# Patient Record
Sex: Female | Born: 1965 | Race: White | Hispanic: No
Health system: Western US, Academic
[De-identification: ages and names within clinical notes are randomized; demographics above are authoritative.]

## PROBLEM LIST (undated history)

## (undated) DIAGNOSIS — IMO0001 Reserved for inherently not codable concepts without codable children: Secondary | ICD-10-CM

## (undated) DIAGNOSIS — M159 Polyosteoarthritis, unspecified: Secondary | ICD-10-CM

## (undated) DIAGNOSIS — F411 Generalized anxiety disorder: Secondary | ICD-10-CM

## (undated) DIAGNOSIS — G35 Multiple sclerosis: Secondary | ICD-10-CM

## (undated) DIAGNOSIS — G4730 Sleep apnea, unspecified: Secondary | ICD-10-CM

## (undated) DIAGNOSIS — K219 Gastro-esophageal reflux disease without esophagitis: Secondary | ICD-10-CM

## (undated) DIAGNOSIS — F32A Depression, unspecified: Secondary | ICD-10-CM

## (undated) DIAGNOSIS — T4145XA Adverse effect of unspecified anesthetic, initial encounter: Secondary | ICD-10-CM

## (undated) DIAGNOSIS — M797 Fibromyalgia: Secondary | ICD-10-CM

## (undated) DIAGNOSIS — J449 Chronic obstructive pulmonary disease, unspecified: Secondary | ICD-10-CM

## (undated) DIAGNOSIS — M1990 Unspecified osteoarthritis, unspecified site: Secondary | ICD-10-CM

## (undated) DIAGNOSIS — G4700 Insomnia, unspecified: Secondary | ICD-10-CM

## (undated) DIAGNOSIS — R413 Other amnesia: Secondary | ICD-10-CM

## (undated) DIAGNOSIS — I2699 Other pulmonary embolism without acute cor pulmonale: Secondary | ICD-10-CM

## (undated) DIAGNOSIS — F419 Anxiety disorder, unspecified: Secondary | ICD-10-CM

## (undated) DIAGNOSIS — T8859XA Other complications of anesthesia, initial encounter: Secondary | ICD-10-CM

## (undated) DIAGNOSIS — Z9889 Other specified postprocedural states: Secondary | ICD-10-CM

## (undated) DIAGNOSIS — F3181 Bipolar II disorder: Secondary | ICD-10-CM

## (undated) DIAGNOSIS — R112 Nausea with vomiting, unspecified: Secondary | ICD-10-CM

## (undated) DIAGNOSIS — K5190 Ulcerative colitis, unspecified, without complications: Secondary | ICD-10-CM

## (undated) HISTORY — PX: SURGICAL HX OTHER: 99

## (undated) HISTORY — PX: PR TONSILLECTOMY ONE-HALF <AGE 12: 42825

## (undated) HISTORY — PX: PR UNLISTED PROCEDURE HANDS/FINGERS: 26989

## (undated) HISTORY — DX: Reserved for inherently not codable concepts without codable children: IMO0001

## (undated) HISTORY — DX: Polyosteoarthritis, unspecified: M15.9

## (undated) HISTORY — PX: CORRJ HALLUX VALGUS W/WO SESMDC SMPL EXOSTECTOMY: 28290

## (undated) HISTORY — DX: Generalized anxiety disorder: F41.1

## (undated) HISTORY — PX: TONSILLECTOMY AND ADENOIDECTOMY: SUR1326

## (undated) HISTORY — DX: Multiple sclerosis: G35

## (undated) HISTORY — PX: FOOT SURGERY: SHX648

## (undated) HISTORY — DX: Anxiety disorder, unspecified: F41.9

## (undated) HISTORY — PX: HUMERUS FRACTURE SURGERY: SHX670

## (undated) HISTORY — DX: Other pulmonary embolism without acute cor pulmonale: I26.99

## (undated) HISTORY — DX: Insomnia, unspecified: G47.00

## (undated) HISTORY — PX: THYROID SURGERY: SHX805

## (undated) HISTORY — PX: NASAL SINUS SURGERY: SHX719

## (undated) HISTORY — PX: THUMB ARTHROSCOPY: SHX2509

## (undated) HISTORY — PX: MYOMECTOMY: SHX85

## (undated) HISTORY — PX: HEMORRHOID SURGERY: SHX153

## (undated) HISTORY — DX: Bipolar II disorder: F31.81

---

## 1998-11-09 DIAGNOSIS — I2699 Other pulmonary embolism without acute cor pulmonale: Secondary | ICD-10-CM

## 1998-11-09 HISTORY — DX: Other pulmonary embolism without acute cor pulmonale: I26.99

## 2006-08-04 ENCOUNTER — Encounter: Payer: No Typology Code available for payment source | Admitting: Rehabilitative and Restorative Service Providers"

## 2006-08-23 ENCOUNTER — Encounter: Payer: No Typology Code available for payment source | Admitting: Rehabilitative and Restorative Service Providers"

## 2006-08-30 ENCOUNTER — Encounter: Payer: Self-pay | Admitting: Rehabilitative and Restorative Service Providers"

## 2006-09-29 ENCOUNTER — Encounter: Payer: Self-pay | Admitting: Rehabilitative and Restorative Service Providers"

## 2006-10-25 ENCOUNTER — Encounter: Payer: Self-pay | Admitting: Rehabilitative and Restorative Service Providers"

## 2006-10-29 ENCOUNTER — Encounter: Payer: No Typology Code available for payment source | Admitting: Rehabilitative and Restorative Service Providers"

## 2006-11-22 ENCOUNTER — Encounter: Payer: No Typology Code available for payment source | Admitting: Rehabilitative and Restorative Service Providers"

## 2006-12-02 ENCOUNTER — Encounter: Payer: No Typology Code available for payment source | Admitting: Rehabilitative and Restorative Service Providers"

## 2006-12-16 ENCOUNTER — Encounter: Payer: Self-pay | Admitting: Rehabilitative and Restorative Service Providers"

## 2006-12-30 ENCOUNTER — Encounter: Payer: Self-pay | Admitting: Rehabilitative and Restorative Service Providers"

## 2007-01-13 ENCOUNTER — Encounter (HOSPITAL_BASED_OUTPATIENT_CLINIC_OR_DEPARTMENT_OTHER): Payer: No Typology Code available for payment source | Admitting: Rehabilitative and Restorative Service Providers"

## 2007-01-27 ENCOUNTER — Encounter (HOSPITAL_BASED_OUTPATIENT_CLINIC_OR_DEPARTMENT_OTHER): Payer: No Typology Code available for payment source | Admitting: Rehabilitative and Restorative Service Providers"

## 2007-02-08 ENCOUNTER — Encounter (HOSPITAL_BASED_OUTPATIENT_CLINIC_OR_DEPARTMENT_OTHER): Payer: No Typology Code available for payment source | Admitting: Rehabilitative and Restorative Service Providers"

## 2007-02-10 ENCOUNTER — Encounter (HOSPITAL_BASED_OUTPATIENT_CLINIC_OR_DEPARTMENT_OTHER): Payer: No Typology Code available for payment source | Admitting: Rehabilitative and Restorative Service Providers"

## 2007-02-24 ENCOUNTER — Encounter (HOSPITAL_BASED_OUTPATIENT_CLINIC_OR_DEPARTMENT_OTHER): Payer: No Typology Code available for payment source | Admitting: Rehabilitative and Restorative Service Providers"

## 2007-03-10 ENCOUNTER — Encounter (HOSPITAL_BASED_OUTPATIENT_CLINIC_OR_DEPARTMENT_OTHER): Payer: No Typology Code available for payment source | Admitting: Rehabilitative and Restorative Service Providers"

## 2007-03-24 ENCOUNTER — Encounter (HOSPITAL_BASED_OUTPATIENT_CLINIC_OR_DEPARTMENT_OTHER): Payer: No Typology Code available for payment source | Admitting: Rehabilitative and Restorative Service Providers"

## 2007-04-07 ENCOUNTER — Encounter (HOSPITAL_BASED_OUTPATIENT_CLINIC_OR_DEPARTMENT_OTHER): Payer: No Typology Code available for payment source | Admitting: Rehabilitative and Restorative Service Providers"

## 2009-12-18 ENCOUNTER — Ambulatory Visit
Payer: Medicaid Other | Attending: Rehabilitative and Restorative Service Providers" | Admitting: Rehabilitative and Restorative Service Providers"

## 2009-12-18 ENCOUNTER — Ambulatory Visit (HOSPITAL_BASED_OUTPATIENT_CLINIC_OR_DEPARTMENT_OTHER): Payer: Medicaid Other | Admitting: Orthopedics Med

## 2009-12-18 ENCOUNTER — Encounter (HOSPITAL_BASED_OUTPATIENT_CLINIC_OR_DEPARTMENT_OTHER): Payer: Self-pay | Admitting: Rehabilitative and Restorative Service Providers"

## 2009-12-18 VITALS — BP 142/85 | HR 87

## 2009-12-18 DIAGNOSIS — S62639A Displaced fracture of distal phalanx of unspecified finger, initial encounter for closed fracture: Secondary | ICD-10-CM | POA: Insufficient documentation

## 2009-12-18 DIAGNOSIS — M79609 Pain in unspecified limb: Secondary | ICD-10-CM | POA: Insufficient documentation

## 2009-12-18 DIAGNOSIS — IMO0002 Reserved for concepts with insufficient information to code with codable children: Secondary | ICD-10-CM | POA: Insufficient documentation

## 2009-12-18 DIAGNOSIS — M19049 Primary osteoarthritis, unspecified hand: Secondary | ICD-10-CM

## 2009-12-18 LAB — PROTHROMBIN & PTT
Partial Thromboplastin Time: 28 s (ref 22–35)
Partial Thromboplastin X Mean: 1
Prothrombin INR: 0.9 (ref 0.8–1.3)
Prothrombin Time Patient: 12.4 s (ref 10.7–15.6)

## 2009-12-18 LAB — BASIC METABOLIC PANEL
Anion Gap: 8 (ref 3–11)
Calcium: 9.8 mg/dL (ref 8.9–10.2)
Carbon Dioxide, Total: 25 mEq/L (ref 22–32)
Chloride: 106 mEq/L (ref 98–108)
Creatinine: 1 mg/dL (ref 0.2–1.1)
GFR, Calc, African American: >60 mL/min (ref >59)
GFR, Calc, European American: >60 mL/min (ref >59)
Glucose: 100 mg/dL (ref 62–125)
Potassium: 4.1 mEq/L (ref 3.7–5.2)
Sodium: 139 mEq/L (ref 136–145)
Urea Nitrogen: 9 mg/dL (ref 8–21)

## 2009-12-18 LAB — CBC (HEMOGRAM)
Hematocrit: 45 % (ref 36–45)
Hemoglobin: 14.5 g/dL (ref 11.5–15.5)
MCH: 27.8 pg (ref 27.3–33.6)
MCHC: 32.3 g/dL (ref 32.2–36.5)
MCV: 86 fL (ref 81–98)
Platelet Count: 331 10*3/uL (ref 150–400)
RBC: 5.22 mil/uL — ABNORMAL HIGH (ref 3.80–5.00)
RDW-CV: 14.2 % (ref 11.6–14.4)
WBC: 10.4 10*3/uL — ABNORMAL HIGH (ref 4.3–10.0)

## 2009-12-18 MED ORDER — HYDROCODONE-ACETAMINOPHEN 5-500 MG OR TABS
ORAL_TABLET | ORAL | Status: DC
Start: 2009-12-18 — End: 2010-03-07

## 2009-12-18 MED ORDER — HYDROCODONE-ACETAMINOPHEN 5-500 MG OR TABS
ORAL_TABLET | ORAL | Status: DC
Start: 2009-12-18 — End: 2009-12-18

## 2009-12-18 NOTE — Progress Notes (Signed)
Chief Complaint   Patient presents with   . Finger Pain     lft RF horse rope injury 11/30/09             Dear Dr. Mardene Speak, Julien Nordmann,    I had the pleasure of seeing your patient, Rebecca Nichols at [facility] today on 12/18/2009 in consultation for evaluation and treatment of left RF pain and fracture.    As you know,  Rebecca Nichols is a 44 year old RHD female who is disabled since 2008.  She presents with a chief complaint of pain in the left ring finger that has been ongoing for 18 days. Patient sustained an injury to the left RF while in [place]. She was leading a horse when the horse lurched and the rope hit her finger.  She went to [facility] where x-rays were taken and a left RF middle phalanx comminuted fracture was found.  The physician there states she would need surgery, but he did not want to do the surgery with her coming back to [place] the next week.  She was splinted and told to follow up here when she returned to [place].  She states she has mild decreased sensation over the ulnar and radial aspect of the left RF.      She also mentions that she is having pain and decreased function with her bilateral thumbs.  She notes that she has had bilateral thumb CMC arthroplasties, then revisions bilateral, as well as bilateral thumb MCP fusions over the past 2 years. Her bilateral thumbs CMC joint has gotten painful again and are not in a position that is functional for the patient.  She is wondering if she can have her left thumb revised if she needs surgery on her left RF.    She currently rates her pain as 8 out of 10 that is Throbbing, Stabbing, Sharp and Dull. Pain is aggravated by activity, cold, and pressure. The pain has come on suddenly.  Sensation is mildly decreased in the left RF.  She notes a decrease in strength . She currently rates her strength as 1 out of 10.     Past Medical History:  Past Medical History   Diagnosis Date   .  ANXIETY STATE NOS    . MYALGIA AND MYOSITIS NOS    . PSORIASIS/LIKE DISORDERS*    . GENERAL OSTEOARTHROSIS          Medications:     Current outpatient prescriptions   Medication Sig   . ALPRAZolam 0.25 MG OR TABS prn   . Aripiprazole (ABILIFY) 10 MG OR TABS 1qd   . Glatiramer Acetate (COPAXONE) 20 MG/ML SC KIT daily   . HYDROCODONE-APAP (VICODIN) 5-500 MG OR TABS one tablet p.o. q 4-6 hours prn for pain   . Ibuprofen 800 MG OR TABS tid   . Mesalamine (ASACOL) 400 MG OR TBEC 3 tabs  tid   . Oxycodone-Acetaminophen 10-325 MG OR TABS prn   . Pregabalin (LYRICA OR) unknown dose qd   . TraZODone HCl 50 MG OR TABS prn           Allergies:   Review of patient's allergies indicates:  Allergies   Allergen Reactions   . Papaya And Enzymes Throat Swelling           Past Surgical History:  Past Surgical History   Procedure Date   . Removal of tonsils    .  Bilateral thumb cmc arthroplasties    . Fibroid cysts    . Correct bunion,simple          Family History:  Non-contributory    Social History:   The patient states her problem is not work related.  She is divorced.     History   Alcohol Use   . Yes     occasional         History   Tobacco Use   . Yes -- 0.5 packs/day for 20 years           ROS:   Positive for   Muscle/Bones:  joint pain, muscle pain and fractures  Skin:  psoriasis  Mental Health:  anxiety  . All other systems of the 14 reviewed are negative.     Hand & Upper Extremity Examination    Physical Examination  Gen: Patient is healthy, alert, no distress    Psych: Alert and oriented times 3  Pleasant female. Mood and affect appropriate.    Skin:   warm, normal  sweat patterns.  no abrasions or lacerations.  ecchymosis over left RF and into dorsal left hand    No palpable masses    Vascular: Fingers warm, pink, with brisk capillary refill    Neurologic: Sensation to light touch is decreased over the left ring finger tip.    Musculoskeletal:    Inspection of the  bilateral upper extremities shows no gross deformity or  evidence of atrophy.  Mild swelling over the left RF into dorsal hand.  Tender over left RF middle phalanx, left thumb CMC joint, and right thumb CMC joint.  Crepitus over [facility] joint  bilateral thumb.  Positive CMC grind  bilateral thumb.  Bilateral thumb have a adducted posture to them at rest.  Patient unable to full abduct the thumb.    Symmetric full wrist flexion/extension  Mild radial scissoring noted with left RF  Bilateral thumbs in adducted position.  Full extension and opposition of bilateral thumbs.  No DRUJ instability.  5/5 strength WE, WF      Studies: X-rays of the left RF were reviewed and show a middle phalanx comminuted spiral fracture with shortening and mild volar displacement.      Assessment:   1. Left RF middle phalanx comminuted spiral fracture  2. 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    Plan:  Patient was told that with her comminution and scissoring seen we are recommending CRPP vs ORIF of her left RF middle phalanx.  She agreed with the plan and inquired about possibly revising her left thumb CMC arthroplasty at the same time.  She was told the splinting and therapy protocols for those two procedures are very different and Dr. Renaldo Reel would probably not want to do both at the same time.  She was told I would run it past him and we could add it on day of surgery if he wanted to do so.  She understood and is willing to proceed.  The risks and benefits of surgery were explained to the patient including potential complications that include but are not limited to bleeding, infection, wound problems, neurovascular injury, nonunion, malunion, hardware failure, stiffness, swelling, posttraumatic arthritis,  possible need for revision surgery, and perioperative cardiac and pulmonary complications. Informed consent was obtained. Patient will work with my surgical scheduler to arrange a date for surgery.    Patient was placed in a short arm ulnar  gutter cast in intrinsic  plus position until the day of surgery.  She was also given Vicodin #20 to control pain until the day of surgery.    Thank you for allowing [place] to participate in the care of your patient.    Sincerely,    Santiago Bur, PA-C, ATC  [facility] Medicine  Orthopaedics & Sports Medicine  Hand and Upper Extremity Surgery

## 2009-12-19 ENCOUNTER — Encounter (HOSPITAL_BASED_OUTPATIENT_CLINIC_OR_DEPARTMENT_OTHER): Payer: Self-pay | Admitting: Rehabilitative and Restorative Service Providers"

## 2009-12-19 ENCOUNTER — Ambulatory Visit: Payer: Medicaid Other | Attending: Hand Surgery

## 2009-12-19 ENCOUNTER — Other Ambulatory Visit (HOSPITAL_BASED_OUTPATIENT_CLINIC_OR_DEPARTMENT_OTHER): Payer: Self-pay | Admitting: Hand Surgery

## 2009-12-19 DIAGNOSIS — IMO0002 Reserved for concepts with insufficient information to code with codable children: Secondary | ICD-10-CM | POA: Insufficient documentation

## 2009-12-19 LAB — PR X-RAY EXAM OF FINGER(S)

## 2009-12-20 LAB — PR X-RAY EXAM OF FINGER(S)

## 2009-12-31 ENCOUNTER — Ambulatory Visit: Payer: Medicaid Other | Attending: Hand Surgery | Admitting: Hand Surgery

## 2009-12-31 ENCOUNTER — Ambulatory Visit (HOSPITAL_BASED_OUTPATIENT_CLINIC_OR_DEPARTMENT_OTHER): Payer: Medicaid Other | Admitting: Orthopedics Med

## 2009-12-31 VITALS — BP 121/85 | HR 125

## 2009-12-31 DIAGNOSIS — M79609 Pain in unspecified limb: Secondary | ICD-10-CM | POA: Insufficient documentation

## 2009-12-31 DIAGNOSIS — S62609A Fracture of unspecified phalanx of unspecified finger, initial encounter for closed fracture: Secondary | ICD-10-CM | POA: Insufficient documentation

## 2009-12-31 LAB — PR X-RAY EXAM OF FINGER(S)

## 2009-12-31 MED ORDER — HYDROCODONE-ACETAMINOPHEN 5-500 MG OR TABS
ORAL_TABLET | ORAL | Status: DC
Start: 2009-12-31 — End: 2010-03-07

## 2009-12-31 NOTE — Progress Notes (Signed)
Rebecca Nichols is a 44 year old female who is now 2 weeks s/p CRPP left RF middle phalanx fracture that was performed on 12/19/09.  She currently rates her pain as 3 out of 10. She denies any numbness in the left RF. Denies erythema or purulent drainage.  She states she has been having a mild fever for the past 48 hours, but admits to being around "sick people" lately.  She does have mild decreased sensation over the RF.      EXAM  Pin tracts are clean and dry, intact, no dehiscence, well-healed.  Sensation to light touch is mildly decreased over the ulnar and radial aspects of the left RF, however, sensation is present to light touch.    STUDIES  X-rays of the left RF show pin fixation of her left RF comminuted middle phalanx fracture.  No change in alignment. No evidence of hardware loosening.    IMPRESSION  2 weeks s/p CRPP left RF middle phalanx fracture, progressing well.    PLAN    Cast:  short arm ulnar gutter in intrinsic plus position  X-rays of left RF at next visit  Follow-up in clinic in 2 weeks  Patient was given a prescription for Vicodin #40 at today's visit.  Plan on pin removal at next visit.  Patient may call to schedule surgical pin removal if she prefers to have this performed in the OR.    Dr. Renaldo Reel saw the patient, evaluated the patient, and formulated the plan to his satisfaction.    Santiago Bur, PA-C, ATC  [facility] Medicine  Orthopaedics & Sports Medicine  Hand and Upper Extremity Surgery

## 2010-01-07 ENCOUNTER — Encounter (HOSPITAL_BASED_OUTPATIENT_CLINIC_OR_DEPARTMENT_OTHER): Payer: Medicaid Other | Admitting: Hand Surgery

## 2010-01-14 ENCOUNTER — Ambulatory Visit: Payer: Self-pay | Admitting: Rehabilitative and Restorative Service Providers"

## 2010-01-14 ENCOUNTER — Ambulatory Visit
Payer: Medicaid Other | Attending: Rehabilitative and Restorative Service Providers" | Admitting: Rehabilitative and Restorative Service Providers"

## 2010-01-14 VITALS — BP 121/70 | HR 115

## 2010-01-14 DIAGNOSIS — M79609 Pain in unspecified limb: Secondary | ICD-10-CM | POA: Insufficient documentation

## 2010-01-14 DIAGNOSIS — IMO0001 Reserved for inherently not codable concepts without codable children: Secondary | ICD-10-CM

## 2010-01-14 DIAGNOSIS — M25649 Stiffness of unspecified hand, not elsewhere classified: Secondary | ICD-10-CM

## 2010-01-14 DIAGNOSIS — S5290XD Unspecified fracture of unspecified forearm, subsequent encounter for closed fracture with routine healing: Secondary | ICD-10-CM

## 2010-01-14 DIAGNOSIS — S62609A Fracture of unspecified phalanx of unspecified finger, initial encounter for closed fracture: Secondary | ICD-10-CM

## 2010-01-14 DIAGNOSIS — M25549 Pain in joints of unspecified hand: Secondary | ICD-10-CM
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
## 2010-01-15 NOTE — Progress Notes (Signed)
Rebecca Nichols is a 44 year old female who is now 4 weeks s/p CRPP left RF middle phalanx fracture that was performed on 12/19/09.  She currently rates her pain as 2 out of 10. She denies any numbness in the left MF. Denies any fevers or chills and Denies erythema or purulent drainage.  She is a little concerned that the finger appears to be still a little rotated.     EXAM  Pin sites are clean and dry, intact  Sensation to light touch is intact over the median, radial, and ulnar nerve distribution.    STUDIES  X-rays of the left RF show 3 k-wires spanning across the left RF comminuted middle phalanx fracture with increased progression to fracture healing with some callous formation seen.  No change in alignment. No evidence of hardware loosening.    IMPRESSION  4 weeks s/p CRPP left RF middle phalanx fracture, progressing well.    PLAN    Referral to hand therapy for edema control, fabrication of an ulnar gutter splint in intrinsic plus position, and to begin AROM only in 2 weeks.  Ulnar gutter splint full-time except for showering and therapy.  X-rays of left RF at next visit  X-rays of bilateral thumbs at next visit  Follow-up in clinic in 4 weeks  Patient wanted to speak about her bilateral thumbs, but was told to wait until the next clinic visit as we wanted to have the RF heal before discussing treatments for the thumbs.  She understood and was happy with this.  K-wires were removed today as described in the procedure note below.    Procedure Note:  After verifying name, birthday, and location, 10 cc of lidocaine 1% was injected to form a digital block of her left RF.  Alcohol and an 18 gauge needle was used to expose the 3 k-wires.  A needle driver was used to extract the k-wires.  The three pin tracts were then bandaged with a bulky dressing.  Patient tolerated procedure without complication.        Santiago Bur, PA-C, ATC  [facility]  Orthopaedics & Sports Medicine  Hand and Upper Extremity  Surgery    CC:  Dwaine Deter, MD  [facility] [address]  [place], [place] [address]

## 2010-01-23 ENCOUNTER — Ambulatory Visit: Payer: Self-pay | Attending: Physical Medicine & Rehabilitation | Admitting: Physical Medicine & Rehabilitation

## 2010-01-23 DIAGNOSIS — IMO0001 Reserved for inherently not codable concepts without codable children: Secondary | ICD-10-CM

## 2010-01-23 DIAGNOSIS — G35 Multiple sclerosis: Secondary | ICD-10-CM

## 2010-01-23 DIAGNOSIS — M79609 Pain in unspecified limb: Secondary | ICD-10-CM

## 2010-02-07 ENCOUNTER — Encounter (HOSPITAL_BASED_OUTPATIENT_CLINIC_OR_DEPARTMENT_OTHER): Payer: Self-pay | Admitting: Physical Medicine & Rehabilitation

## 2010-02-07 ENCOUNTER — Ambulatory Visit: Payer: Self-pay | Attending: Physical Medicine & Rehabilitation | Admitting: Physical Medicine & Rehabilitation

## 2010-02-07 DIAGNOSIS — IMO0001 Reserved for inherently not codable concepts without codable children: Secondary | ICD-10-CM

## 2010-02-11 ENCOUNTER — Encounter (HOSPITAL_BASED_OUTPATIENT_CLINIC_OR_DEPARTMENT_OTHER): Payer: Medicaid Other | Admitting: Hand Surgery

## 2010-03-05 NOTE — Progress Notes (Addendum)
Addended by: HOLMBERG, KARIN S on: 03/05/2010      Modules accepted: Orders

## 2010-03-05 NOTE — Progress Notes (Signed)
I received casting orders from [facility] for a short arm cast with intrinsic plus position. Pt tolerated application of cast well.  I reviewed CNS and hygiene protocol with patient.

## 2010-03-07 ENCOUNTER — Ambulatory Visit: Payer: Self-pay | Admitting: Rehabilitative and Restorative Service Providers"

## 2010-03-07 ENCOUNTER — Ambulatory Visit: Payer: Self-pay | Attending: Hand Surgery | Admitting: Hand Surgery

## 2010-03-07 VITALS — BP 118/71 | HR 96 | Ht 68.7 in | Wt 193.6 lb

## 2010-03-07 DIAGNOSIS — IMO0001 Reserved for inherently not codable concepts without codable children: Secondary | ICD-10-CM

## 2010-03-07 DIAGNOSIS — M19049 Primary osteoarthritis, unspecified hand: Secondary | ICD-10-CM

## 2010-03-07 DIAGNOSIS — M25549 Pain in joints of unspecified hand: Secondary | ICD-10-CM

## 2010-03-07 DIAGNOSIS — M79609 Pain in unspecified limb: Secondary | ICD-10-CM

## 2010-03-07 DIAGNOSIS — M20019 Mallet finger of unspecified finger(s): Secondary | ICD-10-CM

## 2010-03-07 DIAGNOSIS — S5290XD Unspecified fracture of unspecified forearm, subsequent encounter for closed fracture with routine healing: Secondary | ICD-10-CM

## 2010-03-07 DIAGNOSIS — M25649 Stiffness of unspecified hand, not elsewhere classified: Secondary | ICD-10-CM

## 2010-03-07 DIAGNOSIS — Z96698 Presence of other orthopedic joint implants: Secondary | ICD-10-CM

## 2010-03-07 NOTE — Progress Notes (Signed)
Pt tolerated application of short arm ulnar gutter cast for s/p CRPP left RF middle phalanx fracture that was performed on 12/19/09.  I reviewed CNS and hygiene protocol with patient.

## 2010-03-07 NOTE — Progress Notes (Signed)
Occupational Therapy Progress Note    Today's date: 03/07/2010  Time in: 11:00   Time out: 11:30   Duration: 30 minutes   Date of initial note for this episode of care: 01/14/2010  Diagnosis:  Left ring finger second phalanx fracture with CRPP, with pin removal about a month ago and residual extensor lag.     Patient Identifiers:  The following patient identifiers were confirmed with the patient: name and date of birth.    PAIN: The patient is reporting 0-1/10 level of pain, on a 0-10 Numeric Pain Distress Scale.     Skilled services/interventions provided: orthotic management for 30 minutes   Received new orders to make a DIP joint extension orthosis for night-time use.  Fabricated a DIP extension orthosis with the ability to slightly hyperextend digit.  Patient instructed in how to apply the splint with light stretch and to use of coban wrap to help keep it in place.  Patient instructed to wear the orthosis occasionally during the day if she notices a lag and at night and to continue use of it for the next 3 months.  Patient given an LMB splint for the DIP joint to use as an alternate if she loses her current orthosis.  Emphasized maintaining the DIP joint in extension and patient educated about her anatomy.    Patient education/instruction: Patient instructed in orthosis use as above.    Patient's response to therapy: Patient verbalized understanding of her home program.    Change in impairments: Ring finger DIP ext: -12 degrees.    Long term functional goals:  (Hand)  Open a jar lid with involved hand within 12 weeks.    Functional improvement/progress toward each goal: Patient is able to use hand with all activities now and she reports that she has been having to lift a lot of boxes due to moving lately.  Patient will wear the extension splint to prevent further lag when doing tight gripping activities.  Patient has been compliant with her home program.    Functional limitation(s) remaining requiring continued  skilled services: Patient is limited with gripping activities while wearing her orthosis, otherwise she is able to use her hand normally.    Any changes in plan of treatment: Patient will follow up for splint adjustment as needed.    The plan of care has been reviewed with the supervising occupational therapist in a face-to-face meeting.

## 2010-03-07 NOTE — Progress Notes (Signed)
Rebecca Nichols is a 44 year old female who is here for follow-up of her bilateral thumb pain.  She has had a total of 3 surgeries for each thumb CMC joint consisting of a tendon interposition followed by cadaveric reconstructions in [place]. She presents with increasing pain, weakness, and deformity in her bilateral thumbs. She is now 2.5 months s/p CRPP L RF middle phalanx on 12/19/2009.    Patient currently rates her pain as 2/10 in her left ring finger that is Aching. Sensation is normal in the left ring finger.  She notes a decrease in strength in her left hand.    ROS:   No new skin, neurologic, or musculoskeletal problems       Physical Examination  Gen: Patient is healthy, alert, no distress    Psych: Alert and oriented times 3  Pleasant female. Mood and affect appropriate.    Skin: warm, normal color and sweat patterns.  No palpable masses    Vascular: Fingers warm, pink, with brisk capillary refill    Neurologic: Sensation to light touch grossly intact over the median, radial, and ulnar distributions    Musculoskeletal:    Moderate swelling over the bilateral thumb CMC joints, left greater than right.  Tender over bilateral thumb CMC joints, left greater than right  Crepitus over L thumb CMC joint with severe on CMC grind.  Full composite flexion L ring finger  10 degree extensor lag L RF DIP joint    Studies  X-rays of the bilateral thumbs were reviewed and show prior trapeziectomy with subsidence. 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 Prior thumb MCP fusions with K-wires in place. There appears to be contact between base of thumb metacarpal and scaphoid in the left thumb.  Radiographs of the left ring finger show healing L RF middle phalanx in satisfactory alignment    Impression  History of bilateral revision thumb CMC arthroplasty with subsidence, left > right  2.5 months s/p CRPP L RF middle phalanx with persistent extensor lag    Plan  Therapy for extension splint DIP joint L RF at night and  ROM exercises  Mr. Hamblin is considering having a revision L thumb CMC arthroplasty to address the subsidence and severe pain. She would like to have this done in the Fall and will plan on following up then.    Azucena Fallen, MD  [facility]  Department of Orthopaedics and Sports Medicine  Hand, Wrist, and Elbow Surgery  [phone])      CC:  Dwaine Deter, MD  [facility] [address]  [place], [place] [address]

## 2010-03-07 NOTE — Progress Notes (Addendum)
Addended by: Threasa Heads on: 03/07/2010      Modules accepted: Orders

## 2010-03-09 LAB — PR X-RAY EXAM OF FINGER(S)

## 2010-03-31 ENCOUNTER — Ambulatory Visit: Payer: Self-pay | Attending: Physical Medicine & Rehabilitation | Admitting: Physical Medicine & Rehabilitation

## 2010-03-31 ENCOUNTER — Ambulatory Visit: Payer: Self-pay | Admitting: Rehabilitative and Restorative Service Providers"

## 2010-03-31 DIAGNOSIS — R52 Pain, unspecified: Secondary | ICD-10-CM

## 2010-03-31 DIAGNOSIS — IMO0001 Reserved for inherently not codable concepts without codable children: Secondary | ICD-10-CM

## 2010-03-31 DIAGNOSIS — M25549 Pain in joints of unspecified hand: Secondary | ICD-10-CM

## 2010-03-31 DIAGNOSIS — M1990 Unspecified osteoarthritis, unspecified site: Secondary | ICD-10-CM

## 2010-03-31 NOTE — Progress Notes (Addendum)
Occupational Therapy Hand Clinic Note: [facility], [facility]    Time In: 12:45   Time Out: 1:30   Duration: 45 minutes  Date of onset: 1st surgery was about 6 years ago  Patient Name: Rebecca Nichols  The following patient identifiers were confirmed with the patient: name and date of birth.  Referring M.D:  Cristela Felt, M.D.    Diagnosis: Bilateral thumb CMC joint arthritis (Hx of 3 surgeries on each thumb with MCP fusions)    History: Patient reports that she has had a total of 6 surgeries on her thumbs for arthritis and she currently has a lot of pain in both thumbs.  Please see MD note for more details regarding medical history.  Patient is not currently working, but has used computers frequently in the past when she worked at [employer].  Patient reports that she is right handed and enjoys working with horses, riding her motorcycle, and gardening.  Patient presented with some evident swelling in the right thumb and palm.  Received orders to make bilateral hand based orthoses and to treat the patient 1 time a week for 4 weeks.    Fall risk: not fallen in the past year.  Barriers to learning: none  Preferred learning style: demonstration, written and verbal  Cultural Practices Influencing care: none    Level of function at start of care: Patient is having pain with any gripping or pinching activity, such as when grasping her clutch on her handlebar while riding her motorcycle, with gardening, and when opening a jar.  Patient also has pain when texting on her phone.    Skilled Services/Interventions provided:  Bilateral hand based orthoses: 45 minutes     Discussed the patient's history and patient educated about which activities during her day would be the most aggravating.  Fabricated bilateral hand based orthoses to comfort and patient instructed in the care and use of these.  Patient fitted with neoprene comfort cool thumb braces also to use with riding her motorcycle and other light  activities in general.  Patient instructed in the use of heat to decrease her pain.      Current Impairments:  Pain: The patient is reporting 8/10 level of pain, on a 0-10 Numeric Pain Distress Scale, without pain medication.  Range of Motion: Not taken due to pain.  Sensation: normal    Patient's participation in home program:   Patient verbalizes comprehension of the use of the orthoses, and was able to don and doff them independently and correctly.    GOALS:    Long term functional goal(s): Be able to garden and grasp clutch on motorcycle handlebar without pain in 12 weeks.    These goals were discussed and the patient agrees with them.  Rehab potential: Good    PLAN:   Frequency/Duration:  1 time a week for 4 weeks.  The therapy will include: use of modalities to decrease pain and splint adjustment.    The plan of care has been reviewed with the supervising occupational therapist in a face-to-face meeting.

## 2010-04-10 ENCOUNTER — Ambulatory Visit (HOSPITAL_BASED_OUTPATIENT_CLINIC_OR_DEPARTMENT_OTHER): Payer: Self-pay | Admitting: Rehabilitative and Restorative Service Providers"

## 2010-04-10 NOTE — Progress Notes (Signed)
Patient did not come to her occupational therapy appointment.

## 2010-05-30 ENCOUNTER — Encounter (HOSPITAL_BASED_OUTPATIENT_CLINIC_OR_DEPARTMENT_OTHER): Payer: Self-pay | Admitting: Hand Surgery

## 2010-05-30 ENCOUNTER — Ambulatory Visit: Payer: Self-pay | Attending: Hand Surgery | Admitting: Hand Surgery

## 2010-05-30 VITALS — HR 110

## 2010-05-30 DIAGNOSIS — S62609A Fracture of unspecified phalanx of unspecified finger, initial encounter for closed fracture: Secondary | ICD-10-CM

## 2010-05-30 DIAGNOSIS — M79609 Pain in unspecified limb: Secondary | ICD-10-CM

## 2010-05-30 DIAGNOSIS — M19049 Primary osteoarthritis, unspecified hand: Secondary | ICD-10-CM

## 2010-05-30 DIAGNOSIS — Z96698 Presence of other orthopedic joint implants: Secondary | ICD-10-CM

## 2010-05-30 DIAGNOSIS — M20019 Mallet finger of unspecified finger(s): Secondary | ICD-10-CM

## 2010-05-30 LAB — PR X-RAY EXAM OF FINGER(S)

## 2010-05-30 LAB — PR X-RAY HAND 3+ VW

## 2010-05-30 NOTE — Progress Notes (Signed)
Rebecca Nichols is a 44 year old female who is here for follow-up of CRPP L RF middle phalanx, 5 months post op. 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    Patient currently rates her pain as 5/10 in her left hand that is Aching. Sensation is normal in the hand.  She notes no change in strength in her right hand. She complains of an inability to fully extend the DIP of her L RF. She has been wearing the extension splint she was given after her last visit on a daily basis. She does not feel that it has helped much.      ROS:   No new skin, neurologic, or musculoskeletal problems       Physical Examination  Gen: Patient is healthy, alert, no distress    Psych: Alert and oriented times 3  Pleasant female. Mood and affect appropriate.    Skin:   warm, normal color and sweat patterns.  no abrasions, lacerations, or ecchymosis.    No palpable masses    Vascular: Fingers warm, pink, with brisk capillary refill    Neurologic: Sensation to light touch grossly intact over the median, radial, and ulnar distributions    Musculoskeletal:    Inspection of the  left upper extremity shows no gross deformity or evidence of atrophy.  She has 15 extensor lag of the DIP of the RF, with full PROM from 0 to 100 degrees    5/5 strength WE, WF, FE, FF, grip  5/5 strength APB, intrinsics    TTP at the insertion of the terminal tendon of RF.      Studies  X-rays of the L hand were reviewed and show extensor lag of the DIP as decribed above. There is impaction of the proximal base of the thumb MC to the trapezoid. She has undergone trapezium excision in the past.    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 Her P2 has united in near anatomic alignment with about 5 degrees of extension deformity. We advise her to continue to use the splint. However, we discussed with her that open reduction would have likely lead to more stiffness and would not have improved her extension. She expresses regret in having chosen CRPP because she thinks that this is what caused her to have an extensor lag. It is possible that she sustained a mallet finger type injury at the time of her P2 fracture also. The splint will help this injury heal. There is no bony mallet injury.    In regards to her thumb base, we have previously discussed revision CMC arthoplasty with her. She wants to wait until after the summer to pursue surgery. We will see her again after the summer.    Terrance Mass, MD  Resident, Dept of Orthopaedics and Sports Medicine  [facility]    ATTENDING STATEMENT:    I personally evaluated and examined the patient above, and agree with the findings, impression, and plan outlined above.   Rebecca Nichols is a 44 year old year old female who presents 5 months s/p CRPP L  RF middle phalanx. She is doing well but is concerned about a persistent extensor lag DIP joint L RF. She also has persistent disability related to subsidence and failure of revision L thumb CMC arthroplasty . On exam, she has full composite flexion L RF. Extensor lag 15 degrees DIP joint L RF with full passive extension. Tender over dorsal aspect DIP joint.  The patient's history and exam are consistent with healed L RF P2 fracture with likely concomitant mallet injury.  Treatment options were reviewed with the patient including nonoperative vs. surgical treatment. We recommend continued extension splinting for this. She may have a persistent lag of 10-15 degrees over time. At this time, she would like to consider a revision L thumb CMC arthroplasty sometime in October.    Azucena Fallen, MD  [facility]  Department of Orthopaedics and Sports Medicine  Hand, Wrist, and Elbow Surgery

## 2010-08-20 ENCOUNTER — Telehealth (HOSPITAL_BASED_OUTPATIENT_CLINIC_OR_DEPARTMENT_OTHER): Payer: Self-pay | Admitting: Hand Surgery

## 2010-08-20 NOTE — Telephone Encounter (Signed)
Junious Dresser RN/ [place],    Patient would like to proceed with surgery.  Please call and inform patient as to how to begin the process.  Thank you!    Homero Fellers

## 2010-09-05 ENCOUNTER — Ambulatory Visit: Payer: Self-pay | Attending: Hand Surgery | Admitting: Hand Surgery

## 2010-09-05 ENCOUNTER — Encounter (HOSPITAL_BASED_OUTPATIENT_CLINIC_OR_DEPARTMENT_OTHER): Payer: Self-pay | Admitting: Hand Surgery

## 2010-09-05 VITALS — BP 113/74 | HR 123 | Ht 68.0 in | Wt 198.7 lb

## 2010-09-05 DIAGNOSIS — M20019 Mallet finger of unspecified finger(s): Secondary | ICD-10-CM

## 2010-09-05 DIAGNOSIS — M24849 Other specific joint derangements of unspecified hand, not elsewhere classified: Secondary | ICD-10-CM

## 2010-09-05 DIAGNOSIS — M25549 Pain in joints of unspecified hand: Secondary | ICD-10-CM

## 2010-09-05 LAB — PROTHROMBIN & PTT
Partial Thromboplastin Time: 30 s (ref 22–35)
Prothrombin INR: 1 (ref 0.8–1.3)
Prothrombin Time Patient: 13 s (ref 10.7–15.6)

## 2010-09-05 LAB — CBC (HEMOGRAM)
Hematocrit: 42 % (ref 36–45)
Hemoglobin: 14.4 g/dL (ref 11.5–15.5)
MCH: 28.7 pg (ref 27.3–33.6)
MCHC: 34.2 g/dL (ref 32.2–36.5)
MCV: 84 fL (ref 81–98)
Platelet Count: 269 10*3/uL (ref 150–400)
RBC: 5.01 mil/uL — ABNORMAL HIGH (ref 3.80–5.00)
RDW-CV: 13.5 % (ref 11.6–14.4)
WBC: 15.36 10*3/uL — ABNORMAL HIGH (ref 4.3–10.0)

## 2010-09-05 LAB — BASIC METABOLIC PANEL
Anion Gap: 7 (ref 3–11)
Calcium: 9.6 mg/dL (ref 8.9–10.2)
Carbon Dioxide, Total: 25 mEq/L (ref 22–32)
Chloride: 103 mEq/L (ref 98–108)
Creatinine: 0.94 mg/dL (ref 0.38–1.02)
GFR, Calc, African American: >60 mL/min (ref >59)
GFR, Calc, European American: >60 mL/min (ref >59)
Glucose: 93 mg/dL (ref 62–125)
Potassium: 3.8 mEq/L (ref 3.7–5.2)
Sodium: 135 mEq/L — ABNORMAL LOW (ref 136–145)
Urea Nitrogen: 10 mg/dL (ref 8–21)

## 2010-09-05 NOTE — Progress Notes (Signed)
Preoperative instruction given per [place] protocol. Literature for Durable Power of atty, Healthcare Directive, Pt rights and Responsibilities,  About Your Surgery booklet, and post-op instruction given to pt.  Stressed NPO status preop, ruling out sips of water, ice, etc. Call if questions.

## 2010-09-06 DIAGNOSIS — M24849 Other specific joint derangements of unspecified hand, not elsewhere classified: Secondary | ICD-10-CM | POA: Insufficient documentation

## 2010-09-06 NOTE — Progress Notes (Signed)
Rebecca Nichols is a 44 year old female who is here for follow-up of her left RF and left thumb.  She is now 8 months s/p CRPP left RF middle phalanx fracture.  She states she is concerned with the extensor lag of the left RF distal phalanx.  At her last visit on 05/30/10 she was told to use a splint to help the extensor lag.  Patient admits to only using the splint occasionally.  She would also like to discuss possible treatment options for her left thumb CMC laxity and dysfunction.  She has had several CMC surgeries in the past including an arthroplasty with FCR interposition and an APL suspensionplasty.  She continues to have CMC laxity, pain, and decreased strength.  She denies any numbness or tingling in the left hand.    Patient currently rates her pain as 5/10 in her left thumb that is Aching. Sensation is normal in the left hand.  She notes a decrease in strength in her left thumb.      Social History: Smokes regularly    ROS:   No new skin, neurologic, or musculoskeletal problems       Physical Examination  Gen: Patient is healthy, alert, no distress    Psych: Alert and oriented times 3  Pleasant female. Mood and affect appropriate.    Skin: warm, normal color and sweat patterns.  no abrasions, lacerations, or ecchymosis.    Vascular: Fingers warm, pink, with brisk capillary refill    Neurologic: Sensation to light touch grossly intact over the median, radial, and ulnar distributions  Sensation to light touch intact over radial/ulnar aspect of left RF    Musculoskeletal:  Inspection of the  left upper extremity shows no gross deformity or evidence of atrophy.  No swelling over the left RF or left thumb.  Tender over left thumb CMC joint.  Crepitus over [facility] joint  left thumb.  No palpable masses  Symmetric full wrist flexion/extension  Extensor lag left ring finger DIP joint of 5 degrees  No radial or ulnar instability over left RF DIP joint  Instability felt with left thumb CMC joint in all directions.  5/5  strength WE, WF, FE, FF, grip  5/5 strength APB, intrinsics    Studies  Not applicable    Impression  1. 8 months s/p CRPP left RF P2, with continued extensor lag  2. 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  At this point 8 months out it may be difficult to correct the extensor lag and surgery could make it worse.  We are recommending splinting 8 weeks straight to attempt to correct the mallet finger.  She also wants to proceed with surgery on her left thumb CMC joint.  We are recommending a revision left thumb CMC arthroplasty with semitendinous allograft.  She would like to proceed.  The risks and benefits of surgery were explained to the patient including potential complications that include but are not limited to bleeding, infection, wound problems, neurovascular injury, stiffness, swelling, possible need for revision surgery, and perioperative cardiac and pulmonary complications. Informed consent was obtained. Patient will work with my surgical scheduler to arrange a date for surgery.   She would like to hold off on the mallet finger splinting until after surgery so she can wear her  splints at the same time.    Dr. Renaldo Reel saw the patient, evaluated the patient, and formulated the plan to his satisfaction.    Santiago Bur, PA-C, ATC  [facility]  Orthopaedics & Sports Medicine  Hand and Upper Extremity Surgery     ATTENDING STATEMENT:    I personally evaluated and examined the patient above, and agree with the findings, impression, and plan outlined above.     Rebecca Nichols is a 44 year old female who presents for follow-up of her L RF and L thumb. She has now 8 months s/p L RF P2 CRPP and doing well overall. She continues to have an extensor lag over DIP joint that has not improved. She comes in today mainly to discuss surgery for her L  thumb. She has had multiple revision surgeries for her thumb CMC joint.  On exam, she has significant laxity and tenderness over the thumb CMC joint.  Rebecca Nichols would like to proceed with revision L thumb CMC arthroplasty. We will plan on using semitendinous allograft to reconstruct the intermetacarpal ligament to stabilize the thumb and provide interposition spacer for the trapezial space.    Azucena Fallen, MD  [facility]  Department of Orthopaedics and Sports Medicine  Hand, Wrist, and Elbow Surgery

## 2010-09-15 ENCOUNTER — Encounter (HOSPITAL_BASED_OUTPATIENT_CLINIC_OR_DEPARTMENT_OTHER): Payer: Self-pay | Admitting: Hand Surgery

## 2010-09-30 ENCOUNTER — Ambulatory Visit (HOSPITAL_BASED_OUTPATIENT_CLINIC_OR_DEPARTMENT_OTHER): Payer: Self-pay

## 2010-10-08 ENCOUNTER — Ambulatory Visit: Payer: Self-pay | Attending: Hand Surgery

## 2010-10-08 ENCOUNTER — Other Ambulatory Visit (HOSPITAL_BASED_OUTPATIENT_CLINIC_OR_DEPARTMENT_OTHER): Payer: Self-pay | Admitting: Hand Surgery

## 2010-10-08 ENCOUNTER — Ambulatory Visit: Payer: Self-pay | Admitting: Rehabilitative and Restorative Service Providers"

## 2010-10-08 DIAGNOSIS — IMO0001 Reserved for inherently not codable concepts without codable children: Secondary | ICD-10-CM

## 2010-10-08 DIAGNOSIS — Z96698 Presence of other orthopedic joint implants: Secondary | ICD-10-CM

## 2010-10-08 DIAGNOSIS — M20019 Mallet finger of unspecified finger(s): Secondary | ICD-10-CM

## 2010-10-08 DIAGNOSIS — T84498A Other mechanical complication of other internal orthopedic devices, implants and grafts, initial encounter: Secondary | ICD-10-CM

## 2010-10-08 DIAGNOSIS — T84099A Other mechanical complication of unspecified internal joint prosthesis, initial encounter: Secondary | ICD-10-CM | POA: Insufficient documentation

## 2010-10-08 DIAGNOSIS — Z471 Aftercare following joint replacement surgery: Secondary | ICD-10-CM

## 2010-10-08 DIAGNOSIS — M25549 Pain in joints of unspecified hand: Secondary | ICD-10-CM

## 2010-10-08 DIAGNOSIS — M24849 Other specific joint derangements of unspecified hand, not elsewhere classified: Secondary | ICD-10-CM

## 2010-10-08 LAB — PR X-RAY EXAM OF FINGER(S)
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
## 2010-10-21 ENCOUNTER — Ambulatory Visit: Payer: Self-pay | Attending: Hand Surgery | Admitting: Hand Surgery

## 2010-10-21 ENCOUNTER — Encounter (HOSPITAL_BASED_OUTPATIENT_CLINIC_OR_DEPARTMENT_OTHER): Payer: Self-pay | Admitting: Hand Surgery

## 2010-10-21 VITALS — BP 125/79 | HR 80

## 2010-10-21 DIAGNOSIS — M19049 Primary osteoarthritis, unspecified hand: Secondary | ICD-10-CM

## 2010-10-21 DIAGNOSIS — Z96698 Presence of other orthopedic joint implants: Secondary | ICD-10-CM

## 2010-10-21 DIAGNOSIS — M79609 Pain in unspecified limb: Secondary | ICD-10-CM

## 2010-10-21 DIAGNOSIS — Z471 Aftercare following joint replacement surgery: Secondary | ICD-10-CM

## 2010-10-21 LAB — PR X-RAY EXAM OF FINGER(S) LEFT

## 2010-10-21 MED ORDER — OXYCODONE HCL 5 MG OR TABS
ORAL_TABLET | ORAL | Status: AC
Start: 2010-10-21 — End: ?

## 2010-10-21 MED ORDER — HYDROCODONE-ACETAMINOPHEN 10-325 MG OR TABS
ORAL_TABLET | ORAL | Status: AC
Start: 2010-10-21 — End: ?

## 2010-10-21 NOTE — Progress Notes (Signed)
Rebecca Nichols is a 44 year old female who is now 2 weeks s/p revision CMC arthroplasty left thumb. She has had multiple revisions.  She currently rates her pain as 7 out of 10. She denies any numbness in the hand. Denies any fevers or chills and Denies erythema or purulent drainage.    EXAM  Incision is clean and dry, intact, no dehiscence.  Sensation to light touch is intact over the median, radial, and ulnar nerve distribution.    STUDIES  X-rays of the thumb show no change in alignment. No evidence of hardware loosening.    IMPRESSION  2 weeks s/p revision CMC arthroplasty left thumb. She has had multiple revisions.     PLAN  Cast:  thumb spica  Refill opioid prescription  Return to clinic in 4 weeks.  X-rays L thumb    Aura Camps MD [facility]  Hand and Microvascular Fellow  [place] [facility] [place], [place]    ATTENDING STATEMENT:    I personally evaluated and examined the patient above, and agree with the findings, impression, and plan outlined above.     Azucena Fallen, MD  [facility]  Department of Orthopaedics and Sports Medicine  Hand, Wrist, and Elbow Surgery

## 2010-10-21 NOTE — Progress Notes (Signed)
Applied left thumb spica cast as per orders.  I reviewed CNS and hygiene protocol with patient.

## 2010-11-25 ENCOUNTER — Ambulatory Visit
Payer: Self-pay | Attending: Rehabilitative and Restorative Service Providers" | Admitting: Rehabilitative and Restorative Service Providers"

## 2010-11-25 ENCOUNTER — Encounter (HOSPITAL_BASED_OUTPATIENT_CLINIC_OR_DEPARTMENT_OTHER): Payer: Self-pay | Admitting: Rehabilitative and Restorative Service Providers"

## 2010-11-25 ENCOUNTER — Ambulatory Visit: Payer: Self-pay | Admitting: Rehabilitative and Restorative Service Providers"

## 2010-11-25 VITALS — BP 102/63 | HR 89

## 2010-11-25 DIAGNOSIS — M20019 Mallet finger of unspecified finger(s): Secondary | ICD-10-CM

## 2010-11-25 DIAGNOSIS — Z96698 Presence of other orthopedic joint implants: Secondary | ICD-10-CM

## 2010-11-25 DIAGNOSIS — IMO0001 Reserved for inherently not codable concepts without codable children: Secondary | ICD-10-CM

## 2010-11-25 DIAGNOSIS — M25649 Stiffness of unspecified hand, not elsewhere classified: Secondary | ICD-10-CM

## 2010-11-25 DIAGNOSIS — Z471 Aftercare following joint replacement surgery: Secondary | ICD-10-CM

## 2010-11-25 DIAGNOSIS — M24849 Other specific joint derangements of unspecified hand, not elsewhere classified: Secondary | ICD-10-CM

## 2010-11-25 DIAGNOSIS — M19049 Primary osteoarthritis, unspecified hand: Secondary | ICD-10-CM

## 2010-11-25 DIAGNOSIS — M25549 Pain in joints of unspecified hand: Secondary | ICD-10-CM

## 2010-11-25 MED ORDER — HYDROCODONE-ACETAMINOPHEN 10-325 MG OR TABS
ORAL_TABLET | ORAL | Status: AC
Start: 2010-11-25 — End: ?
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
## 2010-11-26 LAB — PR X-RAY EXAM OF FINGER(S) LEFT

## 2010-11-27 NOTE — Progress Notes (Signed)
Rebecca Nichols is a 45 year old female who is now 6 weeks s/p revision left thumb CMC arthroplasty that was performed on 10/08/10.  She states that 10 days ago while waking on her deck with her dog, her dog lunged and she fell onto her chest.  She sustained lacerations to her chest, but also mentions she hit her left thumb on the ground at the time.  She has noticed increased pain with her left thumb since the accident.  She currently rates her pain as 2 out of 10. She denies any numbness in the left thumb. Denies any fevers or chills and Denies erythema or purulent drainage.  She has also been splinting a left RF mallet finger.  The splint has been on for 6 weeks.  She is noticing increased pain over the dorsal aspect of the distal RF.    EXAM  Incision is clean and dry, intact, no dehiscence, well-healed.  Sensation to light touch is intact over the radial/ulnar aspect of the left thumb.  She is tender to palpation over her left thumb CMC joint  Moderate swelling is noted over left thumb CMC joint  Left RF has erythema over dorsal distal phalanx.      STUDIES  X-rays of the left thumb show complete subsidence of her left thumb metacarpal which is now sitting on her scaphoid.  Revision CMC arthoplasty has failed.      IMPRESSION  6 weeks s/p revision left thumb CMC joint arthroplasty, which has now failed since falling onto her left thumb 10 days ago.  Left RF mallet finger treated closed    PLAN  Referral to hand therapy for thumb spica splint and new mallet splint to take pressure off distal phalanx of left RF .  Active ROM.  No lifting more than 10 lbs.  X-rays of left thumb at next visit  Follow-up in clinic in 6 weeks  Patient given Vicodin 10 mg #20 for help with pain control.  She was told we will watch this for now and re-evaluate at next visit.  If she has continued pain, we may consider fusion, but we will decide that later.    Santiago Bur, PA-C, ATC  [facility] Medicine  Orthopaedics & Sports Medicine  Hand  and Upper Extremity Surgery

## 2011-01-06 ENCOUNTER — Encounter (HOSPITAL_BASED_OUTPATIENT_CLINIC_OR_DEPARTMENT_OTHER): Payer: Self-pay | Admitting: Hand Surgery

## 2011-01-13 ENCOUNTER — Encounter (HOSPITAL_BASED_OUTPATIENT_CLINIC_OR_DEPARTMENT_OTHER): Payer: Self-pay | Admitting: Hand Surgery

## 2011-01-13 ENCOUNTER — Ambulatory Visit: Payer: Self-pay | Attending: Hand Surgery | Admitting: Hand Surgery

## 2011-01-13 VITALS — BP 123/83 | HR 97

## 2011-01-13 DIAGNOSIS — M19049 Primary osteoarthritis, unspecified hand: Secondary | ICD-10-CM

## 2011-01-13 DIAGNOSIS — M79609 Pain in unspecified limb: Secondary | ICD-10-CM

## 2011-01-14 NOTE — Progress Notes (Signed)
Rebecca Nichols is a 45 year old female who is now 3 months s/p revision left thumb CMC arthroplasty with semitendinosus allograft that was performed on 10/08/10.  As you remember she sustained a fall onto her outstretched hand on 11/18/10.  At her last visit she was found to have failed the arthroplasty due to the fall.  She currently rates her pain as 5 out of 10. She denies any numbness in the left hand. Denies any fevers or chills and Denies erythema or purulent drainage.  She states she has not been wearing her splint for the past 2 weeks.  She has no pain at rest, only with activities such as gripping and twisting.  She has began swimming and wears the splint for this activity.      EXAM  Incision is clean and dry, intact, no dehiscence, well-healed.  Sensation to light touch is intact over the median, radial, and ulnar nerve distribution.  She has a small firm mass under the surgical scar which is probably the allograft.  She has full left thumb ROM.    STUDIES  X-rays of the left thumb show complete subsidence of her left thumb metacarpal.  No change in alignment since last x-ray.     IMPRESSION  3 months s/p revision left thumb CMC arthroplasty, failed after ground level fall, but patient doing well.    PLAN  She was told the next step would be fusion, but there was no hurry for this.  She is doing well, and if pain is not bad, she can consider surgery when function declines or pain worsens.  She is interested in the fusion, but wants to wait until after the summer.  She will follow-up with [place] prn.  Patient is happy with the plan.    Dr. Renaldo Reel saw the patient, evaluated the patient, and formulated the plan to his satisfaction.    Santiago Bur, PA-C, ATC  [facility]  Orthopaedics & Sports Medicine  Hand and Upper Extremity Surgery

## 2011-01-15 LAB — PR X-RAY EXAM OF FINGER(S) LEFT

## 2013-06-08 ENCOUNTER — Telehealth (HOSPITAL_BASED_OUTPATIENT_CLINIC_OR_DEPARTMENT_OTHER): Payer: Self-pay | Admitting: Rehabilitative and Restorative Service Providers"

## 2013-06-08 NOTE — Telephone Encounter (Signed)
CONFIRMED PHONE NUMBER: [id number]  CALLERS FIRST AND LAST NAME: Phillips Grout Hamblin  FACILITY NAME: na TITLE: na  CALLERS RELATIONSHIP:Self  RETURN CALL: Detailed message on voicemail only     SUBJECT: General Message   REASON FOR REQUEST: Referral questions    MESSAGE: patient states she received a call from the clinic informing that referral was in. CCR was unable to locate a referral in system. Please contact and advise, thank you

## 2013-06-19 ENCOUNTER — Ambulatory Visit
Payer: No Typology Code available for payment source | Attending: Rehabilitative and Restorative Service Providers" | Admitting: Rehabilitative and Restorative Service Providers"

## 2013-06-19 DIAGNOSIS — G35 Multiple sclerosis: Secondary | ICD-10-CM

## 2013-06-19 DIAGNOSIS — F4320 Adjustment disorder, unspecified: Secondary | ICD-10-CM | POA: Insufficient documentation

## 2013-06-19 DIAGNOSIS — G35D Multiple sclerosis, unspecified: Secondary | ICD-10-CM | POA: Insufficient documentation

## 2013-06-29 DIAGNOSIS — G35 Multiple sclerosis: Secondary | ICD-10-CM | POA: Insufficient documentation

## 2013-06-29 NOTE — Progress Notes (Signed)
[facility] MEDICINE  REHABILITATION COUNSELING OUTPATIENT ASSESSMENT       [name], [id number]    Billing code: 46962 x 5. (1 hour, 15 minute face-to-face initial Rehabilitation Counseling consultation with the patient to reassess psychological and social factors associated with the patient's health condition that may have an impact on employment and community living).    ICD-9 code:  (340) Multiple sclerosis  (primary encounter diagnosis)    Referral Source:  Earle Gell, MD, with [facility]    Services: Rehabilitation Counseling outpatient services are requested due to the negative impact of the patient's health condition on both daily life and employment activities.  Rehabilitation Counseling, evaluation and therapeutic treatment services are requested.    HISTORY     Rebecca Nichols is a 48 year old female with history of Multiple Sclerosis  in approximately 2002.     Past Medical History   Diagnosis Date   . ANXIETY STATE NOS    . MYALGIA AND MYOSITIS NOS    . PSORIASIS/LIKE DISORDERS*    . GENERAL OSTEOARTHROSIS        Patient Active Problem List   Diagnosis   . MALLET FINGER   . JOINT DERANGEMENT NEC-HAND   . Multiple sclerosis       Past Surgical History   Procedure Laterality Date   . Tonsillectomy one-half <age 74     . Bilateral thumb cmc arthroplasties     . Fibroid cysts     . Corrj hallux valgus w/wo sesmdc smpl exostectomy     . Unlisted procedure hands/fingers       L thumb CMC arthoplasty x3       History     Social History   . Marital Status: Legally Separated     Spouse Name: N/A     Number of Children: N/A   . Years of Education: N/A     Occupational History   . Not on file.     Social History Main Topics   . Smoking status: Current Every Day Smoker -- 0.50 packs/day for 20 years     Types: Cigarettes   . Smokeless tobacco: Not on file   . Alcohol Use: Yes      Comment: occasional   . Drug Use: No   . Sexually Active: Not on file     Other Topics Concern   . Not on file      Social History Narrative   . No narrative on file     This counselor is familiar with the patient from prior services Rehabilitation Counseling service form 08/04/2006 to 02/24/2007. Please refer to prior reports for more information on employment history, educational history, MS-related health employment challenge, disability benefits and relevant medical history.    Patient reports that she receives care for MS from Dr. Manson Passey with [facility]. She reports that severe fatigue causes exhaustion which limits both employment and daily life activities. She is on Copaxone for DMT. Patient reports diagnosis of bipolar disorder since the last visit in 2008 which is medically managed by her PCP. Patient has an upcoming appointment with [facility] psychologist, Clement Husbands, Psy.D. Patient reports that she has engaged in PT for MS-related balance challenge and weakness affecting her left upper and lower extremities. From a cognitive perspective, patient reports some increased cognitive challenge with memory, planning and organization with negatively impact her job. Please refer to the patient's medical record for more detailed information on comprehensive neuropsychological evaluation (NPE) from 2007 or  2008.    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   Patient was off work for several years and received disability benefits.   08/2011. 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   Work schedule: Full time, 40+ hours per week. Split days off (Friday and Sunday). Patient often schedules medical appointments on Friday and usually rests all  day on Sunday due to MS fatigue. Patient reports that she is off work, she is often in bed due to fatigue. Patient finds the transition from part to full time work schedule is very taxing and she questions if it is possible to continue due to her MS symptoms. Patient reports a prior work schedules with had consecutive days off work were much better matched to better energy maintenance and she experienced a decline in function after schedule change with split days off work.   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 Patient found that these tasks are better matched for both her capacities as well as interests. She was recently informed that her employer will cut the training duties to 50% and require all employees to meet retail sales quotas. 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 The retail role also involves more standing and walking in the store, which the patient finds exacerbates her MS fatigue.    Medical leave of absence: Currently off work for 4 weeks with tentative return to work date scheduled for 06/27/2013 due to extreme exhaustion (MS fatigue), anxiety and panic disorder. Patient does not find that her symptoms have improved and will discuss extension of her medical leave with Dr. Logan Bores.    IMPACT OF MEDICAL SYMPTOMS ON EMPLOYMENT & DAILY ACTIVITIES   While working, the patient's SO took over the majority of household chores due to the patient's severe fatigue after getting home from work and on days off work.   Patient anticipates that the SO and she will will be separating, and she no longer have the high level of support with IADLs that has been available from her SO. Patient fears that she does not have the energy and overall capacity to  work and independently perform IADLs.   Activity pacing strategies and energy conservation techniques were explored.    BENEFITS   Social Security Disability: Past 5 years. Patient is currently in an Extended Period of Eligibility, which is a part of an employment incentives program with [employer].    Health insurance: Employer sponsored.   Short term disability (applied by not approved) for her current medical leave of absence. Patient reports that she is eligible for up to 26 weeks of short term disability benefits.    ADJUSTMENT TO DISABILITY  Adjustment to disability counseling provided for adjustment support in relation to the impact of health symptoms on life activities and quality of life. Patient reports a limited support system. Patient reports that since the bulk of her time and energy  is spent at work, she has not been able to engage in social activities or reach out for more social support. Additionally, patient anticipates her relationship with her SO will be ending, and he will be moving out of the home which leads to further social isolation.    GOALS     1. Patient will discuss extension of her medical leave of absence from work with Dr. Logan Bores and Dr. Manson Passey, accordingly. She reports ongoing MS-related limitations affecting both her ability to return to work and engage in a wide range of IADLs. This counselor agrees that the patient appear to have the potential to benefit from an extension of her medical leave of absence.  2. Patient will return to Rehabilitation Counseling for ongoing activity goal setting in context of health related limitations, exploration of compensatory and energy conservation strategies, and adjustment to disability counseling.    PLAN  1. This plan is developed with the direct participation and consent of [name].  2. This counselor remains available to [name] by request for further treatment services.  3. This counselor remains available to other members of  the patient's healthcare team for further treatment planning.    Debbora Presto, MS, [facility]  Rehabilitation Counselor  [facility]  [facility]  [address]  [phone]  [place], [place] [address]  Scheduling: [phone]  Voice mail: [phone]  Email: [email or web address]

## 2013-07-04 ENCOUNTER — Ambulatory Visit
Payer: No Typology Code available for payment source | Attending: Physical Medicine & Rehabilitation | Admitting: Rehabilitative and Restorative Service Providers"

## 2013-07-04 DIAGNOSIS — F4320 Adjustment disorder, unspecified: Secondary | ICD-10-CM | POA: Insufficient documentation

## 2013-07-04 DIAGNOSIS — G35D Multiple sclerosis, unspecified: Secondary | ICD-10-CM | POA: Insufficient documentation

## 2013-07-04 DIAGNOSIS — G35 Multiple sclerosis: Secondary | ICD-10-CM | POA: Insufficient documentation

## 2013-07-04 NOTE — Progress Notes (Signed)
[place] [facility]  REHABILITATION COUNSELING INTERVENTION      Rebecca Nichols Rebecca Nichols, [id number]    Billing code: 65784 x 4 (1 hour face-to-face Rehabilitation Counseling intervention with patient to provide counseling to modify the psychological, behavioral, cognitive, and social factors affecting the patient's well-being including employment).     ICD-9 code:  (340) Multiple sclerosis  (primary encounter diagnosis)    Referral Source:  Rebecca Gell, MD, with [facility].     Services: Rehabilitation Counseling outpatient services are requested due to the negative impact of the patient's health condition on both daily life and employment activities.  Rehabilitation Counseling, evaluation and therapeutic treatment services are requested.    This counselor is familiar with the patient from prior Rehabilitation Counseling treatment services. Please refer to Rebecca Nichols's medical record for additional information on her medical history, current medical status, medications and treatment plan.    HISTORY     Rebecca Nichols is a 47 year old female with history of Multiple Sclerosis      Past Medical History   Diagnosis Date   . ANXIETY STATE NOS    . MYALGIA AND MYOSITIS NOS    . PSORIASIS/LIKE DISORDERS*    . GENERAL OSTEOARTHROSIS        Patient Active Problem List   Diagnosis   . MALLET FINGER   . JOINT DERANGEMENT NEC-HAND   . Multiple sclerosis       Past Surgical History   Procedure Laterality Date   . Tonsillectomy one-half <age 74     . Bilateral thumb cmc arthroplasties     . Fibroid cysts     . Corrj hallux valgus w/wo sesmdc smpl exostectomy     . Unlisted procedure hands/fingers       L thumb CMC arthoplasty x3     This counselor is familiar with the patient from prior services Rehabilitation Counseling service form 08/04/2006 to 02/24/2007 and recent reassessment on 06/19/2013. Information from the most recent assessment is included below for ease of reference. Please  refer to the INTERVENTION section for more recent updates.    Patient reports that she receives care for MS from Dr. Manson Nichols with [facility]. She reports that severe fatigue causes exhaustion which limits both employment and daily life activities. She is on Copaxone for DMT. Patient reports diagnosis of bipolar disorder since the last visit in 2008 which is medically managed by her PCP. Patient has an upcoming appointment with [facility] psychologist, Rebecca Nichols, Psy.D. Patient reports that she has engaged in PT for MS-related balance challenge and weakness affecting her left upper and lower extremities. From a cognitive perspective, patient reports some increased cognitive challenge with memory, planning and organization with negatively impact her job. Please refer to the patient's medical record for more detailed information on comprehensive neuropsychological evaluation (NPE) from 2007 or 2008.    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   Patient was off work for several years and received disability benefits.   08/2011. 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   Work schedule: Full time, 40+ hours per week. Split days off (Friday  and Sunday). Patient often schedules medical appointments on Friday and usually rests all day on Sunday due to MS fatigue. Patient reports that she is off work, she is often in bed due to fatigue. Patient finds the transition from part to full time work schedule is very taxing and she questions if it is possible to continue due to her MS symptoms. Patient reports a prior work schedules with had  consecutive days off work were much better matched to better energy maintenance and she experienced a decline in function after schedule change with split days off work.   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 Patient found that these tasks are better matched for both her capacities as well as interests. She was recently informed that her employer will cut the training duties to 50% and require all employees to meet retail sales quotas. 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 The retail role also involves more standing and walking in the store, which the patient finds exacerbates her MS fatigue.    Medical leave of absence: Currently off work for 4 weeks with tentative return to work date scheduled for 06/27/2013 due to extreme exhaustion (MS fatigue), anxiety and panic disorder. Patient does not find that her symptoms have improved and will discuss extension of her medical leave with Dr. Logan Bores.    IMPACT OF MEDICAL SYMPTOMS ON EMPLOYMENT & DAILY ACTIVITIES   While working, the patient's SO took over the majority of household chores due to the patient's severe fatigue after getting home from work and on days off work.   Patient anticipates that the SO and she will will be separating, and she no longer have the high level of support with IADLs that has been available from her SO. Patient fears that she does not have the energy and overall capacity to work and independently perform IADLs.   Activity pacing strategies and energy conservation techniques were explored.    BENEFITS   Social Security Disability: Past 5 years. Patient is currently in an Extended Period of Eligibility, which is a part of an employment incentives program with [employer].     Health insurance: Employer sponsored.   Short term disability (applied by not approved) for her current medical leave of absence. Patient reports that she is eligible for up to 26 weeks of short term disability benefits.    ADJUSTMENT TO DISABILITY  Adjustment to disability counseling provided for adjustment support in relation to the impact of health symptoms on life activities and quality of life. Patient reports a limited support system. Patient reports that since the bulk of her time and energy is spent at work, she has not been able to engage in social activities or reach out for more social support. Additionally, patient anticipates her relationship with her SO will be ending, and he will be moving out of the home which leads to further social isolation.    GOALS     1. Patient will discuss extension of her medical leave of absence from work with Dr. Logan Bores and Dr. Manson Nichols, accordingly. She reports ongoing MS-related limitations affecting both her ability to return to work and engage in a wide range of IADLs. This counselor agrees that the patient appear to have the potential to benefit from an  extension of her medical leave of absence.  2. Patient will return to Rehabilitation Counseling for ongoing activity goal setting in context of health related limitations, exploration of compensatory and energy conservation strategies, and adjustment to disability counseling.      INTERVENTION  During the Rehabilitation Counseling session, services focused on:  1. Medical update: Reviewed medical record and discussed health status with Rebecca Nichols. Patient reports that she continues to experience health related limitation and would like to extend her medical leave of absence another week. She will consult with Dr. Manson Nichols and Dr. Clayburn Pert on extending the leave another week. Additional updates include abdominal pain for 2 weeks, and the patient will consult with her PCP later today. Sleep disruption is ongoing. Patient  reports that she consulted with Dr. Logan Bores since last seeing this counselor and is exploring getting an updated comprehensive neuropsychological evaluation (NPE). Last NPE was in approximately 2007. Upcoming appointment with Dr. Logan Bores tomorrow.  2. Social & financial: Patient reports that she has a roommate to help offset household expenses. She broke up with her boyfriend, and he moved out of the home. Patient reports that she has aligned more social support and has reached out to family member and friends accordingly. From a financial perspective, patient reports that she has not received payment on her short-term disability benefits for 6 weeks and is struggling financially. She has depleted savings and will have to ask for some temporary financial support from family.  3. Employment planning: Patient will consult with Dr. Logan Bores and Dr. Manson Nichols about extending her medical leave of absence another week. Tentative return to work date would be around 07/17/2013. ADA based job accommodations/modification were explored with the patient. Patient reports ongoing challenge with her MS symptoms and related disruption with both employment and daily life activities. When working full time, patient's MS-related fatigue limited her ability to engage in many IADLs. Patient's boyfriend performed many of these tasks for her. These supports are no longer available, and the patient understandably questions her ability to maintain both full time employment and meet he self-care/IADL needs. Patient will discuss transitioning to working part-time at 3 days per week with Dr. Manson Nichols and Dr. Logan Bores. 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 In summary ADA job accomodations being considered include:   Reduction from full to part time due to MS-related health limitations.   Job duties focus predominantly on customer 1 on 1 personal training. 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  4. Benefits: Patient will request that her disability benefits be continued during her medical leave of absence and in relation to reduction from full to part time work stemming from MS-related health challenge.  5. Adjustment to disability: Counseling provided in relation to the impact of health symptoms on life activity and related adjustment needs.   6. Goals:    Reviewed in relation to the Rebecca Nichols's interests, experience and health related limitations.   Patient will consult with Dr. Manson Nichols and Dr. Logan Bores on extension of medical leave for one additional week with tentative return to work date of 07/17/2013.  Patient will discuss getting an updated NPE with Dr. Logan Bores.   Patient will consult with Dr. Manson Nichols and Dr. Logan Bores on the aforementioned ADA based job accommodations.   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   Ms. Nicholes Rough will return to Rehabilitation Counseling for additional support with goal setting, identification of resources, development of compensatory strategies and adjustment to disability counseling.    PLAN  1. This plan is developed with the direct participation and consent of the Rebecca Nichols.  2. This counselor remains available to Rebecca Nichols by request for further treatment services.  3. This counselor remains available to other members of the patient's healthcare team for further treatment planning.    Debbora Presto, MS, [facility]  Rehabilitation Counselor  [facility]  [address]  [phone]  [place], [place] [address]

## 2013-07-31 ENCOUNTER — Ambulatory Visit
Payer: No Typology Code available for payment source | Attending: Physical Medicine & Rehabilitation | Admitting: Rehabilitative and Restorative Service Providers"

## 2013-07-31 DIAGNOSIS — G35 Multiple sclerosis: Secondary | ICD-10-CM | POA: Insufficient documentation

## 2013-07-31 DIAGNOSIS — G35D Multiple sclerosis, unspecified: Secondary | ICD-10-CM | POA: Insufficient documentation

## 2013-08-02 NOTE — Progress Notes (Signed)
[place] OF [facility]  REHABILITATION COUNSELING INTERVENTION      Rebecca Nichols, [id number]    Billing code: 54098 x 4 (1 hour face-to-face Rehabilitation Counseling intervention with patient to provide counseling to modify the psychological, behavioral, cognitive, and social factors affecting the patient's well-being including employment).     ICD-9 code:  (340) Multiple sclerosis  (primary encounter diagnosis)    Referral Source:  Rebecca Gell, MD, with Rebecca Nichols.     Services: Rehabilitation Counseling outpatient services are requested due to the negative impact of the patient's health condition on both daily life and employment activities.  Rehabilitation Counseling, evaluation and therapeutic treatment services are requested.    This counselor is familiar with the patient from prior Rehabilitation Counseling treatment services. Please refer to Rebecca Nichols's medical record for additional information on her medical history, current medical status, medications and treatment plan.    HISTORY     Rebecca Nichols is a 47 year old female with history of Multiple Sclerosis      Past Medical History   Diagnosis Date   . ANXIETY STATE NOS    . MYALGIA AND MYOSITIS NOS    . PSORIASIS/LIKE DISORDERS*    . GENERAL OSTEOARTHROSIS        Patient Active Problem List   Diagnosis   . MALLET FINGER   . JOINT DERANGEMENT NEC-HAND   . Multiple sclerosis       Past Surgical History   Procedure Laterality Date   . Tonsillectomy one-half <age 44     . Bilateral thumb cmc arthroplasties     . Fibroid cysts     . Corrj hallux valgus w/wo sesmdc smpl exostectomy     . Unlisted procedure hands/fingers       L thumb CMC arthoplasty x3     This counselor is familiar with the patient from prior services Rehabilitation Counseling service form 08/04/2006 to 02/24/2007 and recent reassessment on 06/19/2013. Information from the most recent assessment is included below for ease of reference. Please  refer to the INTERVENTION section for more recent updates.    Patient reports that she receives care for MS from Dr. Manson Nichols with [facility]. She reports that severe fatigue causes exhaustion which limits both employment and daily life activities. She is on Copaxone for DMT. Patient reports diagnosis of bipolar disorder since the last visit in 2008 which is medically managed by her PCP. Patient has an upcoming appointment with Rebecca Nichols psychologist, Rebecca Nichols, Rebecca Nichols. Patient reports that she has engaged in PT for MS-related balance challenge and weakness affecting her left upper and lower extremities. From a cognitive perspective, patient reports some increased cognitive challenge with memory, planning and organization with negatively impact her job. Please refer to the patient's medical record for more detailed information on comprehensive neuropsychological evaluation (NPE) from 2007 or 2008.    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   Patient was off work for several years and received disability benefits.   08/2011. 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   Work schedule: Full time, 40+ hours per week. Split days off (Friday  and Sunday). Patient often schedules medical appointments on Friday and usually rests all day on Sunday due to MS fatigue. Patient reports that she is off work, she is often in bed due to fatigue. Patient finds the transition from part to full time work schedule is very taxing and she questions if it is possible to continue due to her MS symptoms. Patient reports a prior work schedules with had  consecutive days off work were much better matched to better energy maintenance and she experienced a decline in function after schedule change with split days off work.   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 Patient found that these tasks are better matched for both her capacities as well as interests. She was recently informed that her employer will cut the training duties to 50% and require all employees to meet retail sales quotas. 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 The retail role also involves more standing and walking in the store, which the patient finds exacerbates her MS fatigue.    Medical leave of absence: Patient off work for 4-5 weeks due extreme exhaustion (MS fatigue), anxiety and panic disorder. Patient returned to work on 07/17/2013 with job accommodations.    IMPACT OF MEDICAL SYMPTOMS ON EMPLOYMENT & DAILY ACTIVITIES   While working, the patient's SO took over the majority of household chores due to the patient's severe fatigue after getting home from work and on days off work.   Patient and SO separated, so she no longer has the high level of support with IADLs that has been available from her SO. Patient fears that she does not have the energy and overall capacity to work and independently perform IADLs.   Activity pacing strategies and energy conservation techniques were explored.    BENEFITS   Social Security Disability: Past 5 years. Patient is currently in an Extended Period of Eligibility, which is a part of an employment incentives program with [employer].    Health insurance: Employer sponsored.   Short term disability (applied by not approved) for her current medical leave of absence. Patient reports that she  is eligible for up to 26 weeks of short term disability benefits.    ADJUSTMENT TO DISABILITY  Adjustment to disability counseling provided for adjustment support in relation to the impact of health symptoms on life activities and quality of life. Patient reports a limited support system. Patient reports that since the bulk of her time and energy is spent at work, she has not been able to engage in social activities or reach out for more social support. Additionally, patient anticipates her relationship with her SO will be ending, and he will be moving out of the home which leads to further social isolation.      INTERVENTION  During the Rehabilitation Counseling session, services focused on:  1. Medical update: Reviewed medical record and discussed health status with Rebecca Nichols. Ongoing fatigue, back pain and sleep disruption.  2. Employment planning: Patient returned to work on 07/17/2013 following a medical leave of absence. ADA based job accommodations/modification were requested and took some time for [employer] to approve. In summary ADA job accomodations approved include:   Reduction from full to 25-30 hours per week due to MS-related health limitations.   2 consecutive  days off work   Job duties focus predominantly on customer 1 on 1 personal training. 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  3. Benefits: Patient reports that her appeal of disability benefits through [employer] for coverage during her medical leave of absence and in relation to reduction from full to part time work stemming from MS-related health challenge was denied. Patient reports that she has not been paid by [employer] in 9 weeks.  4. Adjustment to disability: Counseling provided in relation to the impact of health symptoms on life activity and related adjustment needs.   5. Goals:    Reviewed in  relation to the Rebecca Nichols's interests, experience and health related limitations.   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   Ms. Nicholes Rough will return to Rehabilitation Counseling for additional support with goal setting, identification of resources, development of compensatory strategies and adjustment to disability counseling.    PLAN  1. This plan is developed with the direct participation and consent of the Rebecca Nichols.  2. This counselor remains available to Rebecca Nichols by request for further treatment services.  3. This counselor remains available to other members of the patient's healthcare team for further treatment planning.    Debbora Presto, MS, [facility]  Rehabilitation Counselor  [facility]  [address]  [address]  [place], [place] [address]

## 2013-08-28 ENCOUNTER — Ambulatory Visit
Payer: No Typology Code available for payment source | Attending: Physical Medicine & Rehabilitation | Admitting: Rehabilitative and Restorative Service Providers"

## 2013-08-28 DIAGNOSIS — G35 Multiple sclerosis: Secondary | ICD-10-CM

## 2013-08-28 DIAGNOSIS — F4320 Adjustment disorder, unspecified: Secondary | ICD-10-CM | POA: Insufficient documentation

## 2013-08-28 DIAGNOSIS — G35D Multiple sclerosis, unspecified: Secondary | ICD-10-CM | POA: Insufficient documentation

## 2013-08-30 NOTE — Progress Notes (Signed)
[place] OF [facility]  REHABILITATION COUNSELING INTERVENTION      [name] [place], [id number]    Billing code: 78295 x 3 (45 minutes face-to-face Rehabilitation Counseling intervention with patient to provide counseling to modify the psychological, behavioral, cognitive, and social factors affecting the patient's well-being including employment).     ICD-9 code:  (340) Multiple sclerosis  (primary encounter diagnosis)    Referral Source:  Earle Gell, MD, with [facility].     Services: Rehabilitation Counseling outpatient services are requested due to the negative impact of the patient's health condition on both daily life and employment activities.  Rehabilitation Counseling, evaluation and therapeutic treatment services are requested.    This counselor is familiar with the patient from prior Rehabilitation Counseling treatment services. Please refer to [name]'s medical record for additional information on her medical history, current medical status, medications and treatment plan.    HISTORY     Rebecca Nichols is a 47 year old female with history of Multiple Sclerosis      Past Medical History   Diagnosis Date   . ANXIETY STATE NOS    . MYALGIA AND MYOSITIS NOS    . PSORIASIS/LIKE DISORDERS*    . GENERAL OSTEOARTHROSIS        Patient Active Problem List   Diagnosis   . MALLET FINGER   . JOINT DERANGEMENT NEC-HAND   . Multiple sclerosis       Past Surgical History   Procedure Laterality Date   . Tonsillectomy one-half <age 32     . Bilateral thumb cmc arthroplasties     . Fibroid cysts     . Corrj hallux valgus w/wo sesmdc smpl exostectomy     . Unlisted procedure hands/fingers       L thumb CMC arthoplasty x3     This counselor is familiar with the patient from prior services Rehabilitation Counseling service form 08/04/2006 to 02/24/2007 and recent reassessment on 06/19/2013. Information from recent progress notes are included below for ease of reference. Please  refer to the INTERVENTION section for more recent updates.    Patient reports that she receives care for MS from Dr. Manson Passey with [facility]. She reports that severe fatigue causes exhaustion which limits both employment and daily life activities. She is on Copaxone for DMT. Patient reports diagnosis of bipolar disorder since the last visit in 2008 which is medically managed by her PCP. Patient has an upcoming appointment with [facility] psychologist, Clement Husbands, Psy.D. Patient reports that she has engaged in PT for MS-related balance challenge and weakness affecting her left upper and lower extremities. From a cognitive perspective, patient reports some increased cognitive challenge with memory, planning and organization with negatively impact her job. Please refer to the patient's medical record for more detailed information on comprehensive neuropsychological evaluation (NPE) from 2007 or 2008.    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   Patient was off work for several years and received disability benefits.   08/2011. 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   Work schedule: Full time, 40+ hours per week. Split days off (Friday and  Sunday). Patient often schedules medical appointments on Friday and usually rests all day on Sunday due to MS fatigue. Patient reports that she is off work, she is often in bed due to fatigue. Patient finds the transition from part to full time work schedule is very taxing and she questions if it is possible to continue due to her MS symptoms. Patient reports a prior work schedules with had  consecutive days off work were much better matched to better energy maintenance and she experienced a decline in function after schedule change with split days off work.   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 Patient found that these tasks are better matched for both her capacities as well as interests. She was recently informed that her employer will cut the training duties to 50% and require all employees to meet retail sales quotas. 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 The retail role also involves more standing and walking in the store, which the patient finds exacerbates her MS fatigue.    Medical leave of absence: Patient off work for 4-5 weeks due extreme exhaustion (MS fatigue), anxiety and panic disorder.   Patient returned to work on 07/17/2013 following a medical leave of absence. ADA based job accommodations/modification were requested and took some time for [employer] to approve. In summary ADA job accomodations approved include:   Reduction from full to 25-30 hours per week due to MS-related health limitations.   2 consecutive days off work   Job duties focus predominantly on customer 1 on 1 personal training. 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    IMPACT OF MEDICAL SYMPTOMS ON EMPLOYMENT & DAILY ACTIVITIES   While working, the patient's SO took over the majority of household chores due to the patient's severe fatigue after getting home from work and on days off work.   Patient and SO separated, so she no longer has the high level of support with IADLs that has been available from her SO. Patient  fears that she does not have the energy and overall capacity to work and independently perform IADLs.   Activity pacing strategies and energy conservation techniques were explored.    BENEFITS   Social Security Disability: Past 5 years. Patient is currently in an Extended Period of Eligibility, which is a part of an employment incentives program with [employer].    Health insurance: Employer sponsored.   Short term disability (applied by not approved) for her current medical leave of absence. Patient reports that she is eligible for up to 26 weeks of short term disability benefits.    ADJUSTMENT TO DISABILITY  Adjustment to disability counseling provided for adjustment support in relation to the impact of health symptoms on life activities and quality of life. Patient reports a limited support system. Patient reports that since the bulk of her time and energy is spent at work, she has not been able to engage in social activities or reach out for more social support. Additionally, patient anticipates her relationship with her SO will be ending, and he will be moving out of the home which leads to further social isolation.    COGNITIVE  ASSESSMENT  Patient underwent a comprehensive neuropsychological evaluation (NPE) on 08/14/2013 performed by Clement Husbands, Psy.D. Patient and Dr. Logan Bores provided this counselor with a copy of the full report for review. Document is scanned into the medical record. Patient had an NPE on 08/06/2005 by Dr. [place]i Maya. On recent assessment, Dr. Logan Bores indicates the following:   Patient displayed multiple areas of functioning below expectation with some notable declines in some domains.   Current cognitive deficits: attention, working memory, vigilance and sustained attention, processing speed, motor function, executive functioning particularly with nonverbal conceptualization/novel nonverbal problem solving and flexibility of thought as well as nonverbal fluency.   Notable  declines in cognitive performance are clearly outlined in Dr. Logan Bores report.   Dr. Logan Bores indicates, ...it appears more likely that changes as determined testing, most significantly reflect a progression of her MS as related to further structural brain related changes.   Recommendations for work: Dr. Logan Bores indicates that the patient has experienced significant cognitive, physical and emotional changes that have impacted her job performance. Patient appears unable to effectively manage her job/personal/medical life while working full time given her cognitive and physical deficits and MS-related fatigue. Even with a reduced work schedule, patient continues to experience these health related challenges. Dr. Logan Bores indicates that it remains unclear whether or not engagement in productive work will remain a realistic goal due to the physical and cognitive demands of work. Dr. Logan Bores states, At least in her current job, full time work has not appeared to remain a realistic long-term option, and even less than full-time has involved challenges.Marland KitchenMarland KitchenApplying for disability may remain a viable option at this time.    INTERVENTION  During the Rehabilitation Counseling session, services focused on:  1. Medical update: Reviewed medical record and discussed health status with [name]. Patient reports ongoing health challenge with cognition, fatigue, pain and sleep. She reports diagnosis of sleep apnea. Decline in mood is reported. Patient was unable to afford Abilify and changed medications to Lamictal. She reports the new medication is less effective. She will consult with her psychiatrist through [facility] in [place] , [place] on medication management needs.  2. Employment status: Patient is currently working on a reduced schedule with the aforementioned accommodations. Patient reports that these job accommodations/modifications do not provide her with the level of support needed to perform her job. She reports ongoing challenge  learning and retaining information on new products, in spite of great effort on the patient's part. Knowledge of products is an essential function of her job and limits her ability to answer customer questions. Patient reports that she tries to alternate being on and off her feet hourly, but this is often either problematic or not possible in the retail environment. She has been given negative feedback on her job performance on a low number of customer interactions. Overall, patient's attempt to return to work has been unsuccessful, even with a reduction in schedule from full to part time and with the aforementioned job accommodations. As Dr. Logan Bores highlighted in the NPE report, the patient works in a very stimulating environment requiring higher order attention, novel problem solving, multitasking and physical stamina, among various other abilities, she displayed deficits/impairments in many of these areas.  3. Benefits: Patient reports that her appeal of disability benefits through [employer] for coverage during her medical leave of absence and in relation to reduction from full to part time work stemming from MS-related health challenge was denied. Patient reports that she has not been paid by [employer]. Patient  will submit the NPE report and continue to appeal the denial.  4. Adjustment to disability: Counseling provided in relation to the impact of health symptoms on life activity and related adjustment needs.   5. Goals:    Reviewed in relation to the [name]'s interests, experience and health related limitations.   Patient return to work attempt has been unsuccessful. As noted in prior reports, the patient's applied for disability benefits due to the impact of her health related limitations on her ability to work. Unfortunately, benefits through [employer] were not granted, and the patient made a return to work attempt out of necessity due to lack of income and financial distress. Clearly, she  continues to struggle with a range of health challenges which limit her ability to be meet the demands of her job in spite of reduction in schedule and job accommodations. This counselor fully support the patient's need for disability benefits and hopes that Dr. Logan Bores report will help bring the matter of access to disability benefits to resolution. The denial of benefits has forced the patient to both return to work and continue working, in spite of inability to fully meet her job demands, negative feedback from her employer on job performance and well documented health related challenges. Although the patient is continuing work at this time due to financial need, she is at risk of additional negative job Research officer, political party and possibly future discipline or termination. The patient will continue to consult with this counselor in relation to her health related employment challenges and related needs.   Ms. Nicholes Rough will return to Rehabilitation Counseling for additional support with goal setting, identification of resources, development of compensatory strategies and adjustment to disability counseling.    PLAN  1. This plan is developed with the direct participation and consent of the [name].  2. This counselor remains available to [name] by request for further treatment services.  3. This counselor remains available to other members of the patient's healthcare team for further treatment planning.    Debbora Presto, MS, [facility]  Rehabilitation Counselor  [facility]  [address]  [id number]  [place], [place] [address]

## 2013-09-18 ENCOUNTER — Ambulatory Visit: Payer: No Typology Code available for payment source | Attending: Hand Surgery | Admitting: Hand Surgery

## 2013-09-18 ENCOUNTER — Encounter (INDEPENDENT_AMBULATORY_CARE_PROVIDER_SITE_OTHER): Payer: Self-pay | Admitting: Hand Surgery

## 2013-09-18 VITALS — BP 108/76 | HR 79

## 2013-09-18 DIAGNOSIS — M19049 Primary osteoarthritis, unspecified hand: Secondary | ICD-10-CM

## 2013-09-18 DIAGNOSIS — M189 Osteoarthritis of first carpometacarpal joint, unspecified: Secondary | ICD-10-CM

## 2013-09-18 DIAGNOSIS — M24849 Other specific joint derangements of unspecified hand, not elsewhere classified: Secondary | ICD-10-CM

## 2013-09-18 NOTE — Progress Notes (Signed)
Preoperative education done for left thumb metacarpal to index metacarpal fusion removal hardware  per [facility] protocol. Pt given the following literature: "Information About Your Health Care", the [facility] Pre surgery booklet and Post-op instructions. Stressed NPO status prior to surgery. Answered multiple questions. Pt states understanding of process and accepts plan. Call if further questions or issues, both pre- and/or post-operatively.     Significant other  Aviva Signs [phone]    Ludger Nutting, RN  [facility]

## 2013-09-18 NOTE — Patient Instructions (Signed)
Surgery revision L thumb with L thumb MC to IF MC fusion

## 2013-09-18 NOTE — Progress Notes (Signed)
Rebecca Nichols is a 47 year old female who is here for follow-up of her bilateral thumbs. Patient was last seen in clinic on 01/14/2011. At the time, she was 3 months s/p revision L thumb CMC arthroplasty with semitendinous allograft for suspension and interposition on 10/08/2010. She fell onto the hand 11/18/2010 and had increased pain with subsidence of the thumb metacarpal and reconstruction. She comes in today with increasing pain over the left thumb. Her history is also significant for MS for which she has increasing weakness.  Her right thumb symptoms are not as severe.    Patient currently rates her pain as 4/10 in her bilateral thumbs with left worse than right. Sensation is normal in the bilateral hands.  She notes a decrease in strength in her bilateral hands and thumbs.    Social History: Does not smoke    ROS:   No new skin, neurologic, or musculoskeletal problems       Physical Examination  Gen: Patient is healthy, alert, no distress    Psych: Alert and oriented times 3  Pleasant female. Mood and affect appropriate.    Skin: warm, normal color and sweat patterns.  no abrasions, lacerations, or ecchymosis.    Vascular: Fingers warm, pink, with brisk capillary refill    Neurologic: Sensation to light touch grossly intact over the median, radial, and ulnar distributions    Musculoskeletal:  Mild swelling over the left thumb with adduction deformity  Tenderness and crepitus over base of thumb metacarpal with positive grind  Mild tenderness over the right thumb with minimal crepitus    Full composite flexion/extension all digits    Studies    X-rays of the left thumb were reviewed and show collapse of the thumb metacarpal onto the scaphoid with minimal space  X-rays of the right thumb were reviewed and show mini Tightrope buttons in good position with subsidence thumb metacarpal but 3 mm space between thumb metacarpal and the scaphoid.    Impression  Failed revision L thumb CMC arthroplasty with  allograft suspension  History of R thumb CMC arthroplasty with subsidence    Plan  Ms. Kozinski' left thumb has become increasingly more painful and disabling. She is interested in surgical options. With the repeated failed suspensions, I recommended proceeding with a thumb MC to IF MC fusion to stabilize the thumb metacarpal. She would have no CMC motion after the surgery and already has a fused MCP joint. She would still have IP joint motion. We would place her [facility] joint in a functional position for opposition.  She would like to have the surgery done before the end of the year.    The risks and benefits of surgery were explained to the patient including potential complications that include but are not limited to bleeding, infection, wound problems, neurovascular injury, nonunion, hardware failure, swelling, possible need for revision surgery, and perioperative cardiac and pulmonary complications. Informed consent was obtained. Patient will work with my surgical scheduler to arrange a date for surgery.      Azucena Fallen, MD  [facility]  Department of Orthopaedics and Sports Medicine  Hand, Wrist, and Elbow Surgery  [phone])

## 2013-09-25 ENCOUNTER — Ambulatory Visit
Payer: No Typology Code available for payment source | Attending: Physical Medicine & Rehabilitation | Admitting: Rehabilitative and Restorative Service Providers"

## 2013-09-25 DIAGNOSIS — G35D Multiple sclerosis, unspecified: Secondary | ICD-10-CM | POA: Insufficient documentation

## 2013-09-25 DIAGNOSIS — G35 Multiple sclerosis: Secondary | ICD-10-CM

## 2013-09-25 DIAGNOSIS — F4320 Adjustment disorder, unspecified: Secondary | ICD-10-CM | POA: Insufficient documentation

## 2013-09-26 NOTE — Progress Notes (Signed)
[facility]  REHABILITATION COUNSELING INTERVENTION      Rebecca Nichols, [id number]    Billing code: 52841 x 3 (45 minutes face-to-face Rehabilitation Counseling intervention with patient to provide counseling to modify the psychological, behavioral, cognitive, and social factors affecting the patient's well-being including employment).     ICD-9 code:  (340) Multiple sclerosis  (primary encounter diagnosis)    Referral Source:  Earle Gell, MD, with [facility].     Services: Rehabilitation Counseling outpatient services are requested due to the negative impact of the patient's health condition on both daily life and employment activities.  Rehabilitation Counseling, evaluation and therapeutic treatment services are requested.    This counselor is familiar with the patient from prior Rehabilitation Counseling treatment services. Please refer to [name]'s medical record for additional information on her medical history, current medical status, medications and treatment plan.    HISTORY     Rebecca Nichols is a 47 year old female with history of Multiple Sclerosis      Past Medical History   Diagnosis Date   . Anxiety state, unspecified    . Myalgia and myositis, unspecified    . Psoriasis and similar disorders    . Generalized osteoarthrosis, unspecified site        Patient Active Problem List   Diagnosis   . MALLET FINGER   . JOINT DERANGEMENT NEC-HAND   . Multiple sclerosis       Past Surgical History   Procedure Laterality Date   . Tonsillectomy one-half <age 41     . Bilateral thumb cmc arthroplasties     . Fibroid cysts     . Corrj hallux valgus w/wo sesmdc smpl exostectomy     . Unlisted procedure hands/fingers       L thumb CMC arthoplasty x3     This counselor is familiar with the patient from prior services Rehabilitation Counseling service form 08/04/2006 to 02/24/2007 and recent reassessment on 06/19/2013. Information from recent progress notes are  included below for ease of reference. Please refer to the INTERVENTION section for more recent updates.    Patient reports that she receives care for MS from Dr. Manson Passey with [facility]. She reports that severe fatigue causes exhaustion which limits both employment and daily life activities. She is on Copaxone for DMT. Patient reports diagnosis of bipolar disorder since the last visit in 2008 which is medically managed by her PCP. Patient has an upcoming appointment with [facility] psychologist, Clement Husbands, Psy.D. Patient reports that she has engaged in PT for MS-related balance challenge and weakness affecting her left upper and lower extremities. From a cognitive perspective, patient reports some increased cognitive challenge with memory, planning and organization with negatively impact her job. Please refer to the patient's medical record for more detailed information on comprehensive neuropsychological evaluation (NPE) from 2007 or 2008.    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   Patient was off work for several years and received disability benefits.   08/2011. 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   Work schedule: Full time, 40+ hours per week. Split  days off (Friday and Sunday). Patient often schedules medical appointments on Friday and usually rests all day on Sunday due to MS fatigue. Patient reports that she is off work, she is often in bed due to fatigue. Patient finds the transition from part to full time work schedule is very taxing and she questions if it is possible to continue due to her MS symptoms. Patient  reports a prior work schedules with had consecutive days off work were much better matched to better energy maintenance and she experienced a decline in function after schedule change with split days off work.   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 Patient found that these tasks are better matched for both her capacities as well as interests. She was recently informed that her employer will cut the training duties to 50% and require all employees to meet retail sales quotas. 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 The retail role also involves more standing and walking in the store, which the patient finds exacerbates her MS fatigue.    Medical leave of absence: Patient off work for 4-5 weeks due extreme exhaustion (MS fatigue), anxiety and panic disorder.   Patient returned to work on 07/17/2013 following a medical leave of absence. ADA based job accommodations/modification were requested and took some time for [employer] to approve. In summary ADA job accomodations approved include:   Reduction from full to 25-30 hours per week due to MS-related health limitations.   2 consecutive days off work   Job duties focus predominantly on customer 1 on 1 personal training. 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    IMPACT OF MEDICAL SYMPTOMS ON EMPLOYMENT & DAILY ACTIVITIES   While working, the patient's SO took over the majority of household chores due to the patient's severe fatigue after getting home from work and on days off work.   Patient and SO separated, so she no longer has the high level of support with IADLs that  has been available from her SO. Patient fears that she does not have the energy and overall capacity to work and independently perform IADLs.   Activity pacing strategies and energy conservation techniques were explored.    BENEFITS   Social Security Disability: Past 5 years. Patient is currently in an Extended Period of Eligibility, which is a part of an employment incentives program with [employer].    Health insurance: Employer sponsored.   Short term disability (applied by not approved) for her current medical leave of absence. Patient reports that she is eligible for up to 26 weeks of short term disability benefits.    ADJUSTMENT TO DISABILITY  Adjustment to disability counseling provided for adjustment support in relation to the impact of health symptoms on life activities and quality of life. Patient reports a limited support system. Patient reports that since the bulk of her time and energy is spent at work, she has not been able to engage in social activities or reach out for more social support. Additionally, patient anticipates her relationship with her SO will be ending, and he will be moving out of the home which leads to further social isolation.  COGNITIVE ASSESSMENT  Patient underwent a comprehensive neuropsychological evaluation (NPE) on 08/14/2013 performed by Clement Husbands, Psy.D. Patient and Dr. Logan Bores provided this counselor with a copy of the full report for review. Document is scanned into the medical record. Patient had an NPE on 08/06/2005 by Dr. Wendi Maya. On recent assessment, Dr. Logan Bores indicates the following:   Patient displayed multiple areas of functioning below expectation with some notable declines in some domains.   Current cognitive deficits: attention, working memory, vigilance and sustained attention, processing speed, motor function, executive functioning particularly with nonverbal conceptualization/novel nonverbal problem solving and flexibility of thought  as well as nonverbal fluency.   Notable declines in cognitive performance are clearly outlined in Dr. Logan Bores report.   Dr. Logan Bores indicates, ...it appears more likely that changes as determined testing, most significantly reflect a progression of her MS as related to further structural brain related changes.   Recommendations for work: Dr. Logan Bores indicates that the patient has experienced significant cognitive, physical and emotional changes that have impacted her job performance. Patient appears unable to effectively manage her job/personal/medical life while working full time given her cognitive and physical deficits and MS-related fatigue. Even with a reduced work schedule, patient continues to experience these health related challenges. Dr. Logan Bores indicates that it remains unclear whether or not engagement in productive work will remain a realistic goal due to the physical and cognitive demands of work. Dr. Logan Bores states, At least in her current job, full time work has not appeared to remain a realistic long-term option, and even less than full-time has involved challenges.Marland KitchenMarland KitchenApplying for disability may remain a viable option at this time.    INTERVENTION  During the Rehabilitation Counseling session, services focused on:  1. Medical update: Reviewed medical record and discussed health status with [name]. Patient reports ongoing health challenge with cognition, fatigue, pain and sleep. L thumb surgery is scheduled for end of year. Rx management for bipolar disorder ongoing via PCP office and consultation with her psychologist, Dr. Excell Seltzer.  2. Employment status: Patient is currently working on a reduced schedule with the aforementioned accommodations. Patient finds that the accommodations are not always practical in a retail environment. 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 As noted in prior reports, the patient reports that these job accommodations/modifications  do not provide her with the level of support needed to perform her job. She reports ongoing challenge learning and retaining information on new products, in spite of great effort on the patient's part. Knowledge of products is an essential function of her job and limits her ability to answer customer questions. Patient reports that she tries to alternate being on and off her feet hourly, but this is often either problematic or not possible in the retail environment. She has been given negative feedback on her job performance on a low number of customer interactions. Overall, patient's attempt to return to work has been unsuccessful, even with a reduction in schedule from full to part time and with the aforementioned job accommodations. As Dr. Logan Bores highlighted in the NPE report, the patient works in a very stimulating environment requiring higher order attention, novel problem solving, multitasking and physical stamina, among various other abilities, she displayed deficits/impairments in many of these areas.  3. Benefits: No change in LTD status. As outlined in prior reports, disability benefits through [employer] for coverage during her medical leave of absence and in relation to reduction from full to part time work stemming  from MS-related health challenge was denied. Patient reports that she has not been paid by [employer]. Patient will submit the NPE report and continue to appeal the denial.  4. Adjustment to disability: Counseling provided in relation to the impact of health symptoms on life activity and related adjustment needs.   5. Goals:    Reviewed in relation to the [name]'s interests, experience and health related limitations.   Patient will continue consultation with her established medical team on the impact of her health symptoms on work activity. Patient expresses hope that there may be opportunities at work to focus more exclusively on personal training job duties in the new year after the  holiday sales rush is over. The patient will continue to consult with this counselor in relation to her health related employment challenges and related needs.   Ms. Nicholes Rough will return to Rehabilitation Counseling for additional support with goal setting, identification of resources, development of compensatory strategies and adjustment to disability counseling.    PLAN  1. This plan is developed with the direct participation and consent of the [name].  2. This counselor remains available to [name] by request for further treatment services.  3. This counselor remains available to other members of the patient's healthcare team for further treatment planning.    Debbora Presto, MS, [facility]  Rehabilitation Counselor  [facility]  [address]  [id number]  [place], [place] [address]

## 2013-10-10 ENCOUNTER — Encounter (HOSPITAL_BASED_OUTPATIENT_CLINIC_OR_DEPARTMENT_OTHER): Payer: No Typology Code available for payment source | Admitting: Hand Surgery

## 2013-10-16 ENCOUNTER — Ambulatory Visit
Payer: No Typology Code available for payment source | Attending: Family Medicine | Admitting: Rehabilitative and Restorative Service Providers"

## 2013-10-16 DIAGNOSIS — G35 Multiple sclerosis: Secondary | ICD-10-CM

## 2013-10-16 DIAGNOSIS — G35D Multiple sclerosis, unspecified: Secondary | ICD-10-CM | POA: Insufficient documentation

## 2013-10-18 ENCOUNTER — Encounter (HOSPITAL_BASED_OUTPATIENT_CLINIC_OR_DEPARTMENT_OTHER): Payer: Self-pay | Admitting: Hand Surgery

## 2013-10-20 NOTE — Progress Notes (Signed)
[name]  REHABILITATION COUNSELING INTERVENTION      Joscelyn Hardrick, [id number]    Billing code: [phone] 4 (1 hour face-to-face Rehabilitation Counseling intervention with patient to provide counseling to modify the psychological, behavioral, cognitive, and social factors affecting the patient's well-being including employment).     ICD-9 code:  (340) Multiple sclerosis  (primary encounter diagnosis)    Referral Source:  Jerilee Hoh, MD, with [facility], Multiple Sclerosis [facility].    Services: Rehabilitation Counseling outpatient services are requested due to the negative impact of the patient's health condition on both daily life and employment activities.  Rehabilitation Counseling, evaluation and therapeutic treatment services are requested.    This counselor is familiar with the patient from prior Rehabilitation Counseling treatment services. Please refer to Ms. Whiteman's medical record for additional information on her medical history, current medical status, medications and treatment plan.    HISTORY     Rebecca Nichols is a 47 year old female with history of Multiple Sclerosis      Past Medical History   Diagnosis Date   . Anxiety state, unspecified    . Myalgia and myositis, unspecified    . Psoriasis and similar disorders    . Generalized osteoarthrosis, unspecified site        Patient Active Problem List   Diagnosis   . MALLET FINGER   . JOINT DERANGEMENT NEC-HAND   . Multiple sclerosis       Past Surgical History   Procedure Laterality Date   . Tonsillectomy one-half <age 76     . Bilateral thumb cmc arthroplasties     . Fibroid cysts     . Corrj hallux valgus w/wo sesmdc smpl exostectomy     . Unlisted procedure hands/fingers       L thumb [place] arthoplasty x3     This counselor is familiar with the patient from prior services Rehabilitation Counseling service form 08/04/2006 to 02/24/2007 and recent reassessment on 06/19/2013. Information from  recent progress notes are included below for ease of reference. Please refer to the INTERVENTION section for more recent updates. Please note that patient had a last name change from [place] to [place].    Patient reports that she receives care for MS from Dr. Owens Shark with [facility]. She reports that severe fatigue causes exhaustion which limits both employment and daily life activities. She is on Copaxone for DMT. Patient reports diagnosis of bipolar disorder since the last visit in 2008 which is medically managed by her PCP. Patient has an upcoming appointment with [facility] psychologist, Anabel Halon, Psy.D. Patient reports that she has engaged in PT for MS-related balance challenge and weakness affecting her left upper and lower extremities. From a cognitive perspective, patient reports some increased cognitive challenge with memory, planning and organization with negatively impact her job.    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   Patient was off work for several years and received disability benefits.   08/2011. 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   Work schedule: Full time, 40+ hours per week. Split days off (Friday  and Sunday). Patient often schedules medical appointments on Friday and usually rests all day on Sunday due to MS fatigue. Patient reports that she is off work, she is often in bed due to fatigue. Patient finds the transition from part to full time work schedule is very taxing and she questions if it is possible to continue due to her MS symptoms. Patient reports a prior work schedules with had consecutive  days off work were much better matched to better energy maintenance and she experienced a decline in function after schedule change with split days off work.   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 Patient found that these tasks are better matched for both her capacities as well as interests. She was recently informed that her employer will cut the training duties to 50% and require all employees to meet retail sales quotas. 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 The retail role also involves more standing and walking in the store, which the patient finds exacerbates her MS fatigue.    Medical leave of absence: Patient off work for 4-5 weeks due extreme exhaustion (MS fatigue), anxiety and panic disorder.   Patient returned to work on 07/17/2013 following a medical leave of absence. ADA based job accommodations/modification were requested and took some time for [employer] to approve. In summary ADA job accomodations approved include:   Reduction from full to 25-30 hours per week due to MS-related health limitations.   2 consecutive days off work   Job duties focus predominantly on customer 1 on 1 personal training. 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    IMPACT OF MEDICAL SYMPTOMS ON EMPLOYMENT & DAILY ACTIVITIES   While working, the patient's SO took over the majority of household chores due to the patient's severe fatigue after getting home from work and on days off work.   Patient and SO separated, so she no longer has the high level of support with IADLs that has been available from her SO. Patient fears that she  does not have the energy and overall capacity to work and independently perform IADLs.   Activity pacing strategies and energy conservation techniques were explored.    BENEFITS   Social Security Disability: Past 5 years. Patient is currently in an Extended Period of Eligibility, which is a part of an employment incentives program with [employer].    Health insurance: Employer sponsored.   Short term disability (applied by not approved) for her current medical leave of absence. Patient reports that she is eligible for up to 26 weeks of short term disability benefits.    ADJUSTMENT TO DISABILITY  Adjustment to disability counseling provided for adjustment support in relation to the impact of health symptoms on life activities and quality of life. Patient reports a limited support system. Patient reports that since the bulk of her time and energy is spent at work, she has not been able to engage in social activities or reach out for more social support. Additionally, patient anticipates her relationship with her SO will be ending, and he will be moving out of the home which leads to further social isolation.  COGNITIVE ASSESSMENT  Patient underwent a comprehensive neuropsychological evaluation (NPE) on 08/14/2013 performed by Clement Husbands, Psy.D. Patient and Dr. Logan Bores provided this counselor with a copy of the full report for review. Document is scanned into the medical record. Patient had an NPE on 08/06/2005 by Dr. Wendi Maya. On recent assessment, Dr. Logan Bores indicates the following:   Patient displayed multiple areas of functioning below expectation with some notable declines in some domains.   Current cognitive deficits: attention, working memory, vigilance and sustained attention, processing speed, motor function, executive functioning particularly with nonverbal conceptualization/novel nonverbal problem solving and flexibility of thought as well as nonverbal fluency.   Notable declines in  cognitive performance are clearly outlined in Dr. Logan Bores report.   Dr. Logan Bores indicates, ...it appears more likely that changes as determined testing, most significantly reflect a progression of her MS as related to further structural brain related changes.   Recommendations for work: Dr. Logan Bores indicates that the patient has experienced significant cognitive, physical and emotional changes that have impacted her job performance. Patient appears unable to effectively manage her job/personal/medical life while working full time given her cognitive and physical deficits and MS-related fatigue. Even with a reduced work schedule, patient continues to experience these health related challenges. Dr. Logan Bores indicates that it remains unclear whether or not engagement in productive work will remain a realistic goal due to the physical and cognitive demands of work. Dr. Logan Bores states, At least in her current job, full time work has not appeared to remain a realistic long-term option, and even less than full-time has involved challenges.Marland KitchenMarland KitchenApplying for disability may remain a viable option at this time.      INTERVENTION  During the Rehabilitation Counseling session, services focused on:  1. Medical update: Reviewed medical record and discussed health status with patient. She reports ongoing health challenge with cognition, fatigue, pain and sleep. Rx management for bipolar disorder ongoing via PCP office and consultation with her psychologist, Dr. Excell Seltzer. Patient reports that this is the days she has been out of bed in 4 days due to a respiratory illness with recent antibiotics from her PCP. Patient had a R middle finger tendon injury and is in a brace for 3 weeks. She reports the brace will be removed the day prior to her scheduled L hand surgery. Significant fatigue and increased cognitive challenge after a 5 hour work shift.  2. Employment status: Patient has been off for work the past 4 days and is scheduled to return to work  on 10/17/2013. Patient is currently working on a reduced schedule with the aforementioned accommodations. Patient finds that the accommodations are not always practical in a retail environment. 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 As noted in prior reports, the patient reports that these job accommodations/modifications do not provide her with the level of support needed to perform her job. She reports ongoing challenge learning and retaining information on new products, in spite of great effort on the patient's part. Knowledge of products is an essential function of her job and limits her ability to answer customer questions. Patient reports that she tries to alternate being on and off her feet hourly, but this is often either problematic or not possible in the retail environment. She has been given negative feedback on her job performance on a low number of customer interactions. Patient indicates that attempt to return to work has been compromised due to ongoing health related limitations, even with a reduction  in schedule from full to part time and with the aforementioned job accommodations. As Dr. Logan Bores highlighted in the NPE report, the patient works in a very stimulating environment requiring higher order attention, novel problem solving, multitasking and physical stamina, among various other abilities, she displayed deficits/impairments in many of these areas. Patient indicates that her temporary job accommodations are in place until 11/13/2013.  3. Benefits: No change in LTD status. As outlined in prior reports, disability benefits through [employer] for coverage during her medical leave of absence and in relation to reduction from full to part time work stemming from MS-related health challenge was denied. Patient reports that she has not been paid by [employer].  4. Adjustment to disability: Counseling provided in relation to the impact of health symptoms on  life activity and related adjustment needs.   5. Goals:    Reviewed in relation to the patient's interests, experience and health related limitations.   Patient will continue consultation with her established medical team on the impact of her health symptoms on work activity. Patient expresses hope that there may be opportunities at work to focus more exclusively on personal training job duties in the new year after the holiday sales rush is over. The patient will continue to consult with this counselor in relation to her health related employment challenges and related needs.   Ms. Gossen will return to Rehabilitation Counseling for additional support with goal setting, identification of resources, development of compensatory strategies and adjustment to disability counseling.    PLAN  1. This plan is developed with the direct participation and consent of the Ms. Hulet.  2. This counselor remains available to Ms. Otterness by request for further treatment services.  3. This counselor remains available to other members of the patient's healthcare team for further treatment planning.    Debbora Presto, MS, [facility]  Rehabilitation Counselor  [facility]  [address]  [id number]  [place], [place] [address]

## 2013-10-23 ENCOUNTER — Ambulatory Visit: Payer: No Typology Code available for payment source

## 2013-10-23 ENCOUNTER — Encounter (HOSPITAL_BASED_OUTPATIENT_CLINIC_OR_DEPARTMENT_OTHER): Payer: Self-pay | Admitting: Rehabilitative and Restorative Service Providers"

## 2013-11-07 ENCOUNTER — Ambulatory Visit: Payer: No Typology Code available for payment source | Attending: Hand Surgery

## 2013-11-07 ENCOUNTER — Other Ambulatory Visit (HOSPITAL_BASED_OUTPATIENT_CLINIC_OR_DEPARTMENT_OTHER): Payer: Self-pay | Admitting: Hand Surgery

## 2013-11-07 DIAGNOSIS — G8918 Other acute postprocedural pain: Secondary | ICD-10-CM

## 2013-11-07 DIAGNOSIS — J4489 Other specified chronic obstructive pulmonary disease: Secondary | ICD-10-CM | POA: Insufficient documentation

## 2013-11-07 DIAGNOSIS — Z87891 Personal history of nicotine dependence: Secondary | ICD-10-CM | POA: Insufficient documentation

## 2013-11-07 DIAGNOSIS — M25549 Pain in joints of unspecified hand: Secondary | ICD-10-CM

## 2013-11-07 DIAGNOSIS — Z472 Encounter for removal of internal fixation device: Secondary | ICD-10-CM

## 2013-11-07 DIAGNOSIS — G35D Multiple sclerosis, unspecified: Secondary | ICD-10-CM | POA: Insufficient documentation

## 2013-11-07 DIAGNOSIS — M24849 Other specific joint derangements of unspecified hand, not elsewhere classified: Secondary | ICD-10-CM

## 2013-11-07 DIAGNOSIS — T8489XA Other specified complication of internal orthopedic prosthetic devices, implants and grafts, initial encounter: Secondary | ICD-10-CM | POA: Insufficient documentation

## 2013-11-07 DIAGNOSIS — G4730 Sleep apnea, unspecified: Secondary | ICD-10-CM | POA: Insufficient documentation

## 2013-11-07 DIAGNOSIS — G35 Multiple sclerosis: Secondary | ICD-10-CM | POA: Insufficient documentation

## 2013-11-07 DIAGNOSIS — T84498A Other mechanical complication of other internal orthopedic devices, implants and grafts, initial encounter: Secondary | ICD-10-CM

## 2013-11-07 DIAGNOSIS — K5190 Ulcerative colitis, unspecified, without complications: Secondary | ICD-10-CM | POA: Insufficient documentation

## 2013-11-07 DIAGNOSIS — F319 Bipolar disorder, unspecified: Secondary | ICD-10-CM | POA: Insufficient documentation

## 2013-11-07 DIAGNOSIS — J449 Chronic obstructive pulmonary disease, unspecified: Secondary | ICD-10-CM | POA: Insufficient documentation

## 2013-11-07 DIAGNOSIS — M79609 Pain in unspecified limb: Secondary | ICD-10-CM | POA: Insufficient documentation

## 2013-11-20 ENCOUNTER — Ambulatory Visit
Payer: No Typology Code available for payment source | Attending: Family Medicine | Admitting: Rehabilitative and Restorative Service Providers"

## 2013-11-20 DIAGNOSIS — G35 Multiple sclerosis: Secondary | ICD-10-CM

## 2013-11-20 DIAGNOSIS — G35D Multiple sclerosis, unspecified: Secondary | ICD-10-CM | POA: Insufficient documentation

## 2013-11-21 ENCOUNTER — Encounter (HOSPITAL_BASED_OUTPATIENT_CLINIC_OR_DEPARTMENT_OTHER): Payer: Self-pay | Admitting: Hand Surgery

## 2013-11-21 ENCOUNTER — Ambulatory Visit: Payer: No Typology Code available for payment source | Attending: Hand Surgery | Admitting: Hand Surgery

## 2013-11-21 VITALS — BP 109/71 | HR 90 | Temp 98.4°F | Ht 68.0 in | Wt 198.0 lb

## 2013-11-21 DIAGNOSIS — M20019 Mallet finger of unspecified finger(s): Secondary | ICD-10-CM | POA: Insufficient documentation

## 2013-11-21 DIAGNOSIS — Z09 Encounter for follow-up examination after completed treatment for conditions other than malignant neoplasm: Secondary | ICD-10-CM

## 2013-11-21 NOTE — Progress Notes (Signed)
Tilman NeatLara Lynn Nichols is a 48 year old female who is now 2 weeks s/p left thumb MC to index finger MC arthrodesis with distal radius autograft. Her surgery date was 11/07/13. She currently rates her pain as 5 out of 10. She denies any numbness in the thumb.  Denies any fevers or chills and Denies erythema or purulent drainage.. Patient has been in a post-op splint since surgery. She is not currently doing therapy or home exercises. Patient rates her satisfaction with this procedure as not-rated.     EXAM  Incision is clean and dry, intact, no dehiscence, well-healed.  Sensation to light touch is intact over the median, radial, and ulnar nerve distribution.  Sensation to light touch is intact over the radial/ulnar aspect of the thumb    STUDIES  X-rays of the left thumb show s/p procedure above. Alignment is unchanged since intraoperative images. No evidence of hardware loosening.    IMPRESSION  2 weeks s/p left thumb MC to IF MC fusion.    PLAN  Sutures removed in clinic today.  Placement into short arm thumb spica cast for another 4 weeks  Follow-up in clinic in 4 weeks with repeat 3 view of L thumb  ROM of digits 2-5 as tolerated within confines of cast.       Rebecca PontEmily Dashley Monts, MD  Resident, Dept of Orthopaedics and Sports Medicine  [facility]    ATTENDING STATEMENT:    I personally evaluated and examined the patient above, and agree with the findings, impression, and plan outlined above.       Rebecca FallenJerry I. Huang, MD  [facility]  Department of Orthopaedics and Sports Medicine  Hand, Wrist, and Elbow Surgery

## 2013-11-24 NOTE — Progress Notes (Signed)
Applied right thumb spica cast as per orders.  I reviewed CNS and hygiene protocol with patient.  Stephen Gaucher, Orthopaedic Technician 2  [facility]

## 2013-11-28 NOTE — Progress Notes (Signed)
[name]  REHABILITATION COUNSELING INTERVENTION      Joscelyn Hardrick, [id number]    Billing code: [phone] 4 (1 hour face-to-face Rehabilitation Counseling intervention with patient to provide counseling to modify the psychological, behavioral, cognitive, and social factors affecting the patient's well-being including employment).     ICD-9 code:  (340) Multiple sclerosis  (primary encounter diagnosis)    Referral Source:  Jerilee Hoh, MD, with [facility], Multiple Sclerosis [facility].    Services: Rehabilitation Counseling outpatient services are requested due to the negative impact of the patient's health condition on both daily life and employment activities.  Rehabilitation Counseling, evaluation and therapeutic treatment services are requested.    This counselor is familiar with the patient from prior Rehabilitation Counseling treatment services. Please refer to Ms. Whiteman's medical record for additional information on her medical history, current medical status, medications and treatment plan.    HISTORY     Bentleigh Waren is a 48 year old female with history of Multiple Sclerosis      Past Medical History   Diagnosis Date   . Anxiety state, unspecified    . Myalgia and myositis, unspecified    . Psoriasis and similar disorders    . Generalized osteoarthrosis, unspecified site        Patient Active Problem List   Diagnosis   . MALLET FINGER   . JOINT DERANGEMENT NEC-HAND   . Multiple sclerosis       Past Surgical History   Procedure Laterality Date   . Tonsillectomy one-half <age 76     . Bilateral thumb cmc arthroplasties     . Fibroid cysts     . Corrj hallux valgus w/wo sesmdc smpl exostectomy     . Unlisted procedure hands/fingers       L thumb [place] arthoplasty x3     This counselor is familiar with the patient from prior services Rehabilitation Counseling service form 08/04/2006 to 02/24/2007 and recent reassessment on 06/19/2013. Information from  recent progress notes are included below for ease of reference. Please refer to the INTERVENTION section for more recent updates. Please note that patient had a last name change from [place] to [place].    Patient reports that she receives care for MS from Dr. Owens Shark with [facility]. She reports that severe fatigue causes exhaustion which limits both employment and daily life activities. She is on Copaxone for DMT. Patient reports diagnosis of bipolar disorder since the last visit in 2008 which is medically managed by her PCP. Patient has an upcoming appointment with [facility] psychologist, Anabel Halon, Psy.D. Patient reports that she has engaged in PT for MS-related balance challenge and weakness affecting her left upper and lower extremities. From a cognitive perspective, patient reports some increased cognitive challenge with memory, planning and organization with negatively impact her job.    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   Patient was off work for several years and received disability benefits.   08/2011. 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   Work schedule: Full time, 40+ hours per week. Split days off (Friday  and Sunday). Patient often schedules medical appointments on Friday and usually rests all day on Sunday due to MS fatigue. Patient reports that she is off work, she is often in bed due to fatigue. Patient finds the transition from part to full time work schedule is very taxing and she questions if it is possible to continue due to her MS symptoms. Patient reports a prior work schedules with had consecutive  days off work were much better matched to better energy maintenance and she experienced a decline in function after schedule change with split days off work.   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 Patient found that these tasks are better matched for both her capacities as well as interests. She was recently informed that her employer will cut the training duties to 50% and require all employees to meet retail sales quotas. 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 The retail role also involves more standing and walking in the store, which the patient finds exacerbates her MS fatigue.    Medical leave of absence: Patient off work for 4-5 weeks due extreme exhaustion (MS fatigue), anxiety and panic disorder.   Patient returned to work on 07/17/2013 following a medical leave of absence. ADA based job accommodations/modification were requested and took some time for [employer] to approve. In summary ADA job accomodations approved include:   Reduction from full to 25-30 hours per week due to MS-related health limitations.   2 consecutive days off work   Job duties focus predominantly on customer 1 on 1 personal training. 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    IMPACT OF MEDICAL SYMPTOMS ON EMPLOYMENT & DAILY ACTIVITIES   While working, the patient's SO took over the majority of household chores due to the patient's severe fatigue after getting home from work and on days off work.   Patient and SO separated, so she no longer has the high level of support with IADLs that has been available from her SO. Patient fears that she  does not have the energy and overall capacity to work and independently perform IADLs.   Activity pacing strategies and energy conservation techniques were explored.    BENEFITS   Social Security Disability: Past 5 years. Patient is currently in an Extended Period of Eligibility, which is a part of an employment incentives program with [employer].    Health insurance: Employer sponsored.   Short term disability (applied by not approved) for her current medical leave of absence. Patient reports that she is eligible for up to 26 weeks of short term disability benefits.    ADJUSTMENT TO DISABILITY  Adjustment to disability counseling provided for adjustment support in relation to the impact of health symptoms on life activities and quality of life. Patient reports a limited support system. Patient reports that since the bulk of her time and energy is spent at work, she has not been able to engage in social activities or reach out for more social support. Additionally, patient anticipates her relationship with her SO will be ending, and he will be moving out of the home which leads to further social isolation.  COGNITIVE ASSESSMENT  Patient underwent a comprehensive neuropsychological evaluation (NPE) on 08/14/2013 performed by Clement Husbands, Psy.D. Patient and Dr. Logan Bores provided this counselor with a copy of the full report for review. Document is scanned into the medical record. Patient had an NPE on 08/06/2005 by Dr. Wendi Maya. On recent assessment, Dr. Logan Bores indicates the following:   Patient displayed multiple areas of functioning below expectation with some notable declines in some domains.   Current cognitive deficits: attention, working memory, vigilance and sustained attention, processing speed, motor function, executive functioning particularly with nonverbal conceptualization/novel nonverbal problem solving and flexibility of thought as well as nonverbal fluency.   Notable declines in  cognitive performance are clearly outlined in Dr. Logan Bores report.   Dr. Logan Bores indicates, ...it appears more likely that changes as determined testing, most significantly reflect a progression of her MS as related to further structural brain related changes.   Recommendations for work: Dr. Logan Bores indicates that the patient has experienced significant cognitive, physical and emotional changes that have impacted her job performance. Patient appears unable to effectively manage her job/personal/medical life while working full time given her cognitive and physical deficits and MS-related fatigue. Even with a reduced work schedule, patient continues to experience these health related challenges. Dr. Logan Bores indicates that it remains unclear whether or not engagement in productive work will remain a realistic goal due to the physical and cognitive demands of work. Dr. Logan Bores states, At least in her current job, full time work has not appeared to remain a realistic long-term option, and even less than full-time has involved challenges.Marland KitchenMarland KitchenApplying for disability may remain a viable option at this time.      INTERVENTION  During the Rehabilitation Counseling session, services focused on:  1. Medical update: Reviewed medical record and discussed health status with patient. She reports ongoing health challenge with cognition, fatigue, pain and sleep. Patient had L thumb surgery and is on a medical leave of absence from work. From an MS perspective, patient finds that she has not fully recovered from previous MS exacerbations and that her MS has progressed.  2. Employment status:    Medical leave of absence following surgery. Anticipated return to work date of 11/23/13. Patient reports extension of medical leave due to slower recovery time from surgery due to MS.   Prior to current medical leave, patient reports that over the past 2 months she has not been able to engage in basic daily activities after work due to severe fatigue. On  her day off work, she must rest and activities are limited to fatigue stemming from her work week. Patient reports that this pattern is not sustainable and expresses great concern over the potential long-term impact on her health as well as more immediate concerns regarding her inability to independently engage in IALDs and some ADLs due to her MS symptoms. Patient reports that she no longer has support in the home from a significant other or roommate and resides alone. Patient will discuss her inability to engage in IALDs and other aspects of self-care while working with Dr. Manson Passey. Patient struggles with a combination of MS related cognitive challenge, mental fatigue and physical fatigue which negatively impact both work and daily life activities.  Overall, patient indicates that her attempt to return to work has not been successful due to ongoing health related limitations, even with a reduction in schedule from full to part time and with the aforementioned job accommodations. As Dr. Logan Bores highlighted in the NPE report, the patient works  in a very stimulating environment requiring higher order attention, novel problem solving, multitasking and physical stamina, among various other abilities, she displayed deficits/impairments in many of these areas.  3. Benefits: Patient reports that short term benefits were finally paid by [employer].  4. Adjustment to disability: Counseling provided in relation to the impact of health symptoms on life activity and related adjustment needs.   5. Goals:    Reviewed in relation to the patient's interests, experience and health related limitations.   Patient will continue consultation with her established medical team on the impact of her health symptoms on work activity. The patient will continue to consult with this counselor in relation to her health related employment challenges and related needs.   Ms. Eddie CandleCummings will return to Rehabilitation Counseling for additional support  with goal setting, identification of resources, development of compensatory strategies and adjustment to disability counseling.    PLAN  1. This plan is developed with the direct participation and consent of the Ms. Quaintance.  2. This counselor remains available to Ms. Hargadon by request for further treatment services.  3. This counselor remains available to other members of the patient's healthcare team for further treatment planning.    Debbora PrestoJoe Stuckey, MS, [facility]  Rehabilitation Counselor  [facility]  [address]  [phone]  [place] [id number]

## 2013-12-05 ENCOUNTER — Ambulatory Visit
Payer: No Typology Code available for payment source | Attending: Rehabilitative and Restorative Service Providers" | Admitting: Rehabilitative and Restorative Service Providers"

## 2013-12-05 DIAGNOSIS — G35 Multiple sclerosis: Secondary | ICD-10-CM | POA: Insufficient documentation

## 2013-12-05 DIAGNOSIS — G35D Multiple sclerosis, unspecified: Secondary | ICD-10-CM | POA: Insufficient documentation

## 2013-12-11 NOTE — Progress Notes (Signed)
Rebecca Nichols  REHABILITATION COUNSELING INTERVENTION      Rebecca Nichols, [id number]    Billing code: [phone] 4 (1 hour face-to-face Rehabilitation Counseling intervention with patient to provide counseling to modify the psychological, behavioral, cognitive, and social factors affecting the patient's well-being including employment).     ICD-9 code:  (340) Multiple sclerosis  (primary encounter diagnosis)    Referral Source:  Jerilee Hoh, MD, with [facility], Multiple Sclerosis Clinic.    Services: Rehabilitation Counseling outpatient services are requested due to the negative impact of the patient's health condition on both daily life and employment activities.  Rehabilitation Counseling, evaluation and therapeutic treatment services are requested.    This counselor is familiar with the patient from prior Rehabilitation Counseling treatment services. Please refer to Rebecca Nichols medical record for additional information on her medical history, current medical status, medications and treatment plan.    HISTORY     Rebecca Nichols is a 48 year old female with history of Multiple Sclerosis      Past Medical History   Diagnosis Date   . Anxiety state, unspecified    . Myalgia and myositis, unspecified    . Psoriasis and similar disorders    . Generalized osteoarthrosis, unspecified site        Patient Active Problem List   Diagnosis   . MALLET FINGER   . JOINT DERANGEMENT NEC-HAND   . Multiple sclerosis       Past Surgical History   Procedure Laterality Date   . Tonsillectomy one-half <age 76     . Bilateral thumb cmc arthroplasties     . Fibroid cysts     . Corrj hallux valgus w/wo sesmdc smpl exostectomy     . Unlisted procedure hands/fingers       L thumb [place] arthoplasty x3     This counselor is familiar with the patient from prior services Rehabilitation Counseling service form 08/04/2006 to 02/24/2007 and recent reassessment on 06/19/2013. Information from  recent progress notes are included below for ease of reference. Please refer to the INTERVENTION section for more recent updates. Please note that patient had a last name change from [place] to [place].    Patient reports that she receives care for MS from Dr. Owens Shark with [facility]. She reports that severe fatigue causes exhaustion which limits both employment and daily life activities. She is on Copaxone for DMT. Patient reports diagnosis of bipolar disorder since the last visit in 2008 which is medically managed by her PCP. Patient has an upcoming appointment with [facility] psychologist, Anabel Halon, Psy.D. Patient reports that she has engaged in PT for MS-related balance challenge and weakness affecting her left upper and lower extremities. From a cognitive perspective, patient reports some increased cognitive challenge with memory, planning and organization with negatively impact her job.    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   Patient was off work for several years and received disability benefits.   08/2011. 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   Work schedule: Full time, 40+ hours per week. Split days off (Friday  and Sunday). Patient often schedules medical appointments on Friday and usually rests all day on Sunday due to MS fatigue. Patient reports that she is off work, she is often in bed due to fatigue. Patient finds the transition from part to full time work schedule is very taxing and she questions if it is possible to continue due to her MS symptoms. Patient reports a prior work schedules with had consecutive  days off work were much better matched to better energy maintenance and she experienced a decline in function after schedule change with split days off work.   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 Patient found that these tasks are better matched for both her capacities as well as interests. She was recently informed that her employer will cut the training duties to 50% and require all employees to meet retail sales quotas. 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 The retail role also involves more standing and walking in the store, which the patient finds exacerbates her MS fatigue.    Medical leave of absence: Patient off work for 4-5 weeks due extreme exhaustion (MS fatigue), anxiety and panic disorder.   Patient returned to work on 07/17/2013 following a medical leave of absence. ADA based job accommodations/modification were requested and took some time for [employer] to approve. In summary ADA job accomodations approved include:   Reduction from full to 25-30 hours per week due to MS-related health limitations.   2 consecutive days off work   Job duties focus predominantly on customer 1 on 1 personal training. 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    IMPACT OF MEDICAL SYMPTOMS ON EMPLOYMENT & DAILY ACTIVITIES   While working, the patient's SO took over the majority of household chores due to the patient's severe fatigue after getting home from work and on days off work.   Patient and SO separated, so she no longer has the high level of support with IADLs that has been available from her SO. Patient fears that she  does not have the energy and overall capacity to work and independently perform IADLs.   Activity pacing strategies and energy conservation techniques were explored.    BENEFITS   Social Security Disability: Past 5 years. Patient is currently in an Extended Period of Eligibility, which is a part of an employment incentives program with [employer].    Health insurance: Employer sponsored.   Short term disability (applied by not approved) for her current medical leave of absence. Patient reports that she is eligible for up to 26 weeks of short term disability benefits.    ADJUSTMENT TO DISABILITY  Adjustment to disability counseling provided for adjustment support in relation to the impact of health symptoms on life activities and quality of life. Patient reports a limited support system. Patient reports that since the bulk of her time and energy is spent at work, she has not been able to engage in social activities or reach out for more social support. Additionally, patient anticipates her relationship with her SO will be ending, and he will be moving out of the home which leads to further social isolation.  COGNITIVE ASSESSMENT  Patient underwent a comprehensive neuropsychological evaluation (NPE) on 08/14/2013 performed by Anabel Halon, Psy.D. Patient and Dr. Amalia Hailey provided this counselor with a copy of the full report for review. Document is scanned into the medical record. Patient had an NPE on 08/06/2005 by Dr. Wilhelmenia Blase. On recent assessment, Dr. Amalia Hailey indicates the following:   Patient displayed multiple areas of functioning below expectation with some notable declines in some domains.   Current cognitive deficits: attention, working memory, vigilance and sustained attention, processing speed, motor function, executive functioning particularly with nonverbal conceptualization/novel nonverbal problem solving and flexibility of thought as well as nonverbal fluency.   Notable declines in  cognitive performance are clearly outlined in Dr. Amalia Hailey report.   Dr. Amalia Hailey indicates, ...it appears more likely that changes as determined testing, most significantly reflect a progression of her MS as related to further structural brain related changes.   Recommendations for work: Dr. Amalia Hailey indicates that the patient has experienced significant cognitive, physical and emotional changes that have impacted her job performance. Patient appears unable to effectively manage her job/personal/medical life while working full time given her cognitive and physical deficits and MS-related fatigue. Even with a reduced work schedule, patient continues to experience these health related challenges. Dr. Amalia Hailey indicates that it remains unclear whether or not engagement in productive work will remain a realistic goal due to the physical and cognitive demands of work. Dr. Amalia Hailey states, At least in her current job, full time work has not appeared to remain a realistic long-term option, and even less than full-time has involved challenges.Marland KitchenMarland KitchenApplying for disability may remain a viable option at this time.      INTERVENTION  During the Rehabilitation Counseling session, services focused on:  1. Medical update: Reviewed medical record and discussed health status with patient. She reports ongoing health challenge with cognition, fatigue, pain and sleep. Patient had L thumb surgery and has a post surgery follow up in the 2nd week of February scheduled. She reports burning pain in the incision area. Patient has sleep apnea with CPAP machine mask adjustment fitting appointment scheduled with her sleep specialist. She met with Dr. Owens Shark last week and was prescribed modafinil for fatigue. She has an appointment scheduled with Dr. Amalia Hailey later this week. Patient reports increase in cognitive challenge including memory, retention of information and information processing which she will discuss with Dr. Amalia Hailey. She reports participation in a  research study through [place] using a lidocaine patch prior to injection. Overall from an MS perspective, patient finds that she has not fully recovered from previous MS exacerbations, that her MS has progressed and symptoms/limitations negatively impact both work and daily life activities.  2. Employment status:    Patient returned to work on 11/23/2013 from a medical leave of absence following the aforementioned surgery.She discussed with Dr. Owens Shark that she returned to work too soon and reinitiated medical leave of absence estimated from 12/02/2013 to end of February. Patient reports that her short-term disability benefits are still active.   Patient finds it very difficult to balance work, even part time, with maintenance of basic daily activities. For example, she reports overbooking her schedule with 3 medical appointments and anticipates being exhausted and unable to engage in activities this evening even without working today. She finds that 2 appointments are the max she can manage at this time.   As noted in prior reports, for many months now the patient has not been able to engage in basic daily activities after work  due to severe fatigue. On her day off work, she must rest and activities are limited to fatigue stemming from her work week. Patient reports that this pattern is not sustainable and expresses great concern over the potential long-term impact on her health as well as more immediate concerns regarding her inability to independently engage in IALDs and some ADLs due to her MS symptoms. Patient reports that she no longer has support in the home from a significant other or roommate and resides alone. Patient will discuss her inability to engage in IALDs and other aspects of self-care while working with Dr. Owens Shark. Patient struggles with a combination of MS related cognitive challenge, mental fatigue and physical fatigue which negatively impact both work and daily life activities.  Overall,  patient indicates that her attempt to return to work has not been successful due to ongoing health related limitations, even with a reduction in schedule from full to part time and with the aforementioned job accommodations. As Dr. Amalia Hailey highlighted in the NPE report, the patient works in a very stimulating environment requiring higher order attention, novel problem solving, multitasking and physical stamina, among various other abilities, she displayed deficits/impairments in many of these areas.  3. Benefits: Patient reports that short term benefits were finally paid by [employer].  4. Adjustment to disability: Counseling provided in relation to the impact of health symptoms on life activity and related adjustment needs.   5. Goals:    Reviewed in relation to the patient's interests, experience and health related limitations.   Patient will continue consultation with her established medical team on the impact of her health symptoms on work activity and daily life activities. The patient will continue to consult with this counselor in relation to her health related employment challenges and related needs.   Rebecca Nichols will return to Rehabilitation Counseling for additional support with goal setting, identification of resources, development of compensatory strategies and adjustment to disability counseling.    PLAN  1. This plan is developed with the direct participation and consent of the Rebecca Nichols.  2. This counselor remains available to Rebecca Nichols by request for further treatment services.  3. This counselor remains available to other members of the patient's healthcare team for further treatment planning.    Levester Fresh, [place], [place]  [id number]  [place] [address]

## 2013-12-19 ENCOUNTER — Ambulatory Visit: Payer: No Typology Code available for payment source | Attending: Hand Surgery | Admitting: Hand Surgery

## 2013-12-19 ENCOUNTER — Ambulatory Visit
Payer: No Typology Code available for payment source | Attending: Hand Surgery | Admitting: Rehabilitative and Restorative Service Providers"

## 2013-12-19 VITALS — BP 104/64 | HR 98 | Temp 99.1°F | Ht 68.0 in | Wt 198.0 lb

## 2013-12-19 DIAGNOSIS — M189 Osteoarthritis of first carpometacarpal joint, unspecified: Secondary | ICD-10-CM

## 2013-12-19 DIAGNOSIS — Z09 Encounter for follow-up examination after completed treatment for conditions other than malignant neoplasm: Secondary | ICD-10-CM | POA: Insufficient documentation

## 2013-12-19 DIAGNOSIS — IMO0001 Reserved for inherently not codable concepts without codable children: Secondary | ICD-10-CM

## 2013-12-19 DIAGNOSIS — M25649 Stiffness of unspecified hand, not elsewhere classified: Secondary | ICD-10-CM | POA: Insufficient documentation

## 2013-12-19 DIAGNOSIS — M19049 Primary osteoarthritis, unspecified hand: Secondary | ICD-10-CM | POA: Insufficient documentation

## 2013-12-19 DIAGNOSIS — Z4789 Encounter for other orthopedic aftercare: Secondary | ICD-10-CM

## 2013-12-19 DIAGNOSIS — M25549 Pain in joints of unspecified hand: Secondary | ICD-10-CM | POA: Insufficient documentation

## 2013-12-19 DIAGNOSIS — M79642 Pain in left hand: Secondary | ICD-10-CM

## 2013-12-19 MED ORDER — HYDROCODONE-ACETAMINOPHEN 10-325 MG OR TABS
1.0000 | ORAL_TABLET | Freq: Four times a day (QID) | ORAL | Status: AC | PRN
Start: 2013-12-19 — End: ?

## 2013-12-19 NOTE — Progress Notes (Signed)
Rebecca NeatLara Lynn Nichols is a 48 year old female who is now 6 weeks s/p left thumb MC to index finger MC arthrodesis with distal radius autograft. Her surgery date was 11/07/13. She currently rates her pain as 8 out of 10. She denies any numbness in the thumb.  Patient has been in a fiberglass cast.     EXAM  Incision is well-healed in the left thumb  Sensation to light touch is intact over the radial/ulnar aspect of the thumb  Thumb IP motion 0 to 30 degrees    STUDIES  X-rays of the left thumb show s/p thumb MC to IF MC arthrodesis with screws in good position with some interval callous.  No evidence of hardware loosening.    IMPRESSION  6 weeks s/p left thumb MC to IF MC fusion. Doing well.    PLAN  Referral to therapy for thumb spica thermoplast splint  Active ROM digits and thumb IP motion  Follow-up in clinic in 6 weeks with repeat 3 view of L thumb  NWB L hand still      Azucena FallenJerry I. Doralee Kocak, MD  [facility]  Department of Orthopaedics and Sports Medicine  Hand, Wrist, and Elbow Surgery

## 2013-12-19 NOTE — Progress Notes (Signed)
Occupational Therapy Hand Clinic Note: [facility]    Time In: 1030    Duration: 45 minutes  Date of onset: 11/07/13  The following patient identifiers were confirmed with the patient: name and date of birth.    Referring Provider: Vita ErmJerry Iming Huang  Referring provider NPI: [id number]    Diagnosis: s/p Left thumb MC to IF [facility] arthrodesis for [facility]    Treatment/therapy diagnosis: Stiff and painful hand    Precautions: No heavy lifting or pinching for 3 months post op.    History: Patient is right hand dominant female who had the above surgery by Dr. Renaldo ReelHuang on 11/07/13. She saw him today for a follow up visit and she was sent to therapy. Her medical history includes multiple sclerosis that was diagnosed in 2000 and she had an exacerbation of the MS last summer. She also has bipolar disorder, bulging discs in her low back and bilateral thumb OA.    Fall risk: not fallen in the past year.    Tilman NeatLara Lynn Grajales arrived on this date [facility] with orders for therapy to provide: A/PROM, edema control, scar management, forearm based thumb spica orthosis    Barriers to learning: none  Preferred learning style: demonstration, written and verbal  Cultural Practices Influencing care: none    Level of function at start of care:   Quick DASH   No difficulty - 1 Mild difficulty - 2 Moderate difficulty - 3 Severe difficulty - 4 Unable - 5   Open a tight or new jar    x    Do heavy household chores    x    Carry a shopping bag or briefcase    x    Wash your back     x   Use a knife to cut food     x   Recreational activities that take some force or impact through arm, shoulder, or hand     x   Work   x       Skilled Services/Interventions provided: Occupational Therapy evaluation for 10 minutes and orthotic fabrication using code [id number] for 35 minutes.   Fabricated forearm based thumb spica orthosis with multiple adjustments needed at the time as patient has had a thumb  spica orthosis before and knows what does and doesn't work for her. Instructed in wear and care of orthosis. Patient denied need for ROM education from therapist as she has done them several times before and she feels confident that she won't have any problem with them.    Current Impairments:  Pain: The patient is reporting 6/10 level of pain, on a 0-10 Numeric Pain Distress Scale.    Range of Motion: Not taken, patient denied need as well as not requiring assistance with ROM HEP.    Strength: Surgically restricted from strengthening, not tested.    Wound status: Healed.    Sensation: Normal sensation per patient report.    Provocative tests: NA    Patient's participation in home program:   Patient verbalizes comprehension of the use of the orthosis, and was able to don and doff orthosis independently and correctly.    GOALS:   1. Able to use a knife and fork to cut softer food with 2/10 pain by 03/13/14.  2. Able to carry a light grocery bag from the car to her house with her left thumb with 2/10 pain by 03/13/14.    These goals were discussed  and the patient agrees with them.  Rehab potential: Excellent    PLAN:   Frequency/Duration: This patient was seen for a one time session for customized orthosis fabrication. Patient to return to clinic if orthosis needs adjustments. Patient prefers to do ROM exercises on her own.  The therapy will include: orthotic management     Patient may also be seen by an Occupational Therapy Assistant. The plan of care has been reviewed with the [place] Patent examiner). Patient to see OTR every 1-2 visits for re-assessment of status and plan of care update.

## 2013-12-21 ENCOUNTER — Telehealth (HOSPITAL_BASED_OUTPATIENT_CLINIC_OR_DEPARTMENT_OTHER): Payer: Self-pay | Admitting: Rehabilitative and Restorative Service Providers"

## 2013-12-21 ENCOUNTER — Telehealth (HOSPITAL_BASED_OUTPATIENT_CLINIC_OR_DEPARTMENT_OTHER): Payer: Self-pay | Admitting: Hand Surgery

## 2013-12-21 ENCOUNTER — Ambulatory Visit (HOSPITAL_BASED_OUTPATIENT_CLINIC_OR_DEPARTMENT_OTHER): Payer: No Typology Code available for payment source | Admitting: Rehabilitative and Restorative Service Providers"

## 2013-12-21 DIAGNOSIS — Z4789 Encounter for other orthopedic aftercare: Secondary | ICD-10-CM

## 2013-12-21 DIAGNOSIS — IMO0001 Reserved for inherently not codable concepts without codable children: Secondary | ICD-10-CM

## 2013-12-21 DIAGNOSIS — M79642 Pain in left hand: Secondary | ICD-10-CM

## 2013-12-21 NOTE — Telephone Encounter (Signed)
Pt was seen in clinic on 12/19/13, s/p left thumb MC to index finger MC arthrodesis with distal radius autograft. Her surgery date was 11/07/13.  PLAN  Referral to therapy for thumb spica thermoplast splint  Active ROM digits and thumb IP motion  Follow-up in clinic in 6 weeks with repeat 3 view of L thumb  NWB L hand still.  Pt was given hydrocodone 10/325 mg tabs # 40.  Pt has a F/U appt on 01/30/14.    We [facility] letting us know Pt received a prescription for Norco 5/325 mg # 240 on 11/29/13. When I called the Pharmacist he stated she had already picked up this prescription. He stated Pt gets this prescription monthly from Dr Argentina DonovanKyle Oh [facility].  I will pass this information on to Dr Phylliss BobHuang's Team.

## 2013-12-21 NOTE — Telephone Encounter (Signed)
CONFIRMED PHONE NUMBER:   Telephone Information:   Home Phone [phone]   Work Phone [phone]   Mobile [phone]       CALLERS FIRST AND LAST NAME: Marcell Escovedo  CALLERS RELATIONSHIP:Self  RETURN CALL: General message OK     SUBJECT: Appointment Request   REASON FOR REQUEST: Patient has a removable cast that is causing her pain.  It was provided on 12/19/13 and patient wants to have it fixed before she goes out of town.  Patient also would like to see another therapist named "Dee"      REQUEST APPOINTMENT WITH: Dee (sp?)  REFERRING PROVIDER: Self  REQUESTED DATE: 12/21/13  REQUESTED TIME: Ask patient  UNABLE TO APPOINT: Other: If patient wants to change therapists, SOP instructs to send message

## 2013-12-21 NOTE — Progress Notes (Signed)
Occupational Therapy Progress Note    Today's date: 12/21/2013    Time in: 1300    Duration: 20 minutes   Date of initial note for this episode of care: 12/19/13  Diagnosis: s/p Left thumb MC to IF MC arthrodesis for failed CMC arthroplasty    Treatment/therapy diagnosis: Stiff and painful hand    Precautions: No heavy lifting or pinching for 3 months post op    Patient Identifiers:  The following patient identifiers were confirmed with the patient: name and date of birth.    PAIN: The patient is reporting 6/10 level of pain, on a 0-10 Numeric Pain Distress Scale.    Skilled services/interventions provided: Orthotic Management for 20 minutes.  Patient was going out of town early this afternoon and needed to have the orthosis adjusted due to discomfort. Orthosis was adjusted and patient reported that is was much more comfortable following adjustments.    Patient education/instruction: Will continue with orthosis use.    Patient's response to therapy: Happy with changes made to orthosis and increased comfort.    Change in impairments: None    Long term functional goals:   1. Able to use a knife and fork to cut softer food with 2/10 pain by 03/13/14.  2. Able to carry a light grocery bag from the car to her house with her left thumb with 2/10 pain by 03/13/14.    Functional improvement/progress toward each goal: None at this time.    Functional limitation(s) remaining requiring continued skilled services: Pain continues to be main limiting factor with return to full functional use. Goals have not been met.    Any changes in plan of treatment: Patient will return to therapy clinic for further orthosis adjustments if needed.    Patient may also be seen by an Occupational Therapy Assistant. The plan of care has been reviewed with the COTA (Certified Occupational Therapy Assistant). Patient to see OTR every 1-2 visits for re-assessment of status and plan of care update.

## 2013-12-21 NOTE — Telephone Encounter (Signed)
Tu, pharmacist called: [phone]  #3    Patient had 240  - [id number] Norco filled on January 21. Different Doctor    Please call to confirm    Haskell Riling, PSS  [facility]  [facility]  [facility]

## 2014-01-02 ENCOUNTER — Ambulatory Visit
Payer: No Typology Code available for payment source | Attending: Physical Medicine & Rehabilitation | Admitting: Rehabilitative and Restorative Service Providers"

## 2014-01-02 DIAGNOSIS — G35D Multiple sclerosis, unspecified: Secondary | ICD-10-CM | POA: Insufficient documentation

## 2014-01-02 DIAGNOSIS — G35 Multiple sclerosis: Secondary | ICD-10-CM

## 2014-01-09 NOTE — Progress Notes (Signed)
[place] OF [facility]  REHABILITATION COUNSELING INTERVENTION      [name], [id number]    Billing code: 54098 x 3 (45 minutes face-to-face Rehabilitation Counseling intervention with patient to provide counseling to modify the psychological, behavioral, cognitive, and social factors affecting the patient's well-being including employment).     ICD-9 code:  (340) Multiple sclerosis  (primary encounter diagnosis)    Referral Source:  Neita Carp, MD, with [facility], Multiple Sclerosis Clinic.    Services: Rehabilitation Counseling outpatient services are requested due to the negative impact of the patient's health condition on both daily life and employment activities.  Rehabilitation Counseling, evaluation and therapeutic treatment services are requested.    This counselor is familiar with the patient from prior Rehabilitation Counseling treatment services. Please refer to Rebecca Nichols for additional information on her medical history, current medical status, medications and treatment plan.    HISTORY     Rebecca Nichols is a 48 year old female with history of Multiple Sclerosis      Past Medical History   Diagnosis Date   . Anxiety state, unspecified    . Myalgia and myositis, unspecified    . Psoriasis and similar disorders    . Generalized osteoarthrosis, unspecified site        Patient Active Problem List   Diagnosis   . MALLET FINGER   . JOINT DERANGEMENT NEC-HAND   . Multiple sclerosis       Past Surgical History   Procedure Laterality Date   . Tonsillectomy one-half <age 58     . Bilateral thumb cmc arthroplasties     . Fibroid cysts     . Corrj hallux valgus w/wo sesmdc smpl exostectomy     . Unlisted procedure hands/fingers       L thumb CMC arthoplasty x3     This counselor is familiar with the patient from prior services Rehabilitation Counseling service form 08/04/2006 to 02/24/2007 and recent reassessment on 06/19/2013. Information  from recent progress notes are included below for ease of reference. Please refer to the INTERVENTION section for more recent updates. Please note that patient had a last name change from [place] to [place].    Patient reports that she receives care for MS from Dr. Manson Passey with [facility]. She reports that severe fatigue causes exhaustion which limits both employment and daily life activities. She is on Copaxone for DMT. Patient reports diagnosis of bipolar disorder since the last visit in 2008 which is medically managed by her PCP. Patient has an upcoming appointment with [facility] psychologist, Clement Husbands, Psy.D. Patient reports that she has engaged in PT for MS-related balance challenge and weakness affecting her left upper and lower extremities. From a cognitive perspective, patient reports some increased cognitive challenge with memory, planning and organization with negatively impact her job.    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   Patient was off work for several years and received disability benefits.   08/2011. 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   Work schedule: Full time, 40+ hours per week. Split days off (Friday  and Sunday). Patient often schedules medical appointments on Friday and usually rests all day on Sunday due to MS fatigue. Patient reports that she is off work, she is often in bed due to fatigue. Patient finds the transition from part to full time work schedule is very taxing and she questions if it is possible to continue due to her MS symptoms. Patient reports a prior work schedules with had  consecutive days off work were much better matched to better energy maintenance and she experienced a decline in function after schedule change with split days off work.   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 Patient found that these tasks are better matched for both her capacities as well as interests. She was recently informed that her employer will cut the training duties to 50% and require all employees to meet retail sales quotas. 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 The retail role also involves more standing and walking in the store, which the patient finds exacerbates her MS fatigue.    Medical leave of absence: Patient off work for 4-5 weeks due extreme exhaustion (MS fatigue), anxiety and panic disorder.   Patient returned to work on 07/17/2013 following a medical leave of absence. ADA based job accommodations/modification were requested and took some time for [employer] to approve. In summary ADA job accomodations approved include:   Reduction from full to 25-30 hours per week due to MS-related health limitations.   2 consecutive days off work   Job duties focus predominantly on customer 1 on 1 personal training. During the holiday season, training is not offered, and the patient will work closely with her manager on her part-time work in Physicist, medicalretail sales during this limited timeframe.    IMPACT OF MEDICAL SYMPTOMS ON EMPLOYMENT & DAILY ACTIVITIES   While working, the patient's SO took over the majority of household chores due to the patient's severe fatigue after getting home from work and on days off work.   Patient and SO separated, so she no longer has the high level of support with IADLs that has been available from her SO. Patient  fears that she does not have the energy and overall capacity to work and independently perform IADLs.   Activity pacing strategies and energy conservation techniques were explored.    BENEFITS   Social Security Disability: Past 5 years. Patient is currently in an Extended Period of Eligibility, which is a part of an employment incentives program with [employer].    Health insurance: Employer sponsored.   Short term disability (applied by not approved) for her current medical leave of absence. Patient reports that she is eligible for up to 26 weeks of short term disability benefits.    ADJUSTMENT TO DISABILITY  Adjustment to disability counseling provided for adjustment support in relation to the impact of health symptoms on life activities and quality of life. Patient reports a limited support system. Patient reports that since the bulk of her time and energy is spent at work, she has not been able to engage in social activities or reach out for more social support. Additionally, patient anticipates her relationship with her SO will be ending, and he will be moving out of the home which leads to further social isolation.  COGNITIVE ASSESSMENT  Patient underwent a comprehensive neuropsychological evaluation (NPE) on 08/14/2013 performed by Clement Husbands, Psy.D. Patient and Dr. Logan Bores provided this counselor with a copy of the full report for review. Document is scanned into the medical Nichols. Patient had an NPE on 08/06/2005 by Dr. Wendi Maya. On recent assessment, Dr. Logan Bores indicates the following:   Patient displayed multiple areas of functioning below expectation with some notable declines in some domains.   Current cognitive deficits: attention, working memory, vigilance and sustained attention, processing speed, motor function, executive functioning particularly with nonverbal conceptualization/novel nonverbal problem solving and flexibility of thought as well as nonverbal fluency.   Notable  declines in cognitive performance are clearly outlined in Dr. Logan Bores report.   Dr. Logan Bores indicates, ...it appears more likely that changes as determined testing, most significantly reflect a progression of her MS as related to further structural brain related changes.   Recommendations for work: Dr. Logan Bores indicates that the patient has experienced significant cognitive, physical and emotional changes that have impacted her job performance. Patient appears unable to effectively manage her job/personal/medical life while working full time given her cognitive and physical deficits and MS-related fatigue. Even with a reduced work schedule, patient continues to experience these health related challenges. Dr. Logan Bores indicates that it remains unclear whether or not engagement in productive work will remain a realistic goal due to the physical and cognitive demands of work. Dr. Logan Bores states, At least in her current job, full time work has not appeared to remain a realistic long-term option, and even less than full-time has involved challenges.Marland KitchenMarland KitchenApplying for disability may remain a viable option at this time.      INTERVENTION  During the Rehabilitation Counseling session, services focused on:  1. Medical update: Reviewed medical Nichols and discussed health status with patient. Patient had L thumb surgery, reports that it is healing well and must continue to wear a brace for another 6 weeks. Patient reports recommendation to restrict use at this time. Reeval scheduled in 3rd week of March with possible referral to PT or rehab exercises at that time. Patient reports increase in cognitive challenge including memory, retention of information and information processing and she continues to discuss symptoms and compensatory strategies with with Dr. Logan Bores. Overall from an MS perspective, patient finds that she has not fully recovered from previous MS exacerbations, that her MS has progressed and symptoms/limitations negatively  impact both work and daily life activities. She reports ongoing health challenge with cognition, fatigue, pain and sleep.  2. Employment status:   Medical leave of absence (MLOA) scheduled through the end of February. Patient will discuss with Dr. Manson Passey extending the [facility]. Patient finds that she is somewhat better able to manage IADLs when she is not working and lacks the energy to do so when working. Patient will consult with Dr. Manson Passey on the impact of symptoms on work and daily life activities.   Patient returned to work on 11/23/2013 from a medical leave of absence following the aforementioned surgery.She discussed with Dr. Manson Passey that she returned to work too soon and reinitiated medical leave of absence estimated from 12/02/2013 to end of February. Patient reports that her short-term disability benefits are still active.   Patient finds it very difficult to balance work, even part time, with maintenance of basic daily activities. For example, she reports overbooking her schedule with 3 medical appointments and anticipates being exhausted and unable to engage in activities this evening even without working today. She finds that 2  appointments are the max she can manage at this time.   As noted in prior reports, for many months now the patient has not been able to engage in basic daily activities after work due to severe fatigue. On her day off work, she must rest and activities are limited to fatigue stemming from her work week. Patient reports that this pattern is not sustainable and expresses great concern over the potential long-term impact on her health as well as more immediate concerns regarding her inability to independently engage in IALDs and some ADLs due to her MS symptoms. Patient reports that she no longer has support in the home from a significant other or roommate and resides alone. Patient will discuss her inability to engage in IALDs and other aspects of self-care while working with Dr. Manson Passey.  Patient struggles with a combination of MS related cognitive challenge, mental fatigue and physical fatigue which negatively impact both work and daily life activities.  Overall, patient indicates that her attempt to return to work has not been successful due to ongoing health related limitations, even with a reduction in schedule from full to part time and with the aforementioned job accommodations. As Dr. Logan Bores highlighted in the NPE report, the patient works in a very stimulating environment requiring higher order attention, novel problem solving, multitasking and physical stamina, among various other abilities, she displayed deficits/impairments in many of these areas.  3. Benefits: Patient reports that short term benefits were finally paid by [employer].  4. Adjustment to disability: Counseling provided in relation to the impact of health symptoms on life activity and related adjustment needs.   5. Goals:    Reviewed in relation to the patient's interests, experience and health related limitations.   Patient will continue consultation with her established medical team on the impact of her health symptoms on work activity and daily life activities. The patient will continue to consult with this counselor in relation to her health related employment challenges and related needs.   Rebecca Nichols will return to Rehabilitation Counseling for additional support with goal setting, identification of resources, development of compensatory strategies and adjustment to disability counseling.    PLAN  1. This plan is developed with the direct participation and consent of the Rebecca Nichols.  2. This counselor remains available to Rebecca Nichols for further treatment services.  3. This counselor remains available to other members of the patient's healthcare team for further treatment planning.    Debbora Presto, MS, [facility]  Rehabilitation Counselor  [facility]  [address]  [id number]  [place], [place] [address]

## 2014-01-30 ENCOUNTER — Ambulatory Visit
Payer: No Typology Code available for payment source | Attending: Hand Surgery | Admitting: Rehabilitative and Restorative Service Providers"

## 2014-01-30 ENCOUNTER — Ambulatory Visit: Payer: No Typology Code available for payment source | Attending: Hand Surgery | Admitting: Hand Surgery

## 2014-01-30 ENCOUNTER — Ambulatory Visit (HOSPITAL_BASED_OUTPATIENT_CLINIC_OR_DEPARTMENT_OTHER): Payer: No Typology Code available for payment source | Admitting: Rehabilitative and Restorative Service Providers"

## 2014-01-30 VITALS — BP 126/75 | HR 95 | Temp 98.1°F

## 2014-01-30 DIAGNOSIS — M19049 Primary osteoarthritis, unspecified hand: Secondary | ICD-10-CM | POA: Insufficient documentation

## 2014-01-30 DIAGNOSIS — G35 Multiple sclerosis: Secondary | ICD-10-CM

## 2014-01-30 DIAGNOSIS — M189 Osteoarthritis of first carpometacarpal joint, unspecified: Secondary | ICD-10-CM

## 2014-01-30 DIAGNOSIS — IMO0001 Reserved for inherently not codable concepts without codable children: Secondary | ICD-10-CM | POA: Insufficient documentation

## 2014-01-30 DIAGNOSIS — M25539 Pain in unspecified wrist: Secondary | ICD-10-CM | POA: Insufficient documentation

## 2014-01-30 DIAGNOSIS — Z4789 Encounter for other orthopedic aftercare: Secondary | ICD-10-CM

## 2014-01-30 DIAGNOSIS — M25549 Pain in joints of unspecified hand: Secondary | ICD-10-CM | POA: Insufficient documentation

## 2014-01-30 DIAGNOSIS — Z09 Encounter for follow-up examination after completed treatment for conditions other than malignant neoplasm: Secondary | ICD-10-CM | POA: Insufficient documentation

## 2014-01-30 DIAGNOSIS — M79642 Pain in left hand: Secondary | ICD-10-CM

## 2014-01-30 NOTE — Patient Instructions (Signed)
Continue therapy for ROM and start light strengthening  No heavy lifting, pushing, pulling, twisting with the left thumb until radiographic union.  [place] [facility] Andria RheinEthan Wiesler. Will attempt to contact him.

## 2014-01-30 NOTE — Progress Notes (Signed)
Rebecca Nichols is a 48 year old female who is now 3  Months /p left thumb MC to index finger MC arthrodesis with distal radius autograft. Her surgery date was 11/07/13. She currently rates her pain as 7 out of 10. She denies any numbness in the thumb.  Patient has been in a thermoplast splint. She was doing better but thinks she overdid again recently. [place] so is moving boxes, but being careful.    EXAM  Incision is well-healed in the left thumb  Sensation to light touch is intact over the radial/ulnar aspect of the thumb  Thumb IP motion 0 to 60 degrees    STUDIES  X-rays of the left thumb show s/p thumb MC to IF MC arthrodesis with screws in good position with some interval callous.  No evidence of hardware loosening.    IMPRESSION  3 months s/p left thumb MC to IF MC fusion. Doing well.    PLAN  Continue therapy for ROM and start light strengthening  No heavy lifting, pushing, pulling, twisting with the left thumb until radiographic union.  [place]. [facility] Andria RheinEthan Wiesler. Will attempt to contact him.      Azucena FallenJerry I. Fleur Audino, MD  [facility]  Department of Orthopaedics and Sports Medicine  Hand, Wrist, and Elbow Surgery

## 2014-02-03 DIAGNOSIS — M79642 Pain in left hand: Secondary | ICD-10-CM | POA: Insufficient documentation

## 2014-02-03 DIAGNOSIS — Z4789 Encounter for other orthopedic aftercare: Secondary | ICD-10-CM | POA: Insufficient documentation

## 2014-02-03 NOTE — Progress Notes (Signed)
Occupational Therapy Hand Clinic Note: [facility], [facility]    Time In: 10:30 AM    Duration: 15 minutes    Date of onset: 11/07/2013  The following patient identifiers were confirmed with the patient: name and date of birth.      Referring Provider: Vita Erm  Referring provider NPI: [id number]    Diagnosis: S/P left thumb Metacarpal to index metacarpal arthrodesis for failed [facility] arthroplasty    Treatment/therapy diagnosis: Left hand/wrist pain    History: The patient is a 48 year old female with a history of a revision left thumb CMC arthroplasty who presents to clinic with pain over the left thumb basilar joint. Radiographs demonstrate a subsidence of the prior revision thumb CMC arthroplasty with impingement of the submetacarpal onto the scaphoid. The patient has had several prior procedures of the left thumb CMC joint with subsequent failure and subsidence.  She underwent the above procedure on 11/07/2013, and has previously been seen in this clinic for orthosis fabrication.        Fall risk: a low risk for falls.    Rebecca Nichols arrived on this date from the [facility] with orders for therapy to provide: orthosis adjustments    Barriers to learning: none  Preferred learning style: demonstration, written and verbal  Cultural Practices Influencing care: none    Level of function at start of care: Patient is currently under surgical precautions, and therefore precluded from using her left hand for any bimanual activities of daily living. She can not open jar lids, lift heavy objects, chop vegetables, nor use her left hand for recreational activities.    Subjective:  Patient states that she will be moving out of state soon, and needs her splint modified. She notes that she did "break off" a portion of the orthosis that wraps around the thumb, providing full support to the thumb. She has another appointment at [facility] and is not available to stay for fabrication of a new  orthosis.    Skilled Services/Interventions provided: Orthotic Management for 15 minutes.  The entire treatment session was spent on refurbishing previously fabricated orthosis. A 3/4" strip of thermoplastic was adhered to the distal portion of the thumb component, in an effort to provide additional support to thumb to index metacarpal arthrodesis. New velcro straps applied.      Current Impairments:  Pain: The patient is reporting 7/10 level of pain, on a 0-10 Numeric Pain Distress Scale.    Range of Motion: not tested due to patient's time constraints.    Strength: Not tested due to surgical precautions.    Patient's participation in home program:   Patient verbalizes comprehension of the use of the orthosis, and was able to don and doff orthosis independently and correctly.    GOALS:    Long term functional goal(s): (Hand)  Open a jar lid with involved hand by 05/02/2014.    These goals were discussed and the patient agrees with them.  Rehab potential: Good    PLAN: Patient may return at any time for further orthosis adjustments. She will be moving out of state within the next month, and will therefore be discharged from therapy at that time.  Frequency/Duration: This patient was seen for a one time session for customized orthosis fabrication.  The therapy will include: orthotic management     Patient may also be seen by an Occupational Therapy Assistant. The plan of care has been reviewed with the [place] Patent examiner).

## 2014-02-09 NOTE — Progress Notes (Signed)
[place] [facility]  REHABILITATION COUNSELING INTERVENTION      Rebecca NeatLara Lynn Pennie, [id number]    Billing code: [id number] x 4 (1 hour face-to-face Rehabilitation Counseling intervention with patient to provide counseling to modify the psychological, behavioral, cognitive, and social factors affecting the patient's well-being including employment).     ICD-9 code:  (340) Multiple sclerosis  (primary encounter diagnosis)    Referral Source:  Neita Carpheodore R Brown, MD, with [facility].    Services: Rehabilitation Counseling outpatient services are requested due to the negative impact of the patient's health condition on both daily life and employment activities.  Rehabilitation Counseling, evaluation and therapeutic treatment services are requested.    This counselor is familiar with the patient from prior Rehabilitation Counseling treatment services. Please refer to Ms. Ulatowski's medical record for additional information on her medical history, current medical status, medications and treatment plan.    HISTORY     Rebecca Nichols is a 48 year old female with history of Multiple Sclerosis      Past Medical History   Diagnosis Date   . Anxiety state, unspecified    . Myalgia and myositis, unspecified    . Psoriasis and similar disorders    . Generalized osteoarthrosis, unspecified site        Patient Active Problem List   Diagnosis   . MALLET FINGER   . JOINT DERANGEMENT NEC-HAND   . Multiple sclerosis   . Aftercare following surgery of the musculoskeletal system, NEC   . Left hand pain       Past Surgical History   Procedure Laterality Date   . Tonsillectomy one-half <age 53     . Bilateral thumb cmc arthroplasties     . Fibroid cysts     . Corrj hallux valgus w/wo sesmdc smpl exostectomy     . Unlisted procedure hands/fingers       L thumb CMC arthoplasty x3     This counselor is familiar with the patient from prior services Rehabilitation Counseling  service form 08/04/2006 to 02/24/2007 and recent reassessment on 06/19/2013. Information from recent progress notes are included below for ease of reference. Please refer to the INTERVENTION section for more recent updates. Please note that patient had a last name change from [place] to [place].    Patient reports that she receives care for MS from [facility] Manson [facility]. She reports that severe fatigue causes exhaustion which limits both employment and daily life activities. She is on Copaxone for DMT. Patient reports diagnosis of bipolar disorder since the last visit in 2008 which is medically managed by her PCP. Patient has an upcoming appointment with [facility] psychologist, Clement Husbandsemperance Evans, Psy.D. Patient reports that she has engaged in PT for MS-related balance challenge and weakness affecting her left upper and lower extremities. From a cognitive perspective, patient reports some increased cognitive challenge with memory, planning and organization with negatively impact her job.    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   Patient was off work for several years and received disability benefits.   08/2011. 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 Work hours increased to full time around the holidays 2013, and she was eventually hired as  a permanent employee.   Work schedule: Full time, 40+ hours per week. Split days off (Friday and Sunday). Patient often schedules medical appointments on Friday and usually rests all day on Sunday due to MS fatigue. Patient reports that she is off work, she is often in bed due to fatigue. Patient finds the transition from part to full time work schedule is very taxing and she questions if it is possible to  continue due to her MS symptoms. Patient reports a prior work schedules with had consecutive days off work were much better matched to better energy maintenance and she experienced a decline in function after schedule change with split days off work.   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 Patient found that these tasks are better matched for both her capacities as well as interests. She was recently informed that her employer will cut the training duties to 50% and require all employees to meet retail sales quotas. 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 The retail role also involves more standing and walking in the store, which the patient finds exacerbates her MS fatigue.    Medical leave of absence: Patient off work for 4-5 weeks due extreme exhaustion (MS fatigue), anxiety and panic disorder.   Patient returned to work on 07/17/2013 following a medical leave of absence. ADA based job accommodations/modification were requested and took some time for [employer] to approve. In summary ADA job accomodations approved include:   Reduction from full to 25-30 hours per week due to MS-related health limitations.   2 consecutive days off work   Job duties focus predominantly on customer 1 on 1 personal training. 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    IMPACT OF MEDICAL SYMPTOMS ON EMPLOYMENT & DAILY ACTIVITIES   While working, the patient's SO took over the majority of household chores due to the patient's severe fatigue after getting home from work and on days off work.   Patient and SO separated, so she no longer has the  high level of support with IADLs that has been available from her SO. Patient fears that she does not have the energy and overall capacity to work and independently perform IADLs.   Activity pacing strategies and energy conservation techniques were explored.    BENEFITS   Social Security Disability: Past 5 years. Patient is currently in an Extended Period of Eligibility, which is a part of an employment incentives program with [employer].    Health insurance: Employer sponsored.   Short term disability (applied by not approved) for her current medical leave of absence. Patient reports that she is eligible for up to 26 weeks of short term disability benefits.    ADJUSTMENT TO DISABILITY  Adjustment to disability counseling provided for adjustment support in relation to the impact of health symptoms on life activities and quality of life. Patient reports a limited support system. Patient reports that since the bulk of her time and energy is spent at work, she has not been able to engage in social activities or reach out for more social support. Additionally, patient anticipates her relationship with her SO will be ending, and  he will be moving out of the home which leads to further social isolation.    COGNITIVE ASSESSMENT  Patient underwent a comprehensive neuropsychological evaluation (NPE) on 08/14/2013 performed by Clement Husbands, Psy.D. Patient and Dr. Logan Bores provided this counselor with a copy of the full report for review. Document is scanned into the medical record. Patient had an NPE on 08/06/2005 by Dr. Wendi Maya. On recent assessment, Dr. Logan Bores indicates the following:   Patient displayed multiple areas of functioning below expectation with some notable declines in some domains.   Current cognitive deficits: attention, working memory, vigilance and sustained attention, processing speed, motor function, executive functioning particularly with nonverbal conceptualization/novel nonverbal  problem solving and flexibility of thought as well as nonverbal fluency.   Notable declines in cognitive performance are clearly outlined in Dr. Logan Bores report.   Dr. Logan Bores indicates, ...it appears more likely that changes as determined testing, most significantly reflect a progression of her MS as related to further structural brain related changes.   Recommendations for work: Dr. Logan Bores indicates that the patient has experienced significant cognitive, physical and emotional changes that have impacted her job performance. Patient appears unable to effectively manage her job/personal/medical life while working full time given her cognitive and physical deficits and MS-related fatigue. Even with a reduced work schedule, patient continues to experience these health related challenges. Dr. Logan Bores indicates that it remains unclear whether or not engagement in productive work will remain a realistic goal due to the physical and cognitive demands of work. Dr. Logan Bores states, At least in her current job, full time work has not appeared to remain a realistic long-term option, and even less than full-time has involved challenges.Marland KitchenMarland KitchenApplying for disability may remain a viable option at this time.      INTERVENTION  During the Rehabilitation Counseling session, services focused on:  1. Medical update: Reviewed medical record and discussed health status with patient. Patient had L thumb surgery, reports that it is healing well and must continue to wear a brace for another 6 weeks. Patient reports recommendation to restrict use at this time. Reeval scheduled in 3rd week of March with possible referral to PT or rehab exercises at that time. Patient reports increase in cognitive challenge including memory, retention of information and information processing and she continues to discuss symptoms and compensatory strategies with with Dr. Logan Bores. Overall from an MS perspective, patient finds that she has not fully recovered from previous  MS exacerbations, that her MS has progressed and symptoms/limitations negatively impact both work and daily life activities. She reports ongoing health challenge with cognition, fatigue, pain and sleep.  2. Employment status:   Medical leave of absence (MLOA) Dr. Manson Passey extended the [facility] through 02/26/2014 Patient finds that she is somewhat better able to manage IADLs when she is not working and lacks the energy to do so when working. Patient will continue to consult with Dr. Manson Passey on the impact of symptoms on work and daily life activities.   Patient returned to work on 11/23/2013 from a medical leave of absence following the aforementioned surgery.She discussed with Dr. Manson Passey that she returned to work too soon and reinitiated medical leave of absence estimated from 12/02/2013 to end of February. Patient reports that her short-term disability benefits are still active.   Patient finds it very difficult to balance work, even part time, with maintenance of basic daily activities. For example, she reports overbooking her schedule with 3 medical appointments and anticipates being exhausted and unable to engage in  activities this evening even without working today. She finds that 2 appointments are the max she can manage at this time.   As noted in prior reports, for many months now the patient has not been able to engage in basic daily activities after work due to severe fatigue. On her day off work, she must rest and activities are limited to fatigue stemming from her work week. Patient reports that this pattern is not sustainable and expresses great concern over the potential long-term impact on her health as well as more immediate concerns regarding her inability to independently engage in IALDs and some ADLs due to her MS symptoms. Patient reports that she no longer has support in the home from a significant other or roommate and resides alone. Patient will discuss her inability to engage in IALDs and other aspects  of self-care while working with Dr. Manson Passey. Patient struggles with a combination of MS related cognitive challenge, mental fatigue and physical fatigue which negatively impact both work and daily life activities.  Overall, patient indicates that her attempt to return to work has not been successful due to ongoing health related limitations, even with a reduction in schedule from full to part time and with the aforementioned job accommodations. As Dr. Logan Bores highlighted in the NPE report, the patient works in a very stimulating environment requiring higher order attention, novel problem solving, multitasking and physical stamina, among various other abilities, she displayed deficits/impairments in many of these areas.  3. Benefits: Patient reports that the maximum short term benefits duration is 02/26/2014. She reports that she is not eligible for long term disability benefits due to a pre-existing condition. Patient was a recipient of Social Security Disability in the past and will explore reinstating benefits given the limiting impact of health symptoms on employment.  4. Adjustment to disability: Counseling provided in relation to the impact of health symptoms on life activity and related adjustment needs.   5. Goals:    Reviewed in relation to the patient's interests, experience and health related limitations.   Patient will continue consultation with her established medical team on the impact of her health symptoms on work activity and daily life activities.    Ms. Coonan will return to Rehabilitation Counseling for additional support with goal setting, identification of resources, development of compensatory strategies and adjustment to disability counseling.    PLAN  1. This plan is developed with the direct participation and consent of the Ms. Mchargue.  2. This counselor remains available to Ms. Brinley by request for further treatment services.  3. This counselor remains available to other members of the  patient's healthcare team for further treatment planning.    Debbora Presto, MS, [facility]  Rehabilitation Counselor  [facility]  [address]  [id number]  [place], [place] [address]

## 2014-04-04 DIAGNOSIS — M9689 Other intraoperative and postprocedural complications and disorders of the musculoskeletal system: Secondary | ICD-10-CM | POA: Insufficient documentation

## 2014-04-18 DIAGNOSIS — M7918 Myalgia, other site: Secondary | ICD-10-CM | POA: Insufficient documentation

## 2014-05-29 DIAGNOSIS — N939 Abnormal uterine and vaginal bleeding, unspecified: Secondary | ICD-10-CM | POA: Insufficient documentation

## 2014-06-05 DIAGNOSIS — F4321 Adjustment disorder with depressed mood: Secondary | ICD-10-CM | POA: Insufficient documentation

## 2014-08-22 DIAGNOSIS — G5601 Carpal tunnel syndrome, right upper limb: Secondary | ICD-10-CM | POA: Insufficient documentation

## 2014-11-15 DIAGNOSIS — R279 Unspecified lack of coordination: Secondary | ICD-10-CM | POA: Diagnosis not present

## 2014-11-15 DIAGNOSIS — R269 Unspecified abnormalities of gait and mobility: Secondary | ICD-10-CM | POA: Diagnosis not present

## 2014-11-15 DIAGNOSIS — Z5189 Encounter for other specified aftercare: Secondary | ICD-10-CM | POA: Diagnosis not present

## 2014-11-15 DIAGNOSIS — G35 Multiple sclerosis: Secondary | ICD-10-CM | POA: Diagnosis not present

## 2014-11-15 DIAGNOSIS — R531 Weakness: Secondary | ICD-10-CM | POA: Diagnosis not present

## 2014-11-15 DIAGNOSIS — M797 Fibromyalgia: Secondary | ICD-10-CM | POA: Diagnosis not present

## 2014-11-20 DIAGNOSIS — H04123 Dry eye syndrome of bilateral lacrimal glands: Secondary | ICD-10-CM | POA: Diagnosis not present

## 2014-11-20 DIAGNOSIS — H16223 Keratoconjunctivitis sicca, not specified as Sjogren's, bilateral: Secondary | ICD-10-CM | POA: Diagnosis not present

## 2014-11-22 DIAGNOSIS — R279 Unspecified lack of coordination: Secondary | ICD-10-CM | POA: Diagnosis not present

## 2014-11-22 DIAGNOSIS — Z5189 Encounter for other specified aftercare: Secondary | ICD-10-CM | POA: Diagnosis not present

## 2014-11-22 DIAGNOSIS — G35 Multiple sclerosis: Secondary | ICD-10-CM | POA: Diagnosis not present

## 2014-11-22 DIAGNOSIS — M797 Fibromyalgia: Secondary | ICD-10-CM | POA: Diagnosis not present

## 2014-11-22 DIAGNOSIS — R531 Weakness: Secondary | ICD-10-CM | POA: Diagnosis not present

## 2014-11-22 DIAGNOSIS — R269 Unspecified abnormalities of gait and mobility: Secondary | ICD-10-CM | POA: Diagnosis not present

## 2014-12-06 DIAGNOSIS — M797 Fibromyalgia: Secondary | ICD-10-CM | POA: Diagnosis not present

## 2014-12-06 DIAGNOSIS — R269 Unspecified abnormalities of gait and mobility: Secondary | ICD-10-CM | POA: Diagnosis not present

## 2014-12-06 DIAGNOSIS — R531 Weakness: Secondary | ICD-10-CM | POA: Diagnosis not present

## 2014-12-06 DIAGNOSIS — G35 Multiple sclerosis: Secondary | ICD-10-CM | POA: Diagnosis not present

## 2014-12-06 DIAGNOSIS — Z5189 Encounter for other specified aftercare: Secondary | ICD-10-CM | POA: Diagnosis not present

## 2014-12-06 DIAGNOSIS — R279 Unspecified lack of coordination: Secondary | ICD-10-CM | POA: Diagnosis not present

## 2014-12-27 DIAGNOSIS — M545 Low back pain, unspecified: Secondary | ICD-10-CM | POA: Insufficient documentation

## 2014-12-27 DIAGNOSIS — M79604 Pain in right leg: Secondary | ICD-10-CM | POA: Insufficient documentation

## 2015-01-09 DIAGNOSIS — M79645 Pain in left finger(s): Secondary | ICD-10-CM | POA: Diagnosis not present

## 2015-01-09 DIAGNOSIS — G894 Chronic pain syndrome: Secondary | ICD-10-CM | POA: Diagnosis not present

## 2015-01-09 DIAGNOSIS — M545 Low back pain: Secondary | ICD-10-CM | POA: Diagnosis not present

## 2015-01-10 DIAGNOSIS — G894 Chronic pain syndrome: Secondary | ICD-10-CM | POA: Diagnosis not present

## 2015-01-20 DIAGNOSIS — Z87891 Personal history of nicotine dependence: Secondary | ICD-10-CM | POA: Diagnosis not present

## 2015-01-20 DIAGNOSIS — M25571 Pain in right ankle and joints of right foot: Secondary | ICD-10-CM | POA: Diagnosis not present

## 2015-01-20 DIAGNOSIS — M25532 Pain in left wrist: Secondary | ICD-10-CM | POA: Diagnosis not present

## 2015-01-20 DIAGNOSIS — S93401A Sprain of unspecified ligament of right ankle, initial encounter: Secondary | ICD-10-CM | POA: Diagnosis not present

## 2015-01-20 DIAGNOSIS — F419 Anxiety disorder, unspecified: Secondary | ICD-10-CM | POA: Diagnosis not present

## 2015-01-20 DIAGNOSIS — Z043 Encounter for examination and observation following other accident: Secondary | ICD-10-CM | POA: Diagnosis not present

## 2015-01-28 DIAGNOSIS — S93491A Sprain of other ligament of right ankle, initial encounter: Secondary | ICD-10-CM | POA: Diagnosis not present

## 2015-01-28 DIAGNOSIS — M79671 Pain in right foot: Secondary | ICD-10-CM | POA: Diagnosis not present

## 2015-01-28 DIAGNOSIS — S93601A Unspecified sprain of right foot, initial encounter: Secondary | ICD-10-CM | POA: Diagnosis not present

## 2015-01-28 DIAGNOSIS — S93401A Sprain of unspecified ligament of right ankle, initial encounter: Secondary | ICD-10-CM | POA: Diagnosis not present

## 2015-01-30 DIAGNOSIS — F3131 Bipolar disorder, current episode depressed, mild: Secondary | ICD-10-CM | POA: Diagnosis not present

## 2015-02-06 DIAGNOSIS — J4520 Mild intermittent asthma, uncomplicated: Secondary | ICD-10-CM | POA: Diagnosis not present

## 2015-02-06 DIAGNOSIS — J069 Acute upper respiratory infection, unspecified: Secondary | ICD-10-CM | POA: Diagnosis not present

## 2015-02-12 DIAGNOSIS — S93401D Sprain of unspecified ligament of right ankle, subsequent encounter: Secondary | ICD-10-CM | POA: Diagnosis not present

## 2015-02-12 DIAGNOSIS — Z5189 Encounter for other specified aftercare: Secondary | ICD-10-CM | POA: Diagnosis not present

## 2015-02-14 DIAGNOSIS — R1013 Epigastric pain: Secondary | ICD-10-CM | POA: Diagnosis not present

## 2015-02-14 DIAGNOSIS — R112 Nausea with vomiting, unspecified: Secondary | ICD-10-CM | POA: Diagnosis not present

## 2015-02-14 DIAGNOSIS — K5900 Constipation, unspecified: Secondary | ICD-10-CM | POA: Diagnosis not present

## 2015-02-14 DIAGNOSIS — K5190 Ulcerative colitis, unspecified, without complications: Secondary | ICD-10-CM | POA: Diagnosis not present

## 2015-02-18 DIAGNOSIS — M25471 Effusion, right ankle: Secondary | ICD-10-CM | POA: Diagnosis not present

## 2015-02-18 DIAGNOSIS — M7661 Achilles tendinitis, right leg: Secondary | ICD-10-CM | POA: Diagnosis not present

## 2015-02-18 DIAGNOSIS — S93491D Sprain of other ligament of right ankle, subsequent encounter: Secondary | ICD-10-CM | POA: Diagnosis not present

## 2015-02-18 DIAGNOSIS — S93419D Sprain of calcaneofibular ligament of unspecified ankle, subsequent encounter: Secondary | ICD-10-CM | POA: Diagnosis not present

## 2015-02-18 DIAGNOSIS — M65871 Other synovitis and tenosynovitis, right ankle and foot: Secondary | ICD-10-CM | POA: Diagnosis not present

## 2015-02-20 DIAGNOSIS — R262 Difficulty in walking, not elsewhere classified: Secondary | ICD-10-CM | POA: Diagnosis not present

## 2015-02-20 DIAGNOSIS — G35 Multiple sclerosis: Secondary | ICD-10-CM | POA: Diagnosis not present

## 2015-02-20 DIAGNOSIS — M25471 Effusion, right ankle: Secondary | ICD-10-CM | POA: Diagnosis not present

## 2015-02-20 DIAGNOSIS — M25671 Stiffness of right ankle, not elsewhere classified: Secondary | ICD-10-CM | POA: Diagnosis not present

## 2015-02-20 DIAGNOSIS — R29898 Other symptoms and signs involving the musculoskeletal system: Secondary | ICD-10-CM | POA: Diagnosis not present

## 2015-02-20 DIAGNOSIS — S93401D Sprain of unspecified ligament of right ankle, subsequent encounter: Secondary | ICD-10-CM | POA: Diagnosis not present

## 2015-02-20 DIAGNOSIS — M79671 Pain in right foot: Secondary | ICD-10-CM | POA: Diagnosis not present

## 2015-02-20 DIAGNOSIS — M25571 Pain in right ankle and joints of right foot: Secondary | ICD-10-CM | POA: Diagnosis not present

## 2015-02-21 DIAGNOSIS — S93401D Sprain of unspecified ligament of right ankle, subsequent encounter: Secondary | ICD-10-CM | POA: Diagnosis not present

## 2015-02-21 DIAGNOSIS — M25671 Stiffness of right ankle, not elsewhere classified: Secondary | ICD-10-CM | POA: Diagnosis not present

## 2015-02-21 DIAGNOSIS — G35 Multiple sclerosis: Secondary | ICD-10-CM | POA: Diagnosis not present

## 2015-02-21 DIAGNOSIS — M25471 Effusion, right ankle: Secondary | ICD-10-CM | POA: Diagnosis not present

## 2015-02-21 DIAGNOSIS — R262 Difficulty in walking, not elsewhere classified: Secondary | ICD-10-CM | POA: Diagnosis not present

## 2015-02-21 DIAGNOSIS — M25571 Pain in right ankle and joints of right foot: Secondary | ICD-10-CM | POA: Diagnosis not present

## 2015-02-26 DIAGNOSIS — Z87891 Personal history of nicotine dependence: Secondary | ICD-10-CM | POA: Diagnosis not present

## 2015-02-26 DIAGNOSIS — S93601A Unspecified sprain of right foot, initial encounter: Secondary | ICD-10-CM | POA: Diagnosis not present

## 2015-02-26 DIAGNOSIS — J449 Chronic obstructive pulmonary disease, unspecified: Secondary | ICD-10-CM | POA: Diagnosis not present

## 2015-02-26 DIAGNOSIS — G8929 Other chronic pain: Secondary | ICD-10-CM | POA: Diagnosis not present

## 2015-02-26 DIAGNOSIS — S93401A Sprain of unspecified ligament of right ankle, initial encounter: Secondary | ICD-10-CM | POA: Diagnosis not present

## 2015-02-26 DIAGNOSIS — F3131 Bipolar disorder, current episode depressed, mild: Secondary | ICD-10-CM | POA: Diagnosis not present

## 2015-02-26 DIAGNOSIS — M5410 Radiculopathy, site unspecified: Secondary | ICD-10-CM | POA: Diagnosis not present

## 2015-02-28 DIAGNOSIS — G894 Chronic pain syndrome: Secondary | ICD-10-CM | POA: Diagnosis not present

## 2015-02-28 DIAGNOSIS — N951 Menopausal and female climacteric states: Secondary | ICD-10-CM | POA: Diagnosis not present

## 2015-03-04 DIAGNOSIS — M25571 Pain in right ankle and joints of right foot: Secondary | ICD-10-CM | POA: Diagnosis not present

## 2015-03-04 DIAGNOSIS — R262 Difficulty in walking, not elsewhere classified: Secondary | ICD-10-CM | POA: Diagnosis not present

## 2015-03-04 DIAGNOSIS — M25471 Effusion, right ankle: Secondary | ICD-10-CM | POA: Diagnosis not present

## 2015-03-04 DIAGNOSIS — S93401D Sprain of unspecified ligament of right ankle, subsequent encounter: Secondary | ICD-10-CM | POA: Diagnosis not present

## 2015-03-04 DIAGNOSIS — G35 Multiple sclerosis: Secondary | ICD-10-CM | POA: Diagnosis not present

## 2015-03-04 DIAGNOSIS — M25671 Stiffness of right ankle, not elsewhere classified: Secondary | ICD-10-CM | POA: Diagnosis not present

## 2015-03-09 DIAGNOSIS — M25512 Pain in left shoulder: Secondary | ICD-10-CM | POA: Diagnosis not present

## 2015-03-14 DIAGNOSIS — Z5189 Encounter for other specified aftercare: Secondary | ICD-10-CM | POA: Diagnosis not present

## 2015-03-14 DIAGNOSIS — M25571 Pain in right ankle and joints of right foot: Secondary | ICD-10-CM | POA: Diagnosis not present

## 2015-03-14 DIAGNOSIS — M25471 Effusion, right ankle: Secondary | ICD-10-CM | POA: Diagnosis not present

## 2015-03-14 DIAGNOSIS — M25512 Pain in left shoulder: Secondary | ICD-10-CM | POA: Diagnosis not present

## 2015-03-14 DIAGNOSIS — R29898 Other symptoms and signs involving the musculoskeletal system: Secondary | ICD-10-CM | POA: Diagnosis not present

## 2015-03-14 DIAGNOSIS — M79671 Pain in right foot: Secondary | ICD-10-CM | POA: Diagnosis not present

## 2015-03-14 DIAGNOSIS — M25612 Stiffness of left shoulder, not elsewhere classified: Secondary | ICD-10-CM | POA: Diagnosis not present

## 2015-03-14 DIAGNOSIS — R262 Difficulty in walking, not elsewhere classified: Secondary | ICD-10-CM | POA: Diagnosis not present

## 2015-03-14 DIAGNOSIS — M25671 Stiffness of right ankle, not elsewhere classified: Secondary | ICD-10-CM | POA: Diagnosis not present

## 2015-03-14 DIAGNOSIS — S93401D Sprain of unspecified ligament of right ankle, subsequent encounter: Secondary | ICD-10-CM | POA: Diagnosis not present

## 2015-03-19 DIAGNOSIS — H04123 Dry eye syndrome of bilateral lacrimal glands: Secondary | ICD-10-CM | POA: Diagnosis not present

## 2015-03-19 DIAGNOSIS — H16223 Keratoconjunctivitis sicca, not specified as Sjogren's, bilateral: Secondary | ICD-10-CM | POA: Diagnosis not present

## 2015-03-19 DIAGNOSIS — M25512 Pain in left shoulder: Secondary | ICD-10-CM | POA: Diagnosis not present

## 2015-03-22 DIAGNOSIS — M25512 Pain in left shoulder: Secondary | ICD-10-CM | POA: Diagnosis not present

## 2015-03-22 DIAGNOSIS — M25671 Stiffness of right ankle, not elsewhere classified: Secondary | ICD-10-CM | POA: Diagnosis not present

## 2015-03-22 DIAGNOSIS — M25471 Effusion, right ankle: Secondary | ICD-10-CM | POA: Diagnosis not present

## 2015-03-22 DIAGNOSIS — M25571 Pain in right ankle and joints of right foot: Secondary | ICD-10-CM | POA: Diagnosis not present

## 2015-03-22 DIAGNOSIS — R262 Difficulty in walking, not elsewhere classified: Secondary | ICD-10-CM | POA: Diagnosis not present

## 2015-03-22 DIAGNOSIS — S93401D Sprain of unspecified ligament of right ankle, subsequent encounter: Secondary | ICD-10-CM | POA: Diagnosis not present

## 2015-03-29 DIAGNOSIS — M25571 Pain in right ankle and joints of right foot: Secondary | ICD-10-CM | POA: Diagnosis not present

## 2015-03-29 DIAGNOSIS — S93401D Sprain of unspecified ligament of right ankle, subsequent encounter: Secondary | ICD-10-CM | POA: Diagnosis not present

## 2015-03-29 DIAGNOSIS — M25512 Pain in left shoulder: Secondary | ICD-10-CM | POA: Diagnosis not present

## 2015-03-29 DIAGNOSIS — M25471 Effusion, right ankle: Secondary | ICD-10-CM | POA: Diagnosis not present

## 2015-03-29 DIAGNOSIS — R262 Difficulty in walking, not elsewhere classified: Secondary | ICD-10-CM | POA: Diagnosis not present

## 2015-03-29 DIAGNOSIS — M25671 Stiffness of right ankle, not elsewhere classified: Secondary | ICD-10-CM | POA: Diagnosis not present

## 2015-04-01 DIAGNOSIS — M25512 Pain in left shoulder: Secondary | ICD-10-CM | POA: Diagnosis not present

## 2015-04-01 DIAGNOSIS — Z79899 Other long term (current) drug therapy: Secondary | ICD-10-CM | POA: Diagnosis not present

## 2015-04-01 DIAGNOSIS — F319 Bipolar disorder, unspecified: Secondary | ICD-10-CM | POA: Diagnosis not present

## 2015-04-01 DIAGNOSIS — J449 Chronic obstructive pulmonary disease, unspecified: Secondary | ICD-10-CM | POA: Diagnosis not present

## 2015-04-01 DIAGNOSIS — S42115A Nondisplaced fracture of body of scapula, left shoulder, initial encounter for closed fracture: Secondary | ICD-10-CM | POA: Diagnosis not present

## 2015-04-01 DIAGNOSIS — S46812A Strain of other muscles, fascia and tendons at shoulder and upper arm level, left arm, initial encounter: Secondary | ICD-10-CM | POA: Diagnosis not present

## 2015-04-01 DIAGNOSIS — G35 Multiple sclerosis: Secondary | ICD-10-CM | POA: Diagnosis not present

## 2015-04-01 DIAGNOSIS — Z87891 Personal history of nicotine dependence: Secondary | ICD-10-CM | POA: Diagnosis not present

## 2015-04-04 DIAGNOSIS — M25571 Pain in right ankle and joints of right foot: Secondary | ICD-10-CM | POA: Diagnosis not present

## 2015-04-04 DIAGNOSIS — R262 Difficulty in walking, not elsewhere classified: Secondary | ICD-10-CM | POA: Diagnosis not present

## 2015-04-04 DIAGNOSIS — S93401D Sprain of unspecified ligament of right ankle, subsequent encounter: Secondary | ICD-10-CM | POA: Diagnosis not present

## 2015-04-04 DIAGNOSIS — M25471 Effusion, right ankle: Secondary | ICD-10-CM | POA: Diagnosis not present

## 2015-04-04 DIAGNOSIS — M25512 Pain in left shoulder: Secondary | ICD-10-CM | POA: Diagnosis not present

## 2015-04-04 DIAGNOSIS — G35 Multiple sclerosis: Secondary | ICD-10-CM | POA: Diagnosis not present

## 2015-04-04 DIAGNOSIS — M25671 Stiffness of right ankle, not elsewhere classified: Secondary | ICD-10-CM | POA: Diagnosis not present

## 2015-04-10 DIAGNOSIS — F3131 Bipolar disorder, current episode depressed, mild: Secondary | ICD-10-CM | POA: Diagnosis not present

## 2015-04-15 DIAGNOSIS — M25512 Pain in left shoulder: Secondary | ICD-10-CM | POA: Diagnosis not present

## 2015-04-15 DIAGNOSIS — M25471 Effusion, right ankle: Secondary | ICD-10-CM | POA: Diagnosis not present

## 2015-04-15 DIAGNOSIS — R29898 Other symptoms and signs involving the musculoskeletal system: Secondary | ICD-10-CM | POA: Diagnosis not present

## 2015-04-15 DIAGNOSIS — M79671 Pain in right foot: Secondary | ICD-10-CM | POA: Diagnosis not present

## 2015-04-15 DIAGNOSIS — M25571 Pain in right ankle and joints of right foot: Secondary | ICD-10-CM | POA: Diagnosis not present

## 2015-04-15 DIAGNOSIS — M25671 Stiffness of right ankle, not elsewhere classified: Secondary | ICD-10-CM | POA: Diagnosis not present

## 2015-04-15 DIAGNOSIS — R531 Weakness: Secondary | ICD-10-CM | POA: Diagnosis not present

## 2015-04-15 DIAGNOSIS — M25612 Stiffness of left shoulder, not elsewhere classified: Secondary | ICD-10-CM | POA: Diagnosis not present

## 2015-04-15 DIAGNOSIS — Z5189 Encounter for other specified aftercare: Secondary | ICD-10-CM | POA: Diagnosis not present

## 2015-04-15 DIAGNOSIS — S93401D Sprain of unspecified ligament of right ankle, subsequent encounter: Secondary | ICD-10-CM | POA: Diagnosis not present

## 2015-04-17 DIAGNOSIS — M79671 Pain in right foot: Secondary | ICD-10-CM | POA: Diagnosis not present

## 2015-04-17 DIAGNOSIS — M25571 Pain in right ankle and joints of right foot: Secondary | ICD-10-CM | POA: Diagnosis not present

## 2015-04-17 DIAGNOSIS — S93401D Sprain of unspecified ligament of right ankle, subsequent encounter: Secondary | ICD-10-CM | POA: Diagnosis not present

## 2015-04-17 DIAGNOSIS — M25612 Stiffness of left shoulder, not elsewhere classified: Secondary | ICD-10-CM | POA: Diagnosis not present

## 2015-04-17 DIAGNOSIS — R29898 Other symptoms and signs involving the musculoskeletal system: Secondary | ICD-10-CM | POA: Diagnosis not present

## 2015-04-17 DIAGNOSIS — M25512 Pain in left shoulder: Secondary | ICD-10-CM | POA: Diagnosis not present

## 2015-04-23 DIAGNOSIS — S93401D Sprain of unspecified ligament of right ankle, subsequent encounter: Secondary | ICD-10-CM | POA: Diagnosis not present

## 2015-04-23 DIAGNOSIS — M79671 Pain in right foot: Secondary | ICD-10-CM | POA: Diagnosis not present

## 2015-04-23 DIAGNOSIS — M25512 Pain in left shoulder: Secondary | ICD-10-CM | POA: Diagnosis not present

## 2015-04-23 DIAGNOSIS — M25571 Pain in right ankle and joints of right foot: Secondary | ICD-10-CM | POA: Diagnosis not present

## 2015-04-23 DIAGNOSIS — M25612 Stiffness of left shoulder, not elsewhere classified: Secondary | ICD-10-CM | POA: Diagnosis not present

## 2015-04-23 DIAGNOSIS — R29898 Other symptoms and signs involving the musculoskeletal system: Secondary | ICD-10-CM | POA: Diagnosis not present

## 2015-04-25 DIAGNOSIS — M25612 Stiffness of left shoulder, not elsewhere classified: Secondary | ICD-10-CM | POA: Diagnosis not present

## 2015-04-25 DIAGNOSIS — R29898 Other symptoms and signs involving the musculoskeletal system: Secondary | ICD-10-CM | POA: Diagnosis not present

## 2015-04-25 DIAGNOSIS — M25512 Pain in left shoulder: Secondary | ICD-10-CM | POA: Diagnosis not present

## 2015-04-25 DIAGNOSIS — M25571 Pain in right ankle and joints of right foot: Secondary | ICD-10-CM | POA: Diagnosis not present

## 2015-04-25 DIAGNOSIS — J3089 Other allergic rhinitis: Secondary | ICD-10-CM | POA: Diagnosis not present

## 2015-04-25 DIAGNOSIS — S93401D Sprain of unspecified ligament of right ankle, subsequent encounter: Secondary | ICD-10-CM | POA: Diagnosis not present

## 2015-04-25 DIAGNOSIS — B001 Herpesviral vesicular dermatitis: Secondary | ICD-10-CM | POA: Diagnosis not present

## 2015-04-25 DIAGNOSIS — S42102D Fracture of unspecified part of scapula, left shoulder, subsequent encounter for fracture with routine healing: Secondary | ICD-10-CM | POA: Diagnosis not present

## 2015-04-25 DIAGNOSIS — M79671 Pain in right foot: Secondary | ICD-10-CM | POA: Diagnosis not present

## 2015-05-06 DIAGNOSIS — S93401D Sprain of unspecified ligament of right ankle, subsequent encounter: Secondary | ICD-10-CM | POA: Diagnosis not present

## 2015-05-06 DIAGNOSIS — R29898 Other symptoms and signs involving the musculoskeletal system: Secondary | ICD-10-CM | POA: Diagnosis not present

## 2015-05-06 DIAGNOSIS — M25512 Pain in left shoulder: Secondary | ICD-10-CM | POA: Diagnosis not present

## 2015-05-06 DIAGNOSIS — M25571 Pain in right ankle and joints of right foot: Secondary | ICD-10-CM | POA: Diagnosis not present

## 2015-05-06 DIAGNOSIS — M25612 Stiffness of left shoulder, not elsewhere classified: Secondary | ICD-10-CM | POA: Diagnosis not present

## 2015-05-06 DIAGNOSIS — M79671 Pain in right foot: Secondary | ICD-10-CM | POA: Diagnosis not present

## 2015-05-09 DIAGNOSIS — S42102D Fracture of unspecified part of scapula, left shoulder, subsequent encounter for fracture with routine healing: Secondary | ICD-10-CM | POA: Diagnosis not present

## 2015-05-15 DIAGNOSIS — R29898 Other symptoms and signs involving the musculoskeletal system: Secondary | ICD-10-CM | POA: Diagnosis not present

## 2015-05-15 DIAGNOSIS — M25512 Pain in left shoulder: Secondary | ICD-10-CM | POA: Diagnosis not present

## 2015-05-15 DIAGNOSIS — M25471 Effusion, right ankle: Secondary | ICD-10-CM | POA: Diagnosis not present

## 2015-05-15 DIAGNOSIS — S93401D Sprain of unspecified ligament of right ankle, subsequent encounter: Secondary | ICD-10-CM | POA: Diagnosis not present

## 2015-05-15 DIAGNOSIS — M25671 Stiffness of right ankle, not elsewhere classified: Secondary | ICD-10-CM | POA: Diagnosis not present

## 2015-05-15 DIAGNOSIS — M25571 Pain in right ankle and joints of right foot: Secondary | ICD-10-CM | POA: Diagnosis not present

## 2015-05-15 DIAGNOSIS — M25612 Stiffness of left shoulder, not elsewhere classified: Secondary | ICD-10-CM | POA: Diagnosis not present

## 2015-05-15 DIAGNOSIS — M79671 Pain in right foot: Secondary | ICD-10-CM | POA: Diagnosis not present

## 2015-06-05 DIAGNOSIS — S93401D Sprain of unspecified ligament of right ankle, subsequent encounter: Secondary | ICD-10-CM | POA: Diagnosis not present

## 2015-06-05 DIAGNOSIS — M25512 Pain in left shoulder: Secondary | ICD-10-CM | POA: Diagnosis not present

## 2015-06-05 DIAGNOSIS — M25612 Stiffness of left shoulder, not elsewhere classified: Secondary | ICD-10-CM | POA: Diagnosis not present

## 2015-06-05 DIAGNOSIS — R29898 Other symptoms and signs involving the musculoskeletal system: Secondary | ICD-10-CM | POA: Diagnosis not present

## 2015-06-05 DIAGNOSIS — M79671 Pain in right foot: Secondary | ICD-10-CM | POA: Diagnosis not present

## 2015-06-05 DIAGNOSIS — M25571 Pain in right ankle and joints of right foot: Secondary | ICD-10-CM | POA: Diagnosis not present

## 2015-06-10 DIAGNOSIS — F3181 Bipolar II disorder: Secondary | ICD-10-CM | POA: Diagnosis not present

## 2015-06-12 DIAGNOSIS — N898 Other specified noninflammatory disorders of vagina: Secondary | ICD-10-CM | POA: Diagnosis not present

## 2015-06-12 DIAGNOSIS — M25612 Stiffness of left shoulder, not elsewhere classified: Secondary | ICD-10-CM | POA: Diagnosis not present

## 2015-06-12 DIAGNOSIS — M25471 Effusion, right ankle: Secondary | ICD-10-CM | POA: Diagnosis not present

## 2015-06-12 DIAGNOSIS — M25671 Stiffness of right ankle, not elsewhere classified: Secondary | ICD-10-CM | POA: Diagnosis not present

## 2015-06-12 DIAGNOSIS — M6281 Muscle weakness (generalized): Secondary | ICD-10-CM | POA: Diagnosis not present

## 2015-06-12 DIAGNOSIS — M25512 Pain in left shoulder: Secondary | ICD-10-CM | POA: Diagnosis not present

## 2015-06-12 DIAGNOSIS — M25571 Pain in right ankle and joints of right foot: Secondary | ICD-10-CM | POA: Diagnosis not present

## 2015-06-12 DIAGNOSIS — S93401D Sprain of unspecified ligament of right ankle, subsequent encounter: Secondary | ICD-10-CM | POA: Diagnosis not present

## 2015-06-12 DIAGNOSIS — M79671 Pain in right foot: Secondary | ICD-10-CM | POA: Diagnosis not present

## 2015-06-18 DIAGNOSIS — T63464A Toxic effect of venom of wasps, undetermined, initial encounter: Secondary | ICD-10-CM | POA: Diagnosis not present

## 2015-06-19 DIAGNOSIS — M79671 Pain in right foot: Secondary | ICD-10-CM | POA: Diagnosis not present

## 2015-06-19 DIAGNOSIS — M25512 Pain in left shoulder: Secondary | ICD-10-CM | POA: Diagnosis not present

## 2015-06-19 DIAGNOSIS — M25571 Pain in right ankle and joints of right foot: Secondary | ICD-10-CM | POA: Diagnosis not present

## 2015-06-19 DIAGNOSIS — S93401D Sprain of unspecified ligament of right ankle, subsequent encounter: Secondary | ICD-10-CM | POA: Diagnosis not present

## 2015-06-19 DIAGNOSIS — M25612 Stiffness of left shoulder, not elsewhere classified: Secondary | ICD-10-CM | POA: Diagnosis not present

## 2015-06-19 DIAGNOSIS — M6281 Muscle weakness (generalized): Secondary | ICD-10-CM | POA: Diagnosis not present

## 2015-06-20 DIAGNOSIS — S46812S Strain of other muscles, fascia and tendons at shoulder and upper arm level, left arm, sequela: Secondary | ICD-10-CM | POA: Diagnosis not present

## 2015-06-20 DIAGNOSIS — M67912 Unspecified disorder of synovium and tendon, left shoulder: Secondary | ICD-10-CM | POA: Diagnosis not present

## 2015-06-20 DIAGNOSIS — S42112D Displaced fracture of body of scapula, left shoulder, subsequent encounter for fracture with routine healing: Secondary | ICD-10-CM | POA: Diagnosis not present

## 2015-06-20 DIAGNOSIS — M25512 Pain in left shoulder: Secondary | ICD-10-CM | POA: Diagnosis not present

## 2015-07-02 DIAGNOSIS — M797 Fibromyalgia: Secondary | ICD-10-CM | POA: Diagnosis not present

## 2015-07-02 DIAGNOSIS — Z720 Tobacco use: Secondary | ICD-10-CM | POA: Diagnosis not present

## 2015-07-02 DIAGNOSIS — F17210 Nicotine dependence, cigarettes, uncomplicated: Secondary | ICD-10-CM | POA: Diagnosis not present

## 2015-07-02 DIAGNOSIS — H539 Unspecified visual disturbance: Secondary | ICD-10-CM | POA: Diagnosis not present

## 2015-07-02 DIAGNOSIS — H9190 Unspecified hearing loss, unspecified ear: Secondary | ICD-10-CM | POA: Diagnosis not present

## 2015-07-02 DIAGNOSIS — Z87891 Personal history of nicotine dependence: Secondary | ICD-10-CM | POA: Diagnosis not present

## 2015-07-02 DIAGNOSIS — G35 Multiple sclerosis: Secondary | ICD-10-CM | POA: Diagnosis not present

## 2015-07-09 DIAGNOSIS — M79671 Pain in right foot: Secondary | ICD-10-CM | POA: Diagnosis not present

## 2015-07-09 DIAGNOSIS — M25571 Pain in right ankle and joints of right foot: Secondary | ICD-10-CM | POA: Diagnosis not present

## 2015-07-09 DIAGNOSIS — S93401D Sprain of unspecified ligament of right ankle, subsequent encounter: Secondary | ICD-10-CM | POA: Diagnosis not present

## 2015-07-09 DIAGNOSIS — M25612 Stiffness of left shoulder, not elsewhere classified: Secondary | ICD-10-CM | POA: Diagnosis not present

## 2015-07-09 DIAGNOSIS — M25512 Pain in left shoulder: Secondary | ICD-10-CM | POA: Diagnosis not present

## 2015-07-09 DIAGNOSIS — M6281 Muscle weakness (generalized): Secondary | ICD-10-CM | POA: Diagnosis not present

## 2015-07-23 DIAGNOSIS — M25471 Effusion, right ankle: Secondary | ICD-10-CM | POA: Diagnosis not present

## 2015-07-23 DIAGNOSIS — M25512 Pain in left shoulder: Secondary | ICD-10-CM | POA: Diagnosis not present

## 2015-07-23 DIAGNOSIS — S93401D Sprain of unspecified ligament of right ankle, subsequent encounter: Secondary | ICD-10-CM | POA: Diagnosis not present

## 2015-07-23 DIAGNOSIS — M25612 Stiffness of left shoulder, not elsewhere classified: Secondary | ICD-10-CM | POA: Diagnosis not present

## 2015-07-23 DIAGNOSIS — M25571 Pain in right ankle and joints of right foot: Secondary | ICD-10-CM | POA: Diagnosis not present

## 2015-07-23 DIAGNOSIS — M79671 Pain in right foot: Secondary | ICD-10-CM | POA: Diagnosis not present

## 2015-07-23 DIAGNOSIS — M6281 Muscle weakness (generalized): Secondary | ICD-10-CM | POA: Diagnosis not present

## 2015-07-25 DIAGNOSIS — M75102 Unspecified rotator cuff tear or rupture of left shoulder, not specified as traumatic: Secondary | ICD-10-CM | POA: Diagnosis not present

## 2015-07-25 DIAGNOSIS — S42115D Nondisplaced fracture of body of scapula, left shoulder, subsequent encounter for fracture with routine healing: Secondary | ICD-10-CM | POA: Diagnosis not present

## 2015-07-25 DIAGNOSIS — M797 Fibromyalgia: Secondary | ICD-10-CM | POA: Diagnosis not present

## 2015-08-07 DIAGNOSIS — H04123 Dry eye syndrome of bilateral lacrimal glands: Secondary | ICD-10-CM | POA: Diagnosis not present

## 2015-08-13 DIAGNOSIS — G35 Multiple sclerosis: Secondary | ICD-10-CM | POA: Diagnosis not present

## 2015-08-19 DIAGNOSIS — G8929 Other chronic pain: Secondary | ICD-10-CM | POA: Diagnosis not present

## 2015-08-19 DIAGNOSIS — M6281 Muscle weakness (generalized): Secondary | ICD-10-CM | POA: Diagnosis not present

## 2015-08-19 DIAGNOSIS — M25512 Pain in left shoulder: Secondary | ICD-10-CM | POA: Diagnosis not present

## 2015-08-19 DIAGNOSIS — M25612 Stiffness of left shoulder, not elsewhere classified: Secondary | ICD-10-CM | POA: Diagnosis not present

## 2015-08-23 DIAGNOSIS — M25512 Pain in left shoulder: Secondary | ICD-10-CM | POA: Diagnosis not present

## 2015-08-23 DIAGNOSIS — G8929 Other chronic pain: Secondary | ICD-10-CM | POA: Diagnosis not present

## 2015-08-23 DIAGNOSIS — M6281 Muscle weakness (generalized): Secondary | ICD-10-CM | POA: Diagnosis not present

## 2015-08-23 DIAGNOSIS — M25612 Stiffness of left shoulder, not elsewhere classified: Secondary | ICD-10-CM | POA: Diagnosis not present

## 2015-08-27 DIAGNOSIS — G35 Multiple sclerosis: Secondary | ICD-10-CM | POA: Diagnosis not present

## 2015-08-27 DIAGNOSIS — J320 Chronic maxillary sinusitis: Secondary | ICD-10-CM | POA: Diagnosis not present

## 2015-08-29 DIAGNOSIS — Z23 Encounter for immunization: Secondary | ICD-10-CM | POA: Diagnosis not present

## 2015-09-05 DIAGNOSIS — G8929 Other chronic pain: Secondary | ICD-10-CM | POA: Diagnosis not present

## 2015-09-05 DIAGNOSIS — M25612 Stiffness of left shoulder, not elsewhere classified: Secondary | ICD-10-CM | POA: Diagnosis not present

## 2015-09-05 DIAGNOSIS — M6281 Muscle weakness (generalized): Secondary | ICD-10-CM | POA: Diagnosis not present

## 2015-09-05 DIAGNOSIS — M25512 Pain in left shoulder: Secondary | ICD-10-CM | POA: Diagnosis not present

## 2015-09-10 DIAGNOSIS — M25512 Pain in left shoulder: Secondary | ICD-10-CM | POA: Diagnosis not present

## 2015-09-10 DIAGNOSIS — M6281 Muscle weakness (generalized): Secondary | ICD-10-CM | POA: Diagnosis not present

## 2015-09-10 DIAGNOSIS — G8929 Other chronic pain: Secondary | ICD-10-CM | POA: Diagnosis not present

## 2015-09-27 DIAGNOSIS — J449 Chronic obstructive pulmonary disease, unspecified: Secondary | ICD-10-CM | POA: Diagnosis not present

## 2015-09-27 DIAGNOSIS — Z888 Allergy status to other drugs, medicaments and biological substances status: Secondary | ICD-10-CM | POA: Diagnosis not present

## 2015-09-27 DIAGNOSIS — K642 Third degree hemorrhoids: Secondary | ICD-10-CM | POA: Insufficient documentation

## 2015-09-27 DIAGNOSIS — M62830 Muscle spasm of back: Secondary | ICD-10-CM | POA: Diagnosis not present

## 2015-09-27 DIAGNOSIS — Z7951 Long term (current) use of inhaled steroids: Secondary | ICD-10-CM | POA: Diagnosis not present

## 2015-09-27 DIAGNOSIS — Z86711 Personal history of pulmonary embolism: Secondary | ICD-10-CM | POA: Diagnosis not present

## 2015-09-27 DIAGNOSIS — R197 Diarrhea, unspecified: Secondary | ICD-10-CM | POA: Diagnosis not present

## 2015-09-27 DIAGNOSIS — Z79899 Other long term (current) drug therapy: Secondary | ICD-10-CM | POA: Diagnosis not present

## 2015-09-27 DIAGNOSIS — Z87891 Personal history of nicotine dependence: Secondary | ICD-10-CM | POA: Diagnosis not present

## 2015-09-27 DIAGNOSIS — K6389 Other specified diseases of intestine: Secondary | ICD-10-CM | POA: Diagnosis not present

## 2015-09-27 DIAGNOSIS — K5180 Other ulcerative colitis without complications: Secondary | ICD-10-CM | POA: Diagnosis not present

## 2015-09-27 DIAGNOSIS — K5190 Ulcerative colitis, unspecified, without complications: Secondary | ICD-10-CM | POA: Diagnosis not present

## 2015-09-27 DIAGNOSIS — J019 Acute sinusitis, unspecified: Secondary | ICD-10-CM | POA: Diagnosis not present

## 2015-09-30 DIAGNOSIS — G8929 Other chronic pain: Secondary | ICD-10-CM | POA: Diagnosis not present

## 2015-09-30 DIAGNOSIS — M5440 Lumbago with sciatica, unspecified side: Secondary | ICD-10-CM | POA: Diagnosis not present

## 2015-09-30 DIAGNOSIS — G894 Chronic pain syndrome: Secondary | ICD-10-CM | POA: Diagnosis not present

## 2015-09-30 DIAGNOSIS — M5137 Other intervertebral disc degeneration, lumbosacral region: Secondary | ICD-10-CM | POA: Diagnosis not present

## 2015-09-30 DIAGNOSIS — M47897 Other spondylosis, lumbosacral region: Secondary | ICD-10-CM | POA: Diagnosis not present

## 2015-10-01 DIAGNOSIS — G35 Multiple sclerosis: Secondary | ICD-10-CM | POA: Diagnosis not present

## 2015-10-01 DIAGNOSIS — E611 Iron deficiency: Secondary | ICD-10-CM | POA: Insufficient documentation

## 2015-10-14 DIAGNOSIS — M25512 Pain in left shoulder: Secondary | ICD-10-CM | POA: Diagnosis not present

## 2015-10-14 DIAGNOSIS — G8929 Other chronic pain: Secondary | ICD-10-CM | POA: Diagnosis not present

## 2015-10-14 DIAGNOSIS — M6281 Muscle weakness (generalized): Secondary | ICD-10-CM | POA: Diagnosis not present

## 2015-10-14 DIAGNOSIS — M25612 Stiffness of left shoulder, not elsewhere classified: Secondary | ICD-10-CM | POA: Diagnosis not present

## 2015-10-18 DIAGNOSIS — M6281 Muscle weakness (generalized): Secondary | ICD-10-CM | POA: Diagnosis not present

## 2015-10-18 DIAGNOSIS — M25512 Pain in left shoulder: Secondary | ICD-10-CM | POA: Diagnosis not present

## 2015-10-18 DIAGNOSIS — M25612 Stiffness of left shoulder, not elsewhere classified: Secondary | ICD-10-CM | POA: Diagnosis not present

## 2015-10-18 DIAGNOSIS — G8929 Other chronic pain: Secondary | ICD-10-CM | POA: Diagnosis not present

## 2015-10-25 DIAGNOSIS — M25512 Pain in left shoulder: Secondary | ICD-10-CM | POA: Diagnosis not present

## 2015-10-25 DIAGNOSIS — G8929 Other chronic pain: Secondary | ICD-10-CM | POA: Diagnosis not present

## 2015-10-28 DIAGNOSIS — R197 Diarrhea, unspecified: Secondary | ICD-10-CM | POA: Diagnosis not present

## 2015-10-28 DIAGNOSIS — M1810 Unilateral primary osteoarthritis of first carpometacarpal joint, unspecified hand: Secondary | ICD-10-CM | POA: Diagnosis not present

## 2015-10-28 DIAGNOSIS — K648 Other hemorrhoids: Secondary | ICD-10-CM | POA: Diagnosis not present

## 2015-10-28 DIAGNOSIS — Z1211 Encounter for screening for malignant neoplasm of colon: Secondary | ICD-10-CM | POA: Diagnosis not present

## 2015-10-28 DIAGNOSIS — J449 Chronic obstructive pulmonary disease, unspecified: Secondary | ICD-10-CM | POA: Diagnosis not present

## 2015-10-28 DIAGNOSIS — D128 Benign neoplasm of rectum: Secondary | ICD-10-CM | POA: Diagnosis not present

## 2015-10-28 DIAGNOSIS — Z792 Long term (current) use of antibiotics: Secondary | ICD-10-CM | POA: Diagnosis not present

## 2015-10-28 DIAGNOSIS — Z884 Allergy status to anesthetic agent status: Secondary | ICD-10-CM | POA: Diagnosis not present

## 2015-10-28 DIAGNOSIS — D123 Benign neoplasm of transverse colon: Secondary | ICD-10-CM | POA: Diagnosis not present

## 2015-10-28 DIAGNOSIS — K5190 Ulcerative colitis, unspecified, without complications: Secondary | ICD-10-CM | POA: Diagnosis not present

## 2015-10-28 DIAGNOSIS — F4321 Adjustment disorder with depressed mood: Secondary | ICD-10-CM | POA: Diagnosis not present

## 2015-10-28 DIAGNOSIS — Z79899 Other long term (current) drug therapy: Secondary | ICD-10-CM | POA: Diagnosis not present

## 2015-10-28 DIAGNOSIS — D509 Iron deficiency anemia, unspecified: Secondary | ICD-10-CM | POA: Diagnosis not present

## 2015-10-28 DIAGNOSIS — D124 Benign neoplasm of descending colon: Secondary | ICD-10-CM | POA: Diagnosis not present

## 2015-10-28 DIAGNOSIS — F17210 Nicotine dependence, cigarettes, uncomplicated: Secondary | ICD-10-CM | POA: Diagnosis not present

## 2015-10-28 DIAGNOSIS — K642 Third degree hemorrhoids: Secondary | ICD-10-CM | POA: Diagnosis not present

## 2015-10-28 DIAGNOSIS — M797 Fibromyalgia: Secondary | ICD-10-CM | POA: Diagnosis not present

## 2015-10-28 DIAGNOSIS — G4730 Sleep apnea, unspecified: Secondary | ICD-10-CM | POA: Diagnosis not present

## 2015-10-28 DIAGNOSIS — Z7951 Long term (current) use of inhaled steroids: Secondary | ICD-10-CM | POA: Diagnosis not present

## 2015-10-28 DIAGNOSIS — Z91018 Allergy to other foods: Secondary | ICD-10-CM | POA: Diagnosis not present

## 2015-10-28 DIAGNOSIS — L538 Other specified erythematous conditions: Secondary | ICD-10-CM | POA: Diagnosis not present

## 2015-10-28 DIAGNOSIS — K621 Rectal polyp: Secondary | ICD-10-CM | POA: Diagnosis not present

## 2015-10-28 DIAGNOSIS — G894 Chronic pain syndrome: Secondary | ICD-10-CM | POA: Diagnosis not present

## 2015-10-28 DIAGNOSIS — F3189 Other bipolar disorder: Secondary | ICD-10-CM | POA: Diagnosis not present

## 2015-10-28 DIAGNOSIS — G35 Multiple sclerosis: Secondary | ICD-10-CM | POA: Diagnosis not present

## 2015-10-28 DIAGNOSIS — D120 Benign neoplasm of cecum: Secondary | ICD-10-CM | POA: Diagnosis not present

## 2015-10-28 DIAGNOSIS — D127 Benign neoplasm of rectosigmoid junction: Secondary | ICD-10-CM | POA: Diagnosis not present

## 2015-11-21 DIAGNOSIS — F319 Bipolar disorder, unspecified: Secondary | ICD-10-CM | POA: Diagnosis not present

## 2015-11-21 DIAGNOSIS — B9689 Other specified bacterial agents as the cause of diseases classified elsewhere: Secondary | ICD-10-CM | POA: Diagnosis not present

## 2015-11-21 DIAGNOSIS — J019 Acute sinusitis, unspecified: Secondary | ICD-10-CM | POA: Diagnosis not present

## 2015-11-21 DIAGNOSIS — K51919 Ulcerative colitis, unspecified with unspecified complications: Secondary | ICD-10-CM | POA: Diagnosis not present

## 2015-11-21 DIAGNOSIS — F17200 Nicotine dependence, unspecified, uncomplicated: Secondary | ICD-10-CM | POA: Diagnosis not present

## 2015-11-21 DIAGNOSIS — G35 Multiple sclerosis: Secondary | ICD-10-CM | POA: Diagnosis not present

## 2015-11-26 DIAGNOSIS — H04123 Dry eye syndrome of bilateral lacrimal glands: Secondary | ICD-10-CM | POA: Diagnosis not present

## 2015-11-28 DIAGNOSIS — G35 Multiple sclerosis: Secondary | ICD-10-CM | POA: Diagnosis not present

## 2015-11-28 DIAGNOSIS — G8929 Other chronic pain: Secondary | ICD-10-CM | POA: Diagnosis not present

## 2015-11-28 DIAGNOSIS — Z7951 Long term (current) use of inhaled steroids: Secondary | ICD-10-CM | POA: Diagnosis not present

## 2015-11-28 DIAGNOSIS — Z888 Allergy status to other drugs, medicaments and biological substances status: Secondary | ICD-10-CM | POA: Diagnosis not present

## 2015-11-28 DIAGNOSIS — J449 Chronic obstructive pulmonary disease, unspecified: Secondary | ICD-10-CM | POA: Diagnosis not present

## 2015-11-28 DIAGNOSIS — Z79899 Other long term (current) drug therapy: Secondary | ICD-10-CM | POA: Diagnosis not present

## 2015-11-28 DIAGNOSIS — J019 Acute sinusitis, unspecified: Secondary | ICD-10-CM | POA: Diagnosis not present

## 2015-11-28 DIAGNOSIS — M7582 Other shoulder lesions, left shoulder: Secondary | ICD-10-CM | POA: Diagnosis not present

## 2015-11-28 DIAGNOSIS — F17210 Nicotine dependence, cigarettes, uncomplicated: Secondary | ICD-10-CM | POA: Diagnosis not present

## 2015-11-28 DIAGNOSIS — B9689 Other specified bacterial agents as the cause of diseases classified elsewhere: Secondary | ICD-10-CM | POA: Diagnosis not present

## 2015-11-28 DIAGNOSIS — M25512 Pain in left shoulder: Secondary | ICD-10-CM | POA: Diagnosis not present

## 2015-12-10 DIAGNOSIS — G4709 Other insomnia: Secondary | ICD-10-CM | POA: Diagnosis not present

## 2015-12-10 DIAGNOSIS — F3181 Bipolar II disorder: Secondary | ICD-10-CM | POA: Diagnosis not present

## 2015-12-10 DIAGNOSIS — F3132 Bipolar disorder, current episode depressed, moderate: Secondary | ICD-10-CM | POA: Diagnosis not present

## 2015-12-10 DIAGNOSIS — F331 Major depressive disorder, recurrent, moderate: Secondary | ICD-10-CM | POA: Diagnosis not present

## 2015-12-17 DIAGNOSIS — G8929 Other chronic pain: Secondary | ICD-10-CM | POA: Diagnosis not present

## 2015-12-17 DIAGNOSIS — G4733 Obstructive sleep apnea (adult) (pediatric): Secondary | ICD-10-CM | POA: Diagnosis not present

## 2015-12-17 DIAGNOSIS — Z23 Encounter for immunization: Secondary | ICD-10-CM | POA: Diagnosis not present

## 2015-12-17 DIAGNOSIS — G35 Multiple sclerosis: Secondary | ICD-10-CM | POA: Diagnosis not present

## 2015-12-17 DIAGNOSIS — M25512 Pain in left shoulder: Secondary | ICD-10-CM | POA: Diagnosis not present

## 2015-12-17 DIAGNOSIS — M797 Fibromyalgia: Secondary | ICD-10-CM | POA: Diagnosis not present

## 2015-12-17 DIAGNOSIS — Z0000 Encounter for general adult medical examination without abnormal findings: Secondary | ICD-10-CM | POA: Diagnosis not present

## 2015-12-17 DIAGNOSIS — J349 Unspecified disorder of nose and nasal sinuses: Secondary | ICD-10-CM | POA: Diagnosis not present

## 2015-12-20 DIAGNOSIS — Z136 Encounter for screening for cardiovascular disorders: Secondary | ICD-10-CM | POA: Diagnosis not present

## 2015-12-20 DIAGNOSIS — R238 Other skin changes: Secondary | ICD-10-CM | POA: Diagnosis not present

## 2015-12-20 DIAGNOSIS — Z131 Encounter for screening for diabetes mellitus: Secondary | ICD-10-CM | POA: Diagnosis not present

## 2015-12-24 DIAGNOSIS — M25512 Pain in left shoulder: Secondary | ICD-10-CM | POA: Diagnosis not present

## 2015-12-25 DIAGNOSIS — Z6826 Body mass index (BMI) 26.0-26.9, adult: Secondary | ICD-10-CM | POA: Diagnosis not present

## 2015-12-25 DIAGNOSIS — J449 Chronic obstructive pulmonary disease, unspecified: Secondary | ICD-10-CM | POA: Diagnosis not present

## 2015-12-25 DIAGNOSIS — M797 Fibromyalgia: Secondary | ICD-10-CM | POA: Diagnosis not present

## 2015-12-25 DIAGNOSIS — G35 Multiple sclerosis: Secondary | ICD-10-CM | POA: Diagnosis not present

## 2015-12-25 DIAGNOSIS — Z87891 Personal history of nicotine dependence: Secondary | ICD-10-CM | POA: Diagnosis not present

## 2015-12-25 DIAGNOSIS — E611 Iron deficiency: Secondary | ICD-10-CM | POA: Diagnosis not present

## 2015-12-25 DIAGNOSIS — R51 Headache: Secondary | ICD-10-CM | POA: Diagnosis not present

## 2015-12-25 DIAGNOSIS — R5383 Other fatigue: Secondary | ICD-10-CM | POA: Diagnosis not present

## 2015-12-25 DIAGNOSIS — J329 Chronic sinusitis, unspecified: Secondary | ICD-10-CM | POA: Diagnosis not present

## 2015-12-27 DIAGNOSIS — K642 Third degree hemorrhoids: Secondary | ICD-10-CM | POA: Diagnosis not present

## 2015-12-27 DIAGNOSIS — Z888 Allergy status to other drugs, medicaments and biological substances status: Secondary | ICD-10-CM | POA: Diagnosis not present

## 2015-12-27 DIAGNOSIS — Z87891 Personal history of nicotine dependence: Secondary | ICD-10-CM | POA: Diagnosis not present

## 2015-12-27 DIAGNOSIS — K5190 Ulcerative colitis, unspecified, without complications: Secondary | ICD-10-CM | POA: Diagnosis not present

## 2015-12-27 DIAGNOSIS — Z9889 Other specified postprocedural states: Secondary | ICD-10-CM | POA: Diagnosis not present

## 2015-12-27 DIAGNOSIS — K6389 Other specified diseases of intestine: Secondary | ICD-10-CM | POA: Diagnosis not present

## 2015-12-27 DIAGNOSIS — R197 Diarrhea, unspecified: Secondary | ICD-10-CM | POA: Diagnosis not present

## 2015-12-27 DIAGNOSIS — Z79899 Other long term (current) drug therapy: Secondary | ICD-10-CM | POA: Diagnosis not present

## 2016-01-14 DIAGNOSIS — J320 Chronic maxillary sinusitis: Secondary | ICD-10-CM | POA: Diagnosis not present

## 2016-01-14 DIAGNOSIS — F17210 Nicotine dependence, cigarettes, uncomplicated: Secondary | ICD-10-CM | POA: Diagnosis not present

## 2016-01-14 DIAGNOSIS — Z6826 Body mass index (BMI) 26.0-26.9, adult: Secondary | ICD-10-CM | POA: Diagnosis not present

## 2016-01-14 DIAGNOSIS — J342 Deviated nasal septum: Secondary | ICD-10-CM | POA: Insufficient documentation

## 2016-01-14 DIAGNOSIS — J329 Chronic sinusitis, unspecified: Secondary | ICD-10-CM | POA: Diagnosis not present

## 2016-01-14 DIAGNOSIS — G35 Multiple sclerosis: Secondary | ICD-10-CM | POA: Diagnosis not present

## 2016-01-14 DIAGNOSIS — J343 Hypertrophy of nasal turbinates: Secondary | ICD-10-CM | POA: Insufficient documentation

## 2016-01-16 DIAGNOSIS — Z888 Allergy status to other drugs, medicaments and biological substances status: Secondary | ICD-10-CM | POA: Diagnosis not present

## 2016-01-16 DIAGNOSIS — G8918 Other acute postprocedural pain: Secondary | ICD-10-CM | POA: Diagnosis not present

## 2016-01-16 DIAGNOSIS — M1990 Unspecified osteoarthritis, unspecified site: Secondary | ICD-10-CM | POA: Diagnosis not present

## 2016-01-16 DIAGNOSIS — F17200 Nicotine dependence, unspecified, uncomplicated: Secondary | ICD-10-CM | POA: Diagnosis not present

## 2016-01-16 DIAGNOSIS — T8484XA Pain due to internal orthopedic prosthetic devices, implants and grafts, initial encounter: Secondary | ICD-10-CM | POA: Diagnosis not present

## 2016-01-16 DIAGNOSIS — F329 Major depressive disorder, single episode, unspecified: Secondary | ICD-10-CM | POA: Diagnosis not present

## 2016-01-16 DIAGNOSIS — G4730 Sleep apnea, unspecified: Secondary | ICD-10-CM | POA: Diagnosis not present

## 2016-01-16 DIAGNOSIS — Z472 Encounter for removal of internal fixation device: Secondary | ICD-10-CM | POA: Diagnosis not present

## 2016-01-16 DIAGNOSIS — M25512 Pain in left shoulder: Secondary | ICD-10-CM | POA: Diagnosis not present

## 2016-01-16 DIAGNOSIS — Z79899 Other long term (current) drug therapy: Secondary | ICD-10-CM | POA: Diagnosis not present

## 2016-01-29 DIAGNOSIS — R6889 Other general symptoms and signs: Secondary | ICD-10-CM | POA: Diagnosis not present

## 2016-01-29 DIAGNOSIS — J069 Acute upper respiratory infection, unspecified: Secondary | ICD-10-CM | POA: Diagnosis not present

## 2016-01-29 DIAGNOSIS — B9789 Other viral agents as the cause of diseases classified elsewhere: Secondary | ICD-10-CM | POA: Diagnosis not present

## 2016-01-30 DIAGNOSIS — Z969 Presence of functional implant, unspecified: Secondary | ICD-10-CM | POA: Diagnosis not present

## 2016-01-30 DIAGNOSIS — M79601 Pain in right arm: Secondary | ICD-10-CM | POA: Diagnosis not present

## 2016-02-04 DIAGNOSIS — L601 Onycholysis: Secondary | ICD-10-CM | POA: Diagnosis not present

## 2016-02-11 DIAGNOSIS — J3489 Other specified disorders of nose and nasal sinuses: Secondary | ICD-10-CM | POA: Insufficient documentation

## 2016-02-11 DIAGNOSIS — J328 Other chronic sinusitis: Secondary | ICD-10-CM | POA: Diagnosis not present

## 2016-02-11 DIAGNOSIS — F17210 Nicotine dependence, cigarettes, uncomplicated: Secondary | ICD-10-CM | POA: Diagnosis not present

## 2016-02-11 DIAGNOSIS — G35 Multiple sclerosis: Secondary | ICD-10-CM | POA: Diagnosis not present

## 2016-02-11 DIAGNOSIS — J343 Hypertrophy of nasal turbinates: Secondary | ICD-10-CM | POA: Diagnosis not present

## 2016-02-11 DIAGNOSIS — J320 Chronic maxillary sinusitis: Secondary | ICD-10-CM | POA: Diagnosis not present

## 2016-02-11 DIAGNOSIS — J324 Chronic pansinusitis: Secondary | ICD-10-CM | POA: Diagnosis not present

## 2016-02-11 DIAGNOSIS — Z6826 Body mass index (BMI) 26.0-26.9, adult: Secondary | ICD-10-CM | POA: Diagnosis not present

## 2016-02-11 DIAGNOSIS — J323 Chronic sphenoidal sinusitis: Secondary | ICD-10-CM | POA: Diagnosis not present

## 2016-02-11 DIAGNOSIS — J342 Deviated nasal septum: Secondary | ICD-10-CM | POA: Diagnosis not present

## 2016-02-18 DIAGNOSIS — M19041 Primary osteoarthritis, right hand: Secondary | ICD-10-CM | POA: Diagnosis not present

## 2016-02-18 DIAGNOSIS — Z7951 Long term (current) use of inhaled steroids: Secondary | ICD-10-CM | POA: Diagnosis not present

## 2016-02-18 DIAGNOSIS — Z87828 Personal history of other (healed) physical injury and trauma: Secondary | ICD-10-CM | POA: Diagnosis not present

## 2016-02-18 DIAGNOSIS — Z79899 Other long term (current) drug therapy: Secondary | ICD-10-CM | POA: Diagnosis not present

## 2016-02-18 DIAGNOSIS — J449 Chronic obstructive pulmonary disease, unspecified: Secondary | ICD-10-CM | POA: Diagnosis not present

## 2016-02-18 DIAGNOSIS — M129 Arthropathy, unspecified: Secondary | ICD-10-CM | POA: Diagnosis not present

## 2016-02-18 DIAGNOSIS — Z884 Allergy status to anesthetic agent status: Secondary | ICD-10-CM | POA: Diagnosis not present

## 2016-02-18 DIAGNOSIS — F17210 Nicotine dependence, cigarettes, uncomplicated: Secondary | ICD-10-CM | POA: Diagnosis not present

## 2016-02-18 DIAGNOSIS — M25541 Pain in joints of right hand: Secondary | ICD-10-CM | POA: Diagnosis not present

## 2016-02-18 DIAGNOSIS — Z981 Arthrodesis status: Secondary | ICD-10-CM | POA: Diagnosis not present

## 2016-02-25 DIAGNOSIS — F319 Bipolar disorder, unspecified: Secondary | ICD-10-CM | POA: Diagnosis not present

## 2016-02-25 DIAGNOSIS — Z720 Tobacco use: Secondary | ICD-10-CM | POA: Diagnosis not present

## 2016-02-25 DIAGNOSIS — G894 Chronic pain syndrome: Secondary | ICD-10-CM | POA: Diagnosis not present

## 2016-02-25 DIAGNOSIS — F419 Anxiety disorder, unspecified: Secondary | ICD-10-CM | POA: Diagnosis not present

## 2016-02-25 DIAGNOSIS — F341 Dysthymic disorder: Secondary | ICD-10-CM | POA: Diagnosis not present

## 2016-02-25 DIAGNOSIS — G35 Multiple sclerosis: Secondary | ICD-10-CM | POA: Diagnosis not present

## 2016-02-25 DIAGNOSIS — R5383 Other fatigue: Secondary | ICD-10-CM | POA: Diagnosis not present

## 2016-03-02 DIAGNOSIS — K5190 Ulcerative colitis, unspecified, without complications: Secondary | ICD-10-CM | POA: Diagnosis not present

## 2016-03-02 DIAGNOSIS — K644 Residual hemorrhoidal skin tags: Secondary | ICD-10-CM | POA: Diagnosis not present

## 2016-03-02 DIAGNOSIS — G35 Multiple sclerosis: Secondary | ICD-10-CM | POA: Diagnosis not present

## 2016-03-03 DIAGNOSIS — Y998 Other external cause status: Secondary | ICD-10-CM | POA: Diagnosis not present

## 2016-03-03 DIAGNOSIS — M25512 Pain in left shoulder: Secondary | ICD-10-CM | POA: Diagnosis not present

## 2016-03-06 DIAGNOSIS — M5126 Other intervertebral disc displacement, lumbar region: Secondary | ICD-10-CM | POA: Diagnosis not present

## 2016-03-06 DIAGNOSIS — S4352XA Sprain of left acromioclavicular joint, initial encounter: Secondary | ICD-10-CM | POA: Diagnosis not present

## 2016-03-06 DIAGNOSIS — M25412 Effusion, left shoulder: Secondary | ICD-10-CM | POA: Diagnosis not present

## 2016-03-06 DIAGNOSIS — M25812 Other specified joint disorders, left shoulder: Secondary | ICD-10-CM | POA: Diagnosis not present

## 2016-03-06 DIAGNOSIS — M25512 Pain in left shoulder: Secondary | ICD-10-CM | POA: Diagnosis not present

## 2016-03-06 DIAGNOSIS — M47816 Spondylosis without myelopathy or radiculopathy, lumbar region: Secondary | ICD-10-CM | POA: Diagnosis not present

## 2016-03-06 DIAGNOSIS — S4992XA Unspecified injury of left shoulder and upper arm, initial encounter: Secondary | ICD-10-CM | POA: Diagnosis not present

## 2016-03-06 DIAGNOSIS — R600 Localized edema: Secondary | ICD-10-CM | POA: Diagnosis not present

## 2016-03-09 DIAGNOSIS — M5416 Radiculopathy, lumbar region: Secondary | ICD-10-CM | POA: Diagnosis not present

## 2016-03-09 DIAGNOSIS — S46012A Strain of muscle(s) and tendon(s) of the rotator cuff of left shoulder, initial encounter: Secondary | ICD-10-CM | POA: Diagnosis not present

## 2016-03-09 DIAGNOSIS — M7542 Impingement syndrome of left shoulder: Secondary | ICD-10-CM | POA: Diagnosis not present

## 2016-03-10 DIAGNOSIS — K648 Other hemorrhoids: Secondary | ICD-10-CM | POA: Diagnosis not present

## 2016-03-10 DIAGNOSIS — K649 Unspecified hemorrhoids: Secondary | ICD-10-CM | POA: Diagnosis not present

## 2016-03-10 DIAGNOSIS — K644 Residual hemorrhoidal skin tags: Secondary | ICD-10-CM | POA: Diagnosis not present

## 2016-03-26 DIAGNOSIS — J322 Chronic ethmoidal sinusitis: Secondary | ICD-10-CM | POA: Diagnosis not present

## 2016-03-26 DIAGNOSIS — J320 Chronic maxillary sinusitis: Secondary | ICD-10-CM | POA: Diagnosis not present

## 2016-03-26 DIAGNOSIS — J342 Deviated nasal septum: Secondary | ICD-10-CM | POA: Diagnosis not present

## 2016-03-26 DIAGNOSIS — J329 Chronic sinusitis, unspecified: Secondary | ICD-10-CM | POA: Diagnosis not present

## 2016-03-26 DIAGNOSIS — G35 Multiple sclerosis: Secondary | ICD-10-CM | POA: Diagnosis not present

## 2016-04-02 DIAGNOSIS — J329 Chronic sinusitis, unspecified: Secondary | ICD-10-CM | POA: Diagnosis not present

## 2016-04-02 DIAGNOSIS — J320 Chronic maxillary sinusitis: Secondary | ICD-10-CM | POA: Diagnosis not present

## 2016-04-10 DIAGNOSIS — K5190 Ulcerative colitis, unspecified, without complications: Secondary | ICD-10-CM | POA: Diagnosis not present

## 2016-04-10 DIAGNOSIS — Z6379 Other stressful life events affecting family and household: Secondary | ICD-10-CM | POA: Diagnosis not present

## 2016-04-10 DIAGNOSIS — R109 Unspecified abdominal pain: Secondary | ICD-10-CM | POA: Diagnosis not present

## 2016-04-10 DIAGNOSIS — Z599 Problem related to housing and economic circumstances, unspecified: Secondary | ICD-10-CM | POA: Diagnosis not present

## 2016-04-13 DIAGNOSIS — S46012D Strain of muscle(s) and tendon(s) of the rotator cuff of left shoulder, subsequent encounter: Secondary | ICD-10-CM | POA: Diagnosis not present

## 2016-04-13 DIAGNOSIS — M7542 Impingement syndrome of left shoulder: Secondary | ICD-10-CM | POA: Diagnosis not present

## 2016-04-13 DIAGNOSIS — G5601 Carpal tunnel syndrome, right upper limb: Secondary | ICD-10-CM | POA: Diagnosis not present

## 2016-04-13 DIAGNOSIS — M25512 Pain in left shoulder: Secondary | ICD-10-CM | POA: Diagnosis not present

## 2016-04-13 DIAGNOSIS — M25531 Pain in right wrist: Secondary | ICD-10-CM | POA: Diagnosis not present

## 2016-04-17 DIAGNOSIS — K51919 Ulcerative colitis, unspecified with unspecified complications: Secondary | ICD-10-CM | POA: Diagnosis not present

## 2016-04-17 DIAGNOSIS — L601 Onycholysis: Secondary | ICD-10-CM | POA: Diagnosis not present

## 2016-04-17 DIAGNOSIS — Z1389 Encounter for screening for other disorder: Secondary | ICD-10-CM | POA: Diagnosis not present

## 2016-04-17 DIAGNOSIS — G35 Multiple sclerosis: Secondary | ICD-10-CM | POA: Diagnosis not present

## 2016-04-17 DIAGNOSIS — F3181 Bipolar II disorder: Secondary | ICD-10-CM | POA: Diagnosis not present

## 2016-04-20 DIAGNOSIS — G35 Multiple sclerosis: Secondary | ICD-10-CM | POA: Diagnosis not present

## 2016-04-21 DIAGNOSIS — H9190 Unspecified hearing loss, unspecified ear: Secondary | ICD-10-CM | POA: Diagnosis not present

## 2016-04-21 DIAGNOSIS — F319 Bipolar disorder, unspecified: Secondary | ICD-10-CM | POA: Diagnosis not present

## 2016-04-21 DIAGNOSIS — E7800 Pure hypercholesterolemia, unspecified: Secondary | ICD-10-CM | POA: Diagnosis not present

## 2016-04-21 DIAGNOSIS — M5416 Radiculopathy, lumbar region: Secondary | ICD-10-CM | POA: Diagnosis not present

## 2016-04-21 DIAGNOSIS — Z86711 Personal history of pulmonary embolism: Secondary | ICD-10-CM | POA: Diagnosis not present

## 2016-04-21 DIAGNOSIS — G35 Multiple sclerosis: Secondary | ICD-10-CM | POA: Diagnosis not present

## 2016-04-21 DIAGNOSIS — Z87891 Personal history of nicotine dependence: Secondary | ICD-10-CM | POA: Diagnosis not present

## 2016-04-21 DIAGNOSIS — M545 Low back pain: Secondary | ICD-10-CM | POA: Diagnosis not present

## 2016-04-21 DIAGNOSIS — G8929 Other chronic pain: Secondary | ICD-10-CM | POA: Diagnosis not present

## 2016-04-21 DIAGNOSIS — M797 Fibromyalgia: Secondary | ICD-10-CM | POA: Diagnosis not present

## 2016-04-21 DIAGNOSIS — J42 Unspecified chronic bronchitis: Secondary | ICD-10-CM | POA: Diagnosis not present

## 2016-04-23 DIAGNOSIS — M797 Fibromyalgia: Secondary | ICD-10-CM | POA: Diagnosis not present

## 2016-04-23 DIAGNOSIS — M5416 Radiculopathy, lumbar region: Secondary | ICD-10-CM | POA: Diagnosis not present

## 2016-04-23 DIAGNOSIS — L601 Onycholysis: Secondary | ICD-10-CM | POA: Diagnosis not present

## 2016-04-23 DIAGNOSIS — G35 Multiple sclerosis: Secondary | ICD-10-CM | POA: Diagnosis not present

## 2016-04-23 DIAGNOSIS — Z1389 Encounter for screening for other disorder: Secondary | ICD-10-CM | POA: Diagnosis not present

## 2016-04-24 DIAGNOSIS — J343 Hypertrophy of nasal turbinates: Secondary | ICD-10-CM | POA: Diagnosis not present

## 2016-04-24 DIAGNOSIS — J3489 Other specified disorders of nose and nasal sinuses: Secondary | ICD-10-CM | POA: Diagnosis not present

## 2016-04-24 DIAGNOSIS — J320 Chronic maxillary sinusitis: Secondary | ICD-10-CM | POA: Diagnosis not present

## 2016-04-24 DIAGNOSIS — J329 Chronic sinusitis, unspecified: Secondary | ICD-10-CM | POA: Diagnosis not present

## 2016-04-24 DIAGNOSIS — J328 Other chronic sinusitis: Secondary | ICD-10-CM | POA: Diagnosis not present

## 2016-04-24 DIAGNOSIS — J322 Chronic ethmoidal sinusitis: Secondary | ICD-10-CM | POA: Diagnosis not present

## 2016-04-30 DIAGNOSIS — J320 Chronic maxillary sinusitis: Secondary | ICD-10-CM | POA: Diagnosis not present

## 2016-04-30 DIAGNOSIS — J322 Chronic ethmoidal sinusitis: Secondary | ICD-10-CM | POA: Diagnosis not present

## 2016-04-30 DIAGNOSIS — J329 Chronic sinusitis, unspecified: Secondary | ICD-10-CM | POA: Diagnosis not present

## 2016-05-01 DIAGNOSIS — L601 Onycholysis: Secondary | ICD-10-CM | POA: Diagnosis not present

## 2016-05-01 DIAGNOSIS — D171 Benign lipomatous neoplasm of skin and subcutaneous tissue of trunk: Secondary | ICD-10-CM | POA: Diagnosis not present

## 2016-05-01 DIAGNOSIS — G35 Multiple sclerosis: Secondary | ICD-10-CM | POA: Diagnosis not present

## 2016-05-06 DIAGNOSIS — R52 Pain, unspecified: Secondary | ICD-10-CM | POA: Diagnosis not present

## 2016-05-06 DIAGNOSIS — M797 Fibromyalgia: Secondary | ICD-10-CM | POA: Diagnosis not present

## 2016-05-06 DIAGNOSIS — G894 Chronic pain syndrome: Secondary | ICD-10-CM | POA: Diagnosis not present

## 2016-05-18 DIAGNOSIS — J320 Chronic maxillary sinusitis: Secondary | ICD-10-CM | POA: Diagnosis not present

## 2016-05-18 DIAGNOSIS — J322 Chronic ethmoidal sinusitis: Secondary | ICD-10-CM | POA: Diagnosis not present

## 2016-05-18 DIAGNOSIS — J329 Chronic sinusitis, unspecified: Secondary | ICD-10-CM | POA: Diagnosis not present

## 2016-05-29 ENCOUNTER — Encounter: Payer: Self-pay | Admitting: Physician Assistant

## 2016-05-29 ENCOUNTER — Ambulatory Visit (INDEPENDENT_AMBULATORY_CARE_PROVIDER_SITE_OTHER): Payer: Medicare Other | Admitting: Physician Assistant

## 2016-05-29 VITALS — BP 111/65 | HR 94 | Ht 68.0 in | Wt 178.0 lb

## 2016-05-29 DIAGNOSIS — M19049 Primary osteoarthritis, unspecified hand: Secondary | ICD-10-CM

## 2016-05-29 DIAGNOSIS — G35 Multiple sclerosis: Secondary | ICD-10-CM

## 2016-05-29 DIAGNOSIS — Z720 Tobacco use: Secondary | ICD-10-CM

## 2016-05-29 DIAGNOSIS — F411 Generalized anxiety disorder: Secondary | ICD-10-CM

## 2016-05-29 DIAGNOSIS — F3181 Bipolar II disorder: Secondary | ICD-10-CM | POA: Diagnosis not present

## 2016-05-29 DIAGNOSIS — M797 Fibromyalgia: Secondary | ICD-10-CM | POA: Insufficient documentation

## 2016-05-29 DIAGNOSIS — M1810 Unilateral primary osteoarthritis of first carpometacarpal joint, unspecified hand: Secondary | ICD-10-CM | POA: Insufficient documentation

## 2016-05-29 DIAGNOSIS — K5100 Ulcerative (chronic) pancolitis without complications: Secondary | ICD-10-CM | POA: Insufficient documentation

## 2016-05-29 DIAGNOSIS — F17200 Nicotine dependence, unspecified, uncomplicated: Secondary | ICD-10-CM | POA: Insufficient documentation

## 2016-05-29 DIAGNOSIS — M25561 Pain in right knee: Secondary | ICD-10-CM

## 2016-05-29 DIAGNOSIS — G4700 Insomnia, unspecified: Secondary | ICD-10-CM

## 2016-05-29 MED ORDER — MELOXICAM 15 MG PO TABS: 15.0000 mg | ORAL_TABLET | Freq: Every day | ORAL | Status: DC

## 2016-05-29 MED ORDER — ZOLPIDEM TARTRATE 10 MG PO TABS: 10.0000 mg | ORAL_TABLET | Freq: Every evening | ORAL | Status: DC | PRN

## 2016-05-29 MED ORDER — GABAPENTIN 300 MG PO CAPS: ORAL_CAPSULE | ORAL | Status: DC

## 2016-05-29 MED ORDER — ALPRAZOLAM 0.5 MG PO TABS: 0.5000 mg | ORAL_TABLET | Freq: Two times a day (BID) | ORAL | Status: DC | PRN
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
## 2016-06-01 ENCOUNTER — Other Ambulatory Visit: Payer: Self-pay | Admitting: Physician Assistant

## 2016-06-01 ENCOUNTER — Ambulatory Visit (INDEPENDENT_AMBULATORY_CARE_PROVIDER_SITE_OTHER): Payer: Medicare Other

## 2016-06-01 ENCOUNTER — Ambulatory Visit (INDEPENDENT_AMBULATORY_CARE_PROVIDER_SITE_OTHER): Payer: Medicare Other | Admitting: Sports Medicine

## 2016-06-01 ENCOUNTER — Encounter: Payer: Self-pay | Admitting: Sports Medicine

## 2016-06-01 ENCOUNTER — Encounter: Payer: Self-pay | Admitting: Physician Assistant

## 2016-06-01 DIAGNOSIS — F411 Generalized anxiety disorder: Secondary | ICD-10-CM | POA: Insufficient documentation

## 2016-06-01 DIAGNOSIS — M25512 Pain in left shoulder: Secondary | ICD-10-CM | POA: Diagnosis not present

## 2016-06-01 DIAGNOSIS — M1711 Unilateral primary osteoarthritis, right knee: Secondary | ICD-10-CM | POA: Insufficient documentation

## 2016-06-01 DIAGNOSIS — M25561 Pain in right knee: Secondary | ICD-10-CM | POA: Diagnosis not present

## 2016-06-01 DIAGNOSIS — M25562 Pain in left knee: Secondary | ICD-10-CM | POA: Diagnosis not present

## 2016-06-01 MED ORDER — TRAZODONE HCL 50 MG PO TABS: 50.0000 mg | ORAL_TABLET | Freq: Every evening | ORAL | 0 refills | Status: DC | PRN

## 2016-06-01 NOTE — Patient Instructions (Signed)
Will make referrals.
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
## 2016-06-01 NOTE — Assessment & Plan Note (Signed)
Symptoms are predominantly impingement related, had an unguided injection approximately a few months ago that only provided 1 week of relief. Ultrasound guided injection, she understands that we will proceed with MRI and arthroscopy if no response to this shot.

## 2016-06-01 NOTE — Assessment & Plan Note (Signed)
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 Injection as above, baseline x-rays, return to see me in one month. MRI if no better.

## 2016-06-02 ENCOUNTER — Telehealth: Payer: Self-pay | Admitting: Physician Assistant

## 2016-06-02 DIAGNOSIS — Z720 Tobacco use: Secondary | ICD-10-CM

## 2016-06-02 MED ORDER — VARENICLINE TARTRATE 0.5 MG X 11 & 1 MG X 42 PO MISC: ORAL | 0 refills | Status: DC

## 2016-06-02 MED ORDER — VARENICLINE TARTRATE 1 MG PO TABS: 1.0000 mg | ORAL_TABLET | Freq: Two times a day (BID) | ORAL | 5 refills | Status: DC

## 2016-06-02 NOTE — Telephone Encounter (Signed)
Patient called clinic today stating she is ready to stop smoking. She is going to participate in a program with [employer] to get Chantix at a discounted rate. Pt reports she has used Chantix in 2010 and 2015 successfully, then restarted due to life events. Pt reports based on recent move and surgeries she is ready to quit for the final time.  For the Rx to be approved and processed with the pharmaceutical company the script will need to be faxed attn: Nicole Kindred to F:[phone]. The Rx will need to be for the starting pack then the continuous pack. There needs to be a total of 6 month supply for approval. No further information needed other than official office fax cover sheet. Will print Rx's for PCP to sign then fax.

## 2016-06-05 DIAGNOSIS — E531 Pyridoxine deficiency: Secondary | ICD-10-CM | POA: Diagnosis not present

## 2016-06-05 DIAGNOSIS — G603 Idiopathic progressive neuropathy: Secondary | ICD-10-CM | POA: Diagnosis not present

## 2016-06-05 DIAGNOSIS — R760 Raised antibody titer: Secondary | ICD-10-CM | POA: Diagnosis not present

## 2016-06-05 DIAGNOSIS — G609 Hereditary and idiopathic neuropathy, unspecified: Secondary | ICD-10-CM | POA: Diagnosis not present

## 2016-06-05 DIAGNOSIS — G35 Multiple sclerosis: Secondary | ICD-10-CM | POA: Diagnosis not present

## 2016-06-05 DIAGNOSIS — E538 Deficiency of other specified B group vitamins: Secondary | ICD-10-CM | POA: Diagnosis not present

## 2016-06-05 DIAGNOSIS — R2681 Unsteadiness on feet: Secondary | ICD-10-CM | POA: Diagnosis not present

## 2016-06-22 DIAGNOSIS — G35 Multiple sclerosis: Secondary | ICD-10-CM | POA: Diagnosis not present

## 2016-06-22 DIAGNOSIS — R238 Other skin changes: Secondary | ICD-10-CM | POA: Diagnosis not present

## 2016-06-22 DIAGNOSIS — G603 Idiopathic progressive neuropathy: Secondary | ICD-10-CM | POA: Diagnosis not present

## 2016-06-22 DIAGNOSIS — R2681 Unsteadiness on feet: Secondary | ICD-10-CM | POA: Diagnosis not present

## 2016-06-22 DIAGNOSIS — R5383 Other fatigue: Secondary | ICD-10-CM | POA: Diagnosis not present

## 2016-06-23 DIAGNOSIS — K5100 Ulcerative (chronic) pancolitis without complications: Secondary | ICD-10-CM | POA: Diagnosis not present

## 2016-06-23 DIAGNOSIS — M797 Fibromyalgia: Secondary | ICD-10-CM | POA: Diagnosis not present

## 2016-06-23 DIAGNOSIS — K589 Irritable bowel syndrome without diarrhea: Secondary | ICD-10-CM | POA: Diagnosis not present

## 2016-06-23 DIAGNOSIS — G35 Multiple sclerosis: Secondary | ICD-10-CM | POA: Diagnosis not present

## 2016-06-25 ENCOUNTER — Telehealth: Payer: Self-pay | Admitting: Physician Assistant

## 2016-06-29 ENCOUNTER — Ambulatory Visit: Payer: Medicare Other | Admitting: Sports Medicine

## 2016-06-30 ENCOUNTER — Ambulatory Visit (INDEPENDENT_AMBULATORY_CARE_PROVIDER_SITE_OTHER): Payer: Medicare Other | Admitting: Sports Medicine

## 2016-06-30 DIAGNOSIS — M25512 Pain in left shoulder: Secondary | ICD-10-CM

## 2016-06-30 DIAGNOSIS — M2241 Chondromalacia patellae, right knee: Secondary | ICD-10-CM | POA: Diagnosis not present

## 2016-06-30 NOTE — Assessment & Plan Note (Signed)
Excellent hip abductor strength. Continue meloxicam, adding rehabilitation exercises. Injection provided partial relief. Next step after 4-6 weeks of on rehabilitation would be Visco supplementation.
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
## 2016-07-01 ENCOUNTER — Encounter (HOSPITAL_COMMUNITY): Payer: Self-pay | Admitting: Psychiatry

## 2016-07-01 ENCOUNTER — Ambulatory Visit (INDEPENDENT_AMBULATORY_CARE_PROVIDER_SITE_OTHER): Payer: Medicare Other | Admitting: Psychiatry

## 2016-07-01 VITALS — BP 130/72 | HR 98 | Ht 68.0 in | Wt 177.0 lb

## 2016-07-01 DIAGNOSIS — F411 Generalized anxiety disorder: Secondary | ICD-10-CM

## 2016-07-01 DIAGNOSIS — Z23 Encounter for immunization: Secondary | ICD-10-CM | POA: Diagnosis not present

## 2016-07-01 DIAGNOSIS — F3181 Bipolar II disorder: Secondary | ICD-10-CM | POA: Diagnosis not present

## 2016-07-01 DIAGNOSIS — G4700 Insomnia, unspecified: Secondary | ICD-10-CM | POA: Diagnosis not present

## 2016-07-01 DIAGNOSIS — M797 Fibromyalgia: Secondary | ICD-10-CM | POA: Diagnosis not present

## 2016-07-01 MED ORDER — OLANZAPINE 5 MG PO TABS: 5.0000 mg | ORAL_TABLET | Freq: Every day | ORAL | 0 refills | Status: DC
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
## 2016-07-06 ENCOUNTER — Ambulatory Visit (INDEPENDENT_AMBULATORY_CARE_PROVIDER_SITE_OTHER): Payer: Medicare Other

## 2016-07-06 DIAGNOSIS — M7592 Shoulder lesion, unspecified, left shoulder: Secondary | ICD-10-CM

## 2016-07-06 DIAGNOSIS — M25512 Pain in left shoulder: Secondary | ICD-10-CM

## 2016-07-06 DIAGNOSIS — M75102 Unspecified rotator cuff tear or rupture of left shoulder, not specified as traumatic: Secondary | ICD-10-CM | POA: Diagnosis not present

## 2016-07-08 ENCOUNTER — Ambulatory Visit: Payer: Self-pay | Admitting: Sports Medicine

## 2016-07-08 ENCOUNTER — Other Ambulatory Visit: Payer: Self-pay | Admitting: Physician Assistant

## 2016-07-09 DIAGNOSIS — Z130 Encounter for screening for diseases of the blood and blood-forming organs and certain disorders involving the immune mechanism: Secondary | ICD-10-CM | POA: Diagnosis not present

## 2016-07-09 DIAGNOSIS — E039 Hypothyroidism, unspecified: Secondary | ICD-10-CM | POA: Diagnosis not present

## 2016-07-09 DIAGNOSIS — I639 Cerebral infarction, unspecified: Secondary | ICD-10-CM | POA: Diagnosis not present

## 2016-07-09 DIAGNOSIS — D6859 Other primary thrombophilia: Secondary | ICD-10-CM | POA: Diagnosis not present

## 2016-07-09 DIAGNOSIS — D6851 Activated protein C resistance: Secondary | ICD-10-CM | POA: Diagnosis not present

## 2016-07-10 DIAGNOSIS — S43432A Superior glenoid labrum lesion of left shoulder, initial encounter: Secondary | ICD-10-CM | POA: Diagnosis not present

## 2016-07-10 DIAGNOSIS — E039 Hypothyroidism, unspecified: Secondary | ICD-10-CM | POA: Diagnosis not present

## 2016-07-10 DIAGNOSIS — Z130 Encounter for screening for diseases of the blood and blood-forming organs and certain disorders involving the immune mechanism: Secondary | ICD-10-CM | POA: Diagnosis not present

## 2016-07-10 DIAGNOSIS — D6859 Other primary thrombophilia: Secondary | ICD-10-CM | POA: Diagnosis not present

## 2016-07-10 DIAGNOSIS — I639 Cerebral infarction, unspecified: Secondary | ICD-10-CM | POA: Diagnosis not present

## 2016-07-10 DIAGNOSIS — D6851 Activated protein C resistance: Secondary | ICD-10-CM | POA: Diagnosis not present

## 2016-07-15 ENCOUNTER — Other Ambulatory Visit: Payer: Self-pay | Admitting: Orthopedic Surgery

## 2016-07-20 ENCOUNTER — Ambulatory Visit (INDEPENDENT_AMBULATORY_CARE_PROVIDER_SITE_OTHER): Payer: Medicare Other | Admitting: Physician Assistant

## 2016-07-20 ENCOUNTER — Ambulatory Visit (INDEPENDENT_AMBULATORY_CARE_PROVIDER_SITE_OTHER): Payer: Medicare Other | Admitting: Licensed Clinical Social Worker

## 2016-07-20 ENCOUNTER — Ambulatory Visit (INDEPENDENT_AMBULATORY_CARE_PROVIDER_SITE_OTHER): Payer: Medicare Other

## 2016-07-20 ENCOUNTER — Encounter: Payer: Self-pay | Admitting: Physician Assistant

## 2016-07-20 ENCOUNTER — Encounter (HOSPITAL_COMMUNITY): Payer: Self-pay | Admitting: Licensed Clinical Social Worker

## 2016-07-20 VITALS — BP 117/67 | HR 85 | Ht 68.0 in | Wt 183.0 lb

## 2016-07-20 DIAGNOSIS — F3181 Bipolar II disorder: Secondary | ICD-10-CM | POA: Diagnosis not present

## 2016-07-20 DIAGNOSIS — M25521 Pain in right elbow: Secondary | ICD-10-CM

## 2016-07-20 DIAGNOSIS — Z86711 Personal history of pulmonary embolism: Secondary | ICD-10-CM

## 2016-07-20 DIAGNOSIS — M25421 Effusion, right elbow: Secondary | ICD-10-CM | POA: Diagnosis not present

## 2016-07-20 DIAGNOSIS — Z01818 Encounter for other preprocedural examination: Secondary | ICD-10-CM

## 2016-07-20 DIAGNOSIS — F4310 Post-traumatic stress disorder, unspecified: Secondary | ICD-10-CM

## 2016-07-20 DIAGNOSIS — M7711 Lateral epicondylitis, right elbow: Secondary | ICD-10-CM

## 2016-07-20 DIAGNOSIS — F411 Generalized anxiety disorder: Secondary | ICD-10-CM | POA: Diagnosis not present

## 2016-07-20 LAB — CBC
HEMATOCRIT: 40.4 % (ref 35.0–45.0)
Hemoglobin: 13.5 g/dL (ref 11.7–15.5)
MCH: 29.2 pg (ref 27.0–33.0)
MCHC: 33.4 g/dL (ref 32.0–36.0)
MCV: 87.4 fL (ref 80.0–100.0)
MPV: 10.5 fL (ref 7.5–12.5)
Platelets: 221 10*3/uL (ref 140–400)
RBC: 4.62 MIL/uL (ref 3.80–5.10)
RDW: 14.6 % (ref 11.0–15.0)
WBC: 8.5 10*3/uL (ref 3.8–10.8)

## 2016-07-20 MED ORDER — HYDROCODONE-ACETAMINOPHEN 5-325 MG PO TABS: 1.0000 | ORAL_TABLET | Freq: Three times a day (TID) | ORAL | 0 refills | Status: DC | PRN
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
## 2016-07-20 NOTE — Progress Notes (Signed)
Right elbow intense pain pain shooting down

## 2016-07-20 NOTE — Patient Instructions (Signed)
Lateral Epicondylitis With Rehab Lateral epicondylitis involves inflammation and pain around the outer portion of the elbow. The pain is caused by inflammation of the tendons in the forearm that bring back (extend) the wrist. Lateral epicondylitis is also called tennis elbow, because it is very common in tennis players. However, it may occur in any individual who extends the wrist repetitively. If lateral epicondylitis is left untreated, it may become a chronic problem. SYMPTOMS   Pain, tenderness, and inflammation on the outer (lateral) side of the elbow.  Pain or weakness with gripping activities.  Pain that increases with wrist-twisting motions (playing tennis, using a screwdriver, opening a door or a jar).  Pain with lifting objects, including a coffee cup. CAUSES  Lateral epicondylitis is caused by inflammation of the tendons that extend the wrist. Causes of injury may include:  Repetitive stress and strain on the muscles and tendons that extend the wrist.  Sudden change in activity level or intensity.  Incorrect grip in racquet sports.  Incorrect grip size of racquet (often too large).  Incorrect hitting position or technique (usually backhand, leading with the elbow).  Using a racket that is too heavy. RISK INCREASES WITH:  Sports or occupations that require repetitive and/or strenuous forearm and wrist movements (tennis, squash, racquetball, carpentry).  Poor wrist and forearm strength and flexibility.  Failure to warm up properly before activity.  Resuming activity before healing, rehabilitation, and conditioning are complete. PREVENTION   Warm up and stretch properly before activity.  Maintain physical fitness:  Strength, flexibility, and endurance.  Cardiovascular fitness.  Wear and use properly fitted equipment.  Learn and use proper technique and have a coach correct improper technique.  Wear a tennis elbow (counterforce) brace. PROGNOSIS  The course of  this condition depends on the degree of the injury. If treated properly, acute cases (symptoms lasting less than 4 weeks) are often resolved in 2 to 6 weeks. Chronic (longer lasting cases) often resolve in 3 to 6 months but may require physical therapy. RELATED COMPLICATIONS   Frequently recurring symptoms, resulting in a chronic problem. Properly treating the problem the first time decreases frequency of recurrence.  Chronic inflammation, scarring tendon degeneration, and partial tendon tear, requiring surgery.  Delayed healing or resolution of symptoms. TREATMENT  Treatment first involves the use of ice and medicine to reduce pain and inflammation. Strengthening and stretching exercises may help reduce discomfort if performed regularly. These exercises may be performed at home if the condition is an acute injury. Chronic cases may require a referral to a physical therapist for evaluation and treatment. Your caregiver may advise a corticosteroid injection to help reduce inflammation. Rarely, surgery is needed. MEDICATION  If pain medicine is needed, nonsteroidal anti-inflammatory medicines (aspirin and ibuprofen), or other minor pain relievers (acetaminophen), are often advised.  Do not take pain medicine for 7 days before surgery.  Prescription pain relievers may be given, if your caregiver thinks they are needed. Use only as directed and only as much as you need.  Corticosteroid injections may be recommended. These injections should be reserved only for the most severe cases, because they can only be given a certain number of times. HEAT AND COLD  Cold treatment (icing) should be applied for 10 to 15 minutes every 2 to 3 hours for inflammation and pain, and immediately after activity that aggravates your symptoms. Use ice packs or an ice massage.  Heat treatment may be used before performing stretching and strengthening activities prescribed by your caregiver, physical therapist,   or  athletic trainer. Use a heat pack or a warm water soak. SEEK MEDICAL CARE IF: Symptoms get worse or do not improve in 2 weeks, despite treatment. EXERCISES  RANGE OF MOTION (ROM) AND STRETCHING EXERCISES - Epicondylitis, Lateral (Tennis Elbow) These exercises may help you when beginning to rehabilitate your injury. Your symptoms may go away with or without further involvement from your physician, physical therapist, or athletic trainer. While completing these exercises, remember:   Restoring tissue flexibility helps normal motion to return to the joints. This allows healthier, less painful movement and activity.  An effective stretch should be held for at least 30 seconds.  A stretch should never be painful. You should only feel a gentle lengthening or release in the stretched tissue. RANGE OF MOTION - Wrist Flexion, Active-Assisted  Extend your right / left elbow with your fingers pointing down.*  Gently pull the back of your hand towards you, until you feel a gentle stretch on the top of your forearm.  Hold this position for __________ seconds. Repeat __________ times. Complete this exercise __________ times per day.  *If directed by your physician, physical therapist or athletic trainer, complete this stretch with your elbow bent, rather than extended. RANGE OF MOTION - Wrist Extension, Active-Assisted  Extend your right / left elbow and turn your palm upwards.*  Gently pull your palm and fingertips back, so your wrist extends and your fingers point more toward the ground.  You should feel a gentle stretch on the inside of your forearm.  Hold this position for __________ seconds. Repeat __________ times. Complete this exercise __________ times per day. *If directed by your physician, physical therapist or athletic trainer, complete this stretch with your elbow bent, rather than extended. STRETCH - Wrist Flexion  Place the back of your right / left hand on a tabletop, leaving your  elbow slightly bent. Your fingers should point away from your body.  Gently press the back of your hand down onto the table by straightening your elbow. You should feel a stretch on the top of your forearm.  Hold this position for __________ seconds. Repeat __________ times. Complete this stretch __________ times per day.  STRETCH - Wrist Extension   Place your right / left fingertips on a tabletop, leaving your elbow slightly bent. Your fingers should point backwards.  Gently press your fingers and palm down onto the table by straightening your elbow. You should feel a stretch on the inside of your forearm.  Hold this position for __________ seconds. Repeat __________ times. Complete this stretch __________ times per day.  STRENGTHENING EXERCISES - Epicondylitis, Lateral (Tennis Elbow) These exercises may help you when beginning to rehabilitate your injury. They may resolve your symptoms with or without further involvement from your physician, physical therapist, or athletic trainer. While completing these exercises, remember:   Muscles can gain both the endurance and the strength needed for everyday activities through controlled exercises.  Complete these exercises as instructed by your physician, physical therapist or athletic trainer. Increase the resistance and repetitions only as guided.  You may experience muscle soreness or fatigue, but the pain or discomfort you are trying to eliminate should never worsen during these exercises. If this pain does get worse, stop and make sure you are following the directions exactly. If the pain is still present after adjustments, discontinue the exercise until you can discuss the trouble with your caregiver. STRENGTH - Wrist Flexors  Sit with your right / left forearm palm-up and fully supported   on a table or countertop. Your elbow should be resting below the height of your shoulder. Allow your wrist to extend over the edge of the  surface.  Loosely holding a __________ weight, or a piece of rubber exercise band or tubing, slowly curl your hand up toward your forearm.  Hold this position for __________ seconds. Slowly lower the wrist back to the starting position in a controlled manner. Repeat __________ times. Complete this exercise __________ times per day.  STRENGTH - Wrist Extensors  Sit with your right / left forearm palm-down and fully supported on a table or countertop. Your elbow should be resting below the height of your shoulder. Allow your wrist to extend over the edge of the surface.  Loosely holding a __________ weight, or a piece of rubber exercise band or tubing, slowly curl your hand up toward your forearm.  Hold this position for __________ seconds. Slowly lower the wrist back to the starting position in a controlled manner. Repeat __________ times. Complete this exercise __________ times per day.  STRENGTH - Ulnar Deviators  Stand with a ____________________ weight in your right / left hand, or sit while holding a rubber exercise band or tubing, with your healthy arm supported on a table or countertop.  Move your wrist, so that your pinkie travels toward your forearm and your thumb moves away from your forearm.  Hold this position for __________ seconds and then slowly lower the wrist back to the starting position. Repeat __________ times. Complete this exercise __________ times per day STRENGTH - Radial Deviators  Stand with a ____________________ weight in your right / left hand, or sit while holding a rubber exercise band or tubing, with your injured arm supported on a table or countertop.  Raise your hand upward in front of you or pull up on the rubber tubing.  Hold this position for __________ seconds and then slowly lower the wrist back to the starting position. Repeat __________ times. Complete this exercise __________ times per day. STRENGTH - Forearm Supinators   Sit with your right /  left forearm supported on a table, keeping your elbow below shoulder height. Rest your hand over the edge, palm down.  Gently grip a hammer or a soup ladle.  Without moving your elbow, slowly turn your palm and hand upward to a "thumbs-up" position.  Hold this position for __________ seconds. Slowly return to the starting position. Repeat __________ times. Complete this exercise __________ times per day.  STRENGTH - Forearm Pronators   Sit with your right / left forearm supported on a table, keeping your elbow below shoulder height. Rest your hand over the edge, palm up.  Gently grip a hammer or a soup ladle.  Without moving your elbow, slowly turn your palm and hand upward to a "thumbs-up" position.  Hold this position for __________ seconds. Slowly return to the starting position. Repeat __________ times. Complete this exercise __________ times per day.  STRENGTH - Grip  Grasp a tennis ball, a dense sponge, or a large, rolled sock in your hand.  Squeeze as hard as you can, without increasing any pain.  Hold this position for __________ seconds. Release your grip slowly. Repeat __________ times. Complete this exercise __________ times per day.  STRENGTH - Elbow Extensors, Isometric  Stand or sit upright, on a firm surface. Place your right / left arm so that your palm faces your stomach, and it is at the height of your waist.  Place your opposite hand on the underside   of your forearm. Gently push up as your right / left arm resists. Push as hard as you can with both arms, without causing any pain or movement at your right / left elbow. Hold this stationary position for __________ seconds. Gradually release the tension in both arms. Allow your muscles to relax completely before repeating.   This information is not intended to replace advice given to you by your health care provider. Make sure you discuss any questions you have with your health care provider.   Document Released:  10/26/2005 Document Revised: 11/16/2014 Document Reviewed: 02/07/2009 Elsevier Interactive Patient Education 2016 [employer]  

## 2016-07-21 ENCOUNTER — Other Ambulatory Visit: Payer: Self-pay | Admitting: Orthopedic Surgery

## 2016-07-21 DIAGNOSIS — M7711 Lateral epicondylitis, right elbow: Secondary | ICD-10-CM | POA: Insufficient documentation

## 2016-07-21 DIAGNOSIS — M25521 Pain in right elbow: Secondary | ICD-10-CM | POA: Insufficient documentation

## 2016-07-21 DIAGNOSIS — Z86711 Personal history of pulmonary embolism: Secondary | ICD-10-CM | POA: Insufficient documentation

## 2016-07-22 ENCOUNTER — Encounter (HOSPITAL_BASED_OUTPATIENT_CLINIC_OR_DEPARTMENT_OTHER): Payer: Self-pay | Admitting: *Deleted

## 2016-07-27 ENCOUNTER — Encounter (HOSPITAL_BASED_OUTPATIENT_CLINIC_OR_DEPARTMENT_OTHER)
Admission: RE | Disposition: A | Discharge status: Home / Self Care | Payer: Self-pay | Source: Ambulatory Visit | Attending: Orthopedic Surgery

## 2016-07-27 ENCOUNTER — Encounter (HOSPITAL_BASED_OUTPATIENT_CLINIC_OR_DEPARTMENT_OTHER): Payer: Self-pay | Admitting: Anesthesiology

## 2016-07-27 ENCOUNTER — Ambulatory Visit (HOSPITAL_BASED_OUTPATIENT_CLINIC_OR_DEPARTMENT_OTHER)
Admission: RE | Admit: 2016-07-27 | Discharge: 2016-07-27 | Disposition: A | Discharge status: Home / Self Care | Payer: No Typology Code available for payment source | Source: Ambulatory Visit | Attending: Orthopedic Surgery | Admitting: Orthopedic Surgery

## 2016-07-27 ENCOUNTER — Ambulatory Visit (HOSPITAL_BASED_OUTPATIENT_CLINIC_OR_DEPARTMENT_OTHER): Payer: No Typology Code available for payment source | Admitting: Anesthesiology

## 2016-07-27 DIAGNOSIS — M797 Fibromyalgia: Secondary | ICD-10-CM | POA: Insufficient documentation

## 2016-07-27 DIAGNOSIS — S46012A Strain of muscle(s) and tendon(s) of the rotator cuff of left shoulder, initial encounter: Secondary | ICD-10-CM | POA: Diagnosis not present

## 2016-07-27 DIAGNOSIS — G4733 Obstructive sleep apnea (adult) (pediatric): Secondary | ICD-10-CM | POA: Insufficient documentation

## 2016-07-27 DIAGNOSIS — M25512 Pain in left shoulder: Secondary | ICD-10-CM | POA: Diagnosis not present

## 2016-07-27 DIAGNOSIS — Z79899 Other long term (current) drug therapy: Secondary | ICD-10-CM | POA: Diagnosis not present

## 2016-07-27 DIAGNOSIS — G8918 Other acute postprocedural pain: Secondary | ICD-10-CM | POA: Diagnosis not present

## 2016-07-27 DIAGNOSIS — X58XXXA Exposure to other specified factors, initial encounter: Secondary | ICD-10-CM | POA: Diagnosis not present

## 2016-07-27 DIAGNOSIS — J449 Chronic obstructive pulmonary disease, unspecified: Secondary | ICD-10-CM | POA: Diagnosis not present

## 2016-07-27 DIAGNOSIS — S43432A Superior glenoid labrum lesion of left shoulder, initial encounter: Secondary | ICD-10-CM | POA: Diagnosis not present

## 2016-07-27 DIAGNOSIS — M7542 Impingement syndrome of left shoulder: Secondary | ICD-10-CM | POA: Insufficient documentation

## 2016-07-27 DIAGNOSIS — M75112 Incomplete rotator cuff tear or rupture of left shoulder, not specified as traumatic: Secondary | ICD-10-CM | POA: Diagnosis not present

## 2016-07-27 DIAGNOSIS — S46212A Strain of muscle, fascia and tendon of other parts of biceps, left arm, initial encounter: Secondary | ICD-10-CM | POA: Diagnosis not present

## 2016-07-27 DIAGNOSIS — M75102 Unspecified rotator cuff tear or rupture of left shoulder, not specified as traumatic: Secondary | ICD-10-CM | POA: Diagnosis not present

## 2016-07-27 DIAGNOSIS — F329 Major depressive disorder, single episode, unspecified: Secondary | ICD-10-CM | POA: Insufficient documentation

## 2016-07-27 DIAGNOSIS — Z87891 Personal history of nicotine dependence: Secondary | ICD-10-CM | POA: Insufficient documentation

## 2016-07-27 DIAGNOSIS — Z86711 Personal history of pulmonary embolism: Secondary | ICD-10-CM | POA: Insufficient documentation

## 2016-07-27 DIAGNOSIS — G35 Multiple sclerosis: Secondary | ICD-10-CM | POA: Insufficient documentation

## 2016-07-27 DIAGNOSIS — G4730 Sleep apnea, unspecified: Secondary | ICD-10-CM | POA: Diagnosis not present

## 2016-07-27 DIAGNOSIS — F419 Anxiety disorder, unspecified: Secondary | ICD-10-CM | POA: Insufficient documentation

## 2016-07-27 HISTORY — DX: Other specified postprocedural states: Z98.890

## 2016-07-27 HISTORY — DX: Nausea with vomiting, unspecified: R11.2

## 2016-07-27 HISTORY — DX: Ulcerative colitis, unspecified, without complications: K51.90

## 2016-07-27 HISTORY — DX: Other complications of anesthesia, initial encounter: T88.59XA

## 2016-07-27 HISTORY — PX: SHOULDER ARTHROSCOPY WITH SUBACROMIAL DECOMPRESSION AND BICEP TENDON REPAIR: SHX5689

## 2016-07-27 HISTORY — DX: Adverse effect of unspecified anesthetic, initial encounter: T41.45XA

## 2016-07-27 HISTORY — DX: Fibromyalgia: M79.7

## 2016-07-27 HISTORY — DX: Chronic obstructive pulmonary disease, unspecified: J44.9

## 2016-07-27 HISTORY — DX: Sleep apnea, unspecified: G47.30

## 2016-07-27 SURGERY — SHOULDER ARTHROSCOPY WITH SUBACROMIAL DECOMPRESSION AND BICEP TENDON REPAIR
Anesthesia: General | Site: Shoulder | Laterality: Left

## 2016-07-27 MED ORDER — ONDANSETRON HCL 4 MG/2ML IJ SOLN
INTRAMUSCULAR | Status: DC | PRN
  Administered 2016-07-27: 4 mg via INTRAVENOUS

## 2016-07-27 MED ORDER — SODIUM CHLORIDE 0.9 % IR SOLN
Status: DC | PRN
  Administered 2016-07-27: 3000 mL

## 2016-07-27 MED ORDER — BUPIVACAINE-EPINEPHRINE (PF) 0.5% -1:200000 IJ SOLN
INTRAMUSCULAR | Status: DC | PRN
  Administered 2016-07-27: 30 mL via PERINEURAL

## 2016-07-27 MED ORDER — DEXMEDETOMIDINE HCL IN NACL 200 MCG/50ML IV SOLN
INTRAVENOUS | Status: DC | PRN
  Administered 2016-07-27: 81 ug via INTRAVENOUS

## 2016-07-27 MED ORDER — PROPOFOL 10 MG/ML IV BOLUS
INTRAVENOUS | Status: DC | PRN
  Administered 2016-07-27: 300 mg via INTRAVENOUS

## 2016-07-27 MED ORDER — MIDAZOLAM HCL 2 MG/2ML IJ SOLN
INTRAMUSCULAR | Status: AC
  Filled 2016-07-27: qty 2

## 2016-07-27 MED ORDER — ACETAMINOPHEN 10 MG/ML IV SOLN
INTRAVENOUS | Status: DC | PRN
  Administered 2016-07-27: 1000 mg via INTRAVENOUS

## 2016-07-27 MED ORDER — PROMETHAZINE HCL 25 MG/ML IJ SOLN: 6.2500 mg | INTRAMUSCULAR | Status: DC | PRN

## 2016-07-27 MED ORDER — LACTATED RINGERS IV SOLN
INTRAVENOUS | Status: DC
  Administered 2016-07-27 (×3): via INTRAVENOUS

## 2016-07-27 MED ORDER — ACETAMINOPHEN 10 MG/ML IV SOLN
INTRAVENOUS | Status: AC
Start: 2016-07-27 — End: 2016-07-27
  Filled 2016-07-27: qty 100

## 2016-07-27 MED ORDER — FENTANYL CITRATE (PF) 100 MCG/2ML IJ SOLN
50.0000 ug | INTRAMUSCULAR | Status: DC | PRN
  Administered 2016-07-27: 100 ug via INTRAVENOUS

## 2016-07-27 MED ORDER — CEFAZOLIN SODIUM-DEXTROSE 2-4 GM/100ML-% IV SOLN
2.0000 g | INTRAVENOUS | Status: AC
  Administered 2016-07-27: 2 g via INTRAVENOUS

## 2016-07-27 MED ORDER — ONDANSETRON HCL 4 MG/2ML IJ SOLN
INTRAMUSCULAR | Status: AC
  Filled 2016-07-27: qty 2

## 2016-07-27 MED ORDER — FENTANYL CITRATE (PF) 100 MCG/2ML IJ SOLN
INTRAMUSCULAR | Status: DC | PRN
  Administered 2016-07-27: 100 ug via INTRAVENOUS

## 2016-07-27 MED ORDER — SCOPOLAMINE 1 MG/3DAYS TD PT72
MEDICATED_PATCH | TRANSDERMAL | Status: AC
  Filled 2016-07-27: qty 1

## 2016-07-27 MED ORDER — SUCCINYLCHOLINE CHLORIDE 20 MG/ML IJ SOLN
INTRAMUSCULAR | Status: DC | PRN
  Administered 2016-07-27: 50 mg via INTRAVENOUS

## 2016-07-27 MED ORDER — FENTANYL CITRATE (PF) 100 MCG/2ML IJ SOLN
INTRAMUSCULAR | Status: AC
  Filled 2016-07-27: qty 2

## 2016-07-27 MED ORDER — POVIDONE-IODINE 7.5 % EX SOLN: Freq: Once | CUTANEOUS | Status: DC

## 2016-07-27 MED ORDER — CEFAZOLIN SODIUM-DEXTROSE 2-4 GM/100ML-% IV SOLN
INTRAVENOUS | Status: AC
  Filled 2016-07-27: qty 100

## 2016-07-27 MED ORDER — PHENYLEPHRINE 40 MCG/ML (10ML) SYRINGE FOR IV PUSH (FOR BLOOD PRESSURE SUPPORT)
PREFILLED_SYRINGE | INTRAVENOUS | Status: AC
  Filled 2016-07-27: qty 10

## 2016-07-27 MED ORDER — DEXMEDETOMIDINE HCL IN NACL 200 MCG/50ML IV SOLN
INTRAVENOUS | Status: AC
  Filled 2016-07-27: qty 50

## 2016-07-27 MED ORDER — PHENYLEPHRINE HCL 10 MG/ML IJ SOLN
INTRAMUSCULAR | Status: DC | PRN
  Administered 2016-07-27: 80 ug via INTRAVENOUS

## 2016-07-27 MED ORDER — DEXMEDETOMIDINE HCL IN NACL 200 MCG/50ML IV SOLN
INTRAVENOUS | Status: DC | PRN
  Administered 2016-07-27: .3 ug/kg/h via INTRAVENOUS

## 2016-07-27 MED ORDER — ALBUTEROL SULFATE HFA 108 (90 BASE) MCG/ACT IN AERS
INHALATION_SPRAY | RESPIRATORY_TRACT | Status: AC
  Filled 2016-07-27: qty 6.7

## 2016-07-27 MED ORDER — HYDROMORPHONE HCL 1 MG/ML IJ SOLN
INTRAMUSCULAR | Status: AC
  Filled 2016-07-27: qty 1

## 2016-07-27 MED ORDER — MIDAZOLAM HCL 5 MG/5ML IJ SOLN
INTRAMUSCULAR | Status: DC | PRN
  Administered 2016-07-27: 2 mg via INTRAVENOUS

## 2016-07-27 MED ORDER — OXYCODONE-ACETAMINOPHEN 5-325 MG PO TABS: 1.0000 | ORAL_TABLET | ORAL | 0 refills | Status: DC | PRN

## 2016-07-27 MED ORDER — MIDAZOLAM HCL 2 MG/2ML IJ SOLN
1.0000 mg | INTRAMUSCULAR | Status: DC | PRN
  Administered 2016-07-27: 2 mg via INTRAVENOUS

## 2016-07-27 MED ORDER — DEXAMETHASONE SODIUM PHOSPHATE 4 MG/ML IJ SOLN
INTRAMUSCULAR | Status: DC | PRN
  Administered 2016-07-27: 10 mg via INTRAVENOUS

## 2016-07-27 MED ORDER — MIDAZOLAM HCL 2 MG/2ML IJ SOLN
INTRAMUSCULAR | Status: AC
Start: 2016-07-27 — End: 2016-07-27
  Filled 2016-07-27: qty 2

## 2016-07-27 MED ORDER — DEXAMETHASONE SODIUM PHOSPHATE 10 MG/ML IJ SOLN
INTRAMUSCULAR | Status: AC
  Filled 2016-07-27: qty 1

## 2016-07-27 MED ORDER — DEXMEDETOMIDINE HCL IN NACL 200 MCG/50ML IV SOLN: INTRAVENOUS | Status: DC | PRN

## 2016-07-27 MED ORDER — PROPOFOL 10 MG/ML IV BOLUS
INTRAVENOUS | Status: AC
  Filled 2016-07-27: qty 20

## 2016-07-27 MED ORDER — LIDOCAINE HCL (CARDIAC) 20 MG/ML IV SOLN
INTRAVENOUS | Status: DC | PRN
  Administered 2016-07-27: 30 mg via INTRAVENOUS

## 2016-07-27 MED ORDER — SCOPOLAMINE 1 MG/3DAYS TD PT72
1.0000 | MEDICATED_PATCH | Freq: Once | TRANSDERMAL | Status: DC | PRN
  Administered 2016-07-27: 1.5 mg via TRANSDERMAL

## 2016-07-27 MED ORDER — ALBUTEROL SULFATE HFA 108 (90 BASE) MCG/ACT IN AERS
2.0000 | INHALATION_SPRAY | RESPIRATORY_TRACT | Status: AC
  Administered 2016-07-27: 2 via RESPIRATORY_TRACT

## 2016-07-27 MED ORDER — GLYCOPYRROLATE 0.2 MG/ML IJ SOLN: 0.2000 mg | Freq: Once | INTRAMUSCULAR | Status: DC | PRN

## 2016-07-27 MED ORDER — LIDOCAINE 2% (20 MG/ML) 5 ML SYRINGE
INTRAMUSCULAR | Status: AC
  Filled 2016-07-27: qty 5

## 2016-07-27 MED ORDER — HYDROMORPHONE HCL 1 MG/ML IJ SOLN
0.2500 mg | INTRAMUSCULAR | Status: DC | PRN
  Administered 2016-07-27 (×2): 0.5 mg via INTRAVENOUS

## 2016-07-27 SURGICAL SUPPLY — 90 items
BENZOIN TINCTURE PRP APPL 2/3 (GAUZE/BANDAGES/DRESSINGS) IMPLANT
BIT DRILL 11/64XX180123XX4 (BIT) ×1
BIT DRILL 11/64XX180123XX4.3 (BIT) ×1 IMPLANT
BIT DRILL 3/32DIAX5INL DISPOSE (BIT) ×1 IMPLANT
BIT DRILL 3/32DX5IN DISP (BIT) ×1
BLADE SURG 15 STRL LF DISP TIS (BLADE) ×1 IMPLANT
BLADE SURG 15 STRL SS (BLADE) ×1
BLADE VORTEX 6.0 (BLADE) IMPLANT
BUR OVAL 4.0 (BURR) ×2 IMPLANT
CANNULA 5.75X71 LONG (CANNULA) ×2 IMPLANT
CANNULA TWIST IN 8.25X7CM (CANNULA) IMPLANT
CHLORAPREP W/TINT 26ML (MISCELLANEOUS) ×2 IMPLANT
DECANTER SPIKE VIAL GLASS SM (MISCELLANEOUS) IMPLANT
DRAPE IMP U-DRAPE 54X76 (DRAPES) ×2 IMPLANT
DRAPE INCISE IOBAN 66X45 STRL (DRAPES) ×2 IMPLANT
DRAPE STERI 35X30 U-POUCH (DRAPES) ×2 IMPLANT
DRAPE SURG 17X23 STRL (DRAPES) ×2 IMPLANT
DRAPE U-SHAPE 47X51 STRL (DRAPES) ×2 IMPLANT
DRAPE U-SHAPE 76X120 STRL (DRAPES) ×4 IMPLANT
DRILL BIT 11/64XX180123XX4.3 (BIT) ×1
DRILL BIT 3/32DIAX5INL DISPOSE (BIT) ×1
DRSG PAD ABDOMINAL 8X10 ST (GAUZE/BANDAGES/DRESSINGS) ×2 IMPLANT
ELECT BLADE 4.0 EZ CLEAN MEGAD (MISCELLANEOUS) ×2
ELECT REM PT RETURN 9FT ADLT (ELECTROSURGICAL) ×2
ELECTRODE BLDE 4.0 EZ CLN MEGD (MISCELLANEOUS) ×1 IMPLANT
ELECTRODE REM PT RTRN 9FT ADLT (ELECTROSURGICAL) ×1 IMPLANT
GAUZE SPONGE 4X4 12PLY STRL (GAUZE/BANDAGES/DRESSINGS) ×2 IMPLANT
GAUZE SPONGE 4X4 16PLY XRAY LF (GAUZE/BANDAGES/DRESSINGS) IMPLANT
GAUZE XEROFORM 1X8 LF (GAUZE/BANDAGES/DRESSINGS) ×2 IMPLANT
GLOVE BIO SURGEON STRL SZ7 (GLOVE) ×2 IMPLANT
GLOVE BIO SURGEON STRL SZ7.5 (GLOVE) ×2 IMPLANT
GLOVE BIOGEL PI IND STRL 7.0 (GLOVE) ×1 IMPLANT
GLOVE BIOGEL PI IND STRL 7.5 (GLOVE) ×1 IMPLANT
GLOVE BIOGEL PI IND STRL 8 (GLOVE) ×2 IMPLANT
GLOVE BIOGEL PI INDICATOR 7.0 (GLOVE) ×1
GLOVE BIOGEL PI INDICATOR 7.5 (GLOVE) ×1
GLOVE BIOGEL PI INDICATOR 8 (GLOVE) ×2
GLOVE SURG SS PI 7.5 STRL IVOR (GLOVE) ×2 IMPLANT
GOWN STRL REUS W/ TWL LRG LVL3 (GOWN DISPOSABLE) ×3 IMPLANT
GOWN STRL REUS W/ TWL XL LVL3 (GOWN DISPOSABLE) ×2 IMPLANT
GOWN STRL REUS W/TWL LRG LVL3 (GOWN DISPOSABLE) ×3
GOWN STRL REUS W/TWL XL LVL3 (GOWN DISPOSABLE) ×2
LASSO CRESCENT QUICKPASS (SUTURE) ×2 IMPLANT
LIQUID BAND (GAUZE/BANDAGES/DRESSINGS) ×2 IMPLANT
MANIFOLD NEPTUNE II (INSTRUMENTS) ×2 IMPLANT
NDL SUT 6 .5 CRC .975X.05 MAYO (NEEDLE) IMPLANT
NEEDLE 1/2 CIR CATGUT .05X1.09 (NEEDLE) IMPLANT
NEEDLE MAYO TAPER (NEEDLE)
NEEDLE SCORPION MULTI FIRE (NEEDLE) IMPLANT
NS IRRIG 1000ML POUR BTL (IV SOLUTION) IMPLANT
PACK ARTHROSCOPY DSU (CUSTOM PROCEDURE TRAY) ×2 IMPLANT
PACK BASIN DAY SURGERY FS (CUSTOM PROCEDURE TRAY) ×2 IMPLANT
PENCIL BUTTON HOLSTER BLD 10FT (ELECTRODE) ×2 IMPLANT
RESECTOR FULL RADIUS 4.2MM (BLADE) ×2 IMPLANT
SLEEVE SCD COMPRESS KNEE MED (MISCELLANEOUS) ×2 IMPLANT
SLING ARM FOAM STRAP LRG (SOFTGOODS) ×2 IMPLANT
SLING ARM IMMOBILIZER MED (SOFTGOODS) IMPLANT
SLING ARM MED ADULT FOAM STRAP (SOFTGOODS) IMPLANT
SLING ARM XL FOAM STRAP (SOFTGOODS) IMPLANT
SPONGE LAP 4X18 X RAY DECT (DISPOSABLE) ×2 IMPLANT
STRIP CLOSURE SKIN 1/2X4 (GAUZE/BANDAGES/DRESSINGS) IMPLANT
SUCTION FRAZIER HANDLE 10FR (MISCELLANEOUS) ×1
SUCTION TUBE FRAZIER 10FR DISP (MISCELLANEOUS) ×1 IMPLANT
SUPPORT WRAP ARM LG (MISCELLANEOUS) ×2 IMPLANT
SUT 2 FIBERLOOP 20 STRT BLUE (SUTURE) ×2
SUT BONE WAX W31G (SUTURE) IMPLANT
SUT ETHIBOND 2 OS 4 DA (SUTURE) IMPLANT
SUT ETHILON 3 0 PS 1 (SUTURE) ×2 IMPLANT
SUT ETHILON 4 0 PS 2 18 (SUTURE) IMPLANT
SUT FIBERWIRE #2 38 T-5 BLUE (SUTURE)
SUT MNCRL AB 3-0 PS2 18 (SUTURE) IMPLANT
SUT MNCRL AB 4-0 PS2 18 (SUTURE) IMPLANT
SUT PDS AB 0 CT 36 (SUTURE) IMPLANT
SUT PROLENE 3 0 PS 2 (SUTURE) IMPLANT
SUT TIGER TAPE 7 IN WHITE (SUTURE) IMPLANT
SUT VIC AB 0 CT1 27 (SUTURE)
SUT VIC AB 0 CT1 27XBRD ANBCTR (SUTURE) IMPLANT
SUT VIC AB 2-0 SH 27 (SUTURE) ×1
SUT VIC AB 2-0 SH 27XBRD (SUTURE) ×1 IMPLANT
SUTURE 2 FIBERLOOP 20 STRT BLU (SUTURE) ×1 IMPLANT
SUTURE FIBERWR #2 38 T-5 BLUE (SUTURE) IMPLANT
SYR BULB 3OZ (MISCELLANEOUS) ×2 IMPLANT
TAPE FIBER 2MM 7IN #2 BLUE (SUTURE) IMPLANT
TOWEL OR 17X24 6PK STRL BLUE (TOWEL DISPOSABLE) ×2 IMPLANT
TOWEL OR NON WOVEN STRL DISP B (DISPOSABLE) ×2 IMPLANT
TUBE CONNECTING 20X1/4 (TUBING) ×4 IMPLANT
TUBING ARTHROSCOPY IRRIG 16FT (MISCELLANEOUS) ×2 IMPLANT
WAND STAR VAC 90 (SURGICAL WAND) ×2 IMPLANT
WATER STERILE IRR 1000ML POUR (IV SOLUTION) ×2 IMPLANT
YANKAUER SUCT BULB TIP NO VENT (SUCTIONS) IMPLANT

## 2016-07-27 NOTE — Transfer of Care (Signed)
Immediate Anesthesia Transfer of Care Note  Patient: Paige Lozano  Procedure(s) Performed: Procedure(s) with comments: SHOULDER ARTHROSCOPY DEBRIDEMENT ROTATOR CUFF AND LABRUM, SUBACROMIAL DECOMPRESSION, BICEPS TENODESIS (Left) - SHOULDER ARTHROSCOPY DEBRIDEMENT ROTATOR CUFF AND LABRUM, SUBACROMIAL DECOMPRESSION, BICEPS TENOTOMY, POSSIBLE TENODESIS  Patient Location: PACU  Anesthesia Type:GA combined with regional for post-op pain  Level of Consciousness: sedated  Airway & Oxygen Therapy: Patient Spontanous Breathing and Patient connected to face mask oxygen  Post-op Assessment: Report given to RN and Post -op Vital signs reviewed and stable  Post vital signs: Reviewed and stable  Last Vitals:  Vitals:   07/27/16 1305 07/27/16 1306  BP:    Pulse: 84 88  Resp: 18   Temp:      Last Pain:  Vitals:   07/27/16 1220  TempSrc: Oral  PainSc: 7       Patients Stated Pain Goal: 5 ([id number])  Complications: No apparent anesthesia complications

## 2016-07-27 NOTE — Anesthesia Procedure Notes (Addendum)
Anesthesia Regional Block:  Interscalene brachial plexus block  Pre-Anesthetic Checklist: ,, timeout performed, Correct Patient, Correct Site, Correct Laterality, Correct Procedure, Correct Position, site marked, Risks and benefits discussed,  Surgical consent,  Pre-op evaluation,  At surgeon's request and post-op pain management  Laterality: Left  Prep: chloraprep       Needles:  Injection technique: Single-shot  Needle Type: Echogenic Stimulator Needle     Needle Length: 5cm 5 cm Needle Gauge: 22 and 22 G    Additional Needles:  Procedures: ultrasound guided (picture in chart) and nerve stimulator Interscalene brachial plexus block Narrative:  Start time: 07/27/2016 12:51 PM End time: 07/27/2016 1:02 PM Injection made incrementally with aspirations every 5 mL.  Performed by: Personally  Anesthesiologist: Duane Boston  Additional Notes: Functioning IV was confirmed and monitors applied.  A 47mm 22ga echogenic arrow stimulator was used. Sterile prep and drape,hand hygiene and sterile gloves were used.Ultrasound guidance: relevant anatomy identified, needle position confirmed, local anesthetic spread visualized around nerve(s)., vascular puncture avoided.  Image printed for medical record.  Negative aspiration and negative test dose prior to incremental administration of local anesthetic. The patient tolerated the procedure well.

## 2016-07-27 NOTE — H&P (Signed)
Paige Lozano is an 50 y.o. female.   Chief Complaint: L shoulder pain HPI: s/p [facility] with L shoulder injury SLAP tear, RC tendinosis/impingement.  Failed nonop tx.  Past Medical History:  Diagnosis Date  . Anxiety   . Bipolar 2 disorder, major depressive episode ([place])   . Complication of anesthesia   . COPD (chronic obstructive pulmonary disease) ([place])    smoker  . Fibromyalgia   . Insomnia   . Multiple sclerosis ([place])   . PONV (postoperative nausea and vomiting)   . Pulmonary embolism ([place]) 2000  . Sleep apnea    mild OSA no CPAP  . Ulcerative colitis [facility])     Past Surgical History:  Procedure Laterality Date  . FOOT SURGERY Left    bone spur  . HEMORRHOID SURGERY    . HUMERUS FRACTURE SURGERY Left   . MYOMECTOMY    . NASAL SINUS SURGERY    . THUMB ARTHROSCOPY     5 surgeries on left thumb, 4 surgeries on right  . TONSILLECTOMY AND ADENOIDECTOMY      Family History  Problem Relation Age of Onset  . Adopted: Yes   Social History:  reports that she quit smoking about 2 weeks ago. She has never used smokeless tobacco. She reports that she drinks alcohol. She reports that she uses drugs, including Marijuana.  Allergies:  Allergies  Allergen Reactions  . Ketamine Anaphylaxis  . Oxycodone Diarrhea  . Papaya Enzyme Anaphylaxis    Medications Prior to Admission  Medication Sig Dispense Refill  . acetaminophen (TYLENOL) 500 MG tablet Take 500 mg by mouth.    . ALPRAZolam (XANAX) 0.5 MG tablet Take 1 tablet (0.5 mg total) by mouth 2 (two) times daily as needed for anxiety. 45 tablet 2  . balsalazide (COLAZAL) 750 MG capsule Take 2,250 mg by mouth 3 (three) times daily.    . fluticasone (FLONASE) 50 MCG/ACT nasal spray Place into both nostrils daily.    Marland Kitchen HYDROcodone-acetaminophen (NORCO/VICODIN) 5-325 MG tablet Take 1 tablet by mouth every 8 (eight) hours as needed for moderate pain. 20 tablet 0  . meloxicam (MOBIC) 15 MG tablet Take 1 tablet (15 mg total) by mouth  daily. 30 tablet 1  . OLANZapine (ZYPREXA) 5 MG tablet Take 1 tablet (5 mg total) by mouth daily. 30 tablet 0  . traZODone (DESYREL) 50 MG tablet take 1 tablet by mouth at bedtime if needed for sleep 30 tablet 0  . varenicline (CHANTIX CONTINUING MONTH PAK) 1 MG tablet Take 1 tablet (1 mg total) by mouth 2 (two) times daily. 60 tablet 5  . zolpidem (AMBIEN) 10 MG tablet Take 1 tablet (10 mg total) by mouth at bedtime as needed for sleep. 30 tablet 1  . albuterol (PROAIR HFA) 108 (90 Base) MCG/ACT inhaler Inhale 2 puffs into the lungs every 6 (six) hours as needed.    . varenicline (CHANTIX STARTING MONTH PAK) 0.5 MG X 11 & 1 MG X 42 tablet Take one 0.5 mg tablet by mouth once daily for 3 days, then increase to one 0.5 mg tablet twice daily for 4 days, then increase to one 1 mg tablet twice daily. 53 tablet 0    No results found for this or any previous visit (from the past 48 hour(s)). No results found.  Review of Systems  All other systems reviewed and are negative.   Blood pressure (!) 120/91, pulse (!) 101, temperature 98 F (36.7 C), temperature source Oral, resp. rate 11, height 5'  8" (1.727 m), weight 81 kg (178 lb 8 oz), last menstrual period 06/30/2016, SpO2 99 %. Physical Exam  Constitutional: She is oriented to person, place, and time. She appears well-developed and well-nourished.  HENT:  Head: Atraumatic.  Eyes: EOM are normal.  Cardiovascular: Intact distal pulses.   Respiratory: Effort normal.  Musculoskeletal:  L shoulder pain with impingement and obriens.  Neurological: She is alert and oriented to person, place, and time.  Skin: Skin is warm and dry.  Psychiatric: She has a normal mood and affect.     Assessment/Plan s/p [facility] with L shoulder injury SLAP tear, RC tendinosis/impingement.  Plan L shoulder SLAP debridement vs tenodesis, debride RC, SAD Risks / benefits of surgery discussed Consent on chart  NPO for OR Preop antibiotics   Nita Sells,  MD 07/27/2016, 12:41 PM

## 2016-07-27 NOTE — Anesthesia Preprocedure Evaluation (Signed)
Anesthesia Evaluation    History of Anesthesia Complications (+) PONV and history of anesthetic complications  Airway Mallampati: II  TM Distance: >3 FB Neck ROM: Full    Dental no notable dental hx. (+) Dental Advisory Given   Pulmonary sleep apnea , COPD, former smoker,    Pulmonary exam normal        Cardiovascular negative cardio ROS Normal cardiovascular exam     Neuro/Psych PSYCHIATRIC DISORDERS Anxiety Depression Bipolar Disorder negative neurological ROS     GI/Hepatic Neg liver ROS, PUD,   Endo/Other  negative endocrine ROS  Renal/GU negative Renal ROS     Musculoskeletal  (+) Fibromyalgia -, narcotic dependent  Abdominal   Peds  Hematology negative hematology ROS (+)   Anesthesia Other Findings   Reproductive/Obstetrics                             Anesthesia Physical Anesthesia Plan  ASA: III  Anesthesia Plan: General   Post-op Pain Management: GA combined w/ Regional for post-op pain   Induction: Intravenous  Airway Management Planned: Oral ETT  Additional Equipment:   Intra-op Plan:   Post-operative Plan: Extubation in OR  Informed Consent: I have reviewed the patients History and Physical, chart, labs and discussed the procedure including the risks, benefits and alternatives for the proposed anesthesia with the patient or authorized representative who has indicated his/her understanding and acceptance.   Dental advisory given  Plan Discussed with: Anesthesiologist, CRNA and Surgeon  Anesthesia Plan Comments:         Anesthesia Quick Evaluation
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
## 2016-07-27 NOTE — Progress Notes (Signed)
Assisted Dr. Singer with left, ultrasound guided, interscalene  block. Side rails up, monitors on throughout procedure. See vital signs in flow sheet. Tolerated Procedure well.  

## 2016-07-27 NOTE — Discharge Instructions (Signed)
Discharge Instructions after Arthroscopic Shoulder Repair   A sling has been provided for you. Remain in your sling at all times. This includes sleeping in your sling.  Use ice on the shoulder intermittently over the first 48 hours after surgery.  Pain medicine has been prescribed for you.  Use your medicine liberally over the first 48 hours, and then you can begin to taper your use. You may take Extra Strength Tylenol or Tylenol only in place of the pain pills. DO NOT take ANY nonsteroidal anti-inflammatory pain medications: Advil, Motrin, Ibuprofen, Aleve, Naproxen, or Narprosyn.  You may remove your dressing after two days. If the incision sites are still moist, place a Band-Aid over the moist site(s). Change Band-Aids daily until dry.  You may shower 5 days after surgery. The incisions CANNOT get wet prior to 5 days. Simply allow the water to wash over the site and then pat dry. Do not rub the incisions. Make sure your axilla (armpit) is completely dry after showering.  Take one aspirin a day for 2 weeks after surgery, unless you have an aspirin sensitivity/ allergy or asthma.    Post Anesthesia Home Care Instructions  Activity: Get plenty of rest for the remainder of the day. A responsible adult should stay with you for 24 hours following the procedure.  For the next 24 hours, DO NOT: -Drive a car -Paediatric nurse -Drink alcoholic beverages -Take any medication unless instructed by your physician -Make any legal decisions or sign important papers.  Meals: Start with liquid foods such as gelatin or soup. Progress to regular foods as tolerated. Avoid greasy, spicy, heavy foods. If nausea and/or vomiting occur, drink only clear liquids until the nausea and/or vomiting subsides. Call your physician if vomiting continues.  Special Instructions/Symptoms: Your throat may feel dry or sore from the anesthesia or the breathing tube placed in your throat during surgery. If this causes  discomfort, gargle with warm salt water. The discomfort should disappear within 24 hours.  If you had a scopolamine patch placed behind your ear for the management of post- operative nausea and/or vomiting:  1. The medication in the patch is effective for 72 hours, after which it should be removed.  Wrap patch in a tissue and discard in the trash. Wash hands thoroughly with soap and water. 2. You may remove the patch earlier than 72 hours if you experience unpleasant side effects which may include dry mouth, dizziness or visual disturbances. 3. Avoid touching the patch. Wash your hands with soap and water after contact with the patch.   Regional Anesthesia Blocks  1. Numbness or the inability to move the "blocked" extremity may last from 3-48 hours after placement. The length of time depends on the medication injected and your individual response to the medication. If the numbness is not going away after 48 hours, call your surgeon.  2. The extremity that is blocked will need to be protected until the numbness is gone and the  Strength has returned. Because you cannot feel it, you will need to take extra care to avoid injury. Because it may be weak, you may have difficulty moving it or using it. You may not know what position it is in without looking at it while the block is in effect.  3. For blocks in the legs and feet, returning to weight bearing and walking needs to be done carefully. You will need to wait until the numbness is entirely gone and the strength has returned. You should be  able to move your leg and foot normally before you try and bear weight or walk. You will need someone to be with you when you first try to ensure you do not fall and possibly risk injury.  4. Bruising and tenderness at the needle site are common side effects and will resolve in a few days.  5. Persistent numbness or new problems with movement should be communicated to the surgeon or the [facility]  [phone] [place] [phone]).  Please call [phone] during normal business hours or [phone] after hours for any problems. Including the following:  - excessive redness of the incisions - drainage for more than 4 days - fever of more than 101.5 F  *Please note that pain medications will not be refilled after hours or on weekends.

## 2016-07-27 NOTE — Anesthesia Procedure Notes (Signed)
Procedure Name: Intubation Date/Time: 07/27/2016 1:13 PM Performed by: Marrianne Mood Pre-anesthesia Checklist: Patient identified, Emergency Drugs available, Suction available, Patient being monitored and Timeout performed Patient Re-evaluated:Patient Re-evaluated prior to inductionOxygen Delivery Method: Circle system utilized Preoxygenation: Pre-oxygenation with 100% oxygen Intubation Type: IV induction Ventilation: Mask ventilation without difficulty Laryngoscope Size: Miller and 3 Grade View: Grade II Tube type: Oral Tube size: 7.0 mm Number of attempts: 1 Airway Equipment and Method: Stylet and Oral airway Placement Confirmation: ETT inserted through vocal cords under direct vision,  positive ETCO2 and breath sounds checked- equal and bilateral Secured at: 21 cm Tube secured with: Tape Dental Injury: Teeth and Oropharynx as per pre-operative assessment

## 2016-07-27 NOTE — Anesthesia Postprocedure Evaluation (Signed)
Anesthesia Post Note  Patient: Paige Lozano  Procedure(s) Performed: Procedure(s) (LRB): SHOULDER ARTHROSCOPY DEBRIDEMENT ROTATOR CUFF AND LABRUM, SUBACROMIAL DECOMPRESSION, BICEPS TENODESIS (Left)  Patient location during evaluation: PACU Anesthesia Type: General Level of consciousness: sedated Pain management: pain level controlled Vital Signs Assessment: post-procedure vital signs reviewed and stable Respiratory status: spontaneous breathing and respiratory function stable Cardiovascular status: stable Anesthetic complications: no    Last Vitals:  Vitals:   07/27/16 1530 07/27/16 1536  BP: 113/65   Pulse: (!) 59 60  Resp: 10 13  Temp:      Last Pain:  Vitals:   07/27/16 1530  TempSrc:   PainSc: 8                  Pollie Poma DANIEL

## 2016-07-28 ENCOUNTER — Encounter (HOSPITAL_BASED_OUTPATIENT_CLINIC_OR_DEPARTMENT_OTHER): Payer: Self-pay | Admitting: Orthopedic Surgery

## 2016-07-29 ENCOUNTER — Encounter (HOSPITAL_COMMUNITY): Payer: Self-pay | Admitting: Psychiatry

## 2016-07-29 ENCOUNTER — Ambulatory Visit (INDEPENDENT_AMBULATORY_CARE_PROVIDER_SITE_OTHER): Payer: Medicare Other | Admitting: Psychiatry

## 2016-07-29 VITALS — BP 128/80 | HR 78 | Resp 14 | Ht 68.0 in | Wt 187.0 lb

## 2016-07-29 DIAGNOSIS — G4700 Insomnia, unspecified: Secondary | ICD-10-CM | POA: Diagnosis not present

## 2016-07-29 DIAGNOSIS — F411 Generalized anxiety disorder: Secondary | ICD-10-CM | POA: Diagnosis not present

## 2016-07-29 DIAGNOSIS — F4310 Post-traumatic stress disorder, unspecified: Secondary | ICD-10-CM

## 2016-07-29 DIAGNOSIS — F3181 Bipolar II disorder: Secondary | ICD-10-CM | POA: Diagnosis not present

## 2016-07-29 MED ORDER — OLANZAPINE 5 MG PO TABS: 5.0000 mg | ORAL_TABLET | Freq: Every day | ORAL | 1 refills | Status: DC
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
## 2016-07-30 DIAGNOSIS — R2681 Unsteadiness on feet: Secondary | ICD-10-CM | POA: Diagnosis not present

## 2016-07-30 DIAGNOSIS — G35 Multiple sclerosis: Secondary | ICD-10-CM | POA: Diagnosis not present

## 2016-07-30 DIAGNOSIS — R5383 Other fatigue: Secondary | ICD-10-CM | POA: Diagnosis not present

## 2016-07-30 DIAGNOSIS — G603 Idiopathic progressive neuropathy: Secondary | ICD-10-CM | POA: Diagnosis not present

## 2016-08-03 DIAGNOSIS — Z9889 Other specified postprocedural states: Secondary | ICD-10-CM | POA: Diagnosis not present

## 2016-08-11 ENCOUNTER — Ambulatory Visit (HOSPITAL_COMMUNITY): Payer: Self-pay | Admitting: Licensed Clinical Social Worker

## 2016-08-11 ENCOUNTER — Ambulatory Visit (INDEPENDENT_AMBULATORY_CARE_PROVIDER_SITE_OTHER): Payer: Medicare Other | Admitting: Physical Therapy

## 2016-08-11 ENCOUNTER — Encounter: Payer: Self-pay | Admitting: Physical Therapy

## 2016-08-11 DIAGNOSIS — M25612 Stiffness of left shoulder, not elsewhere classified: Secondary | ICD-10-CM

## 2016-08-11 DIAGNOSIS — M6281 Muscle weakness (generalized): Secondary | ICD-10-CM

## 2016-08-11 DIAGNOSIS — M25512 Pain in left shoulder: Secondary | ICD-10-CM

## 2016-08-11 DIAGNOSIS — R293 Abnormal posture: Secondary | ICD-10-CM | POA: Diagnosis not present
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
## 2016-08-11 NOTE — Patient Instructions (Addendum)
Scapular Retraction (Standing)    With arms at sides, pinch shoulder blades together. Repeat __10__ times per set. Do __1__ sets per session. Do _4-5___ sessions per day.  Strengthening: Shoulder Shrug (Phase 1)    Shrug shoulders up and down, forward and backward. Repeat _10___ times per set. Do __1__ sets per session. Do _4-5_ sessions per day.  AROM: Elbow Flexion / Extension    With left hand palm up, gently bend elbow as far as possible. Then straighten arm as far as possible. Repeat __10__ times per set. Do __1__ sets per session. Do _4-5___ sessions per day.  ROM: Pendulum (Circular)    Let right arm move in circle clockwise, then counterclockwise, by rocking body weight in circular pattern. Circle __10__ times each direction per set. Do __1__ sets per session. Do __4-5__ sessions per day.  Copyright  VHI. All rights reserved.

## 2016-08-13 ENCOUNTER — Ambulatory Visit (INDEPENDENT_AMBULATORY_CARE_PROVIDER_SITE_OTHER): Payer: Medicare Other | Admitting: Physical Therapy

## 2016-08-13 DIAGNOSIS — M6281 Muscle weakness (generalized): Secondary | ICD-10-CM

## 2016-08-13 DIAGNOSIS — R293 Abnormal posture: Secondary | ICD-10-CM | POA: Diagnosis not present

## 2016-08-13 DIAGNOSIS — M25512 Pain in left shoulder: Secondary | ICD-10-CM | POA: Diagnosis not present

## 2016-08-13 DIAGNOSIS — M25612 Stiffness of left shoulder, not elsewhere classified: Secondary | ICD-10-CM
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
## 2016-08-14 ENCOUNTER — Encounter: Payer: Self-pay | Admitting: Physician Assistant

## 2016-08-14 ENCOUNTER — Ambulatory Visit (INDEPENDENT_AMBULATORY_CARE_PROVIDER_SITE_OTHER): Payer: Medicare Other | Admitting: Physician Assistant

## 2016-08-14 VITALS — BP 118/63 | HR 107 | Ht 68.0 in | Wt 181.0 lb

## 2016-08-14 DIAGNOSIS — M25521 Pain in right elbow: Secondary | ICD-10-CM | POA: Diagnosis not present

## 2016-08-14 DIAGNOSIS — M7711 Lateral epicondylitis, right elbow: Secondary | ICD-10-CM | POA: Diagnosis not present

## 2016-08-14 MED ORDER — TRAZODONE HCL 50 MG PO TABS: ORAL_TABLET | ORAL | 1 refills | Status: DC

## 2016-08-14 MED ORDER — MELOXICAM 15 MG PO TABS: 15.0000 mg | ORAL_TABLET | Freq: Every day | ORAL | 1 refills | Status: DC

## 2016-08-14 NOTE — Patient Instructions (Signed)
Lateral Epicondylitis With Rehab Lateral epicondylitis involves inflammation and pain around the outer portion of the elbow. The pain is caused by inflammation of the tendons in the forearm that bring back (extend) the wrist. Lateral epicondylitis is also called tennis elbow, because it is very common in tennis players. However, it may occur in any individual who extends the wrist repetitively. If lateral epicondylitis is left untreated, it may become a chronic problem. SYMPTOMS   Pain, tenderness, and inflammation on the outer (lateral) side of the elbow.  Pain or weakness with gripping activities.  Pain that increases with wrist-twisting motions (playing tennis, using a screwdriver, opening a door or a jar).  Pain with lifting objects, including a coffee cup. CAUSES  Lateral epicondylitis is caused by inflammation of the tendons that extend the wrist. Causes of injury may include:  Repetitive stress and strain on the muscles and tendons that extend the wrist.  Sudden change in activity level or intensity.  Incorrect grip in racquet sports.  Incorrect grip size of racquet (often too large).  Incorrect hitting position or technique (usually backhand, leading with the elbow).  Using a racket that is too heavy. RISK INCREASES WITH:  Sports or occupations that require repetitive and/or strenuous forearm and wrist movements (tennis, squash, racquetball, carpentry).  Poor wrist and forearm strength and flexibility.  Failure to warm up properly before activity.  Resuming activity before healing, rehabilitation, and conditioning are complete. PREVENTION   Warm up and stretch properly before activity.  Maintain physical fitness:  Strength, flexibility, and endurance.  Cardiovascular fitness.  Wear and use properly fitted equipment.  Learn and use proper technique and have a coach correct improper technique.  Wear a tennis elbow (counterforce) brace. PROGNOSIS  The course of  this condition depends on the degree of the injury. If treated properly, acute cases (symptoms lasting less than 4 weeks) are often resolved in 2 to 6 weeks. Chronic (longer lasting cases) often resolve in 3 to 6 months but may require physical therapy. RELATED COMPLICATIONS   Frequently recurring symptoms, resulting in a chronic problem. Properly treating the problem the first time decreases frequency of recurrence.  Chronic inflammation, scarring tendon degeneration, and partial tendon tear, requiring surgery.  Delayed healing or resolution of symptoms. TREATMENT  Treatment first involves the use of ice and medicine to reduce pain and inflammation. Strengthening and stretching exercises may help reduce discomfort if performed regularly. These exercises may be performed at home if the condition is an acute injury. Chronic cases may require a referral to a physical therapist for evaluation and treatment. Your caregiver may advise a corticosteroid injection to help reduce inflammation. Rarely, surgery is needed. MEDICATION  If pain medicine is needed, nonsteroidal anti-inflammatory medicines (aspirin and ibuprofen), or other minor pain relievers (acetaminophen), are often advised.  Do not take pain medicine for 7 days before surgery.  Prescription pain relievers may be given, if your caregiver thinks they are needed. Use only as directed and only as much as you need.  Corticosteroid injections may be recommended. These injections should be reserved only for the most severe cases, because they can only be given a certain number of times. HEAT AND COLD  Cold treatment (icing) should be applied for 10 to 15 minutes every 2 to 3 hours for inflammation and pain, and immediately after activity that aggravates your symptoms. Use ice packs or an ice massage.  Heat treatment may be used before performing stretching and strengthening activities prescribed by your caregiver, physical therapist,   or  athletic trainer. Use a heat pack or a warm water soak. SEEK MEDICAL CARE IF: Symptoms get worse or do not improve in 2 weeks, despite treatment. EXERCISES  RANGE OF MOTION (ROM) AND STRETCHING EXERCISES - Epicondylitis, Lateral (Tennis Elbow) These exercises may help you when beginning to rehabilitate your injury. Your symptoms may go away with or without further involvement from your physician, physical therapist, or athletic trainer. While completing these exercises, remember:   Restoring tissue flexibility helps normal motion to return to the joints. This allows healthier, less painful movement and activity.  An effective stretch should be held for at least 30 seconds.  A stretch should never be painful. You should only feel a gentle lengthening or release in the stretched tissue. RANGE OF MOTION - Wrist Flexion, Active-Assisted  Extend your right / left elbow with your fingers pointing down.*  Gently pull the back of your hand towards you, until you feel a gentle stretch on the top of your forearm.  Hold this position for __________ seconds. Repeat __________ times. Complete this exercise __________ times per day.  *If directed by your physician, physical therapist or athletic trainer, complete this stretch with your elbow bent, rather than extended. RANGE OF MOTION - Wrist Extension, Active-Assisted  Extend your right / left elbow and turn your palm upwards.*  Gently pull your palm and fingertips back, so your wrist extends and your fingers point more toward the ground.  You should feel a gentle stretch on the inside of your forearm.  Hold this position for __________ seconds. Repeat __________ times. Complete this exercise __________ times per day. *If directed by your physician, physical therapist or athletic trainer, complete this stretch with your elbow bent, rather than extended. STRETCH - Wrist Flexion  Place the back of your right / left hand on a tabletop, leaving your  elbow slightly bent. Your fingers should point away from your body.  Gently press the back of your hand down onto the table by straightening your elbow. You should feel a stretch on the top of your forearm.  Hold this position for __________ seconds. Repeat __________ times. Complete this stretch __________ times per day.  STRETCH - Wrist Extension   Place your right / left fingertips on a tabletop, leaving your elbow slightly bent. Your fingers should point backwards.  Gently press your fingers and palm down onto the table by straightening your elbow. You should feel a stretch on the inside of your forearm.  Hold this position for __________ seconds. Repeat __________ times. Complete this stretch __________ times per day.  STRENGTHENING EXERCISES - Epicondylitis, Lateral (Tennis Elbow) These exercises may help you when beginning to rehabilitate your injury. They may resolve your symptoms with or without further involvement from your physician, physical therapist, or athletic trainer. While completing these exercises, remember:   Muscles can gain both the endurance and the strength needed for everyday activities through controlled exercises.  Complete these exercises as instructed by your physician, physical therapist or athletic trainer. Increase the resistance and repetitions only as guided.  You may experience muscle soreness or fatigue, but the pain or discomfort you are trying to eliminate should never worsen during these exercises. If this pain does get worse, stop and make sure you are following the directions exactly. If the pain is still present after adjustments, discontinue the exercise until you can discuss the trouble with your caregiver. STRENGTH - Wrist Flexors  Sit with your right / left forearm palm-up and fully supported   on a table or countertop. Your elbow should be resting below the height of your shoulder. Allow your wrist to extend over the edge of the  surface.  Loosely holding a __________ weight, or a piece of rubber exercise band or tubing, slowly curl your hand up toward your forearm.  Hold this position for __________ seconds. Slowly lower the wrist back to the starting position in a controlled manner. Repeat __________ times. Complete this exercise __________ times per day.  STRENGTH - Wrist Extensors  Sit with your right / left forearm palm-down and fully supported on a table or countertop. Your elbow should be resting below the height of your shoulder. Allow your wrist to extend over the edge of the surface.  Loosely holding a __________ weight, or a piece of rubber exercise band or tubing, slowly curl your hand up toward your forearm.  Hold this position for __________ seconds. Slowly lower the wrist back to the starting position in a controlled manner. Repeat __________ times. Complete this exercise __________ times per day.  STRENGTH - Ulnar Deviators  Stand with a ____________________ weight in your right / left hand, or sit while holding a rubber exercise band or tubing, with your healthy arm supported on a table or countertop.  Move your wrist, so that your pinkie travels toward your forearm and your thumb moves away from your forearm.  Hold this position for __________ seconds and then slowly lower the wrist back to the starting position. Repeat __________ times. Complete this exercise __________ times per day STRENGTH - Radial Deviators  Stand with a ____________________ weight in your right / left hand, or sit while holding a rubber exercise band or tubing, with your injured arm supported on a table or countertop.  Raise your hand upward in front of you or pull up on the rubber tubing.  Hold this position for __________ seconds and then slowly lower the wrist back to the starting position. Repeat __________ times. Complete this exercise __________ times per day. STRENGTH - Forearm Supinators   Sit with your right /  left forearm supported on a table, keeping your elbow below shoulder height. Rest your hand over the edge, palm down.  Gently grip a hammer or a soup ladle.  Without moving your elbow, slowly turn your palm and hand upward to a "thumbs-up" position.  Hold this position for __________ seconds. Slowly return to the starting position. Repeat __________ times. Complete this exercise __________ times per day.  STRENGTH - Forearm Pronators   Sit with your right / left forearm supported on a table, keeping your elbow below shoulder height. Rest your hand over the edge, palm up.  Gently grip a hammer or a soup ladle.  Without moving your elbow, slowly turn your palm and hand upward to a "thumbs-up" position.  Hold this position for __________ seconds. Slowly return to the starting position. Repeat __________ times. Complete this exercise __________ times per day.  STRENGTH - Grip  Grasp a tennis ball, a dense sponge, or a large, rolled sock in your hand.  Squeeze as hard as you can, without increasing any pain.  Hold this position for __________ seconds. Release your grip slowly. Repeat __________ times. Complete this exercise __________ times per day.  STRENGTH - Elbow Extensors, Isometric  Stand or sit upright, on a firm surface. Place your right / left arm so that your palm faces your stomach, and it is at the height of your waist.  Place your opposite hand on the underside   of your forearm. Gently push up as your right / left arm resists. Push as hard as you can with both arms, without causing any pain or movement at your right / left elbow. Hold this stationary position for __________ seconds. Gradually release the tension in both arms. Allow your muscles to relax completely before repeating.   This information is not intended to replace advice given to you by your health care provider. Make sure you discuss any questions you have with your health care provider.   Document Released:  10/26/2005 Document Revised: 11/16/2014 Document Reviewed: 02/07/2009 Elsevier Interactive Patient Education 2016 [employer]  

## 2016-08-14 NOTE — Assessment & Plan Note (Signed)
Degenerative changes on x-ray, visible effusion, and referable to both the common extensor tendon origin and joint. Joint injection as above, return in one month.

## 2016-08-14 NOTE — Progress Notes (Signed)
   Subjective:    I'm seeing this patient as a consultation for:  Iran Planas, PA-C  CC: Right elbow pain  HPI: This is a pleasant 50 year old female, she is post left subacromial decompression and biceps tenodesis. Unfortunately has started have pain in her right elbow, worse with flexion and extension. Previous x-ray show degenerative changes and effusion. Pain is moderate, persistent without radiation.  Past medical history:  Negative.  See flowsheet/record as well for more information.  Surgical history: Negative.  See flowsheet/record as well for more information.  Family history: Negative.  See flowsheet/record as well for more information.  Social history: Negative.  See flowsheet/record as well for more information.  Allergies, and medications have been entered into the medical record, reviewed, and no changes needed.   Review of Systems: No headache, visual changes, nausea, vomiting, diarrhea, constipation, dizziness, abdominal pain, skin rash, fevers, chills, night sweats, weight loss, swollen lymph nodes, body aches, joint swelling, muscle aches, chest pain, shortness of breath, mood changes, visual or auditory hallucinations.   Objective:   General: Well Developed, well nourished, and in no acute distress.  Neuro/Psych: Alert and oriented x3, extra-ocular muscles intact, able to move all 4 extremities, sensation grossly intact. Skin: Warm and dry, no rashes noted.  Respiratory: Not using accessory muscles, speaking in full sentences, trachea midline.  Cardiovascular: Pulses palpable, no extremity edema. Abdomen: Does not appear distended. Right Elbow: Visibly swollen, tenderness over the anconeus muscle as well as over the common extensor tendon origin, reproduction of pain with Strength is full to all of the above directions Stable to varus, valgus stress. Negative moving valgus stress test. No discrete areas of tenderness to palpation. Ulnar nerve does not  sublux. Negative cubital tunnel Tinel's.  Procedure: Real-time Ultrasound Guided Injection of right elbow joint Device: GE Logiq E  Verbal informed consent obtained.  Time-out conducted.  Noted no overlying erythema, induration, or other signs of local infection.  Skin prepped in a sterile fashion.  Local anesthesia: Topical Ethyl chloride.  With sterile technique and under real time ultrasound guidance:  25-gauge needle advanced through the anconeus muscle, into the elbow joint, 1 mL kenalog 40, 1 mL lidocaine, 1 mL Marcaine injected easily Completed without difficulty  Pain immediately resolved suggesting accurate placement of the medication.  Advised to call if fevers/chills, erythema, induration, drainage, or persistent bleeding.  Images permanently stored and available for review in the ultrasound unit.  Impression: Technically successful ultrasound guided injection.  Impression and Recommendations:   This case required medical decision making of moderate complexity.  Right elbow pain Degenerative changes on x-ray, visible effusion, and referable to both the common extensor tendon origin and joint. Joint injection as above, return in one month.

## 2016-08-15 ENCOUNTER — Encounter: Payer: Self-pay | Admitting: Physician Assistant

## 2016-08-15 NOTE — Progress Notes (Signed)
   Subjective:    Patient ID: Lyrika Adornetto, female    DOB: Dec 28, 1965, 50 y.o.   MRN: [id number]  HPI Pt presents to the clinic with right elbow pain that has not resolved with brace, NSAID and rest. She continues to have this pain. It hurts now to even hold her coffee mug. She is going to PT for left shoulder and they have worked some with right elbow. Xray showed some degenerative changes.    Review of Systems See HPI.     Objective:   Physical Exam  Constitutional: She is oriented to person, place, and time. She appears well-developed and well-nourished.  HENT:  Head: Normocephalic and atraumatic.  Cardiovascular: Normal rate, regular rhythm and normal heart sounds.   Pulmonary/Chest: Effort normal and breath sounds normal.  Musculoskeletal:  Tenderness over right lateral epicondyle and lateral joint space. Mild effusion noted.   Neurological: She is alert and oriented to person, place, and time.  Psychiatric: She has a normal mood and affect. Her behavior is normal.          Assessment & Plan:  Right elbow pain- consulted Dr. Darene Lamer see note. Due to effusion believes pain is coming more from a joint patholoy. Xray did show some arthritis.  mobic given. Encouraged icing.  Follow up with Dr. Darene Lamer as needed.

## 2016-08-17 ENCOUNTER — Ambulatory Visit (INDEPENDENT_AMBULATORY_CARE_PROVIDER_SITE_OTHER): Payer: Medicare Other | Admitting: Rehabilitative and Restorative Service Providers"

## 2016-08-17 ENCOUNTER — Encounter: Payer: Self-pay | Admitting: Rehabilitative and Restorative Service Providers"

## 2016-08-17 DIAGNOSIS — M25612 Stiffness of left shoulder, not elsewhere classified: Secondary | ICD-10-CM | POA: Diagnosis present

## 2016-08-17 DIAGNOSIS — M6281 Muscle weakness (generalized): Secondary | ICD-10-CM | POA: Diagnosis not present

## 2016-08-17 DIAGNOSIS — M25512 Pain in left shoulder: Secondary | ICD-10-CM

## 2016-08-17 DIAGNOSIS — R293 Abnormal posture: Secondary | ICD-10-CM | POA: Diagnosis not present

## 2016-08-17 NOTE — Therapy (Signed)
[place], [place], [address] Phone: [phone]   Fax:  [phone]  Physical Therapy Treatment  Patient Details  Name: Paige Lozano MRN: [id number] Date of Birth: 12/06/1965 Referring Provider: Dr Tania Ade  Encounter Date: 08/17/2016      PT End of Session - 08/17/16 1107    Visit Number 3   Number of Visits 18   Date for PT Re-Evaluation 09/22/16   PT Start Time 1102   PT Stop Time 1205   PT Time Calculation (min) 63 min   Activity Tolerance Patient tolerated treatment well      Past Medical History:  Diagnosis Date  . Anxiety   . Bipolar 2 disorder, major depressive episode ([place])   . Complication of anesthesia   . COPD (chronic obstructive pulmonary disease) ([place])    smoker  . Fibromyalgia   . Insomnia   . Multiple sclerosis ([place])   . PONV (postoperative nausea and vomiting)   . Pulmonary embolism ([place]) 2000  . Sleep apnea    mild OSA no CPAP  . Ulcerative colitis [facility])     Past Surgical History:  Procedure Laterality Date  . FOOT SURGERY Left    bone spur  . HEMORRHOID SURGERY    . HUMERUS FRACTURE SURGERY Left   . MYOMECTOMY    . NASAL SINUS SURGERY    . SHOULDER ARTHROSCOPY WITH SUBACROMIAL DECOMPRESSION AND BICEP TENDON REPAIR Left 07/27/2016   Procedure: SHOULDER ARTHROSCOPY DEBRIDEMENT ROTATOR CUFF AND LABRUM, SUBACROMIAL DECOMPRESSION, BICEPS TENODESIS;  Surgeon: Tania Ade, MD;  Location: [facility];  Service: Orthopedics;  Laterality: Left;  SHOULDER ARTHROSCOPY DEBRIDEMENT ROTATOR CUFF AND LABRUM, SUBACROMIAL DECOMPRESSION, BICEPS TENOTOMY, POSSIBLE TENODESIS  . THUMB ARTHROSCOPY     5 surgeries on left thumb, 4 surgeries on right  . TONSILLECTOMY AND ADENOIDECTOMY      There were no vitals filed for this visit.      Subjective Assessment - 08/17/16 1107    Subjective Patient reports that she was out of the sling a good bit  yesterday. Cleaned her house yesterday. Increased pain last night - difficulty sleeping. Interested in returning to the pool ASAP    Currently in Pain? Yes   Pain Score 6    Pain Location Shoulder   Pain Orientation Left;Lateral   Pain Descriptors / Indicators Aching;Tightness            [facility] PT Assessment - 08/17/16 0001      Assessment   Medical Diagnosis Lt shoulder scope, biceps tenotomy & open tenodesis   Referring Provider Dr Tania Ade   Onset Date/Surgical Date 07/27/16   Hand Dominance Right   Next MD Visit 09/07/16   Prior Therapy not since surgery.      AROM   Left Elbow Flexion 143   Left Elbow Extension 0     PROM   Left Shoulder Flexion 155 Degrees   Left Shoulder ABduction 159 Degrees   Left Shoulder Internal Rotation 88 Degrees   Left Shoulder External Rotation 92 Degrees                     [facility] Adult PT Treatment/Exercise - 08/17/16 0001      Shoulder Exercises: Supine   Other Supine Exercises scap squeeze x 5 sec hold x 10 reps   with swim noodle      Shoulder Exercises: Standing   External Rotation AAROM;Left;10 reps  standing w/ noodle assist  with cane for AA ROM   Other Standing Exercises scap squeeze with noodle 10 sec x 10    Other Standing Exercises shoulder flexion stretch standing with hands on counter stepping back 20 sec hold x 4      Shoulder Exercises: Stretch   Other Shoulder Stretches standing ER with cane noodle along spine 10 sec hold x10      Electrical Stimulation   Electrical Stimulation Location Lt shoulder    Electrical Stimulation Action IFC   Electrical Stimulation Parameters to tolerance   Electrical Stimulation Goals Pain;Tone     Vasopneumatic   Number Minutes Vasopneumatic  15 minutes   Vasopnuematic Location  Shoulder   Vasopneumatic Pressure Low   Vasopneumatic Temperature  3*     Manual Therapy   Manual Therapy Myofascial release;Soft tissue mobilization;Taping;Scapular  mobilization;Passive ROM   Manual therapy comments pt supine    Soft tissue mobilization upper trap; leveator; pecs; arm    Myofascial Release to Lt pec, rhomboid, upper trap, cervical paraspinals    Scapular Mobilization Lt scapula in all directions    Passive ROM Lt shoulder flexion, scaption, ER, IR, ext with pt in hooklying                 PT Education - 08/17/16 1147    Education provided Yes   Education Details HEP    Methods Explanation;Demonstration;Tactile cues;Verbal cues;Handout   Comprehension Verbalized understanding;Returned demonstration;Verbal cues required;Tactile cues required             PT Long Term Goals - 08/13/16 1146      PT LONG TERM GOAL #1   Title I with advanced HEP to include water classes ( 09/22/16)    Time 6   Period Weeks   Status On-going     PT LONG TERM GOAL #2   Title demo Lt shoulder ROM WFL ( 09/22/16)    Time 6   Period Weeks   Status On-going     PT LONG TERM GOAL #3   Title tolerate initial Lt UE strengthening ( 09/22/16)    Time 6   Period Weeks   Status On-going     PT LONG TERM GOAL #4   Title improve FOTO =/< 44% limited ( 09/22/16)    Time 6   Period Weeks   Status On-going     PT LONG TERM GOAL #5   Title Walk and play with dogs without difficulty ( 09/22/16)    Time 6   Period Weeks   Status On-going               Plan - 08/17/16 1307    Clinical Impression Statement Removed tape along upper trap and deltoid. Left tape along the arm for decreasing edema and ecchymosis. Good response to tape and patient will re-tape on Wednesday. Good response to manual work with focus on muscular tightness through the teres and periscapular musculature. Great gains in ROM. Encouraged patient to use sling when up working with the Rt UE - OK to be out of sling when she is home trying to rest her UE on a pillow for support. Progressing well.    Rehab Potential Excellent   PT Frequency 3x / week   PT Duration 6  weeks   PT Treatment/Interventions Moist Heat;Ultrasound;Therapeutic exercise;Dry needling;Taping;Vasopneumatic Device;Manual techniques;Cryotherapy;Electrical Stimulation;Passive range of motion;Patient/family education   PT Next Visit Plan no resisted Lt biceps until 09/07/16, progress AAROM Lt shoulder - re-assess and re-apply Roc tape at next  visit. Continue with AAROM; manual work Lt shoudler girdle - progress with posterior shoulder girdle stabilization    Consulted and Agree with Plan of Care Patient      Patient will benefit from skilled therapeutic intervention in order to improve the following deficits and impairments:  Postural dysfunction, Increased edema, Decreased strength, Pain, Impaired UE functional use, Increased muscle spasms  Visit Diagnosis: Stiffness of left shoulder, not elsewhere classified  Acute pain of left shoulder  Abnormal posture  Muscle weakness (generalized)     Problem List Patient Active Problem List   Diagnosis Date Noted  . History of pulmonary embolus (PE) 07/21/2016  . Right elbow pain 07/21/2016  . Right lateral epicondylitis 07/21/2016  . GAD (generalized anxiety disorder) 07/20/2016  . 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 Anxiety state 06/01/2016  . Left shoulder pain 06/01/2016  . Multiple sclerosis ([place]) 05/29/2016  . Fibromyalgia 05/29/2016  . 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 Insomnia 05/29/2016  . Ulcerative pancolitis without complication ([place]) [id number]  . Bipolar 2 disorder ([place]) 05/29/2016  . Current smoker 05/29/2016    Brenin Heidelberger Nilda Simmer PT, MPH  08/17/2016, 1:16 PM  [facility] [place], [place], [address] Phone: [phone]   Fax:  [phone]  Name: Paige Lozano MRN: [id number] Date of Birth: 1966/10/09

## 2016-08-17 NOTE — Patient Instructions (Addendum)
External Rotator Cuff Stretch, Sitting (Passive)   Can do in standing stepping back  Sit with elbow bent, forearm on table, palm down. Bend forward at waist until a stretch is felt in shoulder. Hold _20__ seconds.  Repeat _3-4__ times per session. Do _2-3_ sessions per day.

## 2016-08-18 ENCOUNTER — Ambulatory Visit (HOSPITAL_COMMUNITY): Payer: Self-pay | Admitting: Licensed Clinical Social Worker

## 2016-08-19 ENCOUNTER — Ambulatory Visit (INDEPENDENT_AMBULATORY_CARE_PROVIDER_SITE_OTHER): Payer: Medicare Other | Admitting: Psychiatry

## 2016-08-19 ENCOUNTER — Ambulatory Visit (INDEPENDENT_AMBULATORY_CARE_PROVIDER_SITE_OTHER): Payer: Medicare Other | Admitting: Physical Therapy

## 2016-08-19 DIAGNOSIS — M25612 Stiffness of left shoulder, not elsewhere classified: Secondary | ICD-10-CM | POA: Diagnosis present

## 2016-08-19 DIAGNOSIS — F3181 Bipolar II disorder: Secondary | ICD-10-CM

## 2016-08-19 DIAGNOSIS — F1290 Cannabis use, unspecified, uncomplicated: Secondary | ICD-10-CM

## 2016-08-19 DIAGNOSIS — R293 Abnormal posture: Secondary | ICD-10-CM | POA: Diagnosis not present

## 2016-08-19 DIAGNOSIS — F4310 Post-traumatic stress disorder, unspecified: Secondary | ICD-10-CM | POA: Diagnosis not present

## 2016-08-19 DIAGNOSIS — M25512 Pain in left shoulder: Secondary | ICD-10-CM | POA: Diagnosis not present

## 2016-08-19 DIAGNOSIS — Z87891 Personal history of nicotine dependence: Secondary | ICD-10-CM | POA: Diagnosis not present

## 2016-08-19 DIAGNOSIS — M6281 Muscle weakness (generalized): Secondary | ICD-10-CM | POA: Diagnosis not present

## 2016-08-19 DIAGNOSIS — F5102 Adjustment insomnia: Secondary | ICD-10-CM

## 2016-08-19 DIAGNOSIS — F411 Generalized anxiety disorder: Secondary | ICD-10-CM | POA: Diagnosis not present

## 2016-08-19 MED ORDER — OLANZAPINE 2.5 MG PO TABS: 2.5000 mg | ORAL_TABLET | Freq: Every day | ORAL | 0 refills | Status: DC
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
## 2016-08-19 NOTE — Therapy (Signed)
[place], [place], [address] Phone: [phone]   Fax:  [phone]  Physical Therapy Treatment  Patient Details  Name: Paige Lozano MRN: [id number] Date of Birth: 1966-04-25 Referring Provider: Dr Tania Ade  Encounter Date: 08/19/2016      PT End of Session - 08/19/16 1112    Visit Number 4   Number of Visits 18   Date for PT Re-Evaluation 09/22/16   PT Start Time 1104   PT Stop Time 1208   PT Time Calculation (min) 64 min      Past Medical History:  Diagnosis Date  . Anxiety   . Bipolar 2 disorder, major depressive episode ([place])   . Complication of anesthesia   . COPD (chronic obstructive pulmonary disease) ([place])    smoker  . Fibromyalgia   . Insomnia   . Multiple sclerosis ([place])   . PONV (postoperative nausea and vomiting)   . Pulmonary embolism ([place]) 2000  . Sleep apnea    mild OSA no CPAP  . Ulcerative colitis [facility])     Past Surgical History:  Procedure Laterality Date  . FOOT SURGERY Left    bone spur  . HEMORRHOID SURGERY    . HUMERUS FRACTURE SURGERY Left   . MYOMECTOMY    . NASAL SINUS SURGERY    . SHOULDER ARTHROSCOPY WITH SUBACROMIAL DECOMPRESSION AND BICEP TENDON REPAIR Left 07/27/2016   Procedure: SHOULDER ARTHROSCOPY DEBRIDEMENT ROTATOR CUFF AND LABRUM, SUBACROMIAL DECOMPRESSION, BICEPS TENODESIS;  Surgeon: Tania Ade, MD;  Location: [facility];  Service: Orthopedics;  Laterality: Left;  SHOULDER ARTHROSCOPY DEBRIDEMENT ROTATOR CUFF AND LABRUM, SUBACROMIAL DECOMPRESSION, BICEPS TENOTOMY, POSSIBLE TENODESIS  . THUMB ARTHROSCOPY     5 surgeries on left thumb, 4 surgeries on right  . TONSILLECTOMY AND ADENOIDECTOMY      There were no vitals filed for this visit.      Subjective Assessment - 08/19/16 1119    Subjective Pt reports she was very sore for the next day.  Took medicine and used ice on shoulder to relieve pain.    Currently in  Pain? Yes   Pain Score 4    Pain Location Shoulder  bicep (midbelly)   Pain Orientation Left   Pain Descriptors / Indicators Dull   Aggravating Factors  moving arm    Pain Relieving Factors medication, ice             [facility] PT Assessment - 08/19/16 0001      Assessment   Medical Diagnosis Lt shoulder scope, biceps tenotomy & open tenodesis   Onset Date/Surgical Date 07/27/16   Hand Dominance Right   Next MD Visit 09/07/16     PROM   Right/Left Shoulder Right;Left   Right Shoulder Flexion 164 Degrees   Left Shoulder Flexion 155 Degrees  with pulleys AAROM          [facility] Adult PT Treatment/Exercise - 08/19/16 0001      Shoulder Exercises: Prone   Retraction Strengthening;10 reps  5 sec hold,  LUE off table   Extension Left;5 reps  LUE, to neutral     Shoulder Exercises: Standing   External Rotation AAROM;Left;10 reps  standing w/ noodle assist with cane for AA ROM   Other Standing Exercises scap squeeze with noodle 10 sec x 10   Other Standing Exercises pendulum  x 20 reps      Shoulder Exercises: Pulleys   Flexion 3 minutes   ABduction 2 minutes  scaption     Modalities   Modalities Oceanographer Action IFC   Electrical Stimulation Parameters to tolerance    Electrical Stimulation Goals Pain     Ultrasound   Ultrasound Location Lt ant shoulder / bicep area   Ultrasound Parameters 50%, 3.3 mHz, 1.0 w/cm2, 8 min    Ultrasound Goals Edema;Pain     Vasopneumatic   Number Minutes Vasopneumatic  15 minutes   Vasopnuematic Location  Shoulder   Vasopneumatic Pressure Low   Vasopneumatic Temperature  3*           PT Long Term Goals - 08/13/16 1146      PT LONG TERM GOAL #1   Title I with advanced HEP to include water classes ( 09/22/16)    Time 6   Period Weeks   Status On-going     PT LONG TERM GOAL #2   Title  demo Lt shoulder ROM WFL ( 09/22/16)    Time 6   Period Weeks   Status On-going     PT LONG TERM GOAL #3   Title tolerate initial Lt UE strengthening ( 09/22/16)    Time 6   Period Weeks   Status On-going     PT LONG TERM GOAL #4   Title improve FOTO =/< 44% limited ( 09/22/16)    Time 6   Period Weeks   Status On-going     PT LONG TERM GOAL #5   Title Walk and play with dogs without difficulty ( 09/22/16)    Time 6   Period Weeks   Status On-going               Plan - 08/19/16 1208    Rehab Potential Excellent   PT Frequency 3x / week   PT Duration 6 weeks   PT Treatment/Interventions Moist Heat;Ultrasound;Therapeutic exercise;Dry needling;Taping;Vasopneumatic Device;Manual techniques;Cryotherapy;Electrical Stimulation;Passive range of motion;Patient/family education   PT Next Visit Plan no resisted Lt biceps until 09/07/16, progress AAROM Lt shoulder - re-assess and re-apply [place] tape at next visit. Continue with AAROM; manual work Lt shoudler girdle - progress with posterior shoulder girdle stabilization       Patient will benefit from skilled therapeutic intervention in order to improve the following deficits and impairments:  Postural dysfunction, Increased edema, Decreased strength, Pain, Impaired UE functional use, Increased muscle spasms  Visit Diagnosis: Stiffness of left shoulder, not elsewhere classified  Acute pain of left shoulder  Abnormal posture  Muscle weakness (generalized)     Problem List Patient Active Problem List   Diagnosis Date Noted  . History of pulmonary embolus (PE) 07/21/2016  . Right elbow pain 07/21/2016  . Right lateral epicondylitis 07/21/2016  . GAD (generalized anxiety disorder) 07/20/2016  . 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 Anxiety state 06/01/2016  . Left shoulder pain 06/01/2016  . Multiple sclerosis ([place]) 05/29/2016  . Fibromyalgia 05/29/2016  . 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  Insomnia 05/29/2016  . Ulcerative pancolitis without complication ([place]) [id number]  . Bipolar 2 disorder ([place]) 05/29/2016  . Current smoker 05/29/2016   Kerin Perna, PTA 08/19/16 12:14 PM  [facility] [place], [place], [address] Phone: [phone]   Fax:  [phone]  Name: Paige Lozano MRN: [id number] Date of Birth: [birth year]

## 2016-08-21 ENCOUNTER — Ambulatory Visit (INDEPENDENT_AMBULATORY_CARE_PROVIDER_SITE_OTHER): Payer: Medicare Other | Admitting: Physical Therapy

## 2016-08-21 DIAGNOSIS — M6281 Muscle weakness (generalized): Secondary | ICD-10-CM | POA: Diagnosis not present

## 2016-08-21 DIAGNOSIS — M25612 Stiffness of left shoulder, not elsewhere classified: Secondary | ICD-10-CM | POA: Diagnosis present

## 2016-08-21 DIAGNOSIS — R293 Abnormal posture: Secondary | ICD-10-CM | POA: Diagnosis not present

## 2016-08-21 DIAGNOSIS — M25512 Pain in left shoulder: Secondary | ICD-10-CM

## 2016-08-21 NOTE — Therapy (Signed)
[facility] [place], [place], [address] Phone: [phone]   Fax:  [phone]  Physical Therapy Treatment  Patient Details  Name: Paige Lozano MRN: [id number] Date of Birth: 30-Nov-1965 Referring Provider: Dr Tania Ade  Encounter Date: 08/21/2016      PT End of Session - 08/21/16 1144    Visit Number 5   Number of Visits 18   Date for PT Re-Evaluation 09/22/16   PT Start Time 1103   PT Stop Time 1157   PT Time Calculation (min) 54 min   Activity Tolerance Patient tolerated treatment well;Patient limited by pain      Past Medical History:  Diagnosis Date  . Anxiety   . Bipolar 2 disorder, major depressive episode ([place])   . Complication of anesthesia   . COPD (chronic obstructive pulmonary disease) ([place])    smoker  . Fibromyalgia   . Insomnia   . Multiple sclerosis ([place])   . PONV (postoperative nausea and vomiting)   . Pulmonary embolism ([place]) 2000  . Sleep apnea    mild OSA no CPAP  . Ulcerative colitis [facility])     Past Surgical History:  Procedure Laterality Date  . FOOT SURGERY Left    bone spur  . HEMORRHOID SURGERY    . HUMERUS FRACTURE SURGERY Left   . MYOMECTOMY    . NASAL SINUS SURGERY    . SHOULDER ARTHROSCOPY WITH SUBACROMIAL DECOMPRESSION AND BICEP TENDON REPAIR Left 07/27/2016   Procedure: SHOULDER ARTHROSCOPY DEBRIDEMENT ROTATOR CUFF AND LABRUM, SUBACROMIAL DECOMPRESSION, BICEPS TENODESIS;  Surgeon: Tania Ade, MD;  Location: [place];  Service: Orthopedics;  Laterality: Left;  SHOULDER ARTHROSCOPY DEBRIDEMENT ROTATOR CUFF AND LABRUM, SUBACROMIAL DECOMPRESSION, BICEPS TENOTOMY, POSSIBLE TENODESIS  . THUMB ARTHROSCOPY     5 surgeries on left thumb, 4 surgeries on right  . TONSILLECTOMY AND ADENOIDECTOMY      There were no vitals filed for this visit.      Subjective Assessment - 08/21/16 1340    Subjective Pt reports she may have overdone it  yesterday while working on photo collage with arm out of sling.  She has increased soreness, and reports bruises look bigger today.     Patient Stated Goals return to swimming, water classes, walk dogs.    Currently in Pain? Yes   Pain Score 7    Pain Location Shoulder   Pain Orientation Left   Pain Descriptors / Indicators Sore;Aching            [facility] PT Assessment - 08/21/16 0001      Assessment   Medical Diagnosis Lt shoulder scope, biceps tenotomy & open tenodesis   Onset Date/Surgical Date 07/27/16   Hand Dominance Right   Next MD Visit 09/07/16          [facility] Adult PT Treatment/Exercise - 08/21/16 0001      Modalities   Modalities Electrical Stimulation;Ultrasound;Vasopneumatic     Electrical Stimulation   Electrical Stimulation Location Lt shoulder    Electrical Stimulation Action IFC   Electrical Stimulation Parameters  to tolerance    Electrical Stimulation Goals Pain     Ultrasound   Ultrasound Location Lt ant shoulder and biceps belly    Ultrasound Parameters 50%, 3.3 mHz, 1.0 w/cm2, 8 min    Ultrasound Goals Edema;Pain     Vasopneumatic   Number Minutes Vasopneumatic  15 minutes   Vasopnuematic Location  Shoulder  Lt    Vasopneumatic Pressure Low   Vasopneumatic  Temperature  3*     Manual Therapy   Soft tissue mobilization TPR to Lt levator and upper trap.    Myofascial Release to Lt pec, rhomboid, upper trap, cervical paraspinals.  suboccipital release.  TPR to Lt scalenes.     Scapular Mobilization Lt scapula in all directions    Passive ROM Lt shoulder scaption and ER with pt in [place];Inhibit Muscle;Facilitate Muscle     Kinesiotix   Edema octopus strip  (prox to distal) applied prox humerus to distal bicep to decrease edema and bruising.    Create Space I lift strip applied to Lt levator attachment on scapula to decrease pain.     Inhibit Muscle  I strip applied with 10% stretch to Lt levator                       PT Long Term Goals - 08/13/16 1146      PT LONG TERM GOAL #1   Title I with advanced HEP to include water classes ( 09/22/16)    Time 6   Period Weeks   Status On-going     PT LONG TERM GOAL #2   Title demo Lt shoulder ROM WFL ( 09/22/16)    Time 6   Period Weeks   Status On-going     PT LONG TERM GOAL #3   Title tolerate initial Lt UE strengthening ( 09/22/16)    Time 6   Period Weeks   Status On-going     PT LONG TERM GOAL #4   Title improve FOTO =/< 44% limited ( 09/22/16)    Time 6   Period Weeks   Status On-going     PT LONG TERM GOAL #5   Title Walk and play with dogs without difficulty ( 09/22/16)    Time 6   Period Weeks   Status On-going               Plan - 08/21/16 1350    Clinical Impression Statement Pt responded well to manual therapy today, noting decreased in some pain.  Further reduction of pain with use of estim / vaso.  Encouraged pt to maintain shoulder precautions to avoid further injury.     Rehab Potential Excellent   PT Frequency 3x / week   PT Duration 6 weeks   PT Treatment/Interventions Moist Heat;Ultrasound;Therapeutic exercise;Dry needling;Taping;Vasopneumatic Device;Manual techniques;Cryotherapy;Electrical Stimulation;Passive range of motion;Patient/family education   PT Next Visit Plan no resisted Lt biceps until 09/07/16, progress AAROM Lt shoulder - re-assess and re-apply Rock tape at next visit. Continue with AAROM; manual work Lt shoulder girdle - progress with posterior shoulder girdle stabilization    Consulted and Agree with Plan of Care Patient      Patient will benefit from skilled therapeutic intervention in order to improve the following deficits and impairments:  Postural dysfunction, Increased edema, Decreased strength, Pain, Impaired UE functional use, Increased muscle spasms  Visit Diagnosis: Stiffness of left shoulder, not elsewhere classified  Acute pain of left  shoulder  Abnormal posture  Muscle weakness (generalized)     Problem List Patient Active Problem List   Diagnosis Date Noted  . History of pulmonary embolus (PE) 07/21/2016  . Right elbow pain 07/21/2016  . Right lateral epicondylitis 07/21/2016  . GAD (generalized anxiety disorder) 07/20/2016  . 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 Anxiety state 06/01/2016  . Left shoulder pain 06/01/2016  . Multiple sclerosis ([place]) 05/29/2016  .  Fibromyalgia 05/29/2016  . 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 Insomnia 05/29/2016  . Ulcerative pancolitis without complication ([place]) [id number]  . Bipolar 2 disorder ([place]) 05/29/2016  . Current smoker 05/29/2016   Kerin Perna, PTA 08/21/16 1:52 PM  [facility] [place], [place], [address] Phone: [phone]   Fax:  [phone]  Name: Paige Lozano MRN: [id number] Date of Birth: 04/08/1966

## 2016-08-25 ENCOUNTER — Ambulatory Visit (HOSPITAL_COMMUNITY): Payer: Self-pay | Admitting: Licensed Clinical Social Worker

## 2016-08-25 ENCOUNTER — Ambulatory Visit (INDEPENDENT_AMBULATORY_CARE_PROVIDER_SITE_OTHER): Payer: Medicare Other | Admitting: Physical Therapy

## 2016-08-25 DIAGNOSIS — M6281 Muscle weakness (generalized): Secondary | ICD-10-CM

## 2016-08-25 DIAGNOSIS — M25512 Pain in left shoulder: Secondary | ICD-10-CM | POA: Diagnosis not present

## 2016-08-25 DIAGNOSIS — R293 Abnormal posture: Secondary | ICD-10-CM | POA: Diagnosis not present

## 2016-08-25 DIAGNOSIS — M25612 Stiffness of left shoulder, not elsewhere classified: Secondary | ICD-10-CM

## 2016-08-25 NOTE — Therapy (Signed)
[facility] [place], [place], [address] Phone: [phone]   Fax:  [phone]  Physical Therapy Treatment  Patient Details  Name: Paige Lozano MRN: [id number] Date of Birth: 11-09-1966 Referring Provider: Dr Tania Ade  Encounter Date: 08/25/2016      PT End of Session - 08/25/16 0927    Visit Number 6   Number of Visits 18   Date for PT Re-Evaluation 09/22/16   PT Start Time [id number]   PT Stop Time 0933  Pt had to leave for another appt   PT Time Calculation (min) 41 min   Activity Tolerance Patient tolerated treatment well      Past Medical History:  Diagnosis Date  . Anxiety   . Bipolar 2 disorder, major depressive episode ([place])   . Complication of anesthesia   . COPD (chronic obstructive pulmonary disease) ([place])    smoker  . Fibromyalgia   . Insomnia   . Multiple sclerosis ([place])   . PONV (postoperative nausea and vomiting)   . Pulmonary embolism ([place]) 2000  . Sleep apnea    mild OSA no CPAP  . Ulcerative colitis [facility])     Past Surgical History:  Procedure Laterality Date  . FOOT SURGERY Left    bone spur  . HEMORRHOID SURGERY    . HUMERUS FRACTURE SURGERY Left   . MYOMECTOMY    . NASAL SINUS SURGERY    . SHOULDER ARTHROSCOPY WITH SUBACROMIAL DECOMPRESSION AND BICEP TENDON REPAIR Left 07/27/2016   Procedure: SHOULDER ARTHROSCOPY DEBRIDEMENT ROTATOR CUFF AND LABRUM, SUBACROMIAL DECOMPRESSION, BICEPS TENODESIS;  Surgeon: Tania Ade, MD;  Location: [place];  Service: Orthopedics;  Laterality: Left;  SHOULDER ARTHROSCOPY DEBRIDEMENT ROTATOR CUFF AND LABRUM, SUBACROMIAL DECOMPRESSION, BICEPS TENOTOMY, POSSIBLE TENODESIS  . THUMB ARTHROSCOPY     5 surgeries on left thumb, 4 surgeries on right  . TONSILLECTOMY AND ADENOIDECTOMY      There were no vitals filed for this visit.      Subjective Assessment - 08/25/16 0928    Subjective Pt reports she has tried to be  good and stay in sling, not use arm.  Things have calmed down a lot.    Currently in Pain? Yes   Pain Score 3    Pain Location Shoulder   Pain Orientation Left   Pain Descriptors / Indicators Aching   Aggravating Factors  moving arm    Pain Relieving Factors medication, ice             [facility] PT Assessment - 08/25/16 0001      Assessment   Medical Diagnosis Lt shoulder scope, biceps tenotomy & open tenodesis   Onset Date/Surgical Date 07/27/16   Hand Dominance Right   Next MD Visit 09/07/16     PROM   Right Shoulder Flexion 164 Degrees  with cane           [facility] Adult PT Treatment/Exercise - 08/25/16 0001      Shoulder Exercises: Supine   Flexion AAROM;Both;15 reps  with wand, press and overhead   Other Supine Exercises hands behind head, resting elbow at side (pillow supporting elbow)     Shoulder Exercises: Prone   Retraction AROM;Left;10 reps   Extension Left;AROM;20 reps  arm by side      Shoulder Exercises: Sidelying   External Rotation AROM;Strengthening;10 reps  10 reps AROM, then 10 with 1# wt.    External Rotation Weight (lbs) 1#     Shoulder Exercises: Standing  Other Standing Exercises wall ladder, LUE flexion x 10 reps (up to #26)   Other Standing Exercises pendulum  x 20 reps      Shoulder Exercises: Pulleys   Flexion 2 minutes   ABduction 2 minutes     Electrical Stimulation   Electrical Stimulation Location Lt shoulder    Electrical Stimulation Action IFC   Electrical Stimulation Parameters to tolerance    Electrical Stimulation Goals Pain     Vasopneumatic   Number Minutes Vasopneumatic  10 minutes   Vasopnuematic Location  Shoulder   Vasopneumatic Pressure Low   Vasopneumatic Temperature  3*     Kinesiotix   Edema octopus strip  (prox to distal) applied prox humerus to distal bicep to decrease edema and bruising.    Create Space I lift strip applied to Lt levator attachment on scapula to decrease pain.     Inhibit Muscle  I strip  applied with 10% stretch to Lt levator            PT Long Term Goals - 08/13/16 1146      PT LONG TERM GOAL #1   Title I with advanced HEP to include water classes ( 09/22/16)    Time 6   Period Weeks   Status On-going     PT LONG TERM GOAL #2   Title demo Lt shoulder ROM WFL ( 09/22/16)    Time 6   Period Weeks   Status On-going     PT LONG TERM GOAL #3   Title tolerate initial Lt UE strengthening ( 09/22/16)    Time 6   Period Weeks   Status On-going     PT LONG TERM GOAL #4   Title improve FOTO =/< 44% limited ( 09/22/16)    Time 6   Period Weeks   Status On-going     PT LONG TERM GOAL #5   Title Walk and play with dogs without difficulty ( 09/22/16)    Time 6   Period Weeks   Status On-going               Plan - 08/25/16 1318    Clinical Impression Statement Pt tolerated all ROM and scapular strengthening exercises without any increaes in pain.   Pain was reduced with use of estim/ vaso.  Pt continues to have positive response withRock tape; reapplied today.    Rehab Potential Excellent   PT Frequency 3x / week   PT Duration 6 weeks   PT Treatment/Interventions Moist Heat;Ultrasound;Therapeutic exercise;Dry needling;Taping;Vasopneumatic Device;Manual techniques;Cryotherapy;Electrical Stimulation;Passive range of motion;Patient/family education   PT Next Visit Plan no resisted Lt biceps until 09/07/16, progress AAROM Lt shoulder - Continue with AAROM; manual work Lt shoulder girdle - progress with posterior shoulder girdle stabilization    Consulted and Agree with Plan of Care Patient      Patient will benefit from skilled therapeutic intervention in order to improve the following deficits and impairments:  Postural dysfunction, Increased edema, Decreased strength, Pain, Impaired UE functional use, Increased muscle spasms  Visit Diagnosis: Stiffness of left shoulder, not elsewhere classified  Acute pain of left shoulder  Abnormal posture  Muscle  weakness (generalized)     Problem List Patient Active Problem List   Diagnosis Date Noted  . History of pulmonary embolus (PE) 07/21/2016  . Right elbow pain 07/21/2016  . Right lateral epicondylitis 07/21/2016  . GAD (generalized anxiety disorder) 07/20/2016  . 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 Anxiety state 06/01/2016  .  Left shoulder pain 06/01/2016  . Multiple sclerosis ([place]) 05/29/2016  . Fibromyalgia 05/29/2016  . 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 Insomnia 05/29/2016  . Ulcerative pancolitis without complication ([place]) [id number]  . Bipolar 2 disorder ([place]) 05/29/2016  . Current smoker 05/29/2016   Kerin Perna, PTA 08/25/16 1:20 PM  [facility] [place] [place], [place], [address] Phone: [phone]   Fax:  [phone]  Name: Paige Lozano MRN: [id number] Date of Birth: Apr 12, 1966

## 2016-08-31 ENCOUNTER — Ambulatory Visit (INDEPENDENT_AMBULATORY_CARE_PROVIDER_SITE_OTHER): Payer: Medicare Other | Admitting: Physical Therapy

## 2016-08-31 DIAGNOSIS — M25512 Pain in left shoulder: Secondary | ICD-10-CM | POA: Diagnosis not present

## 2016-08-31 DIAGNOSIS — M25612 Stiffness of left shoulder, not elsewhere classified: Secondary | ICD-10-CM

## 2016-08-31 DIAGNOSIS — M6281 Muscle weakness (generalized): Secondary | ICD-10-CM | POA: Diagnosis not present

## 2016-08-31 DIAGNOSIS — R293 Abnormal posture: Secondary | ICD-10-CM

## 2016-08-31 NOTE — Therapy (Signed)
Paige Lozano, [place], [address] Phone: [phone]   Fax:  [phone]  Physical Therapy Treatment  Patient Details  Name: Paige Lozano MRN: [id number] Date of Birth: [birth year] Referring Provider: Dr Tania Ade  Encounter Date: 08/31/2016      PT End of Session - 08/31/16 1108    Visit Number 7   Number of Visits 18   Date for PT Re-Evaluation 09/22/16   PT Start Time 1103   PT Stop Time 1207   PT Time Calculation (min) 64 min   Activity Tolerance Patient tolerated treatment well   Behavior During Therapy [facility] for tasks assessed/performed      Past Medical History:  Diagnosis Date  . Anxiety   . Bipolar 2 disorder, major depressive episode ([place]ige Lozano)   . Complication of anesthesia   . COPD (chronic obstructive pulmonary disease) ([place] Loz[place])    smoker  . Fibromyalgia   . Insomnia   . Multiple sclerosis (Paige Lozano)   . PONV (postoperative nausea and vomiting)   . Pulmonary embolism (Paige Lozano) 2000  . Sleep apnea    mild OSA no CPAP  . Ulcerative colitis Paige Lozano)     Past Surgical History:  Procedure Laterality Date  . FOOT SURGERY Left    bone spur  . HEMORRHOID SURGERY    . HUMERUS FRACTURE SURGERY Left   . MYOMECTOMY    . NASAL SINUS SURGERY    . SHOULDER ARTHROSCOPY WITH SUBACROMIAL DECOMPRESSION AND BICEP TENDON REPAIR Left 07/27/2016   Procedure: SHOULDER ARTHROSCOPY DEBRIDEMENT ROTATOR CUFF AND LABRUM, SUBACROMIAL DECOMPRESSION, BICEPS TENODESIS;  Surgeon: Tania Ade, MD;  Location: [place];  Service: Orthopedics;  Laterality: Left;  SHOULDER ARTHROSCOPY DEBRIDEMENT ROTATOR CUFF AND LABRUM, SUBACROMIAL DECOMPRESSION, BICEPS TENOTOMY, POSSIBLE TENODESIS  . THUMB ARTHROSCOPY     5 surgeries on left thumb, 4 surgeries on right  . TONSILLECTOMY AND ADENOIDECTOMY      There were no vitals filed for this visit.      Subjective Assessment - 08/31/16 1108    Subjective [name]  states she called Dr office and she will be released from sling and be able to increase resistance 5# each week (after follow up).     Currently in Pain? Yes   Pain Score 1    Pain Location Shoulder   Pain Orientation Left  in bicep area (midbelly)   Pain Descriptors / Indicators Sore   Aggravating Factors  laying on it, overuse if out of sling.    Pain Relieving Factors ice             [facility] PT Assessment - 08/31/16 0001      Assessment   Medical Diagnosis Lt shoulder scope, biceps tenotomy & open tenodesis   Onset Date/Surgical Date 07/27/16   Hand Dominance Right   Next MD Visit 09/07/16     AROM   Left Shoulder Flexion 165 Degrees  standing          [facility] Adult PT Treatment/Exercise - 08/31/16 0001      Shoulder Exercises: Supine   Other Supine Exercises hands behind head, resting elbow at side (pillow supporting elbow)     Shoulder Exercises: Prone   Extension Left;5 reps  to neutral   Horizontal ABduction 1 Left;5 reps   Other Prone Exercises Prone row with LUE x 8 reps,   horiz abd with ER x 10 reps      Shoulder Exercises: Standing   Other Standing Exercises wall  ladder, LUE flexion x 5 reps, scaption x 5 reps (up to #28)   Other Standing Exercises Scap squeeze with flexion to 90 deg x 10 reps;  Reverse wall push ups (elbows into wall) x10     Electrical Stimulation   Electrical Stimulation Location Lt shoulder    Electrical Stimulation Action IFC   Electrical Stimulation Parameters to tolerance    Electrical Stimulation Goals Pain     Ultrasound   Ultrasound Location Lt ant shoulder    Ultrasound Parameters 50%, 3.3 mHz, 1.0 w/cm 2 x 8 min    Ultrasound Goals Edema;Pain     Vasopneumatic   Number Minutes Vasopneumatic  10 minutes   Vasopnuematic Location  Shoulder   Vasopneumatic Pressure Low   Vasopneumatic Temperature  3*     Manual Therapy   Soft tissue mobilization TPR to Lt levator, upper trap, rhomboid, infraspinatus, teres minor.     Scapular Mobilization Lt scapula in all directions      Kinesiotix   Edema octopus strip  (prox to distal) applied prox humerus to distal bicep to decrease edema and bruising.    Create Space I lift strip applied to Lt teres minor attachment to decrease pain.             PT Long Term Goals - 08/13/16 1146      PT LONG TERM GOAL #1   Title I with advanced HEP to include water classes ( 09/22/16)    Time 6   Period Weeks   Status On-going     PT LONG TERM GOAL #2   Title demo Lt shoulder ROM WFL ( 09/22/16)    Time 6   Period Weeks   Status On-going     PT LONG TERM GOAL #3   Title tolerate initial Lt UE strengthening ( 09/22/16)    Time 6   Period Weeks   Status On-going     PT LONG TERM GOAL #4   Title improve FOTO =/< 44% limited ( 09/22/16)    Time 6   Period Weeks   Status On-going     PT LONG TERM GOAL #5   Title Walk and play with dogs without difficulty ( 09/22/16)    Time 6   Period Weeks   Status On-going               Plan - 08/31/16 1313    Clinical Impression Statement Pt demonstrated improved Lt shoulder AROM.  She is demonstrating improved ROM in all directions and tolerating exercises with minimal increase in pain/ minimal substitutions.  Continues to have positive response of decreased bruising/ pain in Lt shoulder with Rock tape application. She remains point tender in lateral Lt shoulder just inf to deltoid tub.  Progressing towards goals.    Rehab Potential Excellent   PT Frequency 3x / week   PT Duration 6 weeks   PT Treatment/Interventions Moist Heat;Ultrasound;Therapeutic exercise;Dry needling;Taping;Vasopneumatic Device;Manual techniques;Cryotherapy;Electrical Stimulation;Passive range of motion;Patient/family education   PT Next Visit Plan no resisted Lt biceps until 09/07/16, progress AAROM Lt shoulder - Continue with AROM; manual work Lt shoulder girdle - progress with posterior shoulder girdle stabilization    Consulted and Agree  with Plan of Care Patient      Patient will benefit from skilled therapeutic intervention in order to improve the following deficits and impairments:  Postural dysfunction, Increased edema, Decreased strength, Pain, Impaired UE functional use, Increased muscle spasms  Visit Diagnosis: Stiffness of left shoulder, not elsewhere classified  Acute pain of left shoulder  Abnormal posture  Muscle weakness (generalized)     Problem List Patient Active Problem List   Diagnosis Date Noted  . History of pulmonary embolus (PE) 07/21/2016  . Right elbow pain 07/21/2016  . Right lateral epicondylitis 07/21/2016  . GAD (generalized anxiety disorder) 07/20/2016  . 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 Anxiety state 06/01/2016  . Left shoulder pain 06/01/2016  . Multiple sclerosis ([place]) 05/29/2016  . Fibromyalgia 05/29/2016  . 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 Insomnia 05/29/2016  . Ulcerative pancolitis without complication ([place]) [id number]  . Bipolar 2 disorder ([place]) 05/29/2016  . Current smoker 05/29/2016   Kerin Perna, PTA 08/31/16 1:21 PM  [facility] [place], [place], [address] Phone: [phone]   Fax:  [phone]  Name: Meuy Binetti MRN: [id number] Date of Birth: 1966/01/26

## 2016-09-01 ENCOUNTER — Ambulatory Visit (INDEPENDENT_AMBULATORY_CARE_PROVIDER_SITE_OTHER): Payer: Medicare Other | Admitting: Licensed Clinical Social Worker

## 2016-09-01 DIAGNOSIS — F5102 Adjustment insomnia: Secondary | ICD-10-CM

## 2016-09-01 DIAGNOSIS — F411 Generalized anxiety disorder: Secondary | ICD-10-CM | POA: Diagnosis not present

## 2016-09-01 DIAGNOSIS — G35 Multiple sclerosis: Secondary | ICD-10-CM | POA: Diagnosis not present

## 2016-09-01 DIAGNOSIS — F4310 Post-traumatic stress disorder, unspecified: Secondary | ICD-10-CM

## 2016-09-01 NOTE — Progress Notes (Signed)
   THERAPIST PROGRESS NOTE  Session Time: 3:03 PM to 3:57 PM  Participation Level: Active  Behavioral Response: CasualAlertEuthymic  Type of Therapy: Individual Therapy  Treatment Goals addressed:  continued to make positive strides and motivation and attitude, decrease in anxiety and learning and applying positive coping strategies to cope with anxiety, decrease and PTSD symptoms  Interventions: Solution Focused, Strength-based, Supportive and Other: Problem solving skills  Summary: Paige Lozano is a 50 y.o. female who presents her efforts to getting out and putting her in itself in a mindset that will help her and having a positive attitude as well as working on her physical issues. Worked on treatment goals with therapist and she wants supporting continuing her progress, managing anxiety and developing positive coping strategies and decrease and PTSD symptoms. Updated therapist on stressors and they are only a little better. There is still conflict with her relationship with friend who lives in [place] and not sure yet what she is willing to accept. She has a friend, Education officer, environmental, with mental health issues and he can push her buttons that she doesn't have control over.  her tings. Went to [employer] and it was helpful. Friend staying with her, [name] is having mental health issues. She recognizes a fine line between continuing to be able to afford with her positive transition and also being a lifeline to him. She has set parameters and discussed with therapist that a part of the parameters would be seeing forward momentum in his addressing his symptoms. She feels good that her ex-husband and her are in the future planning to be together and feels good that she has worked on some of the issues he identified as issues in the relationship. She feels good about working on these things, about herself and wants to work on herself so that she is able to enjoy her life fully.  Suicidal/Homicidal:  No  Therapist Response: Reviewed symptoms and progress. Help patient process emotions and develop insight to coping strategies for stressors. Reinforced positive coping that has helped her to make progress including getting out to do things and addressing her physical issues. Utilize problem solving strategies in addressing stressors. Provided positive feedback to help continue progress and identifying that patient has stopped smoking marijuana, stopped smoking and is working on finding other alternatives to cope. Completed treatment plan with patient and identified supporting continued progress and treatment of anxiety, positive coping strategies and decrease of PTSD symptoms.  strategies to manage  Plan: Return again in 2 weeks.2. Patient continue to make progress in motivation and attitude as well as implementing positive coping strategies.3. Patient gain insight and implement strategies to manage anxiety.  Diagnosis: Axis I:  bipolar 2 disorder, most recent episode depressed, PTSD, generalized anxiety disorder    Axis II: No diagnosis    Bowman,Mary A, LCSW 09/01/2016

## 2016-09-02 ENCOUNTER — Ambulatory Visit (INDEPENDENT_AMBULATORY_CARE_PROVIDER_SITE_OTHER): Payer: Medicare Other | Admitting: Physical Therapy

## 2016-09-02 DIAGNOSIS — M25612 Stiffness of left shoulder, not elsewhere classified: Secondary | ICD-10-CM

## 2016-09-02 DIAGNOSIS — R293 Abnormal posture: Secondary | ICD-10-CM | POA: Diagnosis not present

## 2016-09-02 DIAGNOSIS — M6281 Muscle weakness (generalized): Secondary | ICD-10-CM

## 2016-09-02 DIAGNOSIS — M25512 Pain in left shoulder: Secondary | ICD-10-CM | POA: Diagnosis not present

## 2016-09-02 DIAGNOSIS — Z79899 Other long term (current) drug therapy: Secondary | ICD-10-CM | POA: Diagnosis not present

## 2016-09-02 NOTE — Therapy (Signed)
[place], [place], [address] Phone: [phone]   Fax:  [phone]  Physical Therapy Treatment  Patient Details  Name: Paige Lozano MRN: [id number] Date of Birth: 24-Jan-1966 Referring Provider: Dr Tania Ade  Encounter Date: 09/02/2016      PT End of Session - 09/02/16 1117    Visit Number 8   Number of Visits 18   Date for PT Re-Evaluation 09/22/16   PT Start Time 1103   PT Stop Time 1158   PT Time Calculation (min) 55 min   Activity Tolerance Patient tolerated treatment well      Past Medical History:  Diagnosis Date  . Anxiety   . Bipolar 2 disorder, major depressive episode ([place])   . Complication of anesthesia   . COPD (chronic obstructive pulmonary disease) ([place])    smoker  . Fibromyalgia   . Insomnia   . Multiple sclerosis ([place])   . PONV (postoperative nausea and vomiting)   . Pulmonary embolism ([place]) 2000  . Sleep apnea    mild OSA no CPAP  . Ulcerative colitis [facility])     Past Surgical History:  Procedure Laterality Date  . FOOT SURGERY Left    bone spur  . HEMORRHOID SURGERY    . HUMERUS FRACTURE SURGERY Left   . MYOMECTOMY    . NASAL SINUS SURGERY    . SHOULDER ARTHROSCOPY WITH SUBACROMIAL DECOMPRESSION AND BICEP TENDON REPAIR Left 07/27/2016   Procedure: SHOULDER ARTHROSCOPY DEBRIDEMENT ROTATOR CUFF AND LABRUM, SUBACROMIAL DECOMPRESSION, BICEPS TENODESIS;  Surgeon: Tania Ade, MD;  Location: [place];  Service: Orthopedics;  Laterality: Left;  SHOULDER ARTHROSCOPY DEBRIDEMENT ROTATOR CUFF AND LABRUM, SUBACROMIAL DECOMPRESSION, BICEPS TENOTOMY, POSSIBLE TENODESIS  . THUMB ARTHROSCOPY     5 surgeries on left thumb, 4 surgeries on right  . TONSILLECTOMY AND ADENOIDECTOMY      There were no vitals filed for this visit.      Subjective Assessment - 09/02/16 1111    Subjective Pt reports she continues to have pain in her lateral Lt shoulder.   She has rolled over onto Lt shoulder, can only tolerate laying on it for 10 min before untolerable.  She continues to wear sling at night and during waking hours.     Currently in Pain? Yes   Pain Score 3    Pain Location Shoulder   Pain Orientation Left            [facility] PT Assessment - 09/02/16 0001      Assessment   Medical Diagnosis Lt shoulder scope, biceps tenotomy & open tenodesis   Onset Date/Surgical Date 07/27/16   Hand Dominance Right   Next MD Visit 09/07/16          [facility] Adult PT Treatment/Exercise - 09/02/16 0001      Shoulder Exercises: Supine   Other Supine Exercises Protraction/retraction with Lt shoulder at 90 deg. x 10,  Lt shoulder flexion AROM x 5 reps, then hands behind head x 30 sec x 2 reps      Shoulder Exercises: Prone   Horizontal ABduction 1 Left;5 reps  2 sets   Other Prone Exercises Prone row with LUE x 10 reps.  scap retraction with shoulder ext to neutral x 10 reps      Shoulder Exercises: Sidelying   External Rotation AROM;Left;10 reps  2 sets   Flexion AROM;Left;10 reps     Shoulder Exercises: Standing   Other Standing Exercises wall ladder, LUE  flexion x 8 reps, scaption x 8 reps (up to #28) Pendulum x 20 reps     Shoulder Exercises: Pulleys   Flexion 2 minutes     Shoulder Exercises: Isometric Strengthening   Extension --  10 reps, 5 sec hold    ABduction --  10 reps, 5 sec hold      Electrical Stimulation   Electrical Stimulation Location Lt shoulder    Electrical Stimulation Action IFC   Electrical Stimulation Parameters to tolerance    Electrical Stimulation Goals Pain     Vasopneumatic   Number Minutes Vasopneumatic  15 minutes   Vasopnuematic Location  Shoulder   Vasopneumatic Pressure Low   Vasopneumatic Temperature  3*     Manual Therapy   Soft tissue mobilization TPR to Lt levator,  Rhomboid, upper and mid trap   Scapular Mobilization Lt scapula in all directions    Passive ROM Lt shoulder IR       Kinesiotix   Edema --  tape still in place           PT Long Term Goals - 08/13/16 1146      PT LONG TERM GOAL #1   Title I with advanced HEP to include water classes ( 09/22/16)    Time 6   Period Weeks   Status On-going     PT LONG TERM GOAL #2   Title demo Lt shoulder ROM WFL ( 09/22/16)    Time 6   Period Weeks   Status On-going     PT LONG TERM GOAL #3   Title tolerate initial Lt UE strengthening ( 09/22/16)    Time 6   Period Weeks   Status On-going     PT LONG TERM GOAL #4   Title improve FOTO =/< 44% limited ( 09/22/16)    Time 6   Period Weeks   Status On-going     PT LONG TERM GOAL #5   Title Walk and play with dogs without difficulty ( 09/22/16)    Time 6   Period Weeks   Status On-going               Plan - 09/02/16 1215    Rehab Potential Excellent   PT Frequency 3x / week   PT Duration 6 weeks   PT Treatment/Interventions Moist Heat;Ultrasound;Therapeutic exercise;Dry needling;Taping;Vasopneumatic Device;Manual techniques;Cryotherapy;Electrical Stimulation;Passive range of motion;Patient/family education   PT Next Visit Plan no resisted Lt biceps until 09/07/16, progress AAROM Lt shoulder - Continue with AROM; manual work Lt shoulder girdle - progress with posterior shoulder girdle stabilization       Patient will benefit from skilled therapeutic intervention in order to improve the following deficits and impairments:  Postural dysfunction, Increased edema, Decreased strength, Pain, Impaired UE functional use, Increased muscle spasms  Visit Diagnosis: Stiffness of left shoulder, not elsewhere classified  Acute pain of left shoulder  Abnormal posture  Muscle weakness (generalized)     Problem List Patient Active Problem List   Diagnosis Date Noted  . History of pulmonary embolus (PE) 07/21/2016  . Right elbow pain 07/21/2016  . Right lateral epicondylitis 07/21/2016  . GAD (generalized anxiety disorder) 07/20/2016  .  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 Anxiety state 06/01/2016  . Left shoulder pain 06/01/2016  . Multiple sclerosis ([place]) 05/29/2016  . Fibromyalgia 05/29/2016  . 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 Insomnia 05/29/2016  . Ulcerative pancolitis without complication ([place]) [id number]  . Bipolar 2 disorder ([place]) 05/29/2016  .  Current smoker 05/29/2016   Kerin Perna, PTA 09/02/16 12:15 PM  [place], [place], [address] Phone: [phone]   Fax:  [phone]  Name: Prithika Proctor MRN: [id number] Date of Birth: [birth year]

## 2016-09-04 ENCOUNTER — Ambulatory Visit (INDEPENDENT_AMBULATORY_CARE_PROVIDER_SITE_OTHER): Payer: Medicare Other | Admitting: Physical Therapy

## 2016-09-04 DIAGNOSIS — M25512 Pain in left shoulder: Secondary | ICD-10-CM

## 2016-09-04 DIAGNOSIS — R293 Abnormal posture: Secondary | ICD-10-CM

## 2016-09-04 DIAGNOSIS — M6281 Muscle weakness (generalized): Secondary | ICD-10-CM | POA: Diagnosis not present

## 2016-09-04 DIAGNOSIS — M25612 Stiffness of left shoulder, not elsewhere classified: Secondary | ICD-10-CM | POA: Diagnosis present

## 2016-09-04 NOTE — Therapy (Signed)
[facility]  [place], [place], [address] Phone: [phone]   Fax:  [phone]  Physical Therapy Treatment  Patient Details  Name: Paige Lozano MRN: [id number] Date of Birth: 01-Jan-1966 Referring Provider: Dr Tania Ade  Encounter Date: 09/04/2016      PT End of Session - 09/04/16 1114    Visit Number 9   Number of Visits 18   Date for PT Re-Evaluation 09/22/16   PT Start Time 1104   PT Stop Time 1201   PT Time Calculation (min) 57 min   Activity Tolerance Patient tolerated treatment well   Behavior During Therapy [facility] for tasks assessed/performed      Past Medical History:  Diagnosis Date  . Anxiety   . Bipolar 2 disorder, major depressive episode ([place])   . Complication of anesthesia   . COPD (chronic obstructive pulmonary disease) ([place])    smoker  . Fibromyalgia   . Insomnia   . Multiple sclerosis ([place])   . PONV (postoperative nausea and vomiting)   . Pulmonary embolism ([place]) 2000  . Sleep apnea    mild OSA no CPAP  . Ulcerative colitis [facility])     Past Surgical History:  Procedure Laterality Date  . FOOT SURGERY Left    bone spur  . HEMORRHOID SURGERY    . HUMERUS FRACTURE SURGERY Left   . MYOMECTOMY    . NASAL SINUS SURGERY    . SHOULDER ARTHROSCOPY WITH SUBACROMIAL DECOMPRESSION AND BICEP TENDON REPAIR Left 07/27/2016   Procedure: SHOULDER ARTHROSCOPY DEBRIDEMENT ROTATOR CUFF AND LABRUM, SUBACROMIAL DECOMPRESSION, BICEPS TENODESIS;  Surgeon: Tania Ade, MD;  Location: [place];  Service: Orthopedics;  Laterality: Left;  SHOULDER ARTHROSCOPY DEBRIDEMENT ROTATOR CUFF AND LABRUM, SUBACROMIAL DECOMPRESSION, BICEPS TENOTOMY, POSSIBLE TENODESIS  . THUMB ARTHROSCOPY     5 surgeries on left thumb, 4 surgeries on right  . TONSILLECTOMY AND ADENOIDECTOMY      There were no vitals filed for this visit.      Subjective Assessment - 09/04/16 1114    Subjective Pt  continues to complain of "irritating" pain in Lt bicep area when using arm.  "The lump in shoulder comes back when I'm using arm".  Neck is sore from weight of sling.     Patient Stated Goals return to swimming, water classes, walk dogs.    Currently in Pain? No/denies            [facility] PT Assessment - 09/04/16 0001      Assessment   Medical Diagnosis Lt shoulder scope, biceps tenotomy & open tenodesis   Onset Date/Surgical Date 07/27/16   Hand Dominance Right   Next MD Visit 09/07/16     AROM   Right/Left Shoulder Left  flex/abdct in standing;  ER in supine   Left Shoulder Extension 56 Degrees   Left Shoulder Flexion 170 Degrees   Left Shoulder ABduction 166 Degrees   Left Shoulder Internal Rotation --  ~T8 with arm behind back.    Left Shoulder External Rotation 91 Degrees  arm abducted 90 deg           [facility] Adult PT Treatment/Exercise - 09/04/16 0001      Shoulder Exercises: Prone   Extension Left;10 reps  to neutral   Horizontal ABduction 1 Left;10 reps  2 sets   Other Prone Exercises Prone row with LUE x 10 reps     Shoulder Exercises: Standing   Other Standing Exercises AROM - rings on hoop (  6) Rt to/from Lt - 90 deg flexion, repeated with 115 deg flexion with 12 rings.    Other Standing Exercises Wall ladder in scaption x 5 reps (up to #29);  pendulum x 10 reps between exercises.      Shoulder Exercises: Pulleys   Flexion 3 minutes   ABduction 2 minutes     Shoulder Exercises: Therapy Ball   Other Therapy Ball Exercises shoulder flexion of 90 deg with circles CW/CCW x 20 reps     Shoulder Exercises: Stretch   Other Shoulder Stretches Standing Lt shoulder ext  stretch x 30 sec x 2 reps, then ER x 20 sec x 2 reps      Electrical Stimulation   Electrical Stimulation Location Lt shoulder    Electrical Stimulation Action IFC   Electrical Stimulation Parameters to tolerance    Electrical Stimulation Goals Pain     Vasopneumatic   Number Minutes  Vasopneumatic  15 minutes   Vasopnuematic Location  Shoulder   Vasopneumatic Pressure Low   Vasopneumatic Temperature  3*     Manual Therapy   Myofascial Release to Lt pec, levator, rhomboid, upper trap    Scapular Mobilization Lt scapula in all directions    Kinesiotex --  applied to Lt pec incision for scar management.                      PT Long Term Goals - 09/04/16 1153      PT LONG TERM GOAL #1   Title I with advanced HEP to include water classes ( 09/22/16)    Time 6   Period Weeks   Status On-going     PT LONG TERM GOAL #2   Title demo Lt shoulder ROM WFL ( 09/22/16)    Time 6   Period Weeks   Status Achieved     PT LONG TERM GOAL #3   Title tolerate initial Lt UE strengthening ( 09/22/16)    Time 6   Period Weeks   Status On-going     PT LONG TERM GOAL #4   Title improve FOTO =/< 44% limited ( 09/22/16)    Time 6   Period Weeks   Status Achieved.      PT LONG TERM GOAL #5   Title Walk and play with dogs without difficulty ( 09/22/16)    Time 6   Period Weeks   Status On-going               Plan - 09/04/16 1153    Clinical Impression Statement Pt demonstrated improved Lt shoulder AROM; has met LTG#2 and FOTO goal, LTG # 4.  She has tolerated all ROM exercises and scapular strengthening exercises with minimal increase in pain.     Rehab Potential Excellent   PT Frequency 3x / week   PT Duration 6 weeks   PT Treatment/Interventions Moist Heat;Ultrasound;Therapeutic exercise;Dry needling;Taping;Vasopneumatic Device;Manual techniques;Cryotherapy;Electrical Stimulation;Passive range of motion;Patient/family education   PT Next Visit Plan Await instructions from MD regarding strengthening and restrictions.     Consulted and Agree with Plan of Care Patient      Patient will benefit from skilled therapeutic intervention in order to improve the following deficits and impairments:  Postural dysfunction, Increased edema, Decreased strength,  Pain, Impaired UE functional use, Increased muscle spasms  Visit Diagnosis: Stiffness of left shoulder, not elsewhere classified  Acute pain of left shoulder  Abnormal posture  Muscle weakness (generalized)     Problem List Patient Active  Problem List   Diagnosis Date Noted  . History of pulmonary embolus (PE) 07/21/2016  . Right elbow pain 07/21/2016  . Right lateral epicondylitis 07/21/2016  . GAD (generalized anxiety disorder) 07/20/2016  . 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 Anxiety state 06/01/2016  . Left shoulder pain 06/01/2016  . Multiple sclerosis ([place]) 05/29/2016  . Fibromyalgia 05/29/2016  . 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 Insomnia 05/29/2016  . Ulcerative pancolitis without complication ([place]) 46/52/0761  . Bipolar 2 disorder ([place]) 05/29/2016  . Current smoker 05/29/2016   Kerin Perna, PTA 09/04/16 1:27 PM  [facility] [place], [place], [address] Phone: [phone]   Fax:  [phone]  Name: Paige Lozano MRN: [id number] Date of Birth: 01-29-1966  Jeral Pinch, PT 09/04/16 1:27 PM

## 2016-09-07 ENCOUNTER — Encounter: Payer: Self-pay | Admitting: Rehabilitative and Restorative Service Providers"

## 2016-09-07 ENCOUNTER — Ambulatory Visit (INDEPENDENT_AMBULATORY_CARE_PROVIDER_SITE_OTHER): Payer: Medicare Other | Admitting: Rehabilitative and Restorative Service Providers"

## 2016-09-07 DIAGNOSIS — M25512 Pain in left shoulder: Secondary | ICD-10-CM | POA: Diagnosis not present

## 2016-09-07 DIAGNOSIS — M6281 Muscle weakness (generalized): Secondary | ICD-10-CM | POA: Diagnosis not present

## 2016-09-07 DIAGNOSIS — Z9889 Other specified postprocedural states: Secondary | ICD-10-CM | POA: Diagnosis not present

## 2016-09-07 DIAGNOSIS — M25612 Stiffness of left shoulder, not elsewhere classified: Secondary | ICD-10-CM

## 2016-09-07 DIAGNOSIS — R293 Abnormal posture: Secondary | ICD-10-CM

## 2016-09-07 NOTE — Patient Instructions (Signed)

## 2016-09-07 NOTE — Therapy (Signed)
[place], [place], [address] Phone: [phone]   Fax:  [phone]  Physical Therapy Treatment  Patient Details  Name: Paige Lozano MRN: [id number] Date of Birth: 1966-10-18 Referring Provider: Dr Tania Ade  Encounter Date: 09/07/2016      PT End of Session - 09/07/16 1154    Visit Number 10   Number of Visits 18   Date for PT Re-Evaluation 09/22/16   PT Start Time [id number]   PT Stop Time 1245   PT Time Calculation (min) 57 min   Activity Tolerance Patient tolerated treatment well      Past Medical History:  Diagnosis Date  . Anxiety   . Bipolar 2 disorder, major depressive episode ([place])   . Complication of anesthesia   . COPD (chronic obstructive pulmonary disease) ([place])    smoker  . Fibromyalgia   . Insomnia   . Multiple sclerosis ([place])   . PONV (postoperative nausea and vomiting)   . Pulmonary embolism ([place]) 2000  . Sleep apnea    mild OSA no CPAP  . Ulcerative colitis [facility])     Past Surgical History:  Procedure Laterality Date  . FOOT SURGERY Left    bone spur  . HEMORRHOID SURGERY    . HUMERUS FRACTURE SURGERY Left   . MYOMECTOMY    . NASAL SINUS SURGERY    . SHOULDER ARTHROSCOPY WITH SUBACROMIAL DECOMPRESSION AND BICEP TENDON REPAIR Left 07/27/2016   Procedure: SHOULDER ARTHROSCOPY DEBRIDEMENT ROTATOR CUFF AND LABRUM, SUBACROMIAL DECOMPRESSION, BICEPS TENODESIS;  Surgeon: Tania Ade, MD;  Location: [place];  Service: Orthopedics;  Laterality: Left;  SHOULDER ARTHROSCOPY DEBRIDEMENT ROTATOR CUFF AND LABRUM, SUBACROMIAL DECOMPRESSION, BICEPS TENOTOMY, POSSIBLE TENODESIS  . THUMB ARTHROSCOPY     5 surgeries on left thumb, 4 surgeries on right  . TONSILLECTOMY AND ADENOIDECTOMY      There were no vitals filed for this visit.      Subjective Assessment - 09/07/16 1155    Subjective (P)  Seen by MD this am. MD thinks that the "lump" may be the  deltoid. He felt that the TDN may help that area. She will "keep an eye on it" and call to come in and see him again.    Currently in Pain? (P)  Yes   Pain Score (P)  8    Pain Location (P)  Shoulder   Pain Orientation (P)  Left   Pain Descriptors / Indicators (P)  Sore                         [facility] Adult PT Treatment/Exercise - 09/07/16 0001      Shoulder Exercises: Standing   Retraction AROM;Both;5 reps     Shoulder Exercises: Pulleys   Flexion --  10 sec hold x 10 reps    ABduction --  10 sec hold x 10      Moist Heat Therapy   Number Minutes Moist Heat 15 Minutes   Moist Heat Location Shoulder;Cervical  Rt (+) thoracic spine      Electrical Stimulation   Electrical Stimulation Location Lt shoulder    Electrical Stimulation Action IFC   Electrical Stimulation Parameters to tolerance   Electrical Stimulation Goals Pain;Tone     Manual Therapy   Manual therapy comments pt supine and prone   Soft tissue mobilization pecs; upper trap; leveator; rhomboids; supraspinitus; infraspinitus; teres; deltoid; biceps (min tolerance for STM through ant deltoid and  biceps    Myofascial Release Lt pec/anterior chest; Lt upper trap/leveator   Scapular Mobilization Lt scapula in all directions    Kinesiotex --  applied to Lt pec incision for scar management.           Trigger Point Dry Needling - 09/07/16 1248    Consent Given? Yes   Education Handout Provided Yes   Muscles Treated Upper Body Upper trapezius;Pectoralis major;Pectoralis minor;Levator scapulae;Rhomboids;Supraspinatus;Infraspinatus   Upper Trapezius Response Palpable increased muscle length   Pectoralis Major Response Palpable increased muscle length   Pectoralis Minor Response Palpable increased muscle length   Levator Scapulae Response Palpable increased muscle length   Rhomboids Response Palpable increased muscle length   Supraspinatus Response Palpable increased muscle length   Infraspinatus  Response Palpable increased muscle length              PT Education - 09/07/16 1249    Education provided Yes   Education Details TDN   Person(s) Educated Patient   Methods Explanation   Comprehension Verbalized understanding             PT Long Term Goals - 09/04/16 1153      PT LONG TERM GOAL #1   Title I with advanced HEP to include water classes ( 09/22/16)    Time 6   Period Weeks   Status On-going     PT LONG TERM GOAL #2   Title demo Lt shoulder ROM WFL ( 09/22/16)    Time 6   Period Weeks   Status Achieved     PT LONG TERM GOAL #3   Title tolerate initial Lt UE strengthening ( 09/22/16)    Time 6   Period Weeks   Status On-going     PT LONG TERM GOAL #4   Title improve FOTO =/< 44% limited ( 09/22/16)    Time 6   Period Weeks   Status On-going     PT LONG TERM GOAL #5   Title Walk and play with dogs without difficulty ( 09/22/16)    Time 6   Period Weeks   Status On-going               Plan - 09/07/16 1249    Clinical Impression Statement MD pleased with progress. Patient will moniter the anterior deltoid/biceps area and contact MD is she has continued pain in the area. Dense tightness noted through this area with patient not willing to try the TDN in this area today - very sensitive to light palpation in this area. Tolerated other areas for TDN well with palpable decrease in tightness following TDN and manual work.    Rehab Potential Excellent   PT Frequency 2x / week   PT Duration 6 weeks   PT Treatment/Interventions Moist Heat;Ultrasound;Therapeutic exercise;Dry needling;Taping;Vasopneumatic Device;Manual techniques;Cryotherapy;Electrical Stimulation;Passive range of motion;Patient/family education   PT Next Visit Plan Assess response to TDN and continue shoulder rehab as ordered   Consulted and Agree with Plan of Care Patient      Patient will benefit from skilled therapeutic intervention in order to improve the following  deficits and impairments:  Postural dysfunction, Increased edema, Decreased strength, Pain, Impaired UE functional use, Increased muscle spasms  Visit Diagnosis: Stiffness of left shoulder, not elsewhere classified  Acute pain of left shoulder  Abnormal posture  Muscle weakness (generalized)     Problem List Patient Active Problem List   Diagnosis Date Noted  . History of pulmonary embolus (PE) 07/21/2016  . Right elbow  pain 07/21/2016  . Right lateral epicondylitis 07/21/2016  . GAD (generalized anxiety disorder) 07/20/2016  . 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 Anxiety state 06/01/2016  . Left shoulder pain 06/01/2016  . Multiple sclerosis ([place]) 05/29/2016  . Fibromyalgia 05/29/2016  . 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 Insomnia 05/29/2016  . Ulcerative pancolitis without complication ([place]) [id number]  . Bipolar 2 disorder ([place]) 05/29/2016  . Current smoker 05/29/2016    Jayleen Afonso Nilda Simmer PT, MPH  09/07/2016, 12:54 PM  [facility] [place], [place], [address] Phone: [phone]   Fax:  [phone]  Name: Jaryn Caughell MRN: [id number] Date of Birth: May 19, 1966

## 2016-09-08 ENCOUNTER — Ambulatory Visit (HOSPITAL_COMMUNITY): Payer: Self-pay | Admitting: Licensed Clinical Social Worker

## 2016-09-08 DIAGNOSIS — G35 Multiple sclerosis: Secondary | ICD-10-CM | POA: Diagnosis not present

## 2016-09-09 ENCOUNTER — Ambulatory Visit (INDEPENDENT_AMBULATORY_CARE_PROVIDER_SITE_OTHER): Payer: Medicare Other | Admitting: Physical Therapy

## 2016-09-09 DIAGNOSIS — M6281 Muscle weakness (generalized): Secondary | ICD-10-CM | POA: Diagnosis not present

## 2016-09-09 DIAGNOSIS — M25612 Stiffness of left shoulder, not elsewhere classified: Secondary | ICD-10-CM | POA: Diagnosis present

## 2016-09-09 DIAGNOSIS — M25512 Pain in left shoulder: Secondary | ICD-10-CM | POA: Diagnosis not present

## 2016-09-09 DIAGNOSIS — R293 Abnormal posture: Secondary | ICD-10-CM | POA: Diagnosis not present

## 2016-09-09 NOTE — Therapy (Signed)
[place], [place], [address] Phone: [phone]   Fax:  [phone]  Physical Therapy Treatment  Patient Details  Name: Paige Lozano MRN: [id number] Date of Birth: 02-20-1966 Referring Provider: Dr Tania Ade  Encounter Date: 09/09/2016      PT End of Session - 09/09/16 1208    Visit Number 11   Number of Visits 18   Date for PT Re-Evaluation 09/22/16   PT Start Time 1104   PT Stop Time 1151   PT Time Calculation (min) 47 min   Activity Tolerance Patient tolerated treatment well;No increased pain   Behavior During Therapy WFL for tasks assessed/performed      Past Medical History:  Diagnosis Date  . Anxiety   . Bipolar 2 disorder, major depressive episode ([place])   . Complication of anesthesia   . COPD (chronic obstructive pulmonary disease) ([place])    smoker  . Fibromyalgia   . Insomnia   . Multiple sclerosis ([place])   . PONV (postoperative nausea and vomiting)   . Pulmonary embolism ([place]) 2000  . Sleep apnea    mild OSA no CPAP  . Ulcerative colitis [facility]     Past Surgical History:  Procedure Laterality Date  . FOOT SURGERY Left    bone spur  . HEMORRHOID SURGERY    . HUMERUS FRACTURE SURGERY Left   . MYOMECTOMY    . NASAL SINUS SURGERY    . SHOULDER ARTHROSCOPY WITH SUBACROMIAL DECOMPRESSION AND BICEP TENDON REPAIR Left 07/27/2016   Procedure: SHOULDER ARTHROSCOPY DEBRIDEMENT ROTATOR CUFF AND LABRUM, SUBACROMIAL DECOMPRESSION, BICEPS TENODESIS;  Surgeon: Tania Ade, MD;  Location: [place];  Service: Orthopedics;  Laterality: Left;  SHOULDER ARTHROSCOPY DEBRIDEMENT ROTATOR CUFF AND LABRUM, SUBACROMIAL DECOMPRESSION, BICEPS TENOTOMY, POSSIBLE TENODESIS  . THUMB ARTHROSCOPY     5 surgeries on left thumb, 4 surgeries on right  . TONSILLECTOMY AND ADENOIDECTOMY      There were no vitals filed for this visit.      Subjective Assessment - 09/09/16 1108     Subjective This is the second day without sling. Dry needling was sore that day but doing much better.   Pain Score 1    Pain Location Shoulder   Pain Orientation Left   Pain Descriptors / Indicators Tightness   Pain Frequency Intermittent   Aggravating Factors  worse by end of the day   Pain Relieving Factors rest, heat            [facility] PT Assessment - 09/09/16 0001      AROM   Right/Left Shoulder --                     [facility] Adult PT Treatment/Exercise - 09/09/16 0001      Elbow Exercises   Elbow Flexion Strengthening;Left;10 reps   Theraband Level (Elbow Flexion) Level 1 (Yellow)     Lumbar Exercises: Aerobic   UBE (Upper Arm Bike) L2 X5 min     Shoulder Exercises: Standing   External Rotation Strengthening;Left;10 reps   Theraband Level (Shoulder External Rotation) --  isometric mod resist   Internal Rotation Strengthening;Left;10 reps   Theraband Level (Shoulder Internal Rotation) --  isometric mod resist   Flexion Strengthening;Left;10 reps   Theraband Level (Shoulder Flexion) --  isometric flexion mod resist   Extension Strengthening;Left;10 reps   Theraband Level (Shoulder Extension) --  isometric mod resist     Shoulder Exercises: ROM/Strengthening   Other  ROM/Strengthening Exercises PROM Lt shoulder external rotation, internal rotation, flexion.      Moist Heat Therapy   Number Minutes Moist Heat 15 Minutes  Lt shoulder (not included in tx time)                PT Education - 09/09/16 1153    Education provided Yes   Education Details HEP   Person(s) Educated Patient   Methods Explanation;Demonstration;Handout;Verbal cues;Tactile cues   Comprehension Verbalized understanding;Returned demonstration             PT Long Term Goals - 09/04/16 1153      PT LONG TERM GOAL #1   Title I with advanced HEP to include water classes ( 09/22/16)    Time 6   Period Weeks   Status On-going     PT LONG TERM GOAL #2   Title  demo Lt shoulder ROM WFL ( 09/22/16)    Time 6   Period Weeks   Status Achieved     PT LONG TERM GOAL #3   Title tolerate initial Lt UE strengthening ( 09/22/16)    Time 6   Period Weeks   Status On-going     PT LONG TERM GOAL #4   Title improve FOTO =/< 44% limited ( 09/22/16)    Time 6   Period Weeks   Status On-going     PT LONG TERM GOAL #5   Title Walk and play with dogs without difficulty ( 09/22/16)    Time 6   Period Weeks   Status On-going               Plan - 09/09/16 1209    Clinical Impression Statement Progressing to strengthenging focus with initiation of submaximal isometrics (mod resistance) for Lt shoulder and T-band for biceps. Pt able to perform exercises without complaint, question, or concern. Pt remains sensitive through Lt deltoid region. Will continue to progress strengthening as tolerated.    Rehab Potential Excellent   PT Frequency 2x / week   PT Duration 6 weeks   PT Treatment/Interventions Moist Heat;Ultrasound;Therapeutic exercise;Dry needling;Taping;Vasopneumatic Device;Manual techniques;Cryotherapy;Electrical Stimulation;Passive range of motion;Patient/family education   PT Next Visit Plan Progress strengthening program in clinic and HEP as tolerated. Taping as needed.    Consulted and Agree with Plan of Care Patient      Patient will benefit from skilled therapeutic intervention in order to improve the following deficits and impairments:  Postural dysfunction, Increased edema, Decreased strength, Pain, Impaired UE functional use, Increased muscle spasms  Visit Diagnosis: Stiffness of left shoulder, not elsewhere classified  Acute pain of left shoulder  Muscle weakness (generalized)  Abnormal posture     Problem List Patient Active Problem List   Diagnosis Date Noted  . History of pulmonary embolus (PE) 07/21/2016  . Right elbow pain 07/21/2016  . Right lateral epicondylitis 07/21/2016  . GAD (generalized anxiety disorder)  07/20/2016  . 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 Anxiety state 06/01/2016  . Left shoulder pain 06/01/2016  . Multiple sclerosis ([place]) 05/29/2016  . Fibromyalgia 05/29/2016  . 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 Insomnia 05/29/2016  . Ulcerative pancolitis without complication ([place]) [id number]  . Bipolar 2 disorder ([place]) 05/29/2016  . Current smoker 05/29/2016    Linard Millers, PT, CSCS 09/09/2016, 12:15 PM  [facility] [place], [place], [address] Phone: [phone]   Fax:  [phone]  Name: Xandra Verhalen MRN: [id number] Date of Birth: [birth year]

## 2016-09-09 NOTE — Patient Instructions (Addendum)
Strengthening: Isometric Internal Rotation    Using door frame for resistance, press palm of right hand into ball using light pressure. Keep elbow in at side. Hold __5__ seconds. Repeat __10__ times per set. Do _1__ sets per session. Do _2__ sessions per day.  http://orth.exer.us/816   Copyright  VHI. All rights reserved.  Strengthening: Isometric External Rotation    Using wall to provide resistance, and keeping right arm at side, press back of hand into ball using light pressure. Hold __5__ seconds. Repeat _10___ times per set. Do _1___ sets per session. Do __2__ sessions per day.  http://orth.exer.us/814   Copyright  VHI. All rights reserved.  Strengthening: Isometric Extension    Using wall for resistance, press back of left arm into ball using light pressure. Hold _5___ seconds. Repeat __10__ times per set. Do _1___ sets per session. Do __2__ sessions per day.  http://orth.exer.us/804   Copyright  VHI. All rights reserved.  Strengthening: Isometric Flexion    Using wall for resistance, press right fist into ball using light pressure. Hold __5__ seconds. Repeat _10___ times per set. Do __1__ sets per session. Do __2__ sessions per day.  http://orth.exer.us/800   Copyright  VHI. All rights reserved.  Elbow Flexion: Resisted    With tubing wrapped around left fist and other end secured under foot, curl arm up as far as possible. Repeat _10___ times per set. Do __1__ sets per session. Do __2__ sessions per day.  Copyright  VHI. All rights reserved.

## 2016-09-11 ENCOUNTER — Encounter: Payer: Self-pay | Admitting: Physical Therapy

## 2016-09-11 ENCOUNTER — Ambulatory Visit (INDEPENDENT_AMBULATORY_CARE_PROVIDER_SITE_OTHER): Payer: Medicare Other | Admitting: Physical Therapy

## 2016-09-11 DIAGNOSIS — M25612 Stiffness of left shoulder, not elsewhere classified: Secondary | ICD-10-CM

## 2016-09-11 DIAGNOSIS — R293 Abnormal posture: Secondary | ICD-10-CM

## 2016-09-11 DIAGNOSIS — M6281 Muscle weakness (generalized): Secondary | ICD-10-CM | POA: Diagnosis not present

## 2016-09-11 NOTE — Therapy (Signed)
[place], [place], [address] Phone: [phone]   Fax:  [phone]  Physical Therapy Treatment  Patient Details  Name: Paige Lozano MRN: [id number] Date of Birth: [birth year] Referring Provider: Dr Tania Ade  Encounter Date: 09/11/2016      PT End of Session - 09/11/16 1245    Visit Number 12   Number of Visits 18   Date for PT Re-Evaluation 09/22/16   PT Start Time 1146   PT Stop Time 1233   PT Time Calculation (min) 47 min   Activity Tolerance Patient tolerated treatment well;No increased pain   Behavior During Therapy WFL for tasks assessed/performed      Past Medical History:  Diagnosis Date  . Anxiety   . Bipolar 2 disorder, major depressive episode ([place])   . Complication of anesthesia   . COPD (chronic obstructive pulmonary disease) ([place])    smoker  . Fibromyalgia   . Insomnia   . Multiple sclerosis ([place])   . PONV (postoperative nausea and vomiting)   . Pulmonary embolism ([place]) 2000  . Sleep apnea    mild OSA no CPAP  . Ulcerative colitis [facility])     Past Surgical History:  Procedure Laterality Date  . FOOT SURGERY Left    bone spur  . HEMORRHOID SURGERY    . HUMERUS FRACTURE SURGERY Left   . MYOMECTOMY    . NASAL SINUS SURGERY    . SHOULDER ARTHROSCOPY WITH SUBACROMIAL DECOMPRESSION AND BICEP TENDON REPAIR Left 07/27/2016   Procedure: SHOULDER ARTHROSCOPY DEBRIDEMENT ROTATOR CUFF AND LABRUM, SUBACROMIAL DECOMPRESSION, BICEPS TENODESIS;  Surgeon: Tania Ade, MD;  Location: [place];  Service: Orthopedics;  Laterality: Left;  SHOULDER ARTHROSCOPY DEBRIDEMENT ROTATOR CUFF AND LABRUM, SUBACROMIAL DECOMPRESSION, BICEPS TENOTOMY, POSSIBLE TENODESIS  . THUMB ARTHROSCOPY     5 surgeries on left thumb, 4 surgeries on right  . TONSILLECTOMY AND ADENOIDECTOMY      There were no vitals filed for this visit.      Subjective Assessment - 09/11/16 1151     Subjective Shoulder is doing pretty good, muscle sore but nothing sharp.    Currently in Pain? Yes   Pain Score 2    Pain Location Shoulder   Pain Orientation Left   Pain Descriptors / Indicators Tightness                         [facility] Adult PT Treatment/Exercise - 09/11/16 0001      Lumbar Exercises: Aerobic   UBE (Upper Arm Bike) L2 X5 min     Shoulder Exercises: Standing   External Rotation Strengthening;Left;10 reps   Theraband Level (Shoulder External Rotation) Level 1 (Yellow)   Internal Rotation Strengthening;Left;10 reps   Theraband Level (Shoulder Internal Rotation) Level 1 (Yellow)   Extension Strengthening;10 reps;Both   Theraband Level (Shoulder Extension) Level 2 (Red)  with scapular retraction   Row Strengthening;Both;10 reps   Theraband Level (Shoulder Row) Level 2 (Red)   Other Standing Exercises supine shoulder at 90 degrees rhythmic stabilization with increasing speed.   Other Standing Exercises isometric IR, ER, extension - each 1X10 Lt with mod resist     Shoulder Exercises: ROM/Strengthening   Other ROM/Strengthening Exercises PROM Lt shoulder external rotation, internal rotation, flexion.      Manual Therapy   Manual Therapy Taping  scar mobilization   Kinesiotex Inhibit Muscle     Kinesiotix   Inhibit Muscle  I strip  at [place] and levator                 PT Education - 09/11/16 1245    Education provided Yes   Education Details HEP   Person(s) Educated Patient   Methods Explanation;Demonstration;Tactile cues;Verbal cues;Handout   Comprehension Verbalized understanding;Returned demonstration             PT Long Term Goals - 09/04/16 1153      PT LONG TERM GOAL #1   Title I with advanced HEP to include water classes ( 09/22/16)    Time 6   Period Weeks   Status On-going     PT LONG TERM GOAL #2   Title demo Lt shoulder ROM WFL ( 09/22/16)    Time 6   Period Weeks   Status Achieved     PT LONG TERM GOAL #3    Title tolerate initial Lt UE strengthening ( 09/22/16)    Time 6   Period Weeks   Status On-going     PT LONG TERM GOAL #4   Title improve FOTO =/< 44% limited ( 09/22/16)    Time 6   Period Weeks   Status On-going     PT LONG TERM GOAL #5   Title Walk and play with dogs without difficulty ( 09/22/16)    Time 6   Period Weeks   Status On-going               Plan - 09/11/16 1246    Clinical Impression Statement Able to continue to progress strengthening through Lt UE complex. Pt able to perform exercise progression without increase pain. Multimodal cues for technique. PT to continue to work on [place] stabilization for functional use. Pt denies any questions or concerns following session.    Rehab Potential Excellent   PT Frequency 2x / week   PT Duration 6 weeks   PT Treatment/Interventions Moist Heat;Ultrasound;Therapeutic exercise;Dry needling;Taping;Vasopneumatic Device;Manual techniques;Cryotherapy;Electrical Stimulation;Passive range of motion;Patient/family education   PT Next Visit Plan Check HEP, progress as needed. Check tape and if continuing to need.    Consulted and Agree with Plan of Care Patient      Patient will benefit from skilled therapeutic intervention in order to improve the following deficits and impairments:  Postural dysfunction, Increased edema, Decreased strength, Pain, Impaired UE functional use, Increased muscle spasms  Visit Diagnosis: Stiffness of left shoulder, not elsewhere classified  Muscle weakness (generalized)  Abnormal posture     Problem List Patient Active Problem List   Diagnosis Date Noted  . History of pulmonary embolus (PE) 07/21/2016  . Right elbow pain 07/21/2016  . Right lateral epicondylitis 07/21/2016  . GAD (generalized anxiety disorder) 07/20/2016  . 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 Anxiety state 06/01/2016  . Left shoulder pain 06/01/2016  . Multiple sclerosis ([place]) 05/29/2016  .  Fibromyalgia 05/29/2016  . 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 Insomnia 05/29/2016  . Ulcerative pancolitis without complication ([place]) [id number]  . Bipolar 2 disorder ([place]) 05/29/2016  . Current smoker 05/29/2016    Linard Millers, PT, CSCS 09/11/2016, 12:50 PM  [facility] [place], [place], [address] Phone: [phone]   Fax:  [phone]  Name: Samreen Debartolo MRN: [id number] Date of Birth: 09-19-1966

## 2016-09-16 ENCOUNTER — Encounter: Payer: Self-pay | Admitting: Physical Therapy

## 2016-09-16 ENCOUNTER — Ambulatory Visit (INDEPENDENT_AMBULATORY_CARE_PROVIDER_SITE_OTHER): Payer: Medicare Other | Admitting: Physical Therapy

## 2016-09-16 DIAGNOSIS — M6281 Muscle weakness (generalized): Secondary | ICD-10-CM

## 2016-09-16 DIAGNOSIS — R293 Abnormal posture: Secondary | ICD-10-CM

## 2016-09-16 DIAGNOSIS — M25612 Stiffness of left shoulder, not elsewhere classified: Secondary | ICD-10-CM

## 2016-09-16 DIAGNOSIS — M25512 Pain in left shoulder: Secondary | ICD-10-CM | POA: Diagnosis not present

## 2016-09-16 NOTE — Therapy (Signed)
[place], [place], [address] Phone: [phone]   Fax:  [phone]  Physical Therapy Treatment  Patient Details  Name: Paige Lozano MRN: [id number] Date of Birth: 1966/01/03 Referring Provider: Dr Tania Ade  Encounter Date: 09/16/2016      PT End of Session - 09/16/16 1450    Visit Number 13   Number of Visits 18   Date for PT Re-Evaluation 09/22/16   Authorization Time Period G-code at visit 20   PT Start Time 1403   PT Stop Time 1439   PT Time Calculation (min) 36 min   Activity Tolerance Patient tolerated treatment well;Patient limited by pain   Behavior During Therapy [facility] for tasks assessed/performed      Past Medical History:  Diagnosis Date  . Anxiety   . Bipolar 2 disorder, major depressive episode ([place])   . Complication of anesthesia   . COPD (chronic obstructive pulmonary disease) ([place])    smoker  . Fibromyalgia   . Insomnia   . Multiple sclerosis ([place])   . PONV (postoperative nausea and vomiting)   . Pulmonary embolism ([place]) 2000  . Sleep apnea    mild OSA no CPAP  . Ulcerative colitis [facility])     Past Surgical History:  Procedure Laterality Date  . FOOT SURGERY Left    bone spur  . HEMORRHOID SURGERY    . HUMERUS FRACTURE SURGERY Left   . MYOMECTOMY    . NASAL SINUS SURGERY    . SHOULDER ARTHROSCOPY WITH SUBACROMIAL DECOMPRESSION AND BICEP TENDON REPAIR Left 07/27/2016   Procedure: SHOULDER ARTHROSCOPY DEBRIDEMENT ROTATOR CUFF AND LABRUM, SUBACROMIAL DECOMPRESSION, BICEPS TENODESIS;  Surgeon: Tania Ade, MD;  Location: [place];  Service: Orthopedics;  Laterality: Left;  SHOULDER ARTHROSCOPY DEBRIDEMENT ROTATOR CUFF AND LABRUM, SUBACROMIAL DECOMPRESSION, BICEPS TENOTOMY, POSSIBLE TENODESIS  . THUMB ARTHROSCOPY     5 surgeries on left thumb, 4 surgeries on right  . TONSILLECTOMY AND ADENOIDECTOMY      There were no vitals filed for this  visit.      Subjective Assessment - 09/16/16 1405    Subjective Shoulder is more sore, ran in the wall at work and hit her Lt shoulder. Been trying to take it a little easy since then.    Pain Score 4    Pain Location Shoulder   Pain Orientation Left   Pain Descriptors / Indicators Throbbing   Pain Frequency Intermittent   Aggravating Factors  using arm   Pain Relieving Factors resting                         [facility] Adult PT Treatment/Exercise - 09/16/16 0001      Lumbar Exercises: Aerobic   UBE (Upper Arm Bike) L2 X5 min     Shoulder Exercises: ROM/Strengthening   Other ROM/Strengthening Exercises PROM; flexion with inferior glide grade 2   Other ROM/Strengthening Exercises PROM; external/internal rotation, abduction - as tolerated.     Shoulder Exercises: Isometric Strengthening   Flexion Limitations 1X10, mod resist to pt comfort   Extension Limitations 1X10, mod resist to pt comfort   External Rotation Limitations 1X10, mod resist to pt comfort   Internal Rotation Limitations 1X10, mod resist to pt comfort     Modalities   Modalities Cryotherapy     Cryotherapy   Number Minutes Cryotherapy 10 Minutes   Cryotherapy Location Shoulder   Type of Cryotherapy Ice pack  Manual Therapy   Manual Therapy Joint mobilization   Manual therapy comments grade 2-3 inferior glide [facility])   Joint Mobilization pt ecucated on self inferior GH glide in sitting.                 PT Education - 09/16/16 1450    Education provided Yes   Education Details Return to gentle ROM/ice/isometrics    Person(s) Educated Patient   Methods Explanation   Comprehension Verbalized understanding             PT Long Term Goals - 09/16/16 1458      PT LONG TERM GOAL #1   Title I with advanced HEP to include water classes ( 09/22/16)    Time 6   Period Weeks   Status On-going     PT LONG TERM GOAL #2   Title demo Lt shoulder ROM WFL ( 09/22/16)    Time 6    Period Weeks   Status Achieved     PT LONG TERM GOAL #3   Title tolerate initial Lt UE strengthening ( 09/22/16)    Time 6   Period Weeks   Status On-going     PT LONG TERM GOAL #4   Title improve FOTO =/< 44% limited ( 09/22/16)    Time 6   Period Weeks   Status On-going     PT LONG TERM GOAL #5   Title Walk and play with dogs without difficulty ( 09/22/16)    Time 6   Period Weeks   Status On-going               Plan - 09/16/16 1451    Clinical Impression Statement Pt presenting with increased Lt shoulder pain due to running into a wall at work. Increased pain noted with prior exercises, modifed to gentle isometics, ROM, and self inferior glides. Will attempt to progress back to prior strengthening at next session if able. Pt reports decreased pain with inferior glides.    Rehab Potential Excellent   PT Frequency 2x / week   PT Duration 6 weeks   PT Treatment/Interventions Moist Heat;Ultrasound;Therapeutic exercise;Dry needling;Taping;Vasopneumatic Device;Manual techniques;Cryotherapy;Electrical Stimulation;Passive range of motion;Patient/family education   PT Next Visit Plan Check pain level, progress back to bands and stabolization exercises if able to tolerated.    Consulted and Agree with Plan of Care Patient      Patient will benefit from skilled therapeutic intervention in order to improve the following deficits and impairments:  Postural dysfunction, Increased edema, Decreased strength, Pain, Impaired UE functional use, Increased muscle spasms  Visit Diagnosis: Stiffness of left shoulder, not elsewhere classified  Muscle weakness (generalized)  Abnormal posture  Acute pain of left shoulder     Problem List Patient Active Problem List   Diagnosis Date Noted  . History of pulmonary embolus (PE) 07/21/2016  . Right elbow pain 07/21/2016  . Right lateral epicondylitis 07/21/2016  . GAD (generalized anxiety disorder) 07/20/2016  . Chondromalacia of  right patellofemoral joint 06/01/2016  . Anxiety state 06/01/2016  . Left shoulder pain 06/01/2016  . Multiple sclerosis ([place]) 05/29/2016  . Fibromyalgia 05/29/2016  . 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 Insomnia 05/29/2016  . Ulcerative pancolitis without complication ([place]) [id number]  . Bipolar 2 disorder ([place]) 05/29/2016  . Current smoker 05/29/2016    Linard Millers, PT, CSCS 09/16/2016, 3:02 PM  [facility] [place], [place], [address] Phone: [phone]   Fax:  [phone]  Name: Paige Lozano MRN: [id number] Date of Birth: 03/15/1966

## 2016-09-16 NOTE — Patient Instructions (Signed)
Seated inferior glenohumeral glide - holding chair with gentle lean toward right side. To be gently only and avoid pain.

## 2016-09-18 ENCOUNTER — Ambulatory Visit (INDEPENDENT_AMBULATORY_CARE_PROVIDER_SITE_OTHER): Payer: Medicare Other | Admitting: Physical Therapy

## 2016-09-18 DIAGNOSIS — M25612 Stiffness of left shoulder, not elsewhere classified: Secondary | ICD-10-CM

## 2016-09-18 DIAGNOSIS — R293 Abnormal posture: Secondary | ICD-10-CM

## 2016-09-18 DIAGNOSIS — M6281 Muscle weakness (generalized): Secondary | ICD-10-CM | POA: Diagnosis not present

## 2016-09-18 DIAGNOSIS — M25512 Pain in left shoulder: Secondary | ICD-10-CM

## 2016-09-18 NOTE — Therapy (Signed)
[place], [place], [address] Phone: [phone]   Fax:  [phone]  Physical Therapy Treatment  Patient Details  Name: Paige Lozano MRN: [id number] Date of Birth: 08-Apr-1966 Referring Provider: Dr. Tania Ade  Encounter Date: 09/18/2016      PT End of Session - 09/18/16 1014    Visit Number 14   Number of Visits 18   Date for PT Re-Evaluation 09/22/16   Authorization Time Period G-code at visit 20   PT Start Time 0934   PT Stop Time 1025   PT Time Calculation (min) 51 min   Activity Tolerance Patient tolerated treatment well;Patient limited by pain      Past Medical History:  Diagnosis Date  . Anxiety   . Bipolar 2 disorder, major depressive episode ([place])   . Complication of anesthesia   . COPD (chronic obstructive pulmonary disease) ([place])    smoker  . Fibromyalgia   . Insomnia   . Multiple sclerosis ([place])   . PONV (postoperative nausea and vomiting)   . Pulmonary embolism ([place]) 2000  . Sleep apnea    mild OSA no CPAP  . Ulcerative colitis [facility])     Past Surgical History:  Procedure Laterality Date  . FOOT SURGERY Left    bone spur  . HEMORRHOID SURGERY    . HUMERUS FRACTURE SURGERY Left   . MYOMECTOMY    . NASAL SINUS SURGERY    . SHOULDER ARTHROSCOPY WITH SUBACROMIAL DECOMPRESSION AND BICEP TENDON REPAIR Left 07/27/2016   Procedure: SHOULDER ARTHROSCOPY DEBRIDEMENT ROTATOR CUFF AND LABRUM, SUBACROMIAL DECOMPRESSION, BICEPS TENODESIS;  Surgeon: Tania Ade, MD;  Location: [facility];  Service: Orthopedics;  Laterality: Left;  SHOULDER ARTHROSCOPY DEBRIDEMENT ROTATOR CUFF AND LABRUM, SUBACROMIAL DECOMPRESSION, BICEPS TENOTOMY, POSSIBLE TENODESIS  . THUMB ARTHROSCOPY     5 surgeries on left thumb, 4 surgeries on right  . TONSILLECTOMY AND ADENOIDECTOMY      There were no vitals filed for this visit.      Subjective Assessment - 09/18/16 0939     Subjective Pt reports her Lt shoulder is still sore from running into wall. She has held off on shoulder exercises since last visit.  she is looking forward to next session of TDN since she had relief last time.    Patient Stated Goals return to swimming, water classes, walk dogs.    Currently in Pain? Yes   Pain Score 3    Pain Location Shoulder   Pain Orientation Left;Lateral;Anterior   Pain Descriptors / Indicators Sore   Aggravating Factors  lifting arm over head.    Pain Relieving Factors resting.             [facility] PT Assessment - 09/18/16 0001      Assessment   Medical Diagnosis Lt shoulder scope, biceps tenotomy & open tenodesis   Referring Provider Dr. Tania Ade   Onset Date/Surgical Date 07/27/16   Hand Dominance Right          [facility] Adult PT Treatment/Exercise - 09/18/16 0001      Elbow Exercises   Elbow Flexion Strengthening;Right;10 reps;Seated;Bar weights/barbell   Bar Weights/Barbell (Elbow Flexion) 2 lbs     Shoulder Exercises: Seated   Row Strengthening;Both;10 reps;Theraband  2 sets   Theraband Level (Shoulder Row) Level 2 (Red)   External Rotation Strengthening;Both;Theraband;20 reps   Theraband Level (Shoulder External Rotation) Level 1 (Yellow)   Flexion Left;5 reps   Flexion Weight (lbs) 1  early fatigue   Abduction Strengthening;Left;10 reps  to 90 deg,scaption.     Shoulder Exercises: Standing   Other Standing Exercises reverse push up, 3 sec hold x 10 reps     Shoulder Exercises: ROM/Strengthening   UBE (Upper Arm Bike) L1: alternating directions x 5 min    Wall Pushups 5 reps  2 sets     Modalities   Modalities Electrical Stimulation;Vasopneumatic     Electrical Stimulation   Electrical Stimulation Location Lt shoulder    Electrical Stimulation Action IFC    Electrical Stimulation Parameters to tolerance    Electrical Stimulation Goals Pain     Vasopneumatic   Number Minutes Vasopneumatic  15 minutes   Vasopnuematic  Location  Shoulder   Vasopneumatic Pressure Low   Vasopneumatic Temperature  3*     Manual Therapy   Kinesiotex Inhibit Muscle;Create Space     Kinesiotix   Create Space I lift strip applied to Lt bicep (proximal ) to decrease pain.     Inhibit Muscle  I strip at UT and levator, I              PT Long Term Goals - 09/16/16 1458      PT LONG TERM GOAL #1   Title I with advanced HEP to include water classes ( 09/22/16)    Time 6   Period Weeks   Status On-going     PT LONG TERM GOAL #2   Title demo Lt shoulder ROM WFL ( 09/22/16)    Time 6   Period Weeks   Status Achieved     PT LONG TERM GOAL #3   Title tolerate initial Lt UE strengthening ( 09/22/16)    Time 6   Period Weeks   Status On-going     PT LONG TERM GOAL #4   Title improve FOTO =/< 44% limited ( 09/22/16)    Time 6   Period Weeks   Status On-going     PT LONG TERM GOAL #5   Title Walk and play with dogs without difficulty ( 09/22/16)    Time 6   Period Weeks   Status On-going               Plan - 09/18/16 1023    Clinical Impression Statement Pt had improved exercise tolerance, yet still had some mild pain with resistance exercises.  She reported decreased pain at end of session with use of estim/ vaso.  Progressing well towards established goals.    Rehab Potential Excellent   PT Frequency 2x / week   PT Duration 6 weeks   PT Treatment/Interventions Moist Heat;Ultrasound;Therapeutic exercise;Dry needling;Taping;Vasopneumatic Device;Manual techniques;Cryotherapy;Electrical Stimulation;Passive range of motion;Patient/family education   PT Next Visit Plan Continue progressive strengthening for LUE.     Consulted and Agree with Plan of Care Patient      Patient will benefit from skilled therapeutic intervention in order to improve the following deficits and impairments:  Postural dysfunction, Increased edema, Decreased strength, Pain, Impaired UE functional use, Increased muscle  spasms  Visit Diagnosis: Stiffness of left shoulder, not elsewhere classified  Muscle weakness (generalized)  Abnormal posture  Acute pain of left shoulder     Problem List Patient Active Problem List   Diagnosis Date Noted  . History of pulmonary embolus (PE) 07/21/2016  . Right elbow pain 07/21/2016  . Right lateral epicondylitis 07/21/2016  . GAD (generalized anxiety disorder) 07/20/2016  . 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 Anxiety state 06/01/2016  .  Left shoulder pain 06/01/2016  . Multiple sclerosis ([place]) 05/29/2016  . Fibromyalgia 05/29/2016  . 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 Insomnia 05/29/2016  . Ulcerative pancolitis without complication ([place]) [id number]  . Bipolar 2 disorder ([place]) 05/29/2016  . Current smoker 05/29/2016   Kerin Perna, PTA 09/18/16 1:16 PM  [facility] [place], [place], [address] Phone: [phone]   Fax:  [phone]  Name: Ludwika Wolstenholme MRN: [id number] Date of Birth: 11-07-1966

## 2016-09-21 ENCOUNTER — Ambulatory Visit (HOSPITAL_COMMUNITY): Payer: Self-pay | Admitting: Psychiatry

## 2016-09-21 ENCOUNTER — Encounter: Payer: Self-pay | Admitting: Physical Therapy

## 2016-09-22 ENCOUNTER — Ambulatory Visit (INDEPENDENT_AMBULATORY_CARE_PROVIDER_SITE_OTHER): Payer: Medicare Other | Admitting: Licensed Clinical Social Worker

## 2016-09-22 DIAGNOSIS — F411 Generalized anxiety disorder: Secondary | ICD-10-CM | POA: Diagnosis not present

## 2016-09-22 DIAGNOSIS — F3181 Bipolar II disorder: Secondary | ICD-10-CM | POA: Diagnosis not present

## 2016-09-22 DIAGNOSIS — F4310 Post-traumatic stress disorder, unspecified: Secondary | ICD-10-CM | POA: Diagnosis not present
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
## 2016-09-23 ENCOUNTER — Encounter: Payer: Self-pay | Admitting: Physical Therapy

## 2016-09-25 ENCOUNTER — Encounter: Payer: Medicare Other | Admitting: Physical Therapy

## 2016-09-25 DIAGNOSIS — R2681 Unsteadiness on feet: Secondary | ICD-10-CM | POA: Diagnosis not present

## 2016-09-25 DIAGNOSIS — G603 Idiopathic progressive neuropathy: Secondary | ICD-10-CM | POA: Diagnosis not present

## 2016-09-25 DIAGNOSIS — R5383 Other fatigue: Secondary | ICD-10-CM | POA: Diagnosis not present

## 2016-09-25 DIAGNOSIS — Z79899 Other long term (current) drug therapy: Secondary | ICD-10-CM | POA: Diagnosis not present

## 2016-09-25 DIAGNOSIS — G35 Multiple sclerosis: Secondary | ICD-10-CM | POA: Diagnosis not present

## 2016-09-28 ENCOUNTER — Ambulatory Visit (INDEPENDENT_AMBULATORY_CARE_PROVIDER_SITE_OTHER): Payer: Medicare Other | Admitting: Rehabilitative and Restorative Service Providers"

## 2016-09-28 ENCOUNTER — Encounter: Payer: Self-pay | Admitting: Rehabilitative and Restorative Service Providers"

## 2016-09-28 DIAGNOSIS — M25512 Pain in left shoulder: Secondary | ICD-10-CM | POA: Diagnosis not present

## 2016-09-28 DIAGNOSIS — R293 Abnormal posture: Secondary | ICD-10-CM | POA: Diagnosis not present

## 2016-09-28 DIAGNOSIS — M6281 Muscle weakness (generalized): Secondary | ICD-10-CM

## 2016-09-28 DIAGNOSIS — M25612 Stiffness of left shoulder, not elsewhere classified: Secondary | ICD-10-CM | POA: Diagnosis present

## 2016-09-28 NOTE — Therapy (Signed)
[place]  [facility] [place], [place], [address] Phone: [phone]   Fax:  [phone]  Physical Therapy Treatment  Patient Details  Name: Paige Lozano MRN: [id number] Date of Birth: 1966/02/06 Referring Provider: Dr. Tania Ade  Encounter Date: 09/28/2016      PT End of Session - 09/28/16 1152    Visit Number 15   Number of Visits 18   Date for PT Re-Evaluation 09/22/16   PT Start Time 1146   PT Stop Time 1231   PT Time Calculation (min) 45 min   Activity Tolerance Patient tolerated treatment well      Past Medical History:  Diagnosis Date  . Anxiety   . Bipolar 2 disorder, major depressive episode ([place])   . Complication of anesthesia   . COPD (chronic obstructive pulmonary disease) ([place])    smoker  . Fibromyalgia   . Insomnia   . Multiple sclerosis ([place])   . PONV (postoperative nausea and vomiting)   . Pulmonary embolism ([place]) 2000  . Sleep apnea    mild OSA no CPAP  . Ulcerative colitis [facility])     Past Surgical History:  Procedure Laterality Date  . FOOT SURGERY Left    bone spur  . HEMORRHOID SURGERY    . HUMERUS FRACTURE SURGERY Left   . MYOMECTOMY    . NASAL SINUS SURGERY    . SHOULDER ARTHROSCOPY WITH SUBACROMIAL DECOMPRESSION AND BICEP TENDON REPAIR Left 07/27/2016   Procedure: SHOULDER ARTHROSCOPY DEBRIDEMENT ROTATOR CUFF AND LABRUM, SUBACROMIAL DECOMPRESSION, BICEPS TENODESIS;  Surgeon: Tania Ade, MD;  Location: [facility];  Service: Orthopedics;  Laterality: Left;  SHOULDER ARTHROSCOPY DEBRIDEMENT ROTATOR CUFF AND LABRUM, SUBACROMIAL DECOMPRESSION, BICEPS TENOTOMY, POSSIBLE TENODESIS  . THUMB ARTHROSCOPY     5 surgeries on left thumb, 4 surgeries on right  . TONSILLECTOMY AND ADENOIDECTOMY      There were no vitals filed for this visit.      Subjective Assessment - 09/28/16 1152    Subjective Lt shoulder is "Okay" Wants to go ahead with the TDN today.  Seems to be improving from running into the wall. Doing exercises at home again this weekend.    Currently in Pain? No/denies   Pain Location Shoulder   Pain Orientation Left;Lateral;Anterior   Pain Descriptors / Indicators Tightness                         [facility] Adult PT Treatment/Exercise - 09/28/16 0001      Lumbar Exercises: Aerobic   UBE (Upper Arm Bike) L2 X5 min     Moist Heat Therapy   Number Minutes Moist Heat 15 Minutes   Moist Heat Location Shoulder;Cervical  Lt ant shd/biceps     Electrical Stimulation   Electrical Stimulation Location Lt shoulder/biceps   Electrical Stimulation Action IFC   Electrical Stimulation Parameters to tolerance   Electrical Stimulation Goals Pain;Tone     Manual Therapy   Manual therapy comments pt prone and supine    Soft tissue mobilization pecs; upper trap; leveator; rhomboids; supraspinitus; infraspinitus; teres; deltoid; biceps (min tolerance for STM through ant deltoid and biceps    Myofascial Release Lt pec/anterior chest; Lt upper trap/leveator   Scapular Mobilization Lt scapula inferior glide          Trigger Point Dry Needling - 09/28/16 1225    Consent Given? Yes  TDN to Lt upper quarter    Muscles Treated Upper Body --  Lt biceps - palpable decrease in tightness    Upper Trapezius Response Palpable increased muscle length   Levator Scapulae Response Palpable increased muscle length   Rhomboids Response Palpable increased muscle length   Supraspinatus Response Palpable increased muscle length   Infraspinatus Response Palpable increased muscle length                   PT Long Term Goals - 09/28/16 1230      PT LONG TERM GOAL #1   Title I with advanced HEP to include water classes ( 09/22/16)    Time 6   Period Weeks   Status On-going     PT LONG TERM GOAL #2   Title demo Lt shoulder ROM WFL ( 09/22/16)    Time 6   Period Weeks   Status Achieved     PT LONG TERM GOAL #3   Title  tolerate initial Lt UE strengthening ( 09/22/16)    Time 6   Period Weeks   Status On-going     PT LONG TERM GOAL #4   Title improve FOTO =/< 44% limited ( 09/22/16)    Time 6   Period Weeks   Status On-going     PT LONG TERM GOAL #5   Title Walk and play with dogs without difficulty ( 09/22/16)    Time 6   Period Weeks   Status On-going               Plan - 09/28/16 1227    Clinical Impression Statement Good response to TDN with palpable decrease in muscular tightness and increase in tissue extensibility. Patient is improving from running into the wall. Reports that she has started doing her exercises again now.    Rehab Potential Excellent   PT Frequency 2x / week   PT Duration 6 weeks   PT Treatment/Interventions Moist Heat;Ultrasound;Therapeutic exercise;Dry needling;Taping;Vasopneumatic Device;Manual techniques;Cryotherapy;Electrical Stimulation;Passive range of motion;Patient/family education   PT Next Visit Plan Continue progressive strengthening for LUE.     Consulted and Agree with Plan of Care Patient      Patient will benefit from skilled therapeutic intervention in order to improve the following deficits and impairments:  Postural dysfunction, Increased edema, Decreased strength, Pain, Impaired UE functional use, Increased muscle spasms  Visit Diagnosis: Stiffness of left shoulder, not elsewhere classified  Muscle weakness (generalized)  Abnormal posture  Acute pain of left shoulder     Problem List Patient Active Problem List   Diagnosis Date Noted  . History of pulmonary embolus (PE) 07/21/2016  . Right elbow pain 07/21/2016  . Right lateral epicondylitis 07/21/2016  . GAD (generalized anxiety disorder) 07/20/2016  . 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 Anxiety state 06/01/2016  . Left shoulder pain 06/01/2016  . Multiple sclerosis ([place]) 05/29/2016  . Fibromyalgia 05/29/2016  . 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  Insomnia 05/29/2016  . Ulcerative pancolitis without complication ([place]) [id number]  . Bipolar 2 disorder ([place]) 05/29/2016  . Current smoker 05/29/2016    Rodd Heft Nilda Simmer PT, MPH  09/28/2016, 12:32 PM  [facility] [place], [place], [address] Phone: [phone]   Fax:  [phone]  Name: Nalia Stefko MRN: [id number] Date of Birth: 11/29/1965

## 2016-09-30 ENCOUNTER — Ambulatory Visit (INDEPENDENT_AMBULATORY_CARE_PROVIDER_SITE_OTHER): Payer: Medicare Other | Admitting: Physical Therapy

## 2016-09-30 ENCOUNTER — Encounter: Payer: Self-pay | Admitting: Physical Therapy

## 2016-09-30 DIAGNOSIS — M6281 Muscle weakness (generalized): Secondary | ICD-10-CM | POA: Diagnosis not present

## 2016-09-30 DIAGNOSIS — M25512 Pain in left shoulder: Secondary | ICD-10-CM

## 2016-09-30 DIAGNOSIS — M25612 Stiffness of left shoulder, not elsewhere classified: Secondary | ICD-10-CM

## 2016-09-30 DIAGNOSIS — R293 Abnormal posture: Secondary | ICD-10-CM | POA: Diagnosis not present
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
## 2016-10-06 ENCOUNTER — Other Ambulatory Visit: Payer: Self-pay | Admitting: Family Medicine

## 2016-10-06 ENCOUNTER — Other Ambulatory Visit: Payer: Self-pay | Admitting: Physician Assistant

## 2016-10-06 ENCOUNTER — Ambulatory Visit (HOSPITAL_COMMUNITY): Payer: Self-pay | Admitting: Licensed Clinical Social Worker

## 2016-10-07 ENCOUNTER — Ambulatory Visit (INDEPENDENT_AMBULATORY_CARE_PROVIDER_SITE_OTHER): Payer: Medicare Other | Admitting: Rehabilitative and Restorative Service Providers"

## 2016-10-07 ENCOUNTER — Encounter: Payer: Self-pay | Admitting: Rehabilitative and Restorative Service Providers"

## 2016-10-07 DIAGNOSIS — M6281 Muscle weakness (generalized): Secondary | ICD-10-CM | POA: Diagnosis not present

## 2016-10-07 DIAGNOSIS — M25512 Pain in left shoulder: Secondary | ICD-10-CM | POA: Diagnosis not present

## 2016-10-07 DIAGNOSIS — R293 Abnormal posture: Secondary | ICD-10-CM | POA: Diagnosis not present

## 2016-10-07 DIAGNOSIS — M25612 Stiffness of left shoulder, not elsewhere classified: Secondary | ICD-10-CM

## 2016-10-07 NOTE — Therapy (Signed)
[place], [place], [address] Phone: [phone]   Fax:  [phone]  Physical Therapy Treatment  Patient Details  Name: Paige Lozano MRN: [id number] Date of Birth: 1966-08-14 Referring Provider: Dr. Tania Ade  Encounter Date: 10/07/2016      PT End of Session - 10/07/16 0934    Visit Number 17   Number of Visits 18   Date for PT Re-Evaluation 09/22/16   PT Start Time 0906   PT Stop Time 0944   PT Time Calculation (min) 38 min   Activity Tolerance Patient tolerated treatment well      Past Medical History:  Diagnosis Date  . Anxiety   . Bipolar 2 disorder, major depressive episode ([place])   . Complication of anesthesia   . COPD (chronic obstructive pulmonary disease) ([place])    smoker  . Fibromyalgia   . Insomnia   . Multiple sclerosis ([place])   . PONV (postoperative nausea and vomiting)   . Pulmonary embolism ([place]) 2000  . Sleep apnea    mild OSA no CPAP  . Ulcerative colitis [facility])     Past Surgical History:  Procedure Laterality Date  . FOOT SURGERY Left    bone spur  . HEMORRHOID SURGERY    . HUMERUS FRACTURE SURGERY Left   . MYOMECTOMY    . NASAL SINUS SURGERY    . SHOULDER ARTHROSCOPY WITH SUBACROMIAL DECOMPRESSION AND BICEP TENDON REPAIR Left 07/27/2016   Procedure: SHOULDER ARTHROSCOPY DEBRIDEMENT ROTATOR CUFF AND LABRUM, SUBACROMIAL DECOMPRESSION, BICEPS TENODESIS;  Surgeon: Tania Ade, MD;  Location: [place];  Service: Orthopedics;  Laterality: Left;  SHOULDER ARTHROSCOPY DEBRIDEMENT ROTATOR CUFF AND LABRUM, SUBACROMIAL DECOMPRESSION, BICEPS TENOTOMY, POSSIBLE TENODESIS  . THUMB ARTHROSCOPY     5 surgeries on left thumb, 4 surgeries on right  . TONSILLECTOMY AND ADENOIDECTOMY      There were no vitals filed for this visit.      Subjective Assessment - 10/07/16 0935    Subjective Lt anterior arm is sore and tight. Hurts to touch. Needs to  loosen that area up. Can feel a difference after treatment today.    Currently in Pain? Yes   Pain Score 4    Pain Location Shoulder   Pain Orientation Left;Anterior   Pain Descriptors / Indicators Tightness   Pain Onset More than a month ago   Pain Frequency Intermittent                         [facility] Adult PT Treatment/Exercise - 10/07/16 0001      Lumbar Exercises: Aerobic   UBE (Upper Arm Bike) L2 X4 min alt fwd/back     Shoulder Exercises: Supine   Other Supine Exercises PT assisted stretch on edge of table - shd extension/elbow extension 30 sec x 2      Shoulder Exercises: Stretch   Other Shoulder Stretches Standing Lt shoulder ext  stretch x 30 sec x 2 reps   Other Shoulder Stretches doorway stretch 3 positions 30 sec x 2      Moist Heat Therapy   Number Minutes Moist Heat 8 Minutes   Moist Heat Location Shoulder;Cervical     Electrical Stimulation   Electrical Stimulation Location Lt shoulder, bicep   Electrical Stimulation Action IFC   Electrical Stimulation Parameters to tolerance   Electrical Stimulation Goals Pain;Tone     Ultrasound   Ultrasound Location Lt ant arm    Ultrasound  Parameters proximal incision and biceps area 1 mHz; 1.5 w/cm2; 100%; 8 min    Ultrasound Goals Pain;Other (Comment)     Manual Therapy   Manual therapy comments pt supine   Soft tissue mobilization Lt anterior arm/biceps - minimal pressure   Myofascial Release Lt ant arm/biceps - light pressure                      PT Long Term Goals - 10/07/16 0944      PT LONG TERM GOAL #1   Title I with advanced HEP to include water classes ( 09/22/16)    Time 6   Period Weeks   Status On-going     PT LONG TERM GOAL #2   Title demo Lt shoulder ROM WFL ( 09/22/16)    Time 6   Period Weeks   Status Achieved     PT LONG TERM GOAL #3   Title tolerate initial Lt UE strengthening ( 09/22/16)    Time 6   Period Weeks   Status Achieved     PT LONG TERM GOAL  #4   Title improve FOTO =/< 44% limited ( 09/22/16)    Time 6   Period Weeks   Status On-going     PT LONG TERM GOAL #5   Title Walk and play with dogs without difficulty ( 09/22/16)    Time 6   Period Weeks   Status On-going               Plan - 10/07/16 0942    Clinical Impression Statement Tightness and myofacial restrictions Lt ant arm/biceps area. Noted tightness through proximal incision. Responded well to US/manual work.    Rehab Potential Excellent   PT Frequency 2x / week   PT Duration 6 weeks   PT Treatment/Interventions Moist Heat;Ultrasound;Therapeutic exercise;Dry needling;Taping;Vasopneumatic Device;Manual techniques;Cryotherapy;Electrical Stimulation;Passive range of motion;Patient/family education   PT Next Visit Plan continue UE strengthening. Manual and modalities to address myofacial pain and tightness    Consulted and Agree with Plan of Care Patient      Patient will benefit from skilled therapeutic intervention in order to improve the following deficits and impairments:  Postural dysfunction, Increased edema, Decreased strength, Pain, Impaired UE functional use, Increased muscle spasms  Visit Diagnosis: Stiffness of left shoulder, not elsewhere classified  Muscle weakness (generalized)  Abnormal posture  Acute pain of left shoulder     Problem List Patient Active Problem List   Diagnosis Date Noted  . History of pulmonary embolus (PE) 07/21/2016  . Right elbow pain 07/21/2016  . Right lateral epicondylitis 07/21/2016  . GAD (generalized anxiety disorder) 07/20/2016  . 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 Anxiety state 06/01/2016  . Left shoulder pain 06/01/2016  . Multiple sclerosis ([place]) 05/29/2016  . Fibromyalgia 05/29/2016  . 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 Insomnia 05/29/2016  . Ulcerative pancolitis without complication ([place]) [id number]  . Bipolar 2 disorder ([place]) 05/29/2016  . Current smoker  05/29/2016    Sanika Brosious Nilda Simmer PT, MPH  10/07/2016, 9:46 AM  [facility] [place], [place], [address] Phone: [phone]   Fax:  [phone]  Name: Sieara Gallik MRN: [id number] Date of Birth: 05/12/1966

## 2016-10-12 ENCOUNTER — Encounter: Payer: Self-pay | Admitting: Physical Therapy

## 2016-10-13 ENCOUNTER — Other Ambulatory Visit: Payer: Self-pay | Admitting: Physician Assistant

## 2016-10-13 ENCOUNTER — Other Ambulatory Visit (HOSPITAL_COMMUNITY): Payer: Self-pay | Admitting: Psychiatry

## 2016-10-14 ENCOUNTER — Encounter: Payer: Self-pay | Admitting: Rehabilitative and Restorative Service Providers"

## 2016-10-14 ENCOUNTER — Ambulatory Visit (HOSPITAL_COMMUNITY): Payer: Self-pay | Admitting: Psychiatry

## 2016-10-16 ENCOUNTER — Encounter: Payer: Self-pay | Admitting: Physician Assistant

## 2016-10-16 ENCOUNTER — Ambulatory Visit (INDEPENDENT_AMBULATORY_CARE_PROVIDER_SITE_OTHER): Payer: Medicare Other | Admitting: Physician Assistant

## 2016-10-16 ENCOUNTER — Encounter: Payer: Self-pay | Admitting: Rehabilitative and Restorative Service Providers"

## 2016-10-16 ENCOUNTER — Ambulatory Visit (INDEPENDENT_AMBULATORY_CARE_PROVIDER_SITE_OTHER): Payer: Medicare Other | Admitting: Rehabilitative and Restorative Service Providers"

## 2016-10-16 VITALS — BP 111/67 | HR 109 | Temp 98.0°F | Ht 68.0 in | Wt 183.0 lb

## 2016-10-16 DIAGNOSIS — J0100 Acute maxillary sinusitis, unspecified: Secondary | ICD-10-CM | POA: Diagnosis not present

## 2016-10-16 DIAGNOSIS — M25612 Stiffness of left shoulder, not elsewhere classified: Secondary | ICD-10-CM | POA: Diagnosis present

## 2016-10-16 DIAGNOSIS — R293 Abnormal posture: Secondary | ICD-10-CM

## 2016-10-16 DIAGNOSIS — M2241 Chondromalacia patellae, right knee: Secondary | ICD-10-CM | POA: Diagnosis not present

## 2016-10-16 DIAGNOSIS — M25561 Pain in right knee: Secondary | ICD-10-CM | POA: Diagnosis not present

## 2016-10-16 DIAGNOSIS — B379 Candidiasis, unspecified: Secondary | ICD-10-CM

## 2016-10-16 DIAGNOSIS — M6281 Muscle weakness (generalized): Secondary | ICD-10-CM | POA: Diagnosis not present

## 2016-10-16 DIAGNOSIS — G8929 Other chronic pain: Secondary | ICD-10-CM

## 2016-10-16 DIAGNOSIS — M25512 Pain in left shoulder: Secondary | ICD-10-CM | POA: Diagnosis not present

## 2016-10-16 MED ORDER — FLUCONAZOLE 150 MG PO TABS: 150.0000 mg | ORAL_TABLET | Freq: Once | ORAL | 0 refills | Status: AC

## 2016-10-16 MED ORDER — AMOXICILLIN-POT CLAVULANATE 875-125 MG PO TABS: 1.0000 | ORAL_TABLET | Freq: Two times a day (BID) | ORAL | 0 refills | Status: DC

## 2016-10-16 MED ORDER — DICLOFENAC SODIUM 2 % TD SOLN: TRANSDERMAL | 1 refills | Status: DC

## 2016-10-16 NOTE — Patient Instructions (Signed)
Will get MRI and send info to Dr. Darene Lamer.

## 2016-10-16 NOTE — Progress Notes (Signed)
   Subjective:    Patient ID: Paige Lozano, female    DOB: December 23, 1965, 50 y.o.   MRN: [id number]  HPI  Pt is a 50 yo female who presents to the clinic with sinus pressure, nasal congestion, ear pain. She has not felt well in 3 weeks. She had flu like symptoms to start out and after a week began to feel a little better. 3 days ago started with sinus pressure, facial pain, congestion and ST. She has been on cough drops and has hx of sinus surgery in [place].   She continues to have right knee pain. She saw Dr. Darene Lamer back in August and injection was done. Seemed to help for a few months but pain is back. Located on the lateral right knee. Hurts to squat or go up stairs. Painful with any twisting. She is not wearing a brace. Ibuprofen is not really helping. Ran out of mobic. No new injury.  .   Review of Systems  All other systems reviewed and are negative.      Objective:   Physical Exam  Constitutional: She is oriented to person, place, and time. She appears well-developed and well-nourished.  HENT:  Head: Normocephalic and atraumatic.  Cardiovascular: Normal rate, regular rhythm and normal heart sounds.   Pulmonary/Chest: Effort normal and breath sounds normal.  Musculoskeletal:  NROM of right knee.  Tenderness in the right later joint space and over patella.  Strength 5/5.  Pain with Mcmurrays but no clicking.   Neurological: She is alert and oriented to person, place, and time.  Psychiatric: She has a normal mood and affect. Her behavior is normal.          Assessment & Plan:  Marland KitchenMarland KitchenNatachia was seen today for sore throat and knee pain.  Diagnoses and all orders for this visit:  Acute non-recurrent maxillary sinusitis -     amoxicillin-clavulanate (AUGMENTIN) 875-125 MG tablet; Take 1 tablet by mouth 2 (two) times daily. For 10 days.  Chondromalacia of right patellofemoral joint -     Diclofenac Sodium (PENNSAID) 2 % SOLN; 2 pumps twice a day application to painful area.  Chronic  pain of right knee -     Diclofenac Sodium (PENNSAID) 2 % SOLN; 2 pumps twice a day application to painful area.  Yeast infection -     fluconazole (DIFLUCAN) 150 MG tablet; Take 1 tablet (150 mg total) by mouth once. Repeat after finish antibiotic.   Will send note to Dr. Darene Lamer to consider MRI or next step therapy. Injection lasted 2 months.  Placed in knee brace to wear today for support. pennsaid added.

## 2016-10-16 NOTE — Therapy (Signed)
[place], [place], [address] Phone: [phone]   Fax:  [phone]  Physical Therapy Treatment  Patient Details  Name: Paige Lozano MRN: [id number] Date of Birth: 10/28/1966 Referring Provider: Dr Tania Ade  Encounter Date: 10/16/2016      PT End of Session - 10/16/16 1255    Visit Number 17   Number of Visits 20   Date for PT Re-Evaluation 10/27/17   Authorization Time Period G-code at visit 30   PT Start Time 1106   PT Stop Time 1158   PT Time Calculation (min) 52 min   Activity Tolerance Patient tolerated treatment well      Past Medical History:  Diagnosis Date  . Anxiety   . Bipolar 2 disorder, major depressive episode ([place])   . Complication of anesthesia   . COPD (chronic obstructive pulmonary disease) ([place])    smoker  . Fibromyalgia   . Insomnia   . Multiple sclerosis ([place])   . PONV (postoperative nausea and vomiting)   . Pulmonary embolism ([place]) 2000  . Sleep apnea    mild OSA no CPAP  . Ulcerative colitis [facility])     Past Surgical History:  Procedure Laterality Date  . FOOT SURGERY Left    bone spur  . HEMORRHOID SURGERY    . HUMERUS FRACTURE SURGERY Left   . MYOMECTOMY    . NASAL SINUS SURGERY    . SHOULDER ARTHROSCOPY WITH SUBACROMIAL DECOMPRESSION AND BICEP TENDON REPAIR Left 07/27/2016   Procedure: SHOULDER ARTHROSCOPY DEBRIDEMENT ROTATOR CUFF AND LABRUM, SUBACROMIAL DECOMPRESSION, BICEPS TENODESIS;  Surgeon: Tania Ade, MD;  Location: [place];  Service: Orthopedics;  Laterality: Left;  SHOULDER ARTHROSCOPY DEBRIDEMENT ROTATOR CUFF AND LABRUM, SUBACROMIAL DECOMPRESSION, BICEPS TENOTOMY, POSSIBLE TENODESIS  . THUMB ARTHROSCOPY     5 surgeries on left thumb, 4 surgeries on right  . TONSILLECTOMY AND ADENOIDECTOMY      There were no vitals filed for this visit.      Subjective Assessment - 10/16/16 1111    Subjective Having "a lot" of  problems with her arm this week. She has had a "hugh lump" in the front of the arm(has been present) which is more pronounced. Stretched some yesterday and it feels better. Scheduled appt with dr [name] for 10/26/16.    Currently in Pain? Yes   Pain Score 4    Pain Location Shoulder   Pain Orientation Left;Anterior   Pain Descriptors / Indicators Tightness            [facility] PT Assessment - 10/16/16 0001      Assessment   Medical Diagnosis Lt shoulder scope, biceps tenotomy & open tenodesis   Referring Provider Dr Tania Ade   Onset Date/Surgical Date 07/27/16   Hand Dominance Right     Observation/Other Assessments   Focus on Therapeutic Outcomes (FOTO)  35% limitation      Posture/Postural Control   Posture Comments head forward; shoulders rotated; scapulae abducted     AROM   Left Shoulder Extension 68 Degrees   Left Shoulder Flexion 170 Degrees   Left Shoulder ABduction 170 Degrees   Left Shoulder Internal Rotation 42 Degrees   Left Shoulder External Rotation 91 Degrees   Left Elbow Flexion 143   Left Elbow Extension 0   Cervical Flexion WNL   Cervical Extension WNL   Cervical - Right Side Bend WNL   Cervical - Left Side Bend WNL   Cervical - Right Rotation  WNL   Cervical - Left Rotation WNL     PROM   Left Shoulder Flexion 170 Degrees   Left Shoulder ABduction 170 Degrees   Left Shoulder Internal Rotation 88 Degrees   Left Shoulder External Rotation 94 Degrees     Strength   Left Shoulder Flexion 4+/5   Left Shoulder Extension --  5-/5   Left Shoulder ABduction 4+/5   Left Shoulder Internal Rotation --  5-/5   Left Shoulder External Rotation 4+/5   Left Elbow Flexion 4/5   Left Elbow Extension 5/5     Palpation   Palpation comment tenderness and muscular banding/knotted area Lt biceps prominent proximal to proximal incision                      [facility] Adult PT Treatment/Exercise - 10/16/16 0001      Lumbar Exercises: Aerobic   UBE  (Upper Arm Bike) L3 X4 min alt fwd/back     Shoulder Exercises: Standing   Other Standing Exercises scap squeeze 10 sec x 10      Shoulder Exercises: Stretch   Other Shoulder Stretches Standing Lt shoulder ext  stretch x 30 sec x 2 reps   Other Shoulder Stretches doorway stretch 3 positions 30 sec x 2      Moist Heat Therapy   Number Minutes Moist Heat 12 Minutes   Moist Heat Location Shoulder  Lt biceps      Ultrasound   Ultrasound Location Lt anterior arm at area of banding and tightness Lt biceps    Ultrasound Parameters 1.5 w/cm2; 1 mHz; 100-50% - 50/50 during [place]; 8 min    Ultrasound Goals Pain;Other (Comment)  muscular tightness     Manual Therapy   Manual therapy comments pt supine   Soft tissue mobilization Lt anterior arm/biceps - minimal pressure                PT Education - 10/16/16 1254    Education provided Yes   Education Details discussed nature of tissue healing and flare up of symptoms with certain activities    Person(s) Educated Patient   Methods Explanation   Comprehension Verbalized understanding             PT Long Term Goals - 10/16/16 1303      PT LONG TERM GOAL #1   Title I with advanced HEP to include water classes ( 11/27/16)    Time 12   Period Weeks   Status Revised     PT LONG TERM GOAL #2   Title demo Lt shoulder ROM WFL ( 09/22/16)    Time 6   Period Weeks   Status Achieved     PT LONG TERM GOAL #3   Title increase strength Lt UE to 5-/5 to 5/5 throughout 11/27/16   Time 6   Period Weeks   Status New     PT LONG TERM GOAL #4   Title improve FOTO =/< 44% limited ( 09/22/16)    Time 6   Period Weeks   Status Achieved     PT LONG TERM GOAL #5   Title Walk and play with dogs without difficulty ( 11/27/16)    Time 12   Period Weeks   Status Revised               Plan - 10/16/16 1256    Clinical Impression Statement Patient reports flare up of pain and tightness through the biceps knott area over  the  weekend with some swelling into the first of he week She is frustrated with the continued pain and flare ups. Feel the shoudler and arm should be better by now. Discussed with pt that the tissue was injured/damaged for over a year prior to surgery and that she is likely dealing with adaptive muscle shortening and scaring in the area. Encouraged consistent HEP and monitering activities, especially activities that require sustained elevation of arms of forward lifting/reaching. Patient was instructed to continue with stretching activities for biceps and use of heat/ice for flare ups of tissue. Noted increased ROM/strenght/functiona with goals of therapy partially accomplished. Paige Lozano has decreased muscular tightness and banding through the biceps area just proximal to incision, but does report continued flare upincluding swelling in this area. She should continue to show progress with this tissue tightness with consistent work and should expect episodic flare ups related to activity.     Rehab Potential Excellent   PT Frequency 2x / week   PT Duration 6 weeks   PT Treatment/Interventions Moist Heat;Ultrasound;Therapeutic exercise;Dry needling;Taping;Vasopneumatic Device;Manual techniques;Cryotherapy;Electrical Stimulation;Passive range of motion;Patient/family education   PT Next Visit Plan continue UE strengthening. Manual and modalities to address myofacial pain and tightness    Consulted and Agree with Plan of Care Patient      Patient will benefit from skilled therapeutic intervention in order to improve the following deficits and impairments:  Postural dysfunction, Increased edema, Decreased strength, Pain, Impaired UE functional use, Increased muscle spasms  Visit Diagnosis: Stiffness of left shoulder, not elsewhere classified - Plan: PT plan of care cert/re-cert  Muscle weakness (generalized) - Plan: PT plan of care cert/re-cert  Abnormal posture - Plan: PT plan of care cert/re-cert  Acute pain  of left shoulder - Plan: PT plan of care cert/re-cert     Problem List Patient Active Problem List   Diagnosis Date Noted  . History of pulmonary embolus (PE) 07/21/2016  . Right elbow pain 07/21/2016  . Right lateral epicondylitis 07/21/2016  . GAD (generalized anxiety disorder) 07/20/2016  . 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 Anxiety state 06/01/2016  . Left shoulder pain 06/01/2016  . Multiple sclerosis ([place]) 05/29/2016  . Fibromyalgia 05/29/2016  . 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 Insomnia 05/29/2016  . Ulcerative pancolitis without complication ([place]) XX123456  . Bipolar 2 disorder ([place]) 05/29/2016  . Current smoker 05/29/2016    Athel Merriweather Nilda Simmer PT, MPH  10/16/2016, 1:09 PM  [facility] [place], [place], [address] Phone: [phone]   Fax:  [phone]  Name: Paige Lozano MRN: [id number] Date of Birth: [birth year]

## 2016-10-19 ENCOUNTER — Encounter: Payer: Self-pay | Admitting: Physician Assistant

## 2016-10-21 ENCOUNTER — Encounter: Payer: Self-pay | Admitting: Rehabilitative and Restorative Service Providers"

## 2016-10-21 MED ORDER — OLANZAPINE 2.5 MG PO TABS: 2.5000 mg | ORAL_TABLET | Freq: Every day | ORAL | 0 refills | Status: DC

## 2016-10-21 NOTE — Telephone Encounter (Addendum)
Received fax from [facility] requesting a refill for Zyprexa. Per Dr. De Nurse, refill is authorize for Zyprexa 2.5mg , #30. Rx was sent to pharmacy.  Pt is schedule for a f/u apt on 12/14. Lvm informing pt of refill status.

## 2016-10-22 ENCOUNTER — Encounter (HOSPITAL_COMMUNITY): Payer: Self-pay | Admitting: Psychiatry

## 2016-10-22 ENCOUNTER — Ambulatory Visit (INDEPENDENT_AMBULATORY_CARE_PROVIDER_SITE_OTHER): Payer: Medicare Other | Admitting: Psychiatry

## 2016-10-22 VITALS — BP 116/70 | HR 75 | Resp 16 | Ht 68.0 in | Wt 183.0 lb

## 2016-10-22 DIAGNOSIS — Z79899 Other long term (current) drug therapy: Secondary | ICD-10-CM

## 2016-10-22 DIAGNOSIS — Z888 Allergy status to other drugs, medicaments and biological substances status: Secondary | ICD-10-CM

## 2016-10-22 DIAGNOSIS — F411 Generalized anxiety disorder: Secondary | ICD-10-CM | POA: Diagnosis not present

## 2016-10-22 DIAGNOSIS — Z87891 Personal history of nicotine dependence: Secondary | ICD-10-CM

## 2016-10-22 DIAGNOSIS — F4310 Post-traumatic stress disorder, unspecified: Secondary | ICD-10-CM

## 2016-10-22 DIAGNOSIS — F5102 Adjustment insomnia: Secondary | ICD-10-CM | POA: Diagnosis not present

## 2016-10-22 DIAGNOSIS — F3181 Bipolar II disorder: Secondary | ICD-10-CM | POA: Diagnosis not present

## 2016-10-22 MED ORDER — OLANZAPINE 2.5 MG PO TABS: 2.5000 mg | ORAL_TABLET | Freq: Every day | ORAL | 1 refills | Status: DC

## 2016-10-22 NOTE — Progress Notes (Signed)
[facility] Outpatient Follow up visit   Patient Identification: Paige Lozano MRN:  [id number] Date of Evaluation:  10/22/2016 Referral Source: [facility] Primary care Chief Complaint:   Chief Complaint    Follow-up     Visit Diagnosis:  No diagnosis found.  History of Present Illness:  50 years old currently single Caucasian female initially referred by primary care physician for management of bipolar, insomnia  Patient feels balanced olanzapine but feels that she does not have any emotions and does not even cry at times that she should cry she wants to cut down the dose and see She takes when necessary Xanax only  Last visit we cut down the olanzapine to 2.5 mg so that she can have some emotion she is having some emotion at times she cries she likes the lower dose she is more alert there is no otherwise concern about dysfunction She takes Xanax for when necessary anxiety and sleep difficulties    Aggravating factor: friend in [place] and has cancer. Patient suffering from multiple medical conditions including fibromyalgia, multiple sclerosis. Poor finances Modifying factors; her dog. Other dog was put to sleep       Previous Psychotropic Medications: Yes   Substance Abuse History in the last 12 months:  Yes.    Marijuana says it is medical for her condition.  Consequences of Substance Abuse: Medical Consequences:  fatigue, poor concenctration  Past Medical History:  Past Medical History:  Diagnosis Date  . Anxiety   . Bipolar 2 disorder, major depressive episode ([place])   . Complication of anesthesia   . COPD (chronic obstructive pulmonary disease) ([place])    smoker  . Fibromyalgia   . Insomnia   . Multiple sclerosis ([place])   . PONV (postoperative nausea and vomiting)   . Pulmonary embolism ([place]) 2000  . Sleep apnea    mild OSA no CPAP  . Ulcerative colitis [facility])     Past Surgical History:  Procedure Laterality Date  . FOOT SURGERY Left    bone spur  . HEMORRHOID SURGERY     . HUMERUS FRACTURE SURGERY Left   . MYOMECTOMY    . NASAL SINUS SURGERY    . SHOULDER ARTHROSCOPY WITH SUBACROMIAL DECOMPRESSION AND BICEP TENDON REPAIR Left 07/27/2016   Procedure: SHOULDER ARTHROSCOPY DEBRIDEMENT ROTATOR CUFF AND LABRUM, SUBACROMIAL DECOMPRESSION, BICEPS TENODESIS;  Surgeon: Tania Ade, MD;  Location: [place];  Service: Orthopedics;  Laterality: Left;  SHOULDER ARTHROSCOPY DEBRIDEMENT ROTATOR CUFF AND LABRUM, SUBACROMIAL DECOMPRESSION, BICEPS TENOTOMY, POSSIBLE TENODESIS  . THUMB ARTHROSCOPY     5 surgeries on left thumb, 4 surgeries on right  . TONSILLECTOMY AND ADENOIDECTOMY      Family Psychiatric History: adapted, so not aware  Family History:  Family History  Problem Relation Age of Onset  . Adopted: Yes    Social History:   Social History   Social History  . Marital status: Divorced    Spouse name: N/A  . Number of children: N/A  . Years of education: N/A   Social History Main Topics  . Smoking status: Former Smoker    Quit date: 07/08/2016  . Smokeless tobacco: Never Used     Comment: currently taking Chantix  . Alcohol use 0.0 oz/week     Comment: rare   . Drug use: No     Comment: last use 3 month ago  . Sexual activity: Yes    Birth control/ protection: None   Other Topics Concern  . None   Social History  Narrative  . None     Allergies:   Allergies  Allergen Reactions  . Ketamine Anaphylaxis  . Oxycodone Diarrhea  . Papaya Enzyme Anaphylaxis    Metabolic Disorder Labs: No results found for: HGBA1C, MPG No results found for: PROLACTIN No results found for: CHOL, TRIG, HDL, CHOLHDL, VLDL, LDLCALC   Current Medications: Current Outpatient Prescriptions  Medication Sig Dispense Refill  . albuterol (PROAIR HFA) 108 (90 Base) MCG/ACT inhaler Inhale 2 puffs into the lungs every 6 (six) hours as needed.    . ALPRAZolam (XANAX) 0.5 MG tablet take 1 tablet by mouth twice a day if needed for anxiety 45 tablet 0   . amoxicillin-clavulanate (AUGMENTIN) 875-125 MG tablet Take 1 tablet by mouth 2 (two) times daily. For 10 days. 20 tablet 0  . balsalazide (COLAZAL) 750 MG capsule Take 2,250 mg by mouth 3 (three) times daily.    . Diclofenac Sodium (PENNSAID) 2 % SOLN 2 pumps twice a day application to painful area. 2 Bottle 1  . fluticasone (FLONASE) 50 MCG/ACT nasal spray Place into both nostrils daily.    . meloxicam (MOBIC) 15 MG tablet take 1 tablet by mouth once daily 30 tablet 1  . OLANZapine (ZYPREXA) 2.5 MG tablet Take 1 tablet (2.5 mg total) by mouth daily. 30 tablet 1  . traZODone (DESYREL) 50 MG tablet take 1 tablet by mouth at bedtime if needed for sleep 30 tablet 1  . varenicline (CHANTIX CONTINUING MONTH PAK) 1 MG tablet Take 1 tablet (1 mg total) by mouth 2 (two) times daily. 60 tablet 5   No current facility-administered medications for this visit.       Psychiatric Specialty Exam: Review of Systems  Cardiovascular: Negative for palpitations.  Musculoskeletal: Positive for joint pain.  Neurological: Negative for tremors and headaches.  Psychiatric/Behavioral: Negative for depression and suicidal ideas.    Blood pressure 116/70, pulse 75, resp. rate 16, height 5\' 8"  (1.727 m), weight 183 lb (83 kg), SpO2 95 %.Body mass index is 27.83 kg/m.  General Appearance: Casual  Eye Contact:  Fair  Speech:  Normal Rate  Volume:  Increased  Mood:  euthymic  Affect:  Congruent and Full Range  Thought Process:  Goal Directed  Orientation:  Full (Time, Place, and Person)  Thought Content:  Logical  Suicidal Thoughts:  No  Homicidal Thoughts:  No  Memory:  Immediate;   Fair Recent;   Fair  Judgement:  Fair  Insight:  Fair  Psychomotor Activity:  Normal  Concentration:  Concentration: Fair and Attention Span: Fair  Recall:  [employer] of Knowledge:Fair  Language: Fair  Akathisia:  Negative  Handed:  Right  AIMS (if indicated):    Assets:  Desire for Improvement  ADL's:  Intact   Cognition: WNL  Sleep:  Fair while on meds    Treatment Plan Summary: Medication management and Plan as follows  Bipolar 2: not worsened. Continue olanzapine 2.5mg  qhs GAd: xanax from primary care. Baseline Prescription sent for olanzapine.    Follow-up in 2 months or earlier if needed  Merian Capron, MD 12/14/201710:48 AM

## 2016-10-23 ENCOUNTER — Encounter: Payer: Self-pay | Admitting: Rehabilitative and Restorative Service Providers"

## 2016-10-23 ENCOUNTER — Ambulatory Visit (INDEPENDENT_AMBULATORY_CARE_PROVIDER_SITE_OTHER): Payer: Medicare Other | Admitting: Rehabilitative and Restorative Service Providers"

## 2016-10-23 DIAGNOSIS — M6281 Muscle weakness (generalized): Secondary | ICD-10-CM | POA: Diagnosis not present

## 2016-10-23 DIAGNOSIS — M25612 Stiffness of left shoulder, not elsewhere classified: Secondary | ICD-10-CM

## 2016-10-23 DIAGNOSIS — R293 Abnormal posture: Secondary | ICD-10-CM | POA: Diagnosis not present

## 2016-10-23 NOTE — Therapy (Signed)
[facility] [place], [place], [address] Phone: [phone]   Fax:  [phone]  Physical Therapy Treatment  Patient Details  Name: Paige Lozano MRN: [id number] Date of Birth: [birth year] Referring Provider: Dr Dina Rich  Encounter Date: 10/23/2016      PT End of Session - 10/23/16 1021    Visit Number 18   Number of Visits 20   Date for PT Re-Evaluation 10/27/17   PT Start Time 1019   PT Stop Time 1114   PT Time Calculation (min) 55 min   Activity Tolerance Patient tolerated treatment well      Past Medical History:  Diagnosis Date  . Anxiety   . Bipolar 2 disorder, major depressive episode ([place])   . Complication of anesthesia   . COPD (chronic obstructive pulmonary disease) ([place])    smoker  . Fibromyalgia   . Insomnia   . Multiple sclerosis ([place])   . PONV (postoperative nausea and vomiting)   . Pulmonary embolism ([place]) 2000  . Sleep apnea    mild OSA no CPAP  . Ulcerative colitis [facility])     Past Surgical History:  Procedure Laterality Date  . FOOT SURGERY Left    bone spur  . HEMORRHOID SURGERY    . HUMERUS FRACTURE SURGERY Left   . MYOMECTOMY    . NASAL SINUS SURGERY    . SHOULDER ARTHROSCOPY WITH SUBACROMIAL DECOMPRESSION AND BICEP TENDON REPAIR Left 07/27/2016   Procedure: SHOULDER ARTHROSCOPY DEBRIDEMENT ROTATOR CUFF AND LABRUM, SUBACROMIAL DECOMPRESSION, BICEPS TENODESIS;  Surgeon: Tania Ade, MD;  Location: [facility];  Service: Orthopedics;  Laterality: Left;  SHOULDER ARTHROSCOPY DEBRIDEMENT ROTATOR CUFF AND LABRUM, SUBACROMIAL DECOMPRESSION, BICEPS TENOTOMY, POSSIBLE TENODESIS  . THUMB ARTHROSCOPY     5 surgeries on left thumb, 4 surgeries on right  . TONSILLECTOMY AND ADENOIDECTOMY      There were no vitals filed for this visit.      Subjective Assessment - 10/23/16 1022    Subjective Conitnues to have pain in the Lt biceps area. Pain seems to be  intense. Not sure what causes the onset of pain. Occurs most in the evening when she is relaxing on the couch. Stretching seems to help but not immediately. Sees MD Monday 10/26/16.    Currently in Pain? Yes   Pain Score 4    Pain Location Shoulder   Pain Orientation Left;Anterior   Pain Descriptors / Indicators Tightness   Pain Onset More than a month ago   Pain Frequency Intermittent            [facility] PT Assessment - 10/23/16 0001      Assessment   Medical Diagnosis Lt shoulder scope, biceps tenotomy & open tenodesis   Referring Provider Dr Dina Rich   Onset Date/Surgical Date 07/27/16   Next MD Visit 10/26/16     AROM   Left Shoulder Extension 68 Degrees   Left Shoulder Flexion 170 Degrees   Left Shoulder ABduction 170 Degrees   Left Shoulder Internal Rotation 42 Degrees   Left Shoulder External Rotation 91 Degrees   Left Elbow Flexion 143   Left Elbow Extension 0   Cervical Flexion WNL   Cervical Extension WNL   Cervical - Right Side Bend WNL   Cervical - Left Side Bend WNL   Cervical - Right Rotation WNL   Cervical - Left Rotation WNL     PROM   Left Shoulder Flexion 170 Degrees   Left Shoulder  ABduction 170 Degrees   Left Shoulder Internal Rotation 88 Degrees   Left Shoulder External Rotation 94 Degrees     Strength   Left Shoulder Flexion 4+/5   Left Shoulder Extension --  5-/5   Left Shoulder ABduction 4+/5   Left Shoulder Internal Rotation --  5-/5   Left Shoulder External Rotation 4+/5     Palpation   Palpation comment tenderness and muscular banding/knotted area Lt biceps prominent proximal to proximal incision                      [facility] Adult PT Treatment/Exercise - 10/23/16 0001      Lumbar Exercises: Aerobic   UBE (Upper Arm Bike) L3 X4 min alt fwd/back     Shoulder Exercises: Supine   Other Supine Exercises PT assisted stretch on edge of table - shd extension/elbow extension 30 sec x 2; horizontal abduction 30 sec x 3       Shoulder Exercises: Standing   Other Standing Exercises scap squeeze 10 sec x 10      Shoulder Exercises: Stretch   Other Shoulder Stretches Standing Lt shoulder ext  stretch x 30 sec x 2 reps   Other Shoulder Stretches doorway stretch 3 positions 30 sec x 2      Moist Heat Therapy   Number Minutes Moist Heat 15 Minutes   Moist Heat Location Shoulder  Lt biceps      Electrical Stimulation   Electrical Stimulation Location Lt shoulder, bicep   Electrical Stimulation Action IFC   Electrical Stimulation Parameters  to tolerance   Electrical Stimulation Goals Pain;Tone     Ultrasound   Ultrasound Location Lt anterior arm through area of TP/tightness Lt biceps    Ultrasound Parameters 1.0 w/cm2; 100%; 1 mHz; 10 min    Ultrasound Goals Pain;Other (Comment)  muscular tightness     Manual Therapy   Manual therapy comments pt supine   Joint Mobilization Lt GH joint   Soft tissue mobilization Lt anterior arm/biceps  tolerating increased pressure today    Myofascial Release Lt ant arm/biceps - light pressure    Kinesiotex Inhibit Muscle;Create Space  star pattern over area of banding/tightness at biceps                      PT Long Term Goals - 10/23/16 1229      PT LONG TERM GOAL #1   Title I with advanced HEP to include water classes ( 11/27/16)    Time 12   Period Weeks   Status On-going     PT LONG TERM GOAL #2   Title demo Lt shoulder ROM WFL ( 09/22/16)    Time 6   Period Weeks   Status Achieved     PT LONG TERM GOAL #3   Title increase strength Lt UE to 5-/5 to 5/5 throughout 11/27/16   Time 6   Period Weeks   Status On-going     PT LONG TERM GOAL #4   Title improve FOTO =/< 44% limited ( 09/22/16)    Time 6   Period Weeks   Status Achieved     PT LONG TERM GOAL #5   Title Walk and play with dogs without difficulty ( 11/27/16)    Time 12   Period Weeks   Status On-going               Plan - 10/23/16 1059    Clinical Impression  Statement  continued intermittent pain and tightness anterior Lt arm proximal to incision. Area of banding and tightness noted in this tissue. Note decreased tightness and tenderness with manual work and modalities. Trial of taping to lift tissue and improve circulation. Myofacial tightness and restrictions from surgeries and trauma possibly the cause of persistent pain and tightness in this area. Good progress with ROM and strength. Progressing well toward stated goals of therapy. Will benefit from continued treatment to address myofacial pain and progress with strengthening.    Rehab Potential Excellent   PT Frequency 2x / week   PT Duration 6 weeks   PT Treatment/Interventions Moist Heat;Ultrasound;Therapeutic exercise;Dry needling;Taping;Vasopneumatic Device;Manual techniques;Cryotherapy;Electrical Stimulation;Passive range of motion;Patient/family education   PT Next Visit Plan continue UE strengthening. Manual and modalities to address myofacial pain and tightness    Consulted and Agree with Plan of Care Patient      Patient will benefit from skilled therapeutic intervention in order to improve the following deficits and impairments:  Postural dysfunction, Increased edema, Decreased strength, Pain, Impaired UE functional use, Increased muscle spasms  Visit Diagnosis: Stiffness of left shoulder, not elsewhere classified  Muscle weakness (generalized)  Abnormal posture     Problem List Patient Active Problem List   Diagnosis Date Noted  . History of pulmonary embolus (PE) 07/21/2016  . Right elbow pain 07/21/2016  . Right lateral epicondylitis 07/21/2016  . GAD (generalized anxiety disorder) 07/20/2016  . 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 Anxiety state 06/01/2016  . Left shoulder pain 06/01/2016  . Multiple sclerosis ([place]) 05/29/2016  . Fibromyalgia 05/29/2016  . 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 Insomnia 05/29/2016  . Ulcerative pancolitis without  complication ([place]) [id number]  . Bipolar 2 disorder ([place]) 05/29/2016  . Current smoker 05/29/2016    Amani Nodarse Nilda Simmer PT, MPH  10/23/2016, 12:31 PM  [facility] [place], [place], [address] Phone: [phone]   Fax:  [phone]  Name: Paige Lozano MRN: [id number] Date of Birth: 1965-12-23

## 2016-10-26 ENCOUNTER — Telehealth: Payer: Self-pay | Admitting: Sports Medicine

## 2016-10-26 DIAGNOSIS — S43432D Superior glenoid labrum lesion of left shoulder, subsequent encounter: Secondary | ICD-10-CM | POA: Diagnosis not present

## 2016-10-26 DIAGNOSIS — M25561 Pain in right knee: Principal | ICD-10-CM

## 2016-10-26 DIAGNOSIS — G8929 Other chronic pain: Secondary | ICD-10-CM

## 2016-10-26 NOTE — Telephone Encounter (Signed)
MRI ordered

## 2016-10-26 NOTE — Telephone Encounter (Signed)
Pt called in inquiring about getting an MRI done on her right knee. Please advise. Thanks!

## 2016-10-28 ENCOUNTER — Encounter: Payer: Self-pay | Admitting: Rehabilitative and Restorative Service Providers"

## 2016-10-28 ENCOUNTER — Ambulatory Visit (INDEPENDENT_AMBULATORY_CARE_PROVIDER_SITE_OTHER): Payer: Medicare Other | Admitting: Rehabilitative and Restorative Service Providers"

## 2016-10-28 DIAGNOSIS — M6281 Muscle weakness (generalized): Secondary | ICD-10-CM | POA: Diagnosis not present

## 2016-10-28 DIAGNOSIS — R293 Abnormal posture: Secondary | ICD-10-CM | POA: Diagnosis not present

## 2016-10-28 DIAGNOSIS — M25512 Pain in left shoulder: Secondary | ICD-10-CM

## 2016-10-28 DIAGNOSIS — G8929 Other chronic pain: Secondary | ICD-10-CM | POA: Diagnosis not present

## 2016-10-28 DIAGNOSIS — M25612 Stiffness of left shoulder, not elsewhere classified: Secondary | ICD-10-CM

## 2016-10-28 NOTE — Therapy (Signed)
[place], [place], [address] Phone: [phone]   Fax:  [phone]  Physical Therapy Treatment  Patient Details  Name: Paige Lozano MRN: [id number] Date of Birth: 1966-10-14 Referring Provider: Dr Tania Ade  Encounter Date: 10/28/2016      PT End of Session - 10/28/16 0853    Visit Number 19   Number of Visits 20   Date for PT Re-Evaluation 10/27/17   Authorization Time Period G-code at visit 30   PT Start Time 0848   PT Stop Time 0945   PT Time Calculation (min) 57 min   Activity Tolerance Patient tolerated treatment well      Past Medical History:  Diagnosis Date  . Anxiety   . Bipolar 2 disorder, major depressive episode ([place])   . Complication of anesthesia   . COPD (chronic obstructive pulmonary disease) ([place])    smoker  . Fibromyalgia   . Insomnia   . Multiple sclerosis ([place])   . PONV (postoperative nausea and vomiting)   . Pulmonary embolism ([place]) 2000  . Sleep apnea    mild OSA no CPAP  . Ulcerative colitis [facility])     Past Surgical History:  Procedure Laterality Date  . FOOT SURGERY Left    bone spur  . HEMORRHOID SURGERY    . HUMERUS FRACTURE SURGERY Left   . MYOMECTOMY    . NASAL SINUS SURGERY    . SHOULDER ARTHROSCOPY WITH SUBACROMIAL DECOMPRESSION AND BICEP TENDON REPAIR Left 07/27/2016   Procedure: SHOULDER ARTHROSCOPY DEBRIDEMENT ROTATOR CUFF AND LABRUM, SUBACROMIAL DECOMPRESSION, BICEPS TENODESIS;  Surgeon: Tania Ade, MD;  Location: [facility];  Service: Orthopedics;  Laterality: Left;  SHOULDER ARTHROSCOPY DEBRIDEMENT ROTATOR CUFF AND LABRUM, SUBACROMIAL DECOMPRESSION, BICEPS TENOTOMY, POSSIBLE TENODESIS  . THUMB ARTHROSCOPY     5 surgeries on left thumb, 4 surgeries on right  . TONSILLECTOMY AND ADENOIDECTOMY      There were no vitals filed for this visit.      Subjective Assessment - 10/28/16 0853    Subjective Saw surgeon  Monday afternoon. he states that she should not be having as much pain as she is this far out of surgery. He gave her an injection and stated that she should continue with exercise put avoid additional weights and no TDN if there is pain with the needling. He is pleased with the ROM but concerned that she may have injured the shoulder when she ran into the wall. He wants her to continue with [place] and manual work as well as stretching. Patient reports that the tape started "burning" at the incision area and she took it off the following day.    Currently in Pain? Yes   Pain Score 4    Pain Location Shoulder   Pain Orientation Left;Anterior   Pain Descriptors / Indicators Tightness;Burning   Pain Type Chronic pain   Pain Onset More than a month ago            [facility] PT Assessment - 10/28/16 0001      Assessment   Medical Diagnosis Lt shoulder scope, biceps tenotomy & open tenodesis   Referring Provider Dr Tania Ade   Onset Date/Surgical Date 07/27/16   Next MD Visit 1/18     Precautions   Precautions --  avoid increase in resistive exercise - cont current program     Observation/Other Assessments   Focus on Therapeutic Outcomes (FOTO)  46% limitation      Sensation  Additional Comments hypersentivite to touch proximal incisioin Lt shoudler and area of banding tightness proximal biceps area      AROM   Left Shoulder Extension 68 Degrees   Left Shoulder Flexion 170 Degrees   Left Shoulder ABduction 170 Degrees   Left Shoulder Internal Rotation 42 Degrees   Left Shoulder External Rotation 91 Degrees   Left Elbow Flexion 143   Left Elbow Extension 0   Cervical Flexion WNL   Cervical Extension WNL   Cervical - Right Side Bend WNL   Cervical - Left Side Bend WNL   Cervical - Right Rotation WNL   Cervical - Left Rotation WNL     PROM   Left Shoulder Flexion 170 Degrees   Left Shoulder ABduction 170 Degrees   Left Shoulder Internal Rotation 88 Degrees   Left Shoulder  External Rotation 94 Degrees     Strength   Left Shoulder Flexion 4+/5   Left Shoulder Extension --  5-/5   Left Shoulder ABduction 4+/5   Left Shoulder Internal Rotation --  5-/5   Left Shoulder External Rotation 4+/5     Palpation   Palpation comment tenderness and muscular banding/knotted area Lt biceps prominent proximal to proximal incision                      [facility] Adult PT Treatment/Exercise - 10/28/16 0001      Lumbar Exercises: Aerobic   UBE (Upper Arm Bike) L3 X4 min alt fwd/back     Shoulder Exercises: Stretch   Other Shoulder Stretches Standing Lt shoulder ext  stretch x 30 sec x 2 reps   Other Shoulder Stretches doorway stretch 3 positions 30 sec x 2      Moist Heat Therapy   Number Minutes Moist Heat 15 Minutes   Moist Heat Location Shoulder  Lt biceps      Electrical Stimulation   Electrical Stimulation Location Lt shoudler    Electrical Stimulation Action IFC   Electrical Stimulation Parameters to tolerance   Electrical Stimulation Goals Pain;Tone     Ultrasound   Ultrasound Location Lt anterior arm/incision at biceps and proximal incision area   Ultrasound Parameters 1/5w/cm2; 100%; 3.3 mHz; 8 min    Ultrasound Goals Pain;Other (Comment)  muscular tightness     Manual Therapy   Manual therapy comments pt supine   Joint Mobilization Lt GH joint   Soft tissue mobilization Lt anterior arm/biceps - scar massage to proximal incision Lt anterior shoudler   tolerating increased pressure today    Myofascial Release Lt ant arm/biceps - light pressure    Passive ROM Lt shoulder ER/IR/extension with elbow extension stretching to anterior arm/scar   Kinesiotex Inhibit Muscle;Create Space  star pattern over area of banding/tightness at biceps                      PT Long Term Goals - 10/23/16 1229      PT LONG TERM GOAL #1   Title I with advanced HEP to include water classes ( 11/27/16)    Time 12   Period Weeks   Status  On-going     PT LONG TERM GOAL #2   Title demo Lt shoulder ROM WFL ( 09/22/16)    Time 6   Period Weeks   Status Achieved     PT LONG TERM GOAL #3   Title increase strength Lt UE to 5-/5 to 5/5 throughout 11/27/16   Time 6  Period Weeks   Status On-going     PT LONG TERM GOAL #4   Title improve FOTO =/< 44% limited ( 09/22/16)    Time 6   Period Weeks   Status Achieved     PT LONG TERM GOAL #5   Title Walk and play with dogs without difficulty ( 11/27/16)    Time 12   Period Weeks   Status On-going             Patient will benefit from skilled therapeutic intervention in order to improve the following deficits and impairments:     Visit Diagnosis: Stiffness of left shoulder, not elsewhere classified  Muscle weakness (generalized)  Abnormal posture  Chronic left shoulder pain     Problem List Patient Active Problem List   Diagnosis Date Noted  . History of pulmonary embolus (PE) 07/21/2016  . Right elbow pain 07/21/2016  . Right lateral epicondylitis 07/21/2016  . GAD (generalized anxiety disorder) 07/20/2016  . 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 Anxiety state 06/01/2016  . Left shoulder pain 06/01/2016  . Multiple sclerosis ([place]) 05/29/2016  . Fibromyalgia 05/29/2016  . 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 Insomnia 05/29/2016  . Ulcerative pancolitis without complication ([place]) [id number]  . Bipolar 2 disorder ([place]) 05/29/2016  . Current smoker 05/29/2016    Agastya Meister Nilda Simmer PT, MPH  10/28/2016, 10:03 AM  [facility] [place], [place], [address] Phone: [phone]   Fax:  [phone]  Name: Leniah Willette MRN: [id number] Date of Birth: 09/14/1966

## 2016-11-10 ENCOUNTER — Other Ambulatory Visit: Payer: Self-pay

## 2016-11-10 ENCOUNTER — Other Ambulatory Visit: Payer: Self-pay | Admitting: Family Medicine

## 2016-11-11 ENCOUNTER — Encounter: Payer: Self-pay | Admitting: Rehabilitative and Restorative Service Providers"

## 2016-11-12 ENCOUNTER — Ambulatory Visit (INDEPENDENT_AMBULATORY_CARE_PROVIDER_SITE_OTHER): Payer: Medicare Other | Admitting: Physical Therapy

## 2016-11-12 ENCOUNTER — Encounter: Payer: Self-pay | Admitting: Physical Therapy

## 2016-11-12 ENCOUNTER — Other Ambulatory Visit: Payer: Self-pay | Admitting: *Deleted

## 2016-11-12 DIAGNOSIS — M25512 Pain in left shoulder: Secondary | ICD-10-CM | POA: Diagnosis not present

## 2016-11-12 DIAGNOSIS — G8929 Other chronic pain: Secondary | ICD-10-CM

## 2016-11-12 DIAGNOSIS — R293 Abnormal posture: Secondary | ICD-10-CM

## 2016-11-12 DIAGNOSIS — M6281 Muscle weakness (generalized): Secondary | ICD-10-CM | POA: Diagnosis not present

## 2016-11-12 DIAGNOSIS — M25612 Stiffness of left shoulder, not elsewhere classified: Secondary | ICD-10-CM | POA: Diagnosis present

## 2016-11-12 MED ORDER — ZOLPIDEM TARTRATE 10 MG PO TABS: 10.0000 mg | ORAL_TABLET | Freq: Every evening | ORAL | 1 refills | Status: DC | PRN

## 2016-11-12 NOTE — Therapy (Signed)
[place], [place], [address] Phone: [phone]   Fax:  [phone]  Physical Therapy Treatment  Patient Details  Name: Paige Lozano MRN: [id number] Date of Birth: 12-07-1965 Referring Provider: Dr. Tania Ade  Encounter Date: 11/12/2016      PT End of Session - 11/12/16 1453    Visit Number 20   Number of Visits 24   Date for PT Re-Evaluation 11/27/16   Authorization Time Period G-code at visit 50   PT Start Time 1456   PT Stop Time 1558   PT Time Calculation (min) 62 min   Activity Tolerance Patient tolerated treatment well      Past Medical History:  Diagnosis Date  . Anxiety   . Bipolar 2 disorder, major depressive episode ([place])   . Complication of anesthesia   . COPD (chronic obstructive pulmonary disease) ([place])    smoker  . Fibromyalgia   . Insomnia   . Multiple sclerosis ([place])   . PONV (postoperative nausea and vomiting)   . Pulmonary embolism ([place]) 2000  . Sleep apnea    mild OSA no CPAP  . Ulcerative colitis [facility])     Past Surgical History:  Procedure Laterality Date  . FOOT SURGERY Left    bone spur  . HEMORRHOID SURGERY    . HUMERUS FRACTURE SURGERY Left   . MYOMECTOMY    . NASAL SINUS SURGERY    . SHOULDER ARTHROSCOPY WITH SUBACROMIAL DECOMPRESSION AND BICEP TENDON REPAIR Left 07/27/2016   Procedure: SHOULDER ARTHROSCOPY DEBRIDEMENT ROTATOR CUFF AND LABRUM, SUBACROMIAL DECOMPRESSION, BICEPS TENODESIS;  Surgeon: Tania Ade, MD;  Location: [facility];  Service: Orthopedics;  Laterality: Left;  SHOULDER ARTHROSCOPY DEBRIDEMENT ROTATOR CUFF AND LABRUM, SUBACROMIAL DECOMPRESSION, BICEPS TENOTOMY, POSSIBLE TENODESIS  . THUMB ARTHROSCOPY     5 surgeries on left thumb, 4 surgeries on right  . TONSILLECTOMY AND ADENOIDECTOMY      There were no vitals filed for this visit.      Subjective Assessment - 11/12/16 1457    Subjective Paige Lozano reports she  was on vacation, played hard with a 51 yo and didn't have any problems in the shoulder.   Went to work yesterday and the lump returned and the pain is back.  Works at [employer] and her last day is tomorrow, she starts her new job then . She is concerned that when she returns to strength training the symptoms will return.    Patient Stated Goals return to swimming, water classes, walk dogs.             [facility] PT Assessment - 11/12/16 0001      Assessment   Medical Diagnosis Lt shoulder scope, biceps tenotomy & open tenodesis   Referring Provider Dr. Tania Ade   Onset Date/Surgical Date 07/27/16   Hand Dominance Right   Next MD Visit 1/18                     [facility] Adult PT Treatment/Exercise - 11/12/16 0001      Shoulder Exercises: ROM/Strengthening   UBE (Upper Arm Bike) L3 x 6 mins alt fwd/bkwd     Moist Heat Therapy   Number Minutes Moist Heat 15 Minutes   Moist Heat Location Shoulder  Lt      Electrical Stimulation   Electrical Stimulation Location Lt shoulder   Electrical Stimulation Action IFC   Electrical Stimulation Parameters  to pt tolerance    Electrical Stimulation  Goals Pain;Tone     Ultrasound   Ultrasound Location Lt ant shoulder/bicep    Ultrasound Parameters 1.5w/cm2. 3.28mHz 100%    Ultrasound Goals Pain;Other (Comment)  muscular tightness     Manual Therapy   Manual therapy comments pt supine   Soft tissue mobilization Lt anterior arm/biceps - scar massage to proximal incision Lt anterior shoudler    Myofascial Release Lt bicep, long head and musculotendinous junction   Kinesiotex Inhibit Muscle;Create Space  star pattern over area of banding/tightness at deltoid tub.     Kinesiotix   Facilitate Muscle  I strip applied to posterior deltoid with 15% stretch.                      PT Long Term Goals - 10/23/16 1229      PT LONG TERM GOAL #1   Title I with advanced HEP to include water classes ( 11/27/16)    Time 12    Period Weeks   Status On-going     PT LONG TERM GOAL #2   Title demo Lt shoulder ROM WFL ( 09/22/16)    Time 6   Period Weeks   Status Achieved     PT LONG TERM GOAL #3   Title increase strength Lt UE to 5-/5 to 5/5 throughout 11/27/16   Time 6   Period Weeks   Status On-going     PT LONG TERM GOAL #4   Title improve FOTO =/< 44% limited ( 09/22/16)    Time 6   Period Weeks   Status Achieved     PT LONG TERM GOAL #5   Title Walk and play with dogs without difficulty ( 11/27/16)    Time 12   Period Weeks   Status On-going               Plan - 11/12/16 1612    Clinical Impression Statement Paige Lozano did very well when she was on vacation, was even wondering if things were "cured".  she has been back at work one day and the bulge and pain has returned.  She is finishing up at that job and starting another this week.  Her big concern is she wishes to be able to perform her workouts without pain and restrctions.    Rehab Potential Good   PT Frequency 1x / week   PT Duration 4 weeks   PT Treatment/Interventions Moist Heat;Ultrasound;Therapeutic exercise;Dry needling;Taping;Vasopneumatic Device;Manual techniques;Cryotherapy;Electrical Stimulation;Passive range of motion;Patient/family education   PT Next Visit Plan manual work, stretching & [place]   Consulted and Agree with Plan of Care Patient      Patient will benefit from skilled therapeutic intervention in order to improve the following deficits and impairments:  Postural dysfunction, Increased edema, Decreased strength, Pain, Impaired UE functional use, Increased muscle spasms  Visit Diagnosis: Stiffness of left shoulder, not elsewhere classified  Muscle weakness (generalized)  Abnormal posture  Chronic left shoulder pain     Problem List Patient Active Problem List   Diagnosis Date Noted  . History of pulmonary embolus (PE) 07/21/2016  . Right elbow pain 07/21/2016  . Right lateral epicondylitis 07/21/2016  .  GAD (generalized anxiety disorder) 07/20/2016  . 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 Anxiety state 06/01/2016  . Left shoulder pain 06/01/2016  . Multiple sclerosis ([place]) 05/29/2016  . Fibromyalgia 05/29/2016  . 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 Insomnia 05/29/2016  . Ulcerative pancolitis without complication ([place]) [id number]  . Bipolar 2  disorder ([employer]) 05/29/2016  . Current smoker 05/29/2016    Boneta Lucks rPT  11/12/2016, 4:15 PM  [facility] [place], [place], [address] Phone: [phone]   Fax:  [phone]  Name: Aricela Torrealba MRN: [id number] Date of Birth: 04-Jun-1966

## 2016-11-13 ENCOUNTER — Encounter: Payer: Self-pay | Admitting: Rehabilitative and Restorative Service Providers"

## 2016-11-16 ENCOUNTER — Ambulatory Visit (INDEPENDENT_AMBULATORY_CARE_PROVIDER_SITE_OTHER): Payer: Medicare Other

## 2016-11-16 DIAGNOSIS — M25561 Pain in right knee: Secondary | ICD-10-CM

## 2016-11-16 DIAGNOSIS — G8929 Other chronic pain: Secondary | ICD-10-CM | POA: Diagnosis not present

## 2016-11-17 ENCOUNTER — Encounter: Payer: Self-pay | Admitting: Rehabilitative and Restorative Service Providers"

## 2016-11-17 ENCOUNTER — Ambulatory Visit (INDEPENDENT_AMBULATORY_CARE_PROVIDER_SITE_OTHER): Payer: Medicare Other | Admitting: Rehabilitative and Restorative Service Providers"

## 2016-11-17 ENCOUNTER — Ambulatory Visit (INDEPENDENT_AMBULATORY_CARE_PROVIDER_SITE_OTHER): Payer: Medicare Other | Admitting: Licensed Clinical Social Worker

## 2016-11-17 DIAGNOSIS — F4310 Post-traumatic stress disorder, unspecified: Secondary | ICD-10-CM | POA: Diagnosis not present

## 2016-11-17 DIAGNOSIS — M6281 Muscle weakness (generalized): Secondary | ICD-10-CM | POA: Diagnosis not present

## 2016-11-17 DIAGNOSIS — M25612 Stiffness of left shoulder, not elsewhere classified: Secondary | ICD-10-CM | POA: Diagnosis present

## 2016-11-17 DIAGNOSIS — F411 Generalized anxiety disorder: Secondary | ICD-10-CM

## 2016-11-17 DIAGNOSIS — R293 Abnormal posture: Secondary | ICD-10-CM

## 2016-11-17 DIAGNOSIS — M25512 Pain in left shoulder: Secondary | ICD-10-CM

## 2016-11-17 DIAGNOSIS — F3181 Bipolar II disorder: Secondary | ICD-10-CM | POA: Diagnosis not present

## 2016-11-17 DIAGNOSIS — G8929 Other chronic pain: Secondary | ICD-10-CM | POA: Diagnosis not present

## 2016-11-17 NOTE — Therapy (Signed)
[facility] [place] [address] [place], [place], [address] Phone: [phone]   Fax:  [phone]  Physical Therapy Treatment  Patient Details  Name: Paige Lozano MRN: [id number] Date of Birth: 06-08-1966 Referring Provider: Dr. Jones Broom  Encounter Date: 11/17/2016      PT End of Session - 11/17/16 1023    Visit Number 21   Number of Visits 24   Date for PT Re-Evaluation 11/27/16   PT Start Time 1020   PT Stop Time 1102   PT Time Calculation (min) 42 min   Activity Tolerance Patient tolerated treatment well      Past Medical History:  Diagnosis Date  . Anxiety   . Bipolar 2 disorder, major depressive episode (HCC)   . Complication of anesthesia   . COPD (chronic obstructive pulmonary disease) (HCC)    smoker  . Fibromyalgia   . Insomnia   . Multiple sclerosis (HCC)   . PONV (postoperative nausea and vomiting)   . Pulmonary embolism (HCC) 2000  . Sleep apnea    mild OSA no CPAP  . Ulcerative colitis [facility])     Past Surgical History:  Procedure Laterality Date  . FOOT SURGERY Left    bone spur  . HEMORRHOID SURGERY    . HUMERUS FRACTURE SURGERY Left   . MYOMECTOMY    . NASAL SINUS SURGERY    . SHOULDER ARTHROSCOPY WITH SUBACROMIAL DECOMPRESSION AND BICEP TENDON REPAIR Left 07/27/2016   Procedure: SHOULDER ARTHROSCOPY DEBRIDEMENT ROTATOR CUFF AND LABRUM, SUBACROMIAL DECOMPRESSION, BICEPS TENODESIS;  Surgeon: Jones Broom, MD;  Location: [facility];  Service: Orthopedics;  Laterality: Left;  SHOULDER ARTHROSCOPY DEBRIDEMENT ROTATOR CUFF AND LABRUM, SUBACROMIAL DECOMPRESSION, BICEPS TENOTOMY, POSSIBLE TENODESIS  . THUMB ARTHROSCOPY     5 surgeries on left thumb, 4 surgeries on right  . TONSILLECTOMY AND ADENOIDECTOMY      There were no vitals filed for this visit.      Subjective Assessment - 11/17/16 1024    Subjective Still working at the [employer] for a couple more days. Can  tell that really irritates symptoms. Does not see MD for 2.5 weeks. Wants to be able to use the Lt UE in all activities of daily living and with exercise. Has has an excerbation of MS so she has had less activitiy in general in the past few days. Therapy seems to help with pain and the tape does help for a couple of days.    Currently in Pain? Yes   Pain Score 3    Pain Location Shoulder   Pain Orientation Left;Anterior   Pain Descriptors / Indicators Tightness;Burning   Pain Type Chronic pain   Pain Onset More than a month ago   Pain Frequency Intermittent                         [facility] Adult PT Treatment/Exercise - 11/17/16 0001      Shoulder Exercises: Supine   Other Supine Exercises stretch on edge of table - shd extension/elbow extension 30 sec x 2; horizontal abduction 30 sec x 3      Shoulder Exercises: ROM/Strengthening   UBE (Upper Arm Bike) L3 x 6 mins alt fwd/bkwd     Shoulder Exercises: Stretch   Other Shoulder Stretches Standing Lt shoulder ext  stretch x 30 sec x 2 reps   Other Shoulder Stretches doorway stretch 3 positions 30 sec x 2      Moist  Heat Therapy   Number Minutes Moist Heat 10 Minutes   Moist Heat Location Shoulder  Lt      Electrical Stimulation   Electrical Stimulation Location Lt shoulder   Electrical Stimulation Action IFC   Electrical Stimulation Parameters to tolerance   Electrical Stimulation Goals Pain;Tone     Ultrasound   Ultrasound Location Anterior Lt shoulder/arm through biceps area    Ultrasound Parameters 1.5 w/cm2; 1 mHz; 100%; 8 min    Ultrasound Goals Pain;Other (Comment)  muscular tightness     Manual Therapy   Manual therapy comments pt supine   Soft tissue mobilization Lt anterior arm/biceps - scar massage to proximal incision Lt anterior shoudler   increased tolerance to pressure    Myofascial Release Lt bicep, long head and musculotendinous junction   Kinesiotex Inhibit Muscle;Create Space  star pattern  over area of banding/tightness at deltoid tub.                PT Education - 11/17/16 1028    Education provided Yes   Education Details encouraged continued consistent stretching and avoiding overuse   Person(s) Educated Patient   Methods Explanation   Comprehension Verbalized understanding             PT Long Term Goals - 11/17/16 1028      PT LONG TERM GOAL #1   Title I with advanced HEP to include water classes ( 11/27/16)    Time 12   Period Weeks   Status On-going     PT LONG TERM GOAL #2   Title demo Lt shoulder ROM WFL ( 09/22/16)    Time 6   Period Weeks   Status Achieved     PT LONG TERM GOAL #3   Title increase strength Lt UE to 5-/5 to 5/5 throughout 11/27/16   Time 6   Period Weeks   Status On-going     PT LONG TERM GOAL #4   Title improve FOTO =/< 44% limited ( 09/22/16)    Time 6   Period Weeks   Status Achieved     PT LONG TERM GOAL #5   Title Walk and play with dogs without difficulty ( 11/27/16)    Time 12   Period Weeks   Status Partially Met               Plan - 11/17/16 1029    Clinical Impression Statement Continue to encourage patient to avoid activities that irritate symptoms - allowing tissues to heal. Continue with stretching; modalities; manual work; taping.    Rehab Potential Good   PT Frequency 1x / week   PT Duration 4 weeks   PT Treatment/Interventions Moist Heat;Ultrasound;Therapeutic exercise;Dry needling;Taping;Vasopneumatic Device;Manual techniques;Cryotherapy;Electrical Stimulation;Passive range of motion;Patient/family education   PT Next Visit Plan manual work, stretching & [place]   Consulted and Agree with Plan of Care Patient      Patient will benefit from skilled therapeutic intervention in order to improve the following deficits and impairments:  Postural dysfunction, Increased edema, Decreased strength, Pain, Impaired UE functional use, Increased muscle spasms  Visit Diagnosis: Stiffness of left  shoulder, not elsewhere classified  Muscle weakness (generalized)  Abnormal posture  Chronic left shoulder pain     Problem List Patient Active Problem List   Diagnosis Date Noted  . History of pulmonary embolus (PE) 07/21/2016  . Right elbow pain 07/21/2016  . Right lateral epicondylitis 07/21/2016  . GAD (generalized anxiety disorder) 07/20/2016  . Chondromalacia of right patellofemoral  joint 06/01/2016  . Anxiety state 06/01/2016  . Left shoulder pain 06/01/2016  . Multiple sclerosis ([place]) 05/29/2016  . Fibromyalgia 05/29/2016  . 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 Insomnia 05/29/2016  . Ulcerative pancolitis without complication ([place]) 82/95/6213  . Bipolar 2 disorder ([place]) 05/29/2016  . Current smoker 05/29/2016    Riata Ikeda Nilda Simmer PT< MPH  11/17/2016, [place], [place], [address] Phone: [phone]   Fax:  [phone]  Name: Shaun Zuccaro MRN: [id number] Date of Birth: [birth year]

## 2016-11-17 NOTE — Progress Notes (Signed)
   THERAPIST PROGRESS NOTE  Session Time: 11 AM to 11:55 AM  Participation Level: Active  Behavioral Response: CasualAlertEuthymic  Type of Therapy: Individual Therapy  Treatment Goals addressed: continue to make positive strides in motivation and attitude, decrease in anxiety,and learning and applying positive coping strategies to cope with anxiety, decrease in PTSD symptoms  Interventions: Solution Focused, Strength-based, Supportive and Other: Healthy relationship skills  Summary: Oonagh Toellner is a 51 y.o. female who presents with sharing that she has learned a lot from the past year and described one thing as being more appreciative of less than more. Reviewed her feelings of friend who lost a box with her things and has given her a "pass" and letting it be. It is draining so she will remove herself until she asks for help or gets help. She still loves her, hurt knowing the path she is on but needs to step away as the negativity and disappointment have too much of an impact. Discussed recent romantic relationships and her need to be in a healthy relationship. Discussed being working on being in "healthy headspace" to start new job, looking forward to starting at the [employer] and routine will help her with her well-being. Discussed problems with PTSD and refills issues of past have to be worked through and described that she carries the past like "a chain ball". She was emotional with flaring of medical issues this weekend and was reminiscing. Her ex-husband called and told her he had to put down the first dog they got together and this opened a "Pandora box". It open that part of her life and wants that part of her life again. They are still talking about being in relationship, patient is willing to wait till he is ready, and she plans to stay focused on enjoying her life until their relationship is restarted. Describes spirituality as a helpful tool for her and coping.   Suicidal/Homicidal:  No  Therapist Response: Reviewed patient's progress and symptoms,. Helped patient process feelings around recent relationships and identified her need to take care of herself to keep herself in a healthy place. She is going to take a step back and remove herself from an environment of negativity and disappointment. Described how boundaries help one to be healthy in relationships. Identified patient getting back into a routine as helping her to maintain a healthy attitude. Identified PTSD as a focus of treatment and ways that patient carries the past around with her and her present life and help patient process through grief and loss related to her relationship with husband. Helped in reframing to focus on their present and future relationship. Reinforce patient's positive attitude of recognition of being able to enjoy her life now. Identified spirituality as a coping strategy and utilized strength based and supportive interventions.    Plan: Return again in 1 weeks.2. Patient continue to work on insight to coping skills to help with attitude, anxiety, PTSD symptoms and apply and life situations  Diagnosis: Axis I: bipolar 2 disorder, most recent episode depressed, PTSD, generalized anxiety disorder    Axis II: No diagnosis    Bowman,Mary A, LCSW 11/17/2016

## 2016-11-18 ENCOUNTER — Other Ambulatory Visit: Payer: Self-pay | Admitting: Physician Assistant

## 2016-11-19 ENCOUNTER — Encounter: Payer: Self-pay | Admitting: Physical Therapy

## 2016-11-23 ENCOUNTER — Other Ambulatory Visit: Payer: Self-pay | Admitting: Physician Assistant

## 2016-11-23 ENCOUNTER — Telehealth: Payer: Self-pay | Admitting: *Deleted

## 2016-11-23 DIAGNOSIS — G35 Multiple sclerosis: Secondary | ICD-10-CM

## 2016-11-23 DIAGNOSIS — M797 Fibromyalgia: Secondary | ICD-10-CM

## 2016-11-23 DIAGNOSIS — M1810 Unilateral primary osteoarthritis of first carpometacarpal joint, unspecified hand: Secondary | ICD-10-CM

## 2016-11-23 DIAGNOSIS — M25521 Pain in right elbow: Secondary | ICD-10-CM

## 2016-11-23 DIAGNOSIS — M25512 Pain in left shoulder: Secondary | ICD-10-CM

## 2016-11-23 DIAGNOSIS — M2241 Chondromalacia patellae, right knee: Secondary | ICD-10-CM

## 2016-11-23 NOTE — Telephone Encounter (Signed)
Xanax was sent on Friday. Will make referral to pain clinic. Please call and let patient know.

## 2016-11-23 NOTE — Telephone Encounter (Signed)
Mrs. Winand needs her medication called in to her pharmacy (alprazolam) She would like a request for a pain clinic(referral)

## 2016-11-24 ENCOUNTER — Ambulatory Visit (INDEPENDENT_AMBULATORY_CARE_PROVIDER_SITE_OTHER): Payer: Medicare Other | Admitting: Licensed Clinical Social Worker

## 2016-11-24 ENCOUNTER — Telehealth (HOSPITAL_COMMUNITY): Payer: Self-pay | Admitting: Psychiatry

## 2016-11-24 DIAGNOSIS — F4310 Post-traumatic stress disorder, unspecified: Secondary | ICD-10-CM

## 2016-11-24 DIAGNOSIS — F3181 Bipolar II disorder: Secondary | ICD-10-CM

## 2016-11-24 DIAGNOSIS — F411 Generalized anxiety disorder: Secondary | ICD-10-CM

## 2016-11-24 MED ORDER — OLANZAPINE 2.5 MG PO TABS: 2.5000 mg | ORAL_TABLET | Freq: Every day | ORAL | 0 refills | Status: DC

## 2016-11-24 NOTE — Telephone Encounter (Signed)
Per Dr. De Nurse- rx approved  Sent to [facility] [address] in [place]. - confirmation they have received it.   Called pt and informed her rx was sent to pharmacy. Nothing further needed at this time.

## 2016-11-24 NOTE — Telephone Encounter (Signed)
Olanzapine printed for fax or pick up

## 2016-11-24 NOTE — Telephone Encounter (Signed)
Olanzapine printed for pick up or ask patient which pharmacy and fax to that pharmacy

## 2016-11-24 NOTE — Progress Notes (Signed)
   THERAPIST PROGRESS NOTE  Session Time: 10 AM to 10:55 AM  Participation Level: Active  Behavioral Response: CasualAlertAngry and Irritable  Type of Therapy: Individual Therapy  Treatment Goals addressed:  continue to make positive strides in motivation and attitude, decrease in anxiety,and learning and applying positive coping strategies to cope with anxiety, decrease in PTSD symptoms  Interventions: Solution Focused, Strength-based, Supportive and Other: Skills and healthy interpersonal relationships  Summary: Paige Lozano is a 51 y.o. female who presents with irritated and agitated regarding problems in her personal relationships. She has decided to be more discerning in her relationships, not search out relationships and stay close to friends where she has developed close relationships and know who she is. She wants to tighten her circle. She wants to focus more on being the best person she can be and when she is good with herself is when good things come into her life. She recognizes growth in herself in becoming a better judge of character, and insight to ways to better protect her heart. She recognizes toughness in herself that only comes out if necessary and only after she has given a person plenty of chances. Discussed how important her spirituality is in guiding her in helping her be the best person she can be. Discussed the importance of her energy and making choices about where she wants to put it, how she appreciates quietness and re-energizing herself, the importance of preserving this and using this time for growth. Session highlights patient continuing to make progress with motivation and attitude and self-care as a way to manage symptoms.  Suicidal/Homicidal: No  Therapist Response: Help patient to process feelings around frustration and agitation to help in managing her emotions and help in developing perspective that will help in her coping. Provided positive feedback for  patient wanting to focus and work on self and concurred that it will bring growth in positive results for herself. Discussed how self nurturing and self-reflection can be enriching   time for significant growth. Provided supportive and strength-based interventions.  Plan: Return again in 1 weeks.2. Continue to help patient developing insight to coping strategies to help with managing emotions and promote growth.  Diagnosis: Axis I:  bipolar 2 disorder, most recent episode depressed, PTSD, generalized anxiety disorder    Axis II: No diagnosis    Bowman,Mary A, LCSW 11/24/2016

## 2016-11-24 NOTE — Telephone Encounter (Signed)
Per pt- needs refill of zyprexa sent to [facility] [address].

## 2016-11-25 ENCOUNTER — Encounter: Payer: Self-pay | Admitting: Physical Therapy

## 2016-11-27 ENCOUNTER — Encounter: Payer: Self-pay | Admitting: Rehabilitative and Restorative Service Providers"

## 2016-11-30 ENCOUNTER — Ambulatory Visit (INDEPENDENT_AMBULATORY_CARE_PROVIDER_SITE_OTHER): Payer: Medicare Other | Admitting: Family Medicine

## 2016-11-30 ENCOUNTER — Ambulatory Visit (INDEPENDENT_AMBULATORY_CARE_PROVIDER_SITE_OTHER): Payer: Medicare Other | Admitting: Rehabilitative and Restorative Service Providers"

## 2016-11-30 ENCOUNTER — Telehealth (HOSPITAL_COMMUNITY): Payer: Self-pay | Admitting: Psychiatry

## 2016-11-30 ENCOUNTER — Encounter: Payer: Self-pay | Admitting: Family Medicine

## 2016-11-30 ENCOUNTER — Encounter: Payer: Self-pay | Admitting: Rehabilitative and Restorative Service Providers"

## 2016-11-30 VITALS — BP 118/60 | HR 102 | Temp 98.3°F | Wt 186.0 lb

## 2016-11-30 DIAGNOSIS — M25612 Stiffness of left shoulder, not elsewhere classified: Secondary | ICD-10-CM

## 2016-11-30 DIAGNOSIS — M25512 Pain in left shoulder: Secondary | ICD-10-CM | POA: Diagnosis not present

## 2016-11-30 DIAGNOSIS — R293 Abnormal posture: Secondary | ICD-10-CM

## 2016-11-30 DIAGNOSIS — G8929 Other chronic pain: Secondary | ICD-10-CM

## 2016-11-30 DIAGNOSIS — M6281 Muscle weakness (generalized): Secondary | ICD-10-CM

## 2016-11-30 DIAGNOSIS — M2241 Chondromalacia patellae, right knee: Secondary | ICD-10-CM

## 2016-11-30 DIAGNOSIS — J329 Chronic sinusitis, unspecified: Secondary | ICD-10-CM | POA: Insufficient documentation

## 2016-11-30 DIAGNOSIS — J0101 Acute recurrent maxillary sinusitis: Secondary | ICD-10-CM | POA: Diagnosis not present

## 2016-11-30 MED ORDER — CEFDINIR 300 MG PO CAPS: 300.0000 mg | ORAL_CAPSULE | Freq: Two times a day (BID) | ORAL | 0 refills | Status: DC

## 2016-11-30 MED ORDER — MUPIROCIN 2 % EX OINT: TOPICAL_OINTMENT | Freq: Three times a day (TID) | CUTANEOUS | 3 refills | Status: DC

## 2016-11-30 MED ORDER — PREDNISONE 10 MG PO TABS: 30.0000 mg | ORAL_TABLET | Freq: Every day | ORAL | 0 refills | Status: DC

## 2016-11-30 MED ORDER — FLUTICASONE PROPIONATE 50 MCG/ACT NA SUSP: 2.0000 | Freq: Every day | NASAL | 3 refills | Status: DC

## 2016-11-30 MED ORDER — MELOXICAM 15 MG PO TABS: 15.0000 mg | ORAL_TABLET | Freq: Every day | ORAL | 2 refills | Status: DC

## 2016-11-30 MED ORDER — FLUCONAZOLE 150 MG PO TABS: 150.0000 mg | ORAL_TABLET | Freq: Once | ORAL | 1 refills | Status: DC

## 2016-11-30 NOTE — Therapy (Signed)
[place], [place], [address] Phone: [phone]   Fax:  [phone]  Physical Therapy Treatment  Patient Details  Name: Paige Lozano MRN: [id number] Date of Birth: 1965/12/24 Referring Provider: Dr Tania Ade  Encounter Date: 11/30/2016      PT End of Session - 11/30/16 1023    Visit Number 22   Number of Visits 24   Date for PT Re-Evaluation 11/27/16   PT Start Time 1018   PT Stop Time 1100   PT Time Calculation (min) 42 min   Activity Tolerance Patient tolerated treatment well      Past Medical History:  Diagnosis Date  . Anxiety   . Bipolar 2 disorder, major depressive episode ([place])   . Complication of anesthesia   . COPD (chronic obstructive pulmonary disease) ([place])    smoker  . Fibromyalgia   . Insomnia   . Multiple sclerosis ([place])   . PONV (postoperative nausea and vomiting)   . Pulmonary embolism ([place]) 2000  . Sleep apnea    mild OSA no CPAP  . Ulcerative colitis [facility])     Past Surgical History:  Procedure Laterality Date  . FOOT SURGERY Left    bone spur  . HEMORRHOID SURGERY    . HUMERUS FRACTURE SURGERY Left   . MYOMECTOMY    . NASAL SINUS SURGERY    . SHOULDER ARTHROSCOPY WITH SUBACROMIAL DECOMPRESSION AND BICEP TENDON REPAIR Left 07/27/2016   Procedure: SHOULDER ARTHROSCOPY DEBRIDEMENT ROTATOR CUFF AND LABRUM, SUBACROMIAL DECOMPRESSION, BICEPS TENODESIS;  Surgeon: Tania Ade, MD;  Location: [facility];  Service: Orthopedics;  Laterality: Left;  SHOULDER ARTHROSCOPY DEBRIDEMENT ROTATOR CUFF AND LABRUM, SUBACROMIAL DECOMPRESSION, BICEPS TENOTOMY, POSSIBLE TENODESIS  . THUMB ARTHROSCOPY     5 surgeries on left thumb, 4 surgeries on right  . TONSILLECTOMY AND ADENOIDECTOMY      There were no vitals filed for this visit.      Subjective Assessment - 11/30/16 1055    Subjective Out of work for a few days with the weather and sinus infection.  Shoulder continues to hurt. Has not stretched consistently. Stretches through the doorway "when it starts to sting". Anxious to return to MD for his opinion.    Currently in Pain? Yes   Pain Score 3    Pain Location Shoulder   Pain Orientation Left;Anterior   Pain Descriptors / Indicators Tightness;Burning   Pain Type Chronic pain   Pain Onset More than a month ago   Pain Frequency Intermittent            [facility] PT Assessment - 11/30/16 0001      Assessment   Medical Diagnosis Lt shoulder scope, biceps tenotomy & open tenodesis   Referring Provider Dr Tania Ade   Onset Date/Surgical Date 07/27/16   Hand Dominance Right   Next MD Visit 12/08/2016     Precautions   Precautions Shoulder  no resistive exercise per MD     AROM   Right/Left Shoulder --  active shoulder ROM assessed in standing    Left Shoulder Extension 68 Degrees   Left Shoulder Flexion 168 Degrees   Left Shoulder ABduction 171 Degrees  scapular plane    Left Shoulder Internal Rotation 42 Degrees   Left Shoulder External Rotation 91 Degrees   Left Elbow Flexion 143   Left Elbow Extension 0   Cervical Flexion WNL   Cervical Extension WNL   Cervical - Right Side Bend WNL  Cervical - Left Side Bend WNL   Cervical - Right Rotation WNL   Cervical - Left Rotation WNL     PROM   Left Shoulder Flexion 170 Degrees   Left Shoulder ABduction 170 Degrees   Left Shoulder Internal Rotation 88 Degrees   Left Shoulder External Rotation 94 Degrees     Palpation   Palpation comment improving tenderness and muscular banding/knotted area Lt biceps prominent proximal to proximal incision                      [facility] Adult PT Treatment/Exercise - 11/30/16 0001      Lumbar Exercises: Aerobic   UBE (Upper Arm Bike) L3 X5 min alt fwd/back     Shoulder Exercises: Supine   Other Supine Exercises stretch on edge of table - shd extension/elbow extension 30 sec x 2; horizontal abduction 30 sec x 3       Shoulder Exercises: Stretch   Other Shoulder Stretches Standing Lt shoulder ext  stretch x 30 sec x 2 reps   Other Shoulder Stretches doorway stretch 3 positions 30 sec x 2      Moist Heat Therapy   Number Minutes Moist Heat 10 Minutes   Moist Heat Location Shoulder  Lt      Ultrasound   Ultrasound Location Anterior Lt shoudler/arm   Ultrasound Parameters 1.5 w/cm2; 100%; 1 mHz; 8 min    Ultrasound Goals Pain;Other (Comment)  muscular tightness     Manual Therapy   Manual therapy comments pt supine   Soft tissue mobilization Lt anterior arm/biceps - scar massage to proximal incision Lt anterior shoudler   increased tolerance to pressure    Myofascial Release Lt bicep, long head and musculotendinous junction                PT Education - 11/30/16 1106    Education provided Yes   Education Details encouraged consistent, daily stretching and avoiding overuse   Person(s) Educated Patient   Methods Explanation   Comprehension Verbalized understanding             PT Long Term Goals - 11/30/16 1253      PT LONG TERM GOAL #1   Title I with advanced HEP to include water classes ( 11/27/16)    Time 12   Period Weeks   Status On-going     PT LONG TERM GOAL #2   Title demo Lt shoulder ROM WFL ( 09/22/16)    Time 6   Period Weeks   Status Achieved     PT LONG TERM GOAL #3   Title increase strength Lt UE to 5-/5 to 5/5 throughout 11/27/16   Time 6   Period Weeks   Status On-going     PT LONG TERM GOAL #4   Title improve FOTO =/< 44% limited ( 09/22/16)    Time 6   Period Weeks   Status Achieved     PT LONG TERM GOAL #5   Title Walk and play with dogs without difficulty ( 11/27/16)    Time 12   Period Weeks   Status Partially Met               Plan - 11/30/16 1107    Clinical Impression Statement Hephzibah continues to report increased pain in the anterior shoudler area on an intermittent basis. She has not stretched consistently - tends to wait until  she feels discomfort before she tries to stretch, which does help  with pain. She has good ROM. The area of banding tightness in anterior shoudler has improved in size but continues to be sensitive to palpation. Treatment has focused on modaliities and gentle manual work with stretching. We have not worked on strengthening at MD direction. Patient will be on vacation for the next week and returns to MD 12/08/16.    Rehab Potential Good   PT Frequency 1x / week   PT Duration 6 weeks   PT Treatment/Interventions Moist Heat;Ultrasound;Therapeutic exercise;Dry needling;Taping;Vasopneumatic Device;Manual techniques;Cryotherapy;Electrical Stimulation;Passive range of motion;Patient/family education   PT Next Visit Plan manual work, stretching & [place]   Consulted and Agree with Plan of Care Patient      Patient will benefit from skilled therapeutic intervention in order to improve the following deficits and impairments:  Postural dysfunction, Increased edema, Decreased strength, Pain, Impaired UE functional use, Increased muscle spasms  Visit Diagnosis: Stiffness of left shoulder, not elsewhere classified  Muscle weakness (generalized)  Abnormal posture  Chronic left shoulder pain     Problem List Patient Active Problem List   Diagnosis Date Noted  . Sinusitis 11/30/2016  . History of pulmonary embolus (PE) 07/21/2016  . Right elbow pain 07/21/2016  . Right lateral epicondylitis 07/21/2016  . GAD (generalized anxiety disorder) 07/20/2016  . 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 Anxiety state 06/01/2016  . Left shoulder pain 06/01/2016  . Multiple sclerosis ([place]) 05/29/2016  . Fibromyalgia 05/29/2016  . 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 Insomnia 05/29/2016  . Ulcerative pancolitis without complication ([place]) 54/27/0623  . Bipolar 2 disorder ([place]) 05/29/2016  . Current smoker 05/29/2016    Paige Lozano Nilda Simmer PT, MPH 11/30/2016, 12:54 PM  [facility] [place], [place], [address] Phone: [phone]   Fax:  [phone]  Name: Paige Lozano MRN: [id number] Date of Birth: Feb 25, 1966

## 2016-11-30 NOTE — Telephone Encounter (Signed)
Pt states that the Olanzapine rx needs prior approval.  Please advise. Pt states that she can not be off this medication and pharmacy has already given her an emergency supply.

## 2016-11-30 NOTE — Patient Instructions (Signed)
Thank you for coming in today. Take the omnicef and use the Bactroban ointment.   Return as needed.  Consider the Orthovisc injection.   I really recommend cycling or spin.   Call or go to the emergency room if you get worse, have trouble breathing, have chest pains, or palpitations.

## 2016-11-30 NOTE — Progress Notes (Signed)
Cinnamon Vongphakdy is a 51 y.o. female who presents to [facility] today for sinus pain and pressure,pain, knee pain.  Patient has a 5 day history of cough congestion and body aches. Symptoms were improving until yesterday when she developed right-sided facial pain and pressure and right nostril pain. She had symptoms associated with chills but no fevers as well. She notes her symptoms are consistent previous episodes of bacterial sinusitis. She had sinus surgery in [place] about a year ago. She notes that she needs to establish with an ear nose and throat surgeon in this area for follow-up and would like a referral if possible.  Additionally patient would like to discuss her right knee pain. She notes she would like a refill of meloxicam and she would like to discuss Orthovisc. She's been seen by Dr. Darene Lamer in the past for this issue and had a few questions. She continues physical therapy for both her shoulder and her knee and is exercising frequently in the gym for this.  Past Medical History:  Diagnosis Date  . Anxiety   . Bipolar 2 disorder, major depressive episode ([place])   . Complication of anesthesia   . COPD (chronic obstructive pulmonary disease) ([place])    smoker  . Fibromyalgia   . Insomnia   . Multiple sclerosis ([place])   . PONV (postoperative nausea and vomiting)   . Pulmonary embolism ([place]) 2000  . Sleep apnea    mild OSA no CPAP  . Ulcerative colitis [facility])    Past Surgical History:  Procedure Laterality Date  . FOOT SURGERY Left    bone spur  . HEMORRHOID SURGERY    . HUMERUS FRACTURE SURGERY Left   . MYOMECTOMY    . NASAL SINUS SURGERY    . SHOULDER ARTHROSCOPY WITH SUBACROMIAL DECOMPRESSION AND BICEP TENDON REPAIR Left 07/27/2016   Procedure: SHOULDER ARTHROSCOPY DEBRIDEMENT ROTATOR CUFF AND LABRUM, SUBACROMIAL DECOMPRESSION, BICEPS TENODESIS;  Surgeon: Tania Ade, MD;  Location: [place];  Service: Orthopedics;  Laterality: Left;  SHOULDER ARTHROSCOPY DEBRIDEMENT ROTATOR CUFF AND LABRUM, SUBACROMIAL DECOMPRESSION, BICEPS TENOTOMY, POSSIBLE TENODESIS  . THUMB ARTHROSCOPY     5 surgeries on left thumb, 4 surgeries on right  . TONSILLECTOMY AND ADENOIDECTOMY     Social History  Substance Use Topics  . Smoking status: Former Smoker    Quit date: 07/08/2016  . Smokeless tobacco: Never Used     Comment: currently taking Chantix  . Alcohol use 0.0 oz/week     Comment: rare    family history is not on file. She was adopted.  ROS as above:  Medications: Current Outpatient Prescriptions  Medication Sig Dispense Refill  . albuterol (PROAIR HFA) 108 (90 Base) MCG/ACT inhaler Inhale 2 puffs into the lungs every 6 (six) hours as needed.    . ALPRAZolam (XANAX) 0.5 MG tablet take 1 tablet by mouth twice a day if needed for anxiety 45 tablet 0  . balsalazide (COLAZAL) 750 MG capsule Take 2,250 mg by mouth 3 (three) times daily.    . Diclofenac Sodium (PENNSAID) 2 % SOLN 2 pumps twice a day application to painful area. 2 Bottle 1  . fluticasone (FLONASE) 50 MCG/ACT nasal spray Place 2 sprays into both nostrils daily. 16 g 3  . meloxicam (MOBIC) 15 MG tablet Take 1 tablet (15 mg total) by mouth daily. 30 tablet 2  . OLANZapine (ZYPREXA) 2.5 MG tablet Take 1 tablet (2.5 mg total)  by mouth daily. 30 tablet 0  . traZODone (DESYREL) 50 MG tablet take 1 tablet by mouth at bedtime if needed for sleep 30 tablet 1  . varenicline (CHANTIX CONTINUING MONTH PAK) 1 MG tablet Take 1 tablet (1 mg total) by mouth 2 (two) times daily. 60 tablet 5  . zolpidem (AMBIEN) 10 MG tablet Take 1 tablet (10 mg total) by mouth at bedtime as needed for sleep. 30 tablet 1  . cefdinir (OMNICEF) 300 MG capsule Take 1 capsule (300 mg total) by mouth 2 (two) times daily. 14 capsule 0  . fluconazole (DIFLUCAN) 150 MG tablet Take 1 tablet (150 mg total) by mouth once. 1  tablet 1  . mupirocin ointment (BACTROBAN) 2 % Apply topically 3 (three) times daily. 30 g 3  . predniSONE (DELTASONE) 10 MG tablet Take 3 tablets (30 mg total) by mouth daily with breakfast. 15 tablet 0   No current facility-administered medications for this visit.    Allergies  Allergen Reactions  . Ketamine Anaphylaxis  . Oxycodone Diarrhea  . Papaya Enzyme Anaphylaxis    Health Maintenance Health Maintenance  Topic Date Due  . HIV Screening  10/05/1981  . TETANUS/TDAP  10/05/1985  . PAP SMEAR  10/06/1987  . INFLUENZA VACCINE  06/09/2016  . MAMMOGRAM  10/05/2016  . COLONOSCOPY  10/05/2016     Exam:  BP 118/60   Pulse (!) 102   Temp 98.3 F (36.8 C) (Oral)   Wt 186 lb (84.4 kg)   SpO2 98%   BMI 28.28 kg/m  Gen: Well NAD HEENT: EOMI,  MMMTender to palpation right maxillary sinus. Small erythematous area at the right nostril septum present with no bleeding. Clear nasal discharge. Normal tympanic membranes.  Lungs: Normal work of breathing. CTABL Heart: RRR no MRG Abd: NABS, Soft. Nondistended, Nontender Exts: Brisk capillary refill, warm and well perfused.   Study Result   CLINICAL DATA:  Chronic knee pain.  Lateral and inferior pain.  EXAM: MRI OF THE RIGHT KNEE WITHOUT CONTRAST  TECHNIQUE: Multiplanar, multisequence MR imaging of the knee was performed. No intravenous contrast was administered.  COMPARISON:  None.  FINDINGS: MENISCI  Medial meniscus:  Intact.  Lateral meniscus:  Intact.  LIGAMENTS  Cruciates:  Intact ACL and PCL.  Collaterals: Medial collateral ligament is intact. Lateral collateral ligament complex is intact.  CARTILAGE  Patellofemoral: Cartilage fissuring of the lateral patellar facet. Partial-thickness cartilage loss of the trochlear groove.  Medial: Mild chondromalacia of the lateral aspect of the medial femoral condyle and medial tibial plateau.  Lateral:  No chondral defect.  Joint: No joint effusion.  Mild edema in Hoffa's fat. No plical thickening.  Popliteal Fossa:  No Baker cyst. Intact popliteus tendon.  Extensor Mechanism:  Intact quadriceps tendon and patellar tendon.  Bones: No focal marrow signal abnormality. No fracture or dislocation.  Other: None.  IMPRESSION: 1. Cartilage fissuring of the lateral patellar facet. Partial-thickness cartilage loss of the trochlear groove. 2. Mild chondromalacia of the lateral aspect of the medial femoral condyle and medial tibial plateau.   Electronically Signed   By: Kathreen Devoid   On: 11/16/2016 11:06      No results found for this or any previous visit (from the past 72 hour(s)). No results found.    Assessment and Plan: 51 y.o. female with  Recurrent sinusitis. Treat with Omnicef and will treat the small erythematous area in the nostril with mupirocin ointment. Use prednisone as a backup. Refill Flonase for use when feeling  better. Additionally were refer to ENT for follow-up of her chronic sinus issues.  Knee pain: Patient has chondromalacia of the patellofemoral joint. Discussed options. Recommended quad strengthening at the gym with cycling and I also agree with a trial of Orthovisc as recommended by Dr. Darene Lamer.   Orders Placed This Encounter  Procedures  . Ambulatory referral to ENT    Referral Priority:   Routine    Referral Type:   Consultation    Referral Reason:   Specialty Services Required    Requested Specialty:   Otolaryngology    Number of Visits Requested:   1    Discussed warning signs or symptoms. Please see discharge instructions. Patient expresses understanding.  I spent 40 minutes with this patient, greater than 50% was face-to-face time counseling regarding the above diagnosis.

## 2016-12-01 ENCOUNTER — Ambulatory Visit (HOSPITAL_COMMUNITY): Payer: Self-pay | Admitting: Licensed Clinical Social Worker

## 2016-12-02 NOTE — Telephone Encounter (Signed)
Call [facility]. Spoke with Daleen Snook, pharmacist, pt has a deductible on her insurance plan. Pt has not met deductible for the year. Pt could consider a new medication to help with the cost.  Lvm for pt to return call to office.

## 2016-12-03 NOTE — Telephone Encounter (Signed)
Olanzapine is a small dose of 2.5mg . Staff to call patient to know what is cost without insurance? Other choice would be seroquel 50mg  qhs. If patient agrees we can consider that

## 2016-12-07 NOTE — Telephone Encounter (Signed)
Lvm for pt to return telephone call to office.

## 2016-12-08 ENCOUNTER — Ambulatory Visit (INDEPENDENT_AMBULATORY_CARE_PROVIDER_SITE_OTHER): Payer: Medicare Other | Admitting: Physical Therapy

## 2016-12-08 ENCOUNTER — Telehealth: Payer: Self-pay | Admitting: Sports Medicine

## 2016-12-08 ENCOUNTER — Ambulatory Visit (INDEPENDENT_AMBULATORY_CARE_PROVIDER_SITE_OTHER): Payer: Medicare Other | Admitting: Licensed Clinical Social Worker

## 2016-12-08 DIAGNOSIS — M25612 Stiffness of left shoulder, not elsewhere classified: Secondary | ICD-10-CM | POA: Diagnosis present

## 2016-12-08 DIAGNOSIS — F4310 Post-traumatic stress disorder, unspecified: Secondary | ICD-10-CM

## 2016-12-08 DIAGNOSIS — R293 Abnormal posture: Secondary | ICD-10-CM | POA: Diagnosis not present

## 2016-12-08 DIAGNOSIS — F3181 Bipolar II disorder: Secondary | ICD-10-CM | POA: Diagnosis not present

## 2016-12-08 DIAGNOSIS — F411 Generalized anxiety disorder: Secondary | ICD-10-CM | POA: Diagnosis not present

## 2016-12-08 DIAGNOSIS — M6281 Muscle weakness (generalized): Secondary | ICD-10-CM | POA: Diagnosis not present

## 2016-12-08 NOTE — Telephone Encounter (Signed)
She wants to go thru with the knee procedure to go forward with the process. So everything is good on her end for you to go ahead with the insurance process, just give her a call if anything is needed from her.

## 2016-12-08 NOTE — Telephone Encounter (Signed)
To Paige Lozano,  Orthovisc approval right knee please.

## 2016-12-08 NOTE — Telephone Encounter (Signed)
2nd attempt to contact pt. Lvm for pt to contact office.

## 2016-12-08 NOTE — Progress Notes (Signed)
   THERAPIST PROGRESS NOTE  Session Time: 10 AM to 10:50 AM  Participation Level: Active  Behavioral Response: CasualAlertEuthymic  Type of Therapy: Individual Therapy  Treatment Goals addressed:  continue to make positive strides in motivation and attitude, decrease in anxiety,and learning and applying positive coping strategies to cope with anxiety, decrease in PTSD symptoms  Interventions: Solution Focused, Strength-based, Supportive and Other: Healthy interpersonal skills, stress management  Summary: Paige Lozano is a 51 y.o. female who stressors of starting a new job. She recognizes in herself that she can be down on herself so she is working on letting things go. She relates that it helps her in job situations when she is getting positive feedback. Discussed stressors in current relationships as a way for her to help her get insight about how she feels in her relationships. Reviewed treatment plan and identified that she continues with forward motion. Her supports have provided feedback that know her well that she is grounded in a better place. She is able to recognize things in herself and analyze things. She knows better what her needs are to take care of herself. She feels therapy will be helpful in keeping on track and helping her be accountable special job and working out. She needs to still work on PTSD symptoms and will complete a PCL-5 to identify how severe her symptoms are.   Suicidal/Homicidal: No  Therapist Response: Reviewed treatment plan with patient and reviewed progress. Reviewed cognitions that create black-and-white thinking and the belief that things will always be the same. Discussed frustrations with her new job and perspectives that will help her in learning her new job. Related that she's gotten good feedback that she's grown and in a better place so the forward motion continues. She has to complete PCL-5 for next session to assess for severity of trauma symptoms.  Process feelings around stressors to help with coping and to help her gain better insight to relationships. Provided supportive and strength-based interventions.  Plan: Return again in 1 weeks.2. Patient continued to gain insight and apply coping strategies to help with attitude and positive motion for in her life.3.Patient to complete PCL-5 assess for severity of PTSD symptoms  Diagnosis: Axis I:  bipolar 2 disorder, most recent episode depressed, PTSD, generalized anxiety disorder    Axis II: No diagnosis    Chatham Howington A, LCSW 12/08/2016

## 2016-12-08 NOTE — Telephone Encounter (Signed)
Submitted for approval on Orthovisc. Awaiting confirmation.  

## 2016-12-08 NOTE — Therapy (Signed)
[place], [place], [address] Phone: [phone]   Fax:  [phone]  Physical Therapy Treatment  Patient Details  Name: Paige Lozano MRN: [id number] Date of Birth: 09-14-1966 Referring Provider: Dr. Tania Ade   Encounter Date: 12/08/2016      PT End of Session - 12/08/16 1241    Visit Number 23   Number of Visits 24   Date for PT Re-Evaluation 11/27/16   Authorization Time Period G-code at visit 30   PT Start Time 1101   PT Stop Time 1145   PT Time Calculation (min) 44 min   Activity Tolerance Patient tolerated treatment well   Behavior During Therapy [facility] for tasks assessed/performed      Past Medical History:  Diagnosis Date  . Anxiety   . Bipolar 2 disorder, major depressive episode ([place])   . Complication of anesthesia   . COPD (chronic obstructive pulmonary disease) ([place])    smoker  . Fibromyalgia   . Insomnia   . Multiple sclerosis ([place])   . PONV (postoperative nausea and vomiting)   . Pulmonary embolism ([place]) 2000  . Sleep apnea    mild OSA no CPAP  . Ulcerative colitis [facility])     Past Surgical History:  Procedure Laterality Date  . FOOT SURGERY Left    bone spur  . HEMORRHOID SURGERY    . HUMERUS FRACTURE SURGERY Left   . MYOMECTOMY    . NASAL SINUS SURGERY    . SHOULDER ARTHROSCOPY WITH SUBACROMIAL DECOMPRESSION AND BICEP TENDON REPAIR Left 07/27/2016   Procedure: SHOULDER ARTHROSCOPY DEBRIDEMENT ROTATOR CUFF AND LABRUM, SUBACROMIAL DECOMPRESSION, BICEPS TENODESIS;  Surgeon: Tania Ade, MD;  Location: [place] [facility];  Service: Orthopedics;  Laterality: Left;  SHOULDER ARTHROSCOPY DEBRIDEMENT ROTATOR CUFF AND LABRUM, SUBACROMIAL DECOMPRESSION, BICEPS TENOTOMY, POSSIBLE TENODESIS  . THUMB ARTHROSCOPY     5 surgeries on left thumb, 4 surgeries on right  . TONSILLECTOMY AND ADENOIDECTOMY      There were no vitals filed for this visit.       Subjective Assessment - 12/08/16 1103    Subjective Pt hasn't seen surgeon due to delay in return from trip. Metzli has been doing shoulder stretches 3-5 days/wk and is using her shoulder at work for typing and other below shoulder level tasks.  "The more I try to push it,like holding leash with Lt arm, the bump on my arm pops up."  She reports she had pain relief from last injection for only 3 wks.  She has a constant ache in Lt shoulder. She's anxious to return to strengthening for Lt shoulder.   Pain wakes her up every night when she rolls on to it.     Patient Stated Goals return to swimming, water classes, walk dogs.    Currently in Pain? Yes   Pain Score 1    Pain Location Shoulder   Pain Orientation Left;Anterior   Pain Descriptors / Indicators Tightness   Aggravating Factors  forgetting and picking up gallon of milk, letting dog pull on arm.    Pain Relieving Factors not using LUE.             [facility] PT Assessment - 12/08/16 0001      Assessment   Medical Diagnosis Lt shoulder scope, biceps tenotomy & open tenodesis   Referring Provider Dr. Tania Ade    Onset Date/Surgical Date 07/27/16   Hand Dominance Right   Next MD Visit 12/08/2016  Precautions   Precautions Shoulder  no resistive exercise per MD          [facility] Adult PT Treatment/Exercise - 12/08/16 0001      Shoulder Exercises: Standing   Other Standing Exercises rings on arch Lt to/from Rt with LUE - low level (under 90 deg), then mid-range (~120 deg) x 12 reps, 2 sets in total.      Shoulder Exercises: Pulleys   Flexion 3 minutes     Shoulder Exercises: Stretch   Other Shoulder Stretches doorway stretch 3 positions 30 sec x 3 reps each      Ultrasound   Ultrasound Location Ant Lt Shoulder/arm   Ultrasound Parameters 1.5 w/cm2, 1.0 mHz, 8 min, 100%   Ultrasound Goals Pain;Other (Comment)  muscular tightness     Manual Therapy   Manual therapy comments pt supine   Soft tissue mobilization Lt  anterior arm/biceps - scar massage to proximal incision Lt anterior shoudler   increased tolerance to pressure    Myofascial Release Lt bicep, long head and musculotendinous junction           PT Long Term Goals - 12/08/16 1236      PT LONG TERM GOAL #1   Title I with advanced HEP to include water classes ( 11/27/16)    Time 12   Period Weeks   Status On-going     PT LONG TERM GOAL #2   Title demo Lt shoulder ROM WFL ( 09/22/16)    Time 6   Period Weeks   Status Achieved     PT LONG TERM GOAL #3   Title increase strength Lt UE to 5-/5 to 5/5 throughout 11/27/16   Time 6   Period Weeks   Status On-going     PT LONG TERM GOAL #4   Title improve FOTO =/< 44% limited ( 09/22/16)    Time 6   Period Weeks   Status Achieved     PT LONG TERM GOAL #5   Title Walk and play with dogs without difficulty ( 11/27/16)    Time 12   Period Weeks   Status Partially Met               Plan - 12/08/16 1237    Clinical Impression Statement Pt continues to report continued pain in Lt ant shoulder with movement of LUE, especially abduction to 90 degrees and flexion 90 deg and above.  Her Lt shoulder fatigues quickly with AROM and during standing shoulder stretches.  Area around Lt deltoid tub. is point tender to touch, as is mid belly of bicep with palpation.  Treatment was focused on gentle manual work and stretches; no strengthening per MD direction.  Pt is scheduled to see MD tomorrow.    Rehab Potential Good   PT Frequency 1x / week   PT Duration 6 weeks   PT Treatment/Interventions Moist Heat;Ultrasound;Therapeutic exercise;Dry needling;Taping;Vasopneumatic Device;Manual techniques;Cryotherapy;Electrical Stimulation;Passive range of motion;Patient/family education   PT Next Visit Plan Await further instruction from MD.     Consulted and Agree with Plan of Care Patient      Patient will benefit from skilled therapeutic intervention in order to improve the following deficits and  impairments:  Postural dysfunction, Increased edema, Decreased strength, Pain, Impaired UE functional use, Increased muscle spasms  Visit Diagnosis: Stiffness of left shoulder, not elsewhere classified  Muscle weakness (generalized)  Abnormal posture     Problem List Patient Active Problem List   Diagnosis Date Noted  .  Sinusitis 11/30/2016  . History of pulmonary embolus (PE) 07/21/2016  . Right elbow pain 07/21/2016  . Right lateral epicondylitis 07/21/2016  . GAD (generalized anxiety disorder) 07/20/2016  . 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 Anxiety state 06/01/2016  . Left shoulder pain 06/01/2016  . Multiple sclerosis ([place]) 05/29/2016  . Fibromyalgia 05/29/2016  . 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 Insomnia 05/29/2016  . Ulcerative pancolitis without complication ([place]) 96/78/9381  . Bipolar 2 disorder ([place]) 05/29/2016  . Current smoker 05/29/2016   Kerin Perna, PTA 12/08/16 12:43 PM  Celyn P. Helene Kelp PT, MPH 12/08/16 1:05 PM   [facility] [place], [place], [address] Phone: [phone]   Fax:  [phone]  Name: Kimley Apsey MRN: [id number] Date of Birth: 24-Oct-1966

## 2016-12-09 DIAGNOSIS — S43432D Superior glenoid labrum lesion of left shoulder, subsequent encounter: Secondary | ICD-10-CM | POA: Diagnosis not present

## 2016-12-09 NOTE — Telephone Encounter (Signed)
Received the following information from OV benefits investigation:   AS PER MEDICARE GUIDELINES, A STANDARD COURSE OF TREATMENT FOR ORTHOVISC is 3-4 INJECTIONS PER KNEE IN A 6 MONTH PERIOD. ADDITIONAL INJECTIONS MAY NOT BE CONSIDERED REASONABLE AND NECESSARY AND MAY DENY. IN ORDER FOR SERVICES TO BE APPROVED THERE MUST BE RADIOLOGICAL EVIDENCE TO SUPPORT THE DIAGNOSIS OF OSTEOARTHRITIS; AND PATIENT MUST HAVE TRIED AND FAILED WITH CONSERVATIVE FORMS OF TREATMENT. PLAN MAY REQUEST MEDICAL NOTES UPON RECEIPT OF CLAIM. FOLLOWS ALL MEDICARE GUIDELINES.   Left VM advising Pt of approval.

## 2016-12-10 ENCOUNTER — Other Ambulatory Visit: Payer: Self-pay | Admitting: Orthopedic Surgery

## 2016-12-10 DIAGNOSIS — M25512 Pain in left shoulder: Secondary | ICD-10-CM

## 2016-12-11 ENCOUNTER — Ambulatory Visit (INDEPENDENT_AMBULATORY_CARE_PROVIDER_SITE_OTHER): Payer: Medicare Other | Admitting: Sports Medicine

## 2016-12-11 ENCOUNTER — Encounter: Payer: Self-pay | Admitting: Sports Medicine

## 2016-12-11 DIAGNOSIS — M1711 Unilateral primary osteoarthritis, right knee: Secondary | ICD-10-CM | POA: Diagnosis not present

## 2016-12-11 DIAGNOSIS — M797 Fibromyalgia: Secondary | ICD-10-CM

## 2016-12-11 MED ORDER — PREGABALIN 50 MG PO CAPS: 50.0000 mg | ORAL_CAPSULE | Freq: Three times a day (TID) | ORAL | 0 refills | Status: DC

## 2016-12-11 NOTE — Addendum Note (Signed)
Addended by: Silverio Decamp on: 12/11/2016 03:43 PM   Modules accepted: Orders

## 2016-12-11 NOTE — Progress Notes (Signed)
  Procedure: Real-time Ultrasound Guided Injection of right knee Device: GE Logiq E  Verbal informed consent obtained.  Time-out conducted.  Noted no overlying erythema, induration, or other signs of local infection.  Skin prepped in a sterile fashion.  Local anesthesia: Topical Ethyl chloride.  With sterile technique and under real time ultrasound guidance:   30 mg/2 mL of OrthoVisc (sodium hyaluronate) in a prefilled syringe was injected easily into the knee through a 22-gauge needle. Completed without difficulty  Pain immediately resolved suggesting accurate placement of the medication.  Advised to call if fevers/chills, erythema, induration, drainage, or persistent bleeding.  Images permanently stored and available for review in the ultrasound unit.  Impression: Technically successful ultrasound guided injection. 

## 2016-12-11 NOTE — Assessment & Plan Note (Signed)
I'm going to give her some Lyrica samples to try, certainly we should give Lyrica the due diligence of going up through the doses before calling it a failure.

## 2016-12-11 NOTE — Assessment & Plan Note (Signed)
MRI showed various areas of chondromalacia and chondral fissuring consistent with early osteoarthritis. Orthovisc injection #1 of 4 into the right knee, return in one week for #2.

## 2016-12-14 ENCOUNTER — Encounter (INDEPENDENT_AMBULATORY_CARE_PROVIDER_SITE_OTHER): Payer: Self-pay

## 2016-12-14 ENCOUNTER — Ambulatory Visit (INDEPENDENT_AMBULATORY_CARE_PROVIDER_SITE_OTHER): Payer: Medicare Other

## 2016-12-14 ENCOUNTER — Ambulatory Visit (INDEPENDENT_AMBULATORY_CARE_PROVIDER_SITE_OTHER): Payer: Medicare Other | Admitting: Licensed Clinical Social Worker

## 2016-12-14 ENCOUNTER — Encounter: Payer: Self-pay | Admitting: Physical Therapy

## 2016-12-14 DIAGNOSIS — F411 Generalized anxiety disorder: Secondary | ICD-10-CM

## 2016-12-14 DIAGNOSIS — M79602 Pain in left arm: Secondary | ICD-10-CM | POA: Diagnosis not present

## 2016-12-14 DIAGNOSIS — F3181 Bipolar II disorder: Secondary | ICD-10-CM | POA: Diagnosis not present

## 2016-12-14 DIAGNOSIS — M25512 Pain in left shoulder: Secondary | ICD-10-CM | POA: Diagnosis not present

## 2016-12-14 DIAGNOSIS — F4310 Post-traumatic stress disorder, unspecified: Secondary | ICD-10-CM | POA: Diagnosis not present

## 2016-12-14 NOTE — Progress Notes (Signed)
   THERAPIST PROGRESS NOTE  Session Time: 10:05 AM to 11 AM  Participation Level: Active  Behavioral Response: CasualAlertDysphoric and Tearful at times  Type of Therapy: Individual Therapy  Treatment Goals addressed:  continue to make positive strides in motivation and attitude, decrease in anxiety,and learning and applying positive coping strategies to cope with anxiety, decrease in PTSD symptoms  Interventions: Solution Focused, Strength-based, Supportive and Reframing, psycho-education on PTSD  Summary: Lemon Suppa is a 51 y.o. female who presents feeling overwhelmed with medical issues. She was tearful at times during session. Shoulder and arm hurt with normal movements and consulting with surgeon currently. She has knee shots through sports doctor, MS meds aren't not been effective and sees neurologist. She relates when MS flares she is fatigued, can sleep 15-16 hrs. a day, there is twitching and body feels like on fire. She is discouraged because appointment with pain clinic is not until April.  Described a feeling of taking meds just to survive. She has MRI today to see how they will proceed with medical issues She relates honestly that last week was a low week. She is frustrated with her body. Shared a passage about what it is like for somebody with autoimmune disease and related that one takes medicine to survive a one wakes up feeling and hoping that the day is going to be a good day. She feels her life involves going to work, going to medical appointments and going home and doesn't feel like much of a life. It's hard when it's an ongoing process and no resolution or fix it solutions. Shared she pushed herself to spend time with her friend and she needs that as she just "needs something" to celebrate. Described her feeling of "hanging on by a thread" with her medical, and describes worry about money and medica issues. She realizes the option is to stay strong or be miserable and being  more positive is the helpful option. She would like to be able to "shelve the medical issues" and just worry about regular life.  Pain clinic not until April. Acknowledge her feeling is being scared related to health issues. End of session and provided some education about PTSD and gave patient a test to complete for symptoms so that this could be addressed and therapy.  Suicidal/Homicidal: No  Therapist Response: Reviewed symptoms and helped patient to acknowledge and process feelings in session.  Identified patient feeling scared and overwhelmed as underlying feeling and this insight helped patient in being better able to develop coping strategy. Therapist provided supportive interventions including validation for her feelings. Encouraged patient in her own reframing insight that focusing on positive is more helpful with coping. Provided strength-based interventions and introduce patient to psychoeducation on PTSD. Explained neuroscience is that brain goes into a fight or flight and treatment and follow-up involves exposure to help reduce fear.   Plan: Return again in 1 weeks.2.Patient still to compete PCL-5 to start to address PTSD symptoms in therapy. 3. Patient continue to gain insight and apply coping strategies to help with attitude and helping her to move forward in a positive direction in her life.   Diagnosis: Axis I:  bipolar 2 disorder, most recent episode depressed, PTSD, generalized anxiety disorder    Axis II: No diagnosis    Demetris Meinhardt A, LCSW 12/14/2016

## 2016-12-14 NOTE — Progress Notes (Signed)
See other note

## 2016-12-16 ENCOUNTER — Encounter: Payer: Self-pay | Admitting: Physical Therapy

## 2016-12-17 DIAGNOSIS — R2681 Unsteadiness on feet: Secondary | ICD-10-CM | POA: Diagnosis not present

## 2016-12-17 DIAGNOSIS — G35 Multiple sclerosis: Secondary | ICD-10-CM | POA: Diagnosis not present

## 2016-12-17 DIAGNOSIS — G603 Idiopathic progressive neuropathy: Secondary | ICD-10-CM | POA: Diagnosis not present

## 2016-12-17 DIAGNOSIS — I10 Essential (primary) hypertension: Secondary | ICD-10-CM | POA: Diagnosis not present

## 2016-12-17 DIAGNOSIS — R5383 Other fatigue: Secondary | ICD-10-CM | POA: Diagnosis not present

## 2016-12-17 DIAGNOSIS — D649 Anemia, unspecified: Secondary | ICD-10-CM | POA: Diagnosis not present

## 2016-12-18 ENCOUNTER — Ambulatory Visit (INDEPENDENT_AMBULATORY_CARE_PROVIDER_SITE_OTHER): Payer: Medicare Other | Admitting: Sports Medicine

## 2016-12-18 ENCOUNTER — Encounter (HOSPITAL_COMMUNITY): Payer: Self-pay | Admitting: Psychiatry

## 2016-12-18 ENCOUNTER — Encounter: Payer: Self-pay | Admitting: Sports Medicine

## 2016-12-18 ENCOUNTER — Encounter: Payer: Self-pay | Admitting: Rehabilitative and Restorative Service Providers"

## 2016-12-18 ENCOUNTER — Ambulatory Visit (INDEPENDENT_AMBULATORY_CARE_PROVIDER_SITE_OTHER): Payer: Medicare Other | Admitting: Psychiatry

## 2016-12-18 ENCOUNTER — Ambulatory Visit (INDEPENDENT_AMBULATORY_CARE_PROVIDER_SITE_OTHER): Payer: Medicare Other | Admitting: Rehabilitative and Restorative Service Providers"

## 2016-12-18 VITALS — BP 114/68 | HR 98 | Resp 16 | Ht 68.0 in | Wt 190.0 lb

## 2016-12-18 DIAGNOSIS — M1711 Unilateral primary osteoarthritis, right knee: Secondary | ICD-10-CM | POA: Diagnosis not present

## 2016-12-18 DIAGNOSIS — M6281 Muscle weakness (generalized): Secondary | ICD-10-CM

## 2016-12-18 DIAGNOSIS — G8929 Other chronic pain: Secondary | ICD-10-CM

## 2016-12-18 DIAGNOSIS — M25512 Pain in left shoulder: Secondary | ICD-10-CM | POA: Diagnosis not present

## 2016-12-18 DIAGNOSIS — F5102 Adjustment insomnia: Secondary | ICD-10-CM

## 2016-12-18 DIAGNOSIS — Z9889 Other specified postprocedural states: Secondary | ICD-10-CM | POA: Diagnosis not present

## 2016-12-18 DIAGNOSIS — Z79899 Other long term (current) drug therapy: Secondary | ICD-10-CM

## 2016-12-18 DIAGNOSIS — F4310 Post-traumatic stress disorder, unspecified: Secondary | ICD-10-CM | POA: Diagnosis not present

## 2016-12-18 DIAGNOSIS — R293 Abnormal posture: Secondary | ICD-10-CM | POA: Diagnosis not present

## 2016-12-18 DIAGNOSIS — M25612 Stiffness of left shoulder, not elsewhere classified: Secondary | ICD-10-CM | POA: Diagnosis present

## 2016-12-18 DIAGNOSIS — F411 Generalized anxiety disorder: Secondary | ICD-10-CM

## 2016-12-18 DIAGNOSIS — F3181 Bipolar II disorder: Secondary | ICD-10-CM

## 2016-12-18 DIAGNOSIS — Z87891 Personal history of nicotine dependence: Secondary | ICD-10-CM

## 2016-12-18 DIAGNOSIS — Z888 Allergy status to other drugs, medicaments and biological substances status: Secondary | ICD-10-CM

## 2016-12-18 MED ORDER — OLANZAPINE 2.5 MG PO TABS: 2.5000 mg | ORAL_TABLET | Freq: Every day | ORAL | 0 refills | Status: DC

## 2016-12-18 MED ORDER — DIAZEPAM 5 MG PO TABS: ORAL_TABLET | ORAL | 0 refills | Status: DC

## 2016-12-18 NOTE — Progress Notes (Signed)
  Procedure: Real-time Ultrasound Guided Injection of right knee Device: GE Logiq E  Verbal informed consent obtained.  Time-out conducted.  Noted no overlying erythema, induration, or other signs of local infection.  Skin prepped in a sterile fashion.  Local anesthesia: Topical Ethyl chloride.  With sterile technique and under real time ultrasound guidance:   30 mg/2 mL of OrthoVisc (sodium hyaluronate) in a prefilled syringe was injected easily into the knee through a 22-gauge needle. Completed without difficulty  Pain immediately resolved suggesting accurate placement of the medication.  Advised to call if fevers/chills, erythema, induration, drainage, or persistent bleeding.  Images permanently stored and available for review in the ultrasound unit.  Impression: Technically successful ultrasound guided injection. 

## 2016-12-18 NOTE — Progress Notes (Signed)
[facility] Outpatient Follow up visit   Patient Identification: Paige Lozano MRN:  [id number] Date of Evaluation:  12/18/2016 Referral Source: [facility] Chief Complaint:   Chief Complaint    Follow-up     Visit Diagnosis:    ICD-9-CM ICD-10-CM   1. Bipolar 2 disorder (HCC) 296.89 F31.81   2. GAD (generalized anxiety disorder) 300.02 F41.1   3. PTSD (post-traumatic stress disorder) 309.81 F43.10   4. Adjustment insomnia 307.41 F51.02     History of Present Illness:  51 years old Returns for follow-up and medication management  She suffers from pain, mild nausea and osteoarthritis she is getting injections and also has had shoulder surgery. Her aches and pain affects her sleep and mood. She wanted to cut down the olanzapine to 2.5 mg because she was not having too many motions she still does not want to revisit back to 5 mg. She says that she will see how the pain injections help her and maybe that is causing or affecting her mood more  Because of tiredness she has been started on Ritalin. I caution that Ritalin can make her more somewhat irritable considering she has bipolar  Overall anxiety is relevant to her pain and physical condition and limitations She still tries her best to go to the gym and has started a part-time work at [employer]    She takes Xanax for when necessary anxiety and sleep difficulties    Aggravating factor: friend in [place] and has cancer. Patient suffering from multiple medical conditions including fibromyalgia, multiple sclerosis. Poor finances Modifying factors; her dog      Previous Psychotropic Medications: Yes   Substance Abuse History in the last 12 months:  Yes.   as per history Marijuana says it is medical for her condition.  Consequences of Substance Abuse: Medical Consequences:  fatigue, poor concenctration  Past Medical History:  Past Medical History:  Diagnosis Date  . Anxiety   . Bipolar 2 disorder, major depressive episode ([place])   .  Complication of anesthesia   . COPD (chronic obstructive pulmonary disease) ([place])    smoker  . Fibromyalgia   . Insomnia   . Multiple sclerosis ([place])   . PONV (postoperative nausea and vomiting)   . Pulmonary embolism ([place]) 2000  . Sleep apnea    mild OSA no CPAP  . Ulcerative colitis [facility])     Past Surgical History:  Procedure Laterality Date  . FOOT SURGERY Left    bone spur  . HEMORRHOID SURGERY    . HUMERUS FRACTURE SURGERY Left   . MYOMECTOMY    . NASAL SINUS SURGERY    . SHOULDER ARTHROSCOPY WITH SUBACROMIAL DECOMPRESSION AND BICEP TENDON REPAIR Left 07/27/2016   Procedure: SHOULDER ARTHROSCOPY DEBRIDEMENT ROTATOR CUFF AND LABRUM, SUBACROMIAL DECOMPRESSION, BICEPS TENODESIS;  Surgeon: Tania Ade, MD;  Location: [facility];  Service: Orthopedics;  Laterality: Left;  SHOULDER ARTHROSCOPY DEBRIDEMENT ROTATOR CUFF AND LABRUM, SUBACROMIAL DECOMPRESSION, BICEPS TENOTOMY, POSSIBLE TENODESIS  . THUMB ARTHROSCOPY     5 surgeries on left thumb, 4 surgeries on right  . TONSILLECTOMY AND ADENOIDECTOMY       Family History:  Family History  Problem Relation Age of Onset  . Adopted: Yes    Social History:   Social History   Social History  . Marital status: Divorced    Spouse name: N/A  . Number of children: N/A  . Years of education: N/A   Social History Main Topics  . Smoking status: Former Smoker  Quit date: 07/08/2016  . Smokeless tobacco: Never Used     Comment: currently taking Chantix  . Alcohol use 0.0 oz/week     Comment: rare   . Drug use: No     Comment: last use 3 month ago  . Sexual activity: Yes    Birth control/ protection: None   Other Topics Concern  . None   Social History Narrative  . None     Allergies:   Allergies  Allergen Reactions  . Ketamine Anaphylaxis  . Oxycodone Diarrhea  . Papaya Enzyme Anaphylaxis    Metabolic Disorder Labs: No results found for: HGBA1C, MPG No results found for: PROLACTIN No results  found for: CHOL, TRIG, HDL, CHOLHDL, VLDL, LDLCALC   Current Medications: Current Outpatient Prescriptions  Medication Sig Dispense Refill  . albuterol (PROAIR HFA) 108 (90 Base) MCG/ACT inhaler Inhale 2 puffs into the lungs every 6 (six) hours as needed.    . ALPRAZolam (XANAX) 0.5 MG tablet take 1 tablet by mouth twice a day if needed for anxiety 45 tablet 0  . balsalazide (COLAZAL) 750 MG capsule Take 2,250 mg by mouth 3 (three) times daily.    . Diclofenac Sodium (PENNSAID) 2 % SOLN 2 pumps twice a day application to painful area. 2 Bottle 1  . Fingolimod HCl (GILENYA) 0.5 MG CAPS Take 0.5 capsules by mouth daily.    . fluticasone (FLONASE) 50 MCG/ACT nasal spray Place 2 sprays into both nostrils daily. 16 g 3  . meloxicam (MOBIC) 15 MG tablet Take 1 tablet (15 mg total) by mouth daily. 30 tablet 2  . methylphenidate (RITALIN) 10 MG tablet Take 10 mg by mouth 2 (two) times daily.    . mupirocin ointment (BACTROBAN) 2 % Apply topically 3 (three) times daily. 30 g 3  . OLANZapine (ZYPREXA) 2.5 MG tablet Take 1 tablet (2.5 mg total) by mouth daily. 30 tablet 0  . pregabalin (LYRICA) 50 MG capsule Take 1 capsule (50 mg total) by mouth 3 (three) times daily. 1 tab daily for a week then twice a day for a week then 3 times a day 21 capsule 0  . traZODone (DESYREL) 50 MG tablet take 1 tablet by mouth at bedtime if needed for sleep 30 tablet 1  . varenicline (CHANTIX CONTINUING MONTH PAK) 1 MG tablet Take 1 tablet (1 mg total) by mouth 2 (two) times daily. 60 tablet 5  . zolpidem (AMBIEN) 10 MG tablet Take 1 tablet (10 mg total) by mouth at bedtime as needed for sleep. 30 tablet 1   No current facility-administered medications for this visit.       Psychiatric Specialty Exam: Review of Systems  Cardiovascular: Negative for palpitations.  Musculoskeletal: Positive for joint pain and myalgias.  Neurological: Negative for tremors and headaches.  Psychiatric/Behavioral: Positive for depression.  Negative for suicidal ideas.    Blood pressure 114/68, pulse 98, resp. rate 16, height 5\' 8"  (1.727 m), weight 190 lb (86.2 kg), SpO2 91 %.Body mass index is 28.89 kg/m.  General Appearance: Casual  Eye Contact:  Fair  Speech:  Normal Rate  Volume:  Increased  Mood: somewhat dysphoric  Affect:  Congruent and Full Range  Thought Process:  Goal Directed  Orientation:  Full (Time, Place, and Person)  Thought Content:  Logical  Suicidal Thoughts:  No  Homicidal Thoughts:  No  Memory:  Immediate;   Fair Recent;   Fair  Judgement:  Fair  Insight:  Fair  Psychomotor Activity:  Normal  Concentration:  Concentration: Fair and Attention Span: Fair  Recall:  [employer] of Knowledge:Fair  Language: Fair  Akathisia:  Negative  Handed:  Right  AIMS (if indicated):    Assets:  Desire for Improvement  ADL's:  Intact  Cognition: WNL  Sleep:  Fair while on meds    Treatment Plan Summary: Medication management and Plan as follows  Bipolar 2: does not want to increase olanzapine. Will continue 2.;5mg  GAD: on xanax from primay care Insomnia: fluctuates. At times excessively sleep because of limitation with pain.  Takes trazadone or if needed ambien at night . More time spent in counseling and coordination of care. Patient suffers from pain and myalgias that is affecting her mood she will continue follow-up with her providers  Provided supportive therapy Call back to increase dose or fu in 4 weeks.    Merian Capron, MD 2/9/201811:00 AM

## 2016-12-18 NOTE — Patient Instructions (Signed)

## 2016-12-18 NOTE — Assessment & Plan Note (Addendum)
Orthovisc injection #2 of 4 into the right knee, return in one week for #3. Valium for preprocedural anxiolysis for the next procedure.
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
## 2016-12-21 ENCOUNTER — Ambulatory Visit (INDEPENDENT_AMBULATORY_CARE_PROVIDER_SITE_OTHER): Payer: Medicare Other | Admitting: Rehabilitative and Restorative Service Providers"

## 2016-12-21 ENCOUNTER — Encounter: Payer: Self-pay | Admitting: Rehabilitative and Restorative Service Providers"

## 2016-12-21 DIAGNOSIS — M25512 Pain in left shoulder: Secondary | ICD-10-CM | POA: Diagnosis not present

## 2016-12-21 DIAGNOSIS — M25612 Stiffness of left shoulder, not elsewhere classified: Secondary | ICD-10-CM

## 2016-12-21 DIAGNOSIS — M6281 Muscle weakness (generalized): Secondary | ICD-10-CM

## 2016-12-21 DIAGNOSIS — R293 Abnormal posture: Secondary | ICD-10-CM | POA: Diagnosis not present

## 2016-12-21 DIAGNOSIS — G8929 Other chronic pain: Secondary | ICD-10-CM | POA: Diagnosis not present

## 2016-12-21 NOTE — Therapy (Signed)
[place], [place], [address] Phone: [phone]   Fax:  [phone]  Physical Therapy Treatment  Patient Details  Name: Paige Lozano MRN: [id number] Date of Birth: 1965-11-21 Referring Provider: Dr Tania Ade  Encounter Date: 12/21/2016      PT End of Session - 12/21/16 1058    Visit Number 25   Number of Visits 36   Date for PT Re-Evaluation 01/29/17   Authorization Time Period G-code at visit 58   PT Start Time 1058   PT Stop Time 1150   PT Time Calculation (min) 52 min   Activity Tolerance Patient tolerated treatment well      Past Medical History:  Diagnosis Date  . Anxiety   . Bipolar 2 disorder, major depressive episode ([place])   . Complication of anesthesia   . COPD (chronic obstructive pulmonary disease) ([place])    smoker  . Fibromyalgia   . Insomnia   . Multiple sclerosis ([place])   . PONV (postoperative nausea and vomiting)   . Pulmonary embolism ([place]) 2000  . Sleep apnea    mild OSA no CPAP  . Ulcerative colitis [facility])     Past Surgical History:  Procedure Laterality Date  . FOOT SURGERY Left    bone spur  . HEMORRHOID SURGERY    . HUMERUS FRACTURE SURGERY Left   . MYOMECTOMY    . NASAL SINUS SURGERY    . SHOULDER ARTHROSCOPY WITH SUBACROMIAL DECOMPRESSION AND BICEP TENDON REPAIR Left 07/27/2016   Procedure: SHOULDER ARTHROSCOPY DEBRIDEMENT ROTATOR CUFF AND LABRUM, SUBACROMIAL DECOMPRESSION, BICEPS TENODESIS;  Surgeon: Tania Ade, MD;  Location: [facility];  Service: Orthopedics;  Laterality: Left;  SHOULDER ARTHROSCOPY DEBRIDEMENT ROTATOR CUFF AND LABRUM, SUBACROMIAL DECOMPRESSION, BICEPS TENOTOMY, POSSIBLE TENODESIS  . THUMB ARTHROSCOPY     5 surgeries on left thumb, 4 surgeries on right  . TONSILLECTOMY AND ADENOIDECTOMY      There were no vitals filed for this visit.      Subjective Assessment - 12/21/16 1100    Subjective Patient reports  that her shouldr and arm are feeling better. She had increased pain for a couple of days but noticed yesterday and today that arm feels much better.    Currently in Pain? No/denies            [facility] PT Assessment - 12/21/16 0001      Assessment   Medical Diagnosis Lt shoulder scope, biceps tenotomy & open tenodesis   Referring Provider Dr Tania Ade   Onset Date/Surgical Date 07/27/16   Hand Dominance Right   Next MD Visit 3/18     Observation/Other Assessments   Focus on Therapeutic Outcomes (FOTO)  38% limitation      AROM   Right/Left Shoulder --  AROM in standing ER/IR at ~90 deg abduction    Left Shoulder Extension 64 Degrees   Left Shoulder Flexion 158 Degrees   Left Shoulder ABduction 168 Degrees   Left Shoulder Internal Rotation 44 Degrees   Left Shoulder External Rotation 94 Degrees     Strength   Right/Left Shoulder --  strength not tested resistively     Palpation   Palpation comment improving tenderness and muscular banding/knotted area Lt biceps prominent proximal to proximal incision                      [facility] Adult PT Treatment/Exercise - 12/21/16 0001      Lumbar Exercises: Aerobic  UBE (Upper Arm Bike) L3 X5 min alt fwd/back     Shoulder Exercises: Stretch   Other Shoulder Stretches Standing Lt shoulder/elbow(biceps) stretch x 30 sec x 2 reps   Other Shoulder Stretches doorway stretch 3 positions 30 sec x 2 reps each      Moist Heat Therapy   Number Minutes Moist Heat 15 Minutes   Moist Heat Location Shoulder  Lt     Electrical Stimulation   Electrical Stimulation Location Lt shoulder   Electrical Stimulation Action IFC   Electrical Stimulation Parameters to tolerance   Electrical Stimulation Goals Pain;Tone     Ultrasound   Ultrasound Location Lt anterior arm/shd   Ultrasound Parameters 1.5 w/cm2; 100%; 1 mHz; 8 min    Ultrasound Goals Pain;Other (Comment)  muscular tightness     Iontophoresis   Type of Iontophoresis  Dexamethasone   Location Lt ant arm - deltoid/biceps area    Dose 120 mAmp   Time 8 hours      Manual Therapy   Manual therapy comments pt supine   Soft tissue mobilization Lt anterior arm/biceps - scar massage to proximal incision Lt anterior shoudler   increased tolerance to pressure    Myofascial Release Lt bicep, long head and musculotendinous junction          Trigger Point Dry Needling - 12/21/16 1142    Consent Given? Yes   Muscles Treated Upper Body --  Lt deltoid/biceps x 3 decreased palpable tightness                    PT Long Term Goals - 12/21/16 1059      PT LONG TERM GOAL #1   Title I with advanced HEP to include water classes ( 01/29/17)    Time 12   Period Weeks   Status On-going     PT LONG TERM GOAL #2   Title demo Lt shoulder ROM WFL ( 12/30/15)    Time 6   Period Weeks   Status Achieved     PT LONG TERM GOAL #3   Title increase strength Lt UE to 5-/5 to 5/5 throughout 01/29/17   Time 6   Period Weeks   Status On-going     PT LONG TERM GOAL #4   Title improve FOTO =/< 44% limited ( 12/30/15)    Time 6   Period Weeks   Status Achieved     PT LONG TERM GOAL #5   Title Walk and play with dogs without difficulty ( 01/2317)    Time 12   Period Weeks   Status On-going     PT LONG TERM GOAL #6   Title Decrease tightness and banding Lt anterior arm at deltoid/biceps thus decreasing pain and allowing pt to progress with funcitonal activity level 01/29/17   Time 6   Period Weeks   Status On-going               Plan - 12/21/16 1105    Clinical Impression Statement Excellent reposnse to last treatment including DN; manual work; iontl; stretching. Progressing toward goals of therapy.    Rehab Potential Good   PT Frequency 2x / week   PT Duration 6 weeks   PT Treatment/Interventions Moist Heat;Ultrasound;Therapeutic exercise;Dry needling;Taping;Vasopneumatic Device;Manual techniques;Cryotherapy;Electrical Stimulation;Passive range  of motion;Patient/family education   PT Next Visit Plan continue with trial of DN; modalities; ionto; manual work Psychologist, forensic per MD request    Consulted and Agree with Plan of Care Patient  Patient will benefit from skilled therapeutic intervention in order to improve the following deficits and impairments:  Postural dysfunction, Increased edema, Decreased strength, Pain, Impaired UE functional use, Increased muscle spasms  Visit Diagnosis: Stiffness of left shoulder, not elsewhere classified  Muscle weakness (generalized)  Abnormal posture  Chronic left shoulder pain     Problem List Patient Active Problem List   Diagnosis Date Noted  . Sinusitis 11/30/2016  . History of pulmonary embolus (PE) 07/21/2016  . Right elbow pain 07/21/2016  . Right lateral epicondylitis 07/21/2016  . GAD (generalized anxiety disorder) 07/20/2016  . Primary osteoarthritis of right knee 06/01/2016  . Anxiety state 06/01/2016  . Left shoulder pain 06/01/2016  . Multiple sclerosis ([place]) 05/29/2016  . Fibromyalgia 05/29/2016  . 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 Insomnia 05/29/2016  . Ulcerative pancolitis without complication ([place]) [id number]  . Bipolar 2 disorder ([place]) 05/29/2016  . Current smoker 05/29/2016    Everardo All PT, MPH  12/21/2016, 12:24 PM  [facility] [place], [place], [address] Phone: [phone]   Fax:  [phone]  Name: Argyle Ciccone MRN: [id number] Date of Birth: 10-03-1966

## 2016-12-22 ENCOUNTER — Ambulatory Visit (INDEPENDENT_AMBULATORY_CARE_PROVIDER_SITE_OTHER): Payer: Medicare Other | Admitting: Licensed Clinical Social Worker

## 2016-12-22 DIAGNOSIS — F3181 Bipolar II disorder: Secondary | ICD-10-CM | POA: Diagnosis not present

## 2016-12-22 DIAGNOSIS — F411 Generalized anxiety disorder: Secondary | ICD-10-CM

## 2016-12-22 DIAGNOSIS — F4310 Post-traumatic stress disorder, unspecified: Secondary | ICD-10-CM

## 2016-12-22 NOTE — Progress Notes (Signed)
   THERAPIST PROGRESS NOTE  Session Time: 11:05 AM to 12 PM  Participation Level: Active  Behavioral Response: CasualAlertEuthymic  Type of Therapy: Individual Therapy  Treatment Goals addressed: continue to make positive strides in motivation and attitude, decrease in anxiety,and learning and applying positive coping strategies to cope with anxiety, decrease in PTSD symptoms  Interventions: CBT, Solution Focused, Strength-based, Supportive and Other: Stress management  Summary: Paige Lozano is a 51 y.o. female who presents with describing wise things were so difficult for her last week. Described that one of her frustrations is frustrations with medical providers not always helpful. Shared her feelings as miserable, so angry, so sad and in pain. She has pain now from ankle to hip because of, injections and worried the impact of her flare up in December will have. Wanted to give her Ritalin for fatigue when putting on new MS med but found out not  not good for bipolar. She is realizing she needs to as even though she is a person that likes to go she needs her down time and this helps her to connect spiritually and rest. She also saw something that keeping 40% happy helps with happiness so she is going to make sure to keep busy, it will help her stay out of her head, and give her purpose in life, two days off, keeping busy stays out of head, have a purpose in life. She is working so not being at [employer] and and not letting herself get into symptoms but at the same time figuring out the right time to give herself rest with MS. described spirituality is helpful because it easy for her to go negative. She explored feelings of relationship with her ex, she was open and honest about how she felt, recognizes they are each others significant support and shared positive experiences from the relationship.   Suicidal/Homicidal: No  Therapist Response: Reviewed progress and symptoms including difficulties  she has with medical issues. Discussed coping strategies and ways she is working through it that have been helpful to her. Discussed helpful attitudes that will help her in dealing with pressure she puts on herself and how she deals with medical providers including wishing herself but at the same time accepting she's doing what she can. Provided positive feedback for patient's recognition of exercise is helpful, being active to help with mood and giving herself time to rest and spending time with spirituality as helpful for her. Helped her process feelings related to her relationships and provided supportive and strength-based interventions.   Plan: Return again in 1 week.2. Patient still to complete the CL-5 to address PTSD symptoms.3. Patient continue to work on coping strategies to manage mood and stress and implement into life situations  Diagnosis: Axis I: bipolar 2 disorder, most recent episode depressed, PTSD, generalized anxiety disorder    Axis II: No diagnosis    Lozano,Paige A, LCSW 12/22/2016

## 2016-12-23 ENCOUNTER — Ambulatory Visit (INDEPENDENT_AMBULATORY_CARE_PROVIDER_SITE_OTHER): Payer: Medicare Other | Admitting: Physical Therapy

## 2016-12-23 ENCOUNTER — Encounter: Payer: Self-pay | Admitting: Physical Therapy

## 2016-12-23 DIAGNOSIS — R293 Abnormal posture: Secondary | ICD-10-CM

## 2016-12-23 DIAGNOSIS — M6281 Muscle weakness (generalized): Secondary | ICD-10-CM | POA: Diagnosis not present

## 2016-12-23 DIAGNOSIS — M25612 Stiffness of left shoulder, not elsewhere classified: Secondary | ICD-10-CM | POA: Diagnosis present

## 2016-12-23 DIAGNOSIS — Z79899 Other long term (current) drug therapy: Secondary | ICD-10-CM | POA: Diagnosis not present

## 2016-12-23 NOTE — Therapy (Signed)
[facility], [place], [address] Phone: [phone]   Fax:  [phone]  Physical Therapy Treatment  Patient Details  Name: Paige Lozano MRN: [id number] Date of Birth: 10/18/1966 Referring Provider: Dr Tania Ade  Encounter Date: 12/23/2016      PT End of Session - 12/23/16 1426    Visit Number 26   Number of Visits 36   Date for PT Re-Evaluation 01/29/17   PT Start Time 1408   PT Stop Time 1432   PT Time Calculation (min) 24 min      Past Medical History:  Diagnosis Date  . Anxiety   . Bipolar 2 disorder, major depressive episode ([employer])   . Complication of anesthesia   . COPD (chronic obstructive pulmonary disease) ([place])    smoker  . Fibromyalgia   . Insomnia   . Multiple sclerosis ([place])   . PONV (postoperative nausea and vomiting)   . Pulmonary embolism ([place]) 2000  . Sleep apnea    mild OSA no CPAP  . Ulcerative colitis [facility])     Past Surgical History:  Procedure Laterality Date  . FOOT SURGERY Left    bone spur  . HEMORRHOID SURGERY    . HUMERUS FRACTURE SURGERY Left   . MYOMECTOMY    . NASAL SINUS SURGERY    . SHOULDER ARTHROSCOPY WITH SUBACROMIAL DECOMPRESSION AND BICEP TENDON REPAIR Left 07/27/2016   Procedure: SHOULDER ARTHROSCOPY DEBRIDEMENT ROTATOR CUFF AND LABRUM, SUBACROMIAL DECOMPRESSION, BICEPS TENODESIS;  Surgeon: Tania Ade, MD;  Location: [facility];  Service: Orthopedics;  Laterality: Left;  SHOULDER ARTHROSCOPY DEBRIDEMENT ROTATOR CUFF AND LABRUM, SUBACROMIAL DECOMPRESSION, BICEPS TENOTOMY, POSSIBLE TENODESIS  . THUMB ARTHROSCOPY     5 surgeries on left thumb, 4 surgeries on right  . TONSILLECTOMY AND ADENOIDECTOMY      There were no vitals filed for this visit.      Subjective Assessment - 12/23/16 1410    Subjective Got in the pool the last couple of days and the arm is feeling pretty good.    Patient Stated Goals return to swimming,  water classes, walk dogs.    Currently in Pain? No/denies  tenderness in the shoulder, not registered on scale                         [facility] Adult PT Treatment/Exercise - 12/23/16 0001      Shoulder Exercises: Stretch   Other Shoulder Stretches with strap in door, flexion, extension, pecs   Other Shoulder Stretches doorway stretch 3 positions 30 sec x 2 reps each      Ultrasound   Ultrasound Location Lt anterior shoulder    Ultrasound Parameters 1.41mHz, 50%, 1.0w/cm2   Ultrasound Goals Pain     Iontophoresis   Type of Iontophoresis Dexamethasone   Location Lt ant arm - deltoid/biceps area    Dose 120 mAmp   Time 8 hours                      PT Long Term Goals - 12/21/16 1059      PT LONG TERM GOAL #1   Title I with advanced HEP to include water classes ( 01/29/17)    Time 12   Period Weeks   Status On-going     PT LONG TERM GOAL #2   Title demo Lt shoulder ROM WFL ( 12/30/15)    Time 6   Period Weeks  Status Achieved     PT LONG TERM GOAL #3   Title increase strength Lt UE to 5-/5 to 5/5 throughout 01/29/17   Time 6   Period Weeks   Status On-going     PT LONG TERM GOAL #4   Title improve FOTO =/< 44% limited ( 12/30/15)    Time 6   Period Weeks   Status Achieved     PT LONG TERM GOAL #5   Title Walk and play with dogs without difficulty ( 01/2317)    Time 12   Period Weeks   Status On-going     PT LONG TERM GOAL #6   Title Decrease tightness and banding Lt anterior arm at deltoid/biceps thus decreasing pain and allowing pt to progress with funcitonal activity level 01/29/17   Time 6   Period Weeks   Status On-going               Plan - 12/23/16 1427    Clinical Impression Statement Paige Lozano is feeling pretty good today. Wants to hold on DN today and resume at next visit.   Rehab Potential Good   PT Frequency 2x / week   PT Duration 6 weeks   PT Treatment/Interventions Moist Heat;Ultrasound;Therapeutic exercise;Dry  needling;Taping;Vasopneumatic Device;Manual techniques;Cryotherapy;Electrical Stimulation;Passive range of motion;Patient/family education   PT Next Visit Plan continue with trial of DN; modalities; ionto; manual work Psychologist, forensic per MD request    Consulted and Agree with Plan of Care Patient      Patient will benefit from skilled therapeutic intervention in order to improve the following deficits and impairments:  Postural dysfunction, Increased edema, Decreased strength, Pain, Impaired UE functional use, Increased muscle spasms  Visit Diagnosis: Stiffness of left shoulder, not elsewhere classified  Muscle weakness (generalized)  Abnormal posture     Problem List Patient Active Problem List   Diagnosis Date Noted  . Sinusitis 11/30/2016  . History of pulmonary embolus (PE) 07/21/2016  . Right elbow pain 07/21/2016  . Right lateral epicondylitis 07/21/2016  . GAD (generalized anxiety disorder) 07/20/2016  . Primary osteoarthritis of right knee 06/01/2016  . Anxiety state 06/01/2016  . Left shoulder pain 06/01/2016  . Multiple sclerosis ([place]) 05/29/2016  . Fibromyalgia 05/29/2016  . 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 Insomnia 05/29/2016  . Ulcerative pancolitis without complication ([place]) [id number]  . Bipolar 2 disorder ([place]) 05/29/2016  . Current smoker 05/29/2016    Jeral Pinch PT 12/23/2016, 4:17 PM  [facility] [place], [place], [address] Phone: [phone]   Fax:  [phone]  Name: Paige Lozano MRN: [id number] Date of Birth: 1966-02-27

## 2016-12-24 ENCOUNTER — Ambulatory Visit (INDEPENDENT_AMBULATORY_CARE_PROVIDER_SITE_OTHER): Payer: Medicare Other

## 2016-12-24 ENCOUNTER — Ambulatory Visit (INDEPENDENT_AMBULATORY_CARE_PROVIDER_SITE_OTHER): Payer: Medicare Other | Admitting: Family Medicine

## 2016-12-24 VITALS — BP 132/86 | HR 115 | Temp 98.5°F | Wt 187.0 lb

## 2016-12-24 DIAGNOSIS — S6992XA Unspecified injury of left wrist, hand and finger(s), initial encounter: Secondary | ICD-10-CM

## 2016-12-24 DIAGNOSIS — W19XXXA Unspecified fall, initial encounter: Secondary | ICD-10-CM | POA: Diagnosis not present

## 2016-12-24 DIAGNOSIS — M7989 Other specified soft tissue disorders: Secondary | ICD-10-CM | POA: Diagnosis not present

## 2016-12-24 MED ORDER — HYDROCODONE-ACETAMINOPHEN 10-325 MG PO TABS: 0.5000 | ORAL_TABLET | Freq: Three times a day (TID) | ORAL | 0 refills | Status: DC | PRN

## 2016-12-24 NOTE — Patient Instructions (Signed)
Thank you for coming in today. Use the [facility] for severe pain.  Follow up with me or Dr T in about 1 week to repeat xray and apply a cast.  Return sooner if needed.    Cast or Splint Care, Adult Casts and splints are supports that are worn to protect broken bones and other injuries. A cast or splint may hold a bone still and in the correct position while it heals. Casts and splints may also help ease pain, swelling, and muscle spasms. A cast is a hardened support that is usually made of fiberglass or plaster. It is custom-fit to the body and it offers more protection than a splint. It cannot be taken off and put back on. A splint is a type of soft support that is usually made from cloth and elastic. It can be adjusted or taken off as needed. You may need a cast or a splint if you:  Have a broken bone.  Have a soft-tissue injury.  Need to keep an injured body part from moving (keep it immobile) after surgery. How is this treated? If you have a cast:  Do not stick anything inside the cast to scratch your skin. Sticking something in the cast increases your risk of infection.  Check the skin around the cast every day. Tell your health care provider about any concerns.  You may put lotion on dry skin around the edges of the cast. Do not put lotion on the skin underneath the cast.  Keep the cast clean.  If the cast is not waterproof:  Do not let it get wet.  Cover it with a watertight covering when you take a bath or a shower. If you have a splint:  Wear it as told by your health care provider. Remove it only as told by your health care provider.  Loosen the splint if your fingers or toes tingle, become numb, or turn cold and blue.  Keep the splint clean.  If the splint is not waterproof:  Do not let it get wet.  Cover it with a watertight covering when you take a bath or a shower. Bathing  Do not take baths or swim until your health care provider approves. Ask your health  care provider if you can take showers. You may only be allowed to take sponge baths for bathing.  If your cast or splint is not waterproof, cover it with a watertight covering when you take a bath or shower. Managing pain, stiffness, and swelling  Move your fingers or toes often to avoid stiffness and to lessen swelling.  Raise (elevate) the injured area above the level of your heart while sitting or lying down. Safety  Do not use the injured limb to support your body weight until your health care provider says that it is okay.  Use crutches or other assistive devices as told by your health care provider. General instructions  Do not put pressure on any part of the cast or splint until it is fully hardened. This may take several hours.  Return to your normal activities as told by your health care provider. Ask your health care provider what activities are safe for you.  Take over-the-counter and prescription medicines only as told by your health care provider.  Keep all follow-up visits as told by your health care provider. This is important. Contact a health care provider if:  Your cast or splint gets damaged.  The skin around the cast gets red or raw.  The skin under the cast is extremely itchy or painful.  Your cast or splint feels very uncomfortable.  Your cast or splint is too tight or too loose.  Your cast becomes wet or it develops a soft spot or area.  You get an object stuck under your cast. Get help right away if:  Your pain is getting worse.  The injured area tingles, becomes numb, or turns cold and blue.  The part of your body above or below the cast is swollen and discolored.  You cannot feel or move your fingers or toes.  There is fluid leaking through the cast.  You have severe pain or pressure under the cast.  You have trouble breathing.  You have shortness of breath.  You have chest pain. This information is not intended to replace advice given  to you by your health care provider. Make sure you discuss any questions you have with your health care provider. Document Released: 10/23/2000 Document Revised: 05/16/2016 Document Reviewed: 04/18/2016 Elsevier Interactive Patient Education  2017 [employer].

## 2016-12-24 NOTE — Progress Notes (Signed)
Pt fell and injured left hand/ thumb

## 2016-12-24 NOTE — Progress Notes (Signed)
Paige Lozano is a 51 y.o. female who presents to [facility] today for left hand injury. Patient fell last night when she tripped on a rug after taking her MS medications. She landed on her left hand and knee and noted no pain at the time. At night, she woke up with left thumb numbness and tingling but attributed it to her MS. When she woke up, her left thumb was bruised, swollen, and painful. She has a history of multiple surgeries to her left hand and thumb previously. She notes that she had a fusion at the MCP. She has not tried much for pain. She denies any radiating pain or weakness   Past Medical History:  Diagnosis Date  . Anxiety   . Bipolar 2 disorder, major depressive episode ([place])   . Complication of anesthesia   . COPD (chronic obstructive pulmonary disease) ([place])    smoker  . Fibromyalgia   . Insomnia   . Multiple sclerosis ([place])   . PONV (postoperative nausea and vomiting)   . Pulmonary embolism ([place]) 2000  . Sleep apnea    mild OSA no CPAP  . Ulcerative colitis [facility])    Past Surgical History:  Procedure Laterality Date  . FOOT SURGERY Left    bone spur  . HEMORRHOID SURGERY    . HUMERUS FRACTURE SURGERY Left   . MYOMECTOMY    . NASAL SINUS SURGERY    . SHOULDER ARTHROSCOPY WITH SUBACROMIAL DECOMPRESSION AND BICEP TENDON REPAIR Left 07/27/2016   Procedure: SHOULDER ARTHROSCOPY DEBRIDEMENT ROTATOR CUFF AND LABRUM, SUBACROMIAL DECOMPRESSION, BICEPS TENODESIS;  Surgeon: Tania Ade, MD;  Location: [facility];  Service: Orthopedics;  Laterality: Left;  SHOULDER ARTHROSCOPY DEBRIDEMENT ROTATOR CUFF AND LABRUM, SUBACROMIAL DECOMPRESSION, BICEPS TENOTOMY, POSSIBLE TENODESIS  . THUMB ARTHROSCOPY     5 surgeries on left thumb, 4 surgeries on right  . TONSILLECTOMY AND ADENOIDECTOMY     Social History  Substance Use Topics  . Smoking status: Former Smoker    Quit date: 07/08/2016  . Smokeless tobacco: Never  Used     Comment: currently taking Chantix  . Alcohol use 0.0 oz/week     Comment: rare      ROS:  As above   Medications: Current Outpatient Prescriptions  Medication Sig Dispense Refill  . albuterol (PROAIR HFA) 108 (90 Base) MCG/ACT inhaler Inhale 2 puffs into the lungs every 6 (six) hours as needed.    . ALPRAZolam (XANAX) 0.5 MG tablet take 1 tablet by mouth twice a day if needed for anxiety 45 tablet 0  . balsalazide (COLAZAL) 750 MG capsule Take 2,250 mg by mouth 3 (three) times daily.    . diazepam (VALIUM) 5 MG tablet Take 1 tab PO 1 hour before procedure or imaging. 2 tablet 0  . Diclofenac Sodium (PENNSAID) 2 % SOLN 2 pumps twice a day application to painful area. 2 Bottle 1  . Fingolimod HCl (GILENYA) 0.5 MG CAPS Take 0.5 capsules by mouth daily.    . fluticasone (FLONASE) 50 MCG/ACT nasal spray Place 2 sprays into both nostrils daily. 16 g 3  . HYDROcodone-acetaminophen (NORCO) 10-325 MG tablet Take 0.5-2 tablets by mouth every 8 (eight) hours as needed. 30 tablet 0  . meloxicam (MOBIC) 15 MG tablet Take 1 tablet (15 mg total) by mouth daily. 30 tablet 2  . methylphenidate (RITALIN) 10 MG tablet Take 10 mg by mouth 2 (two) times daily.    . mupirocin ointment (BACTROBAN) 2 %  Apply topically 3 (three) times daily. 30 g 3  . OLANZapine (ZYPREXA) 2.5 MG tablet Take 1 tablet (2.5 mg total) by mouth daily. 30 tablet 0  . pregabalin (LYRICA) 50 MG capsule Take 1 capsule (50 mg total) by mouth 3 (three) times daily. 1 tab daily for a week then twice a day for a week then 3 times a day 21 capsule 0  . traZODone (DESYREL) 50 MG tablet take 1 tablet by mouth at bedtime if needed for sleep 30 tablet 1  . varenicline (CHANTIX CONTINUING MONTH PAK) 1 MG tablet Take 1 tablet (1 mg total) by mouth 2 (two) times daily. 60 tablet 5  . zolpidem (AMBIEN) 10 MG tablet Take 1 tablet (10 mg total) by mouth at bedtime as needed for sleep. 30 tablet 1   No current facility-administered  medications for this visit.    Allergies  Allergen Reactions  . Ketamine Anaphylaxis  . Oxycodone Diarrhea  . Papaya Enzyme Anaphylaxis     Exam:  BP 132/86 (BP Location: Right Arm, Patient Position: Sitting, Cuff Size: Normal)   Pulse (!) 115   Temp 98.5 F (36.9 C) (Oral)   Wt 187 lb (84.8 kg)   SpO2 95%   BMI 28.43 kg/m  General: Well Developed, well nourished, and in no acute distress.  Neuro/Psych: Alert and oriented x3, extra-ocular muscles intact, able to move all 4 extremities, sensation grossly intact. Skin: Warm and dry, no rashes noted.  Respiratory: Not using accessory muscles, speaking in full sentences, trachea midline.  Cardiovascular: Pulses palpable, no extremity edema. Abdomen: Does not appear distended. MSK: Left hand significant ecchymosis at the thenar eminence. She has minimal deviation at the MCP with well appearing mature scars across the MCP.    Xray wrist and hand show intact hardware and evidence of MCP fusion 1st MCP.   There appears to be a small fracture line through the MCP however post op changes cannot be excluded.  Awaiting formal radiology review.   No results found for this or any previous visit (from the past 48 hour(s)). No results found.    Assessment and Plan: 51 y.o. female with left thumb injury concerned for fracture based on swelling, ecchymosis and pain and xray findings.  A well formed thumb spica splint was applied.  Pt was also given a 60mg  toradol shot prior to D/C. Pain management with Norco.  F/u in 1 week.       Orders Placed This Encounter  Procedures  . DG Hand Complete Left    Standing Status:   Future    Number of Occurrences:   1    Standing Expiration Date:   02/21/2018    Order Specific Question:   Reason for Exam (SYMPTOM  OR DIAGNOSIS REQUIRED)    Answer:   eval thumb injury    Order Specific Question:   Is patient pregnant?    Answer:   No    Order Specific Question:   Preferred imaging location?     Answer:   [facility]  . DG Wrist Complete Left    Standing Status:   Future    Number of Occurrences:   1    Standing Expiration Date:   02/21/2018    Order Specific Question:   Reason for Exam (SYMPTOM  OR DIAGNOSIS REQUIRED)    Answer:   left thum band wrist injury    Order Specific Question:   Is patient pregnant?    Answer:   No  Order Specific Question:   Preferred imaging location?    Answer:   [facility]    Discussed warning signs or symptoms. Please see discharge instructions. Patient expresses understanding.

## 2016-12-25 ENCOUNTER — Encounter: Payer: Self-pay | Admitting: Sports Medicine

## 2016-12-25 ENCOUNTER — Ambulatory Visit (INDEPENDENT_AMBULATORY_CARE_PROVIDER_SITE_OTHER): Payer: Medicare Other | Admitting: Sports Medicine

## 2016-12-25 ENCOUNTER — Encounter: Payer: Self-pay | Admitting: Physical Therapy

## 2016-12-25 DIAGNOSIS — M1711 Unilateral primary osteoarthritis, right knee: Secondary | ICD-10-CM

## 2016-12-25 DIAGNOSIS — R9082 White matter disease, unspecified: Secondary | ICD-10-CM | POA: Diagnosis not present

## 2016-12-25 DIAGNOSIS — G35 Multiple sclerosis: Secondary | ICD-10-CM | POA: Diagnosis not present

## 2016-12-25 DIAGNOSIS — M1812 Unilateral primary osteoarthritis of first carpometacarpal joint, left hand: Secondary | ICD-10-CM

## 2016-12-25 NOTE — Progress Notes (Signed)
  Procedure: Real-time Ultrasound Guided Injection of right knee Device: GE Logiq E  Verbal informed consent obtained.  Time-out conducted.  Noted no overlying erythema, induration, or other signs of local infection.  Skin prepped in a sterile fashion.  Local anesthesia: Topical Ethyl chloride.  With sterile technique and under real time ultrasound guidance:   30 mg/2 mL of OrthoVisc (sodium hyaluronate) in a prefilled syringe was injected easily into the knee through a 22-gauge needle. Completed without difficulty  Pain immediately resolved suggesting accurate placement of the medication.  Advised to call if fevers/chills, erythema, induration, drainage, or persistent bleeding.  Images permanently stored and available for review in the ultrasound unit.  Impression: Technically successful ultrasound guided injection. 

## 2016-12-25 NOTE — Assessment & Plan Note (Signed)
Suspect fracture through the left first proximal phalanx. X-rays were only a bit inconclusive.  Continue thumb spica splint, I can apply thumb spica cast at the next visit if good resolution of the swelling. Otherwise we will continue thumb spica splint.

## 2016-12-25 NOTE — Assessment & Plan Note (Signed)
Orthovisc injection #3 into the right knee.  Return in one week for #4.

## 2016-12-28 ENCOUNTER — Ambulatory Visit (INDEPENDENT_AMBULATORY_CARE_PROVIDER_SITE_OTHER): Payer: Medicare Other | Admitting: Physician Assistant

## 2016-12-28 ENCOUNTER — Encounter: Payer: Self-pay | Admitting: Physical Therapy

## 2016-12-28 ENCOUNTER — Encounter: Payer: Self-pay | Admitting: Physician Assistant

## 2016-12-28 ENCOUNTER — Other Ambulatory Visit: Payer: Self-pay | Admitting: *Deleted

## 2016-12-28 VITALS — BP 120/79 | HR 92

## 2016-12-28 DIAGNOSIS — L299 Pruritus, unspecified: Secondary | ICD-10-CM | POA: Diagnosis not present

## 2016-12-28 DIAGNOSIS — R52 Pain, unspecified: Secondary | ICD-10-CM | POA: Diagnosis not present

## 2016-12-28 DIAGNOSIS — M7989 Other specified soft tissue disorders: Secondary | ICD-10-CM | POA: Diagnosis not present

## 2016-12-28 DIAGNOSIS — R232 Flushing: Secondary | ICD-10-CM

## 2016-12-28 LAB — CBC WITH DIFFERENTIAL/PLATELET
Basophils Absolute: 0 {cells}/uL (ref 0–200)
Basophils Relative: 0 %
Eosinophils Absolute: 172 {cells}/uL (ref 15–500)
Eosinophils Relative: 4 %
HCT: 36.8 % (ref 35.0–45.0)
Hemoglobin: 12.8 g/dL (ref 11.7–15.5)
Lymphocytes Relative: 14 %
Lymphs Abs: 602 {cells}/uL — ABNORMAL LOW (ref 850–3900)
MCH: 29.8 pg (ref 27.0–33.0)
MCHC: 34.8 g/dL (ref 32.0–36.0)
MCV: 85.6 fL (ref 80.0–100.0)
MPV: 9.7 fL (ref 7.5–12.5)
Monocytes Absolute: 430 {cells}/uL (ref 200–950)
Monocytes Relative: 10 %
Neutro Abs: 3096 {cells}/uL (ref 1500–7800)
Neutrophils Relative %: 72 %
Platelets: 205 K/uL (ref 140–400)
RBC: 4.3 MIL/uL (ref 3.80–5.10)
RDW: 14.1 % (ref 11.0–15.0)
WBC: 4.3 K/uL (ref 3.8–10.8)

## 2016-12-28 LAB — COMPLETE METABOLIC PANEL WITH GFR
ALT: 33 U/L — ABNORMAL HIGH (ref 6–29)
AST: 44 U/L — ABNORMAL HIGH (ref 10–35)
Albumin: 4 g/dL (ref 3.6–5.1)
Alkaline Phosphatase: 69 U/L (ref 33–130)
BUN: 14 mg/dL (ref 7–25)
CO2: 27 mmol/L (ref 20–31)
Calcium: 9 mg/dL (ref 8.6–10.4)
Chloride: 104 mmol/L (ref 98–110)
Creat: 0.75 mg/dL (ref 0.50–1.05)
GFR, Est African American: >89 mL/min (ref >=60)
Glucose, Bld: 82 mg/dL (ref 65–99)
POTASSIUM: 4.3 mmol/L (ref 3.5–5.3)
SODIUM: 136 mmol/L (ref 135–146)
TOTAL PROTEIN: 6.2 g/dL (ref 6.1–8.1)
Total Bilirubin: 0.3 mg/dL (ref 0.2–1.2)

## 2016-12-28 LAB — FOLLICLE STIMULATING HORMONE: FSH: 75.4 m[IU]/mL

## 2016-12-28 MED ORDER — FLUCONAZOLE 150 MG PO TABS: 150.0000 mg | ORAL_TABLET | Freq: Once | ORAL | 1 refills | Status: AC

## 2016-12-28 MED ORDER — HYDROXYZINE HCL 25 MG PO TABS: 25.0000 mg | ORAL_TABLET | Freq: Three times a day (TID) | ORAL | 0 refills | Status: DC | PRN

## 2016-12-28 NOTE — Progress Notes (Signed)
Call pt: liver enzymes elevated mildly. Could be due to norco as well. Once finish taking will recheck in or in 1 month which ever is first.

## 2016-12-28 NOTE — Patient Instructions (Signed)
Possible withdrawal symptoms from norco or lyrica.  Will check labs to clear for infection.  Start vistaril for itching and prednisone for sinus pressure.

## 2016-12-28 NOTE — Progress Notes (Signed)
   Subjective:    Patient ID: Paige Lozano, female    DOB: [birth year], 51 y.o.   MRN: [id number]  HPI  Pt is a 51 yo female who presents to the clinic with itching, body aches, hot flashes, right hand swelling for the last week. She is on norco for right thumb pain/fracture. She finished abx about 1 week ago. Her sinus pressure feels much better has not taken prednisone that was given.  She admits to stopping lyrica 2-4 days ago because she ran out. She has also been trying to push out of norco even though she is in pain. She denies any fever. She just feels terrible. As she was sitting in room she had a hot flash. She is still having sporadic periods. She has scratched up and down spine and created scabs. Benadryl is helping some for itching.    Review of Systems  All other systems reviewed and are negative.      Objective:   Physical Exam  Constitutional: She is oriented to person, place, and time. She appears well-developed and well-nourished.  HENT:  Head: Normocephalic and atraumatic.  Right Ear: External ear normal.  Left Ear: External ear normal.  Nose: Nose normal.  Mouth/Throat: Oropharynx is clear and moist. No oropharyngeal exudate.  TM's clear.   Eyes: Conjunctivae are normal.  Neck: Normal range of motion. Neck supple.  Cardiovascular: Normal rate, regular rhythm and normal heart sounds.   Pulmonary/Chest: Effort normal and breath sounds normal. She has no wheezes.  Lymphadenopathy:    She has no cervical adenopathy.  Neurological: She is alert and oriented to person, place, and time.  Skin: Skin is dry.  Right hand and fingers scant edema.   Psychiatric: She has a normal mood and affect. Her behavior is normal.          Assessment & Plan:  Marland KitchenMarland KitchenDiagnoses and all orders for this visit:  Itching -     FSH -     CBC with Differential/Platelet -     COMPLETE METABOLIC PANEL WITH GFR -     hydrOXYzine (ATARAX/VISTARIL) 25 MG tablet; Take 1 tablet (25 mg total) by  mouth 3 (three) times daily as needed for itching.  Swelling of right hand -     FSH -     CBC with Differential/Platelet -     COMPLETE METABOLIC PANEL WITH GFR  Body aches -     FSH -     CBC with Differential/Platelet -     COMPLETE METABOLIC PANEL WITH GFR  Hot flashes -     FSH -     CBC with Differential/Platelet -     COMPLETE METABOLIC PANEL WITH GFR   Unclear etiology.  Hot flashes could be somewhat related to menopause but unlikely that it has worsened this much in one week. Will check FSH.   Discussed could be viral but no fever or any URI symptoms present.   She did abruptly stop lyrica and seems to be a link with worsening symptoms and pushing out norco timing. Concerned some of these symptoms could be related to withdrawal. Discussed with patient.   Will get labs and continue to monitor symptoms.   Vistaril given for itching.

## 2016-12-29 DIAGNOSIS — R52 Pain, unspecified: Secondary | ICD-10-CM | POA: Insufficient documentation

## 2016-12-29 DIAGNOSIS — L299 Pruritus, unspecified: Secondary | ICD-10-CM | POA: Insufficient documentation

## 2016-12-29 DIAGNOSIS — S6991XA Unspecified injury of right wrist, hand and finger(s), initial encounter: Secondary | ICD-10-CM | POA: Insufficient documentation

## 2016-12-29 DIAGNOSIS — J310 Chronic rhinitis: Secondary | ICD-10-CM | POA: Diagnosis not present

## 2016-12-29 DIAGNOSIS — R232 Flushing: Secondary | ICD-10-CM | POA: Insufficient documentation

## 2016-12-29 DIAGNOSIS — Z9889 Other specified postprocedural states: Secondary | ICD-10-CM | POA: Diagnosis not present

## 2016-12-30 ENCOUNTER — Encounter: Payer: Self-pay | Admitting: Physical Therapy

## 2016-12-30 ENCOUNTER — Ambulatory Visit (INDEPENDENT_AMBULATORY_CARE_PROVIDER_SITE_OTHER): Payer: Medicare Other | Admitting: Sports Medicine

## 2016-12-30 ENCOUNTER — Ambulatory Visit (INDEPENDENT_AMBULATORY_CARE_PROVIDER_SITE_OTHER): Payer: Medicare Other

## 2016-12-30 ENCOUNTER — Encounter: Payer: Self-pay | Admitting: Sports Medicine

## 2016-12-30 DIAGNOSIS — S6991XA Unspecified injury of right wrist, hand and finger(s), initial encounter: Secondary | ICD-10-CM

## 2016-12-30 DIAGNOSIS — S6992XA Unspecified injury of left wrist, hand and finger(s), initial encounter: Secondary | ICD-10-CM | POA: Diagnosis not present

## 2016-12-30 DIAGNOSIS — M1812 Unilateral primary osteoarthritis of first carpometacarpal joint, left hand: Secondary | ICD-10-CM

## 2016-12-30 DIAGNOSIS — Y798 Miscellaneous orthopedic devices associated with adverse incidents, not elsewhere classified: Secondary | ICD-10-CM | POA: Diagnosis not present

## 2016-12-30 DIAGNOSIS — T84038A Mechanical loosening of other internal prosthetic joint, initial encounter: Secondary | ICD-10-CM | POA: Diagnosis not present

## 2016-12-30 DIAGNOSIS — M79641 Pain in right hand: Secondary | ICD-10-CM

## 2016-12-30 DIAGNOSIS — W19XXXA Unspecified fall, initial encounter: Secondary | ICD-10-CM

## 2016-12-30 DIAGNOSIS — S6992XD Unspecified injury of left wrist, hand and finger(s), subsequent encounter: Secondary | ICD-10-CM

## 2016-12-30 DIAGNOSIS — S62292A Other fracture of first metacarpal bone, left hand, initial encounter for closed fracture: Secondary | ICD-10-CM | POA: Diagnosis not present

## 2016-12-30 DIAGNOSIS — M25531 Pain in right wrist: Secondary | ICD-10-CM | POA: Diagnosis not present

## 2016-12-30 DIAGNOSIS — M79645 Pain in left finger(s): Secondary | ICD-10-CM

## 2016-12-30 MED ORDER — HYDROCODONE-ACETAMINOPHEN 10-325 MG PO TABS: 0.5000 | ORAL_TABLET | Freq: Three times a day (TID) | ORAL | 0 refills | Status: DC | PRN

## 2016-12-30 NOTE — Assessment & Plan Note (Signed)
After a fall 6 days ago with tenderness over the ulnar styloid process. X-rays, refilling hydrocodone. Velcro wrist brace.

## 2016-12-30 NOTE — Progress Notes (Signed)
   Subjective:    I'm seeing this patient as a consultation for:  Paige Planas, PA-C  CC: Right wrist injury  HPI: A week ago this pleasant 51 year old female fell onto an outstretched right hand, she had immediate pain, swelling, mild bruising, pain is localized over the ulnar styloid. Has not yet had any imaging studies for this, pain is moderate, persistent.   Left hand pain: After a recent fall, she does have postsurgical changes in the thumb, but has such severe arthritis with effusion at the interphalangeal joint that it's tough to determine if she had a fracture or not. Pain continues to be severe despite thumb spica immobilization, and she is agreeable to proceed with advanced imaging for further evaluation.    Past medical history:  Negative.  See flowsheet/record as well for more information.  Surgical history: Negative.  See flowsheet/record as well for more information.  Family history: Negative.  See flowsheet/record as well for more information.  Social history: Negative.  See flowsheet/record as well for more information.  Allergies, and medications have been entered into the medical record, reviewed, and no changes needed.   Review of Systems: No headache, visual changes, nausea, vomiting, diarrhea, constipation, dizziness, abdominal pain, skin rash, fevers, chills, night sweats, weight loss, swollen lymph nodes, body aches, joint swelling, muscle aches, chest pain, shortness of breath, mood changes, visual or auditory hallucinations.   Objective:   General: Well Developed, well nourished, and in no acute distress.  Neuro/Psych: Alert and oriented x3, extra-ocular muscles intact, able to move all 4 extremities, sensation grossly intact. Skin: Warm and dry, no rashes noted.  Respiratory: Not using accessory muscles, speaking in full sentences, trachea midline.  Cardiovascular: Pulses palpable, no extremity edema. Abdomen: Does not appear distended. Right Wrist: Visibly  swollen, slightly bruised with tenderness at the tip of the ulnar styloid process ROM smooth and normal with good flexion and extension and ulnar/radial deviation that is symmetrical with opposite wrist. Palpation is normal over metacarpals, navicular, lunate, and TFCC; tendons without tenderness/ swelling No snuffbox tenderness. No tenderness over Canal of [place]. Strength 5/5 in all directions without pain. Negative Finkelstein, tinel's and phalens. Negative Watson's test.  Impression and Recommendations:   This case required medical decision making of moderate complexity.  Right wrist injury After a fall 6 days ago with tenderness over the ulnar styloid process. X-rays, refilling hydrocodone. Velcro wrist brace.  Hand injury, left, subsequent encounter Question fracture through the thumb, she does have postoperative changes here, and it's difficult to determine whether the x-ray findings are a true fracture. She will continue her thumb spica brace and I am going to go ahead and obtain a CT of her left hand.

## 2016-12-30 NOTE — Assessment & Plan Note (Signed)
Question fracture through the thumb, she does have postoperative changes here, and it's difficult to determine whether the x-ray findings are a true fracture. She will continue her thumb spica brace and I am going to go ahead and obtain a CT of her left hand.

## 2017-01-01 ENCOUNTER — Encounter: Payer: Self-pay | Admitting: Sports Medicine

## 2017-01-01 ENCOUNTER — Ambulatory Visit (INDEPENDENT_AMBULATORY_CARE_PROVIDER_SITE_OTHER): Payer: Medicare Other | Admitting: Licensed Clinical Social Worker

## 2017-01-01 ENCOUNTER — Ambulatory Visit (INDEPENDENT_AMBULATORY_CARE_PROVIDER_SITE_OTHER): Payer: Medicare Other | Admitting: Sports Medicine

## 2017-01-01 ENCOUNTER — Encounter: Payer: Self-pay | Admitting: Rehabilitative and Restorative Service Providers"

## 2017-01-01 DIAGNOSIS — F4310 Post-traumatic stress disorder, unspecified: Secondary | ICD-10-CM | POA: Diagnosis not present

## 2017-01-01 DIAGNOSIS — S6991XD Unspecified injury of right wrist, hand and finger(s), subsequent encounter: Secondary | ICD-10-CM | POA: Diagnosis not present

## 2017-01-01 DIAGNOSIS — M1711 Unilateral primary osteoarthritis, right knee: Secondary | ICD-10-CM | POA: Diagnosis not present

## 2017-01-01 DIAGNOSIS — S6992XD Unspecified injury of left wrist, hand and finger(s), subsequent encounter: Secondary | ICD-10-CM

## 2017-01-01 DIAGNOSIS — F3181 Bipolar II disorder: Secondary | ICD-10-CM | POA: Diagnosis not present

## 2017-01-01 DIAGNOSIS — F411 Generalized anxiety disorder: Secondary | ICD-10-CM

## 2017-01-01 NOTE — Progress Notes (Signed)
   THERAPIST PROGRESS NOTE  Session Time: 11 AM to 11:55 AM  Participation Level: Active  Behavioral Response: CasualAlertDysphoric and Tearful at times, frustrated  Type of Therapy: Individual Therapy  Treatment Goals addressed:  ontinue to make positive strides in motivation and attitude, decrease in anxiety,and learning and applying positive coping strategies to cope with anxiety, decrease in PTSD symptoms  Interventions: Solution Focused, Strength-based and Supportive  Summary: Paige Lozano is a 51 y.o. female who presents with relating that she fell, just came from the doctor, she was tearful and feels that has been one difficult thing after another to deal with. She has casts on two of her arms. Her right arm is fractured and her left hand needs surgery. She is frustrated about how they put one of the casts on her hand She had shots in knees and dealing with back pain. She shared verse from Bible that encourages a person not to dwell on negative thoughts as it causes a worse outcome. Patient relates that she applies the verse to herself to help her cope. She describes that it is difficult for her because she has no one here who can give her the support she needs. Taked over her plan to go home and be with dogs and relax. She wants to get going on regular routine of exercise. Patient reviewed session an related that it helped to get her feelings out because it helps her to catch her feelings before she sunk lower and lower. Suicidal/Homicidal: No  Therapist Response: Reviewed symptoms and therapist provided patient with ongoing emotional support and encouragement dealing with ongoing medical issues. Helped patient to identify and express feelings related to stressors to help her in coping with her feelings. Processed various strategies for dealing with stressors. Encouraged client to stay  focused on her own strengths and resources and resiliency. Plan: Return again in 2 weeks.2.3. Patient  continue to work on coping strategies to manage mood and stress and implement into life situations  Diagnosis: Axis I:   bipolar 2 disorder, most recent episode depressed, PTSD, generalized anxiety disorder    Axis II: No diagnosis    Sheffield Hawker A, LCSW 01/01/2017

## 2017-01-01 NOTE — Progress Notes (Signed)
  Subjective:    CC: Follow-up  HPI:  Right knee osteoarthritis: Doing extremely well, here for Orthovisc injection #4  Right wrist sprain: Continues to do better, x-rays were negative, continues with Velcro brace.  Left wrist pain: CT confirmed a fracture through the fusion at the proximal phalanx/first metacarpal. In addition the fusion screws at the first MCP appear to have loosened significantly. She does desire to switch to a different type of spica so that she can get it wet.    Past medical history:  Negative.  See flowsheet/record as well for more information.  Surgical history: Negative.  See flowsheet/record as well for more information.  Family history: Negative.  See flowsheet/record as well for more information.  Social history: Negative.  See flowsheet/record as well for more information.  Allergies, and medications have been entered into the medical record, reviewed, and no changes needed.   Review of Systems: No fevers, chills, night sweats, weight loss, chest pain, or shortness of breath.   Objective:    General: Well Developed, well nourished, and in no acute distress.  Neuro: Alert and oriented x3, extra-ocular muscles intact, sensation grossly intact.  HEENT: Normocephalic, atraumatic, pupils equal round reactive to light, neck supple, no masses, no lymphadenopathy, thyroid nonpalpable.  Skin: Warm and dry, no rashes. Cardiac: Regular rate and rhythm, no murmurs rubs or gallops, no lower extremity edema.  Respiratory: Clear to auscultation bilaterally. Not using accessory muscles, speaking in full sentences.  Procedure: Real-time Ultrasound Guided Injection of right knee Device: GE Logiq E  Verbal informed consent obtained.  Time-out conducted.  Noted no overlying erythema, induration, or other signs of local infection.  Skin prepped in a sterile fashion.  Local anesthesia: Topical Ethyl chloride.  With sterile technique and under real time ultrasound guidance:    30 mg/2 mL of OrthoVisc (sodium hyaluronate) in a prefilled syringe was injected easily into the suprapatellar recess from a lateral approach through a 22-gauge needle. Completed without difficulty  Pain immediately resolved suggesting accurate placement of the medication.  Advised to call if fevers/chills, erythema, induration, drainage, or persistent bleeding.  Images permanently stored and available for review in the ultrasound unit.  Impression: Technically successful ultrasound guided injection.  Short thumb spica Exos cast placed on the left  Impression and Recommendations:    Primary osteoarthritis of right knee Orthovisc injection #4 into the right knee, actually doing very well.  Right wrist injury Simple sprain, no fractures.  Hand injury, left, subsequent encounter There does appear to be a fracture through the proximal phalanx/first metacarpal fusion. There is also significant evidence of loosening of the CMC fusion screws. I would like Dr. Phillip Heal and to take a look at this. She does desire to get into the swimming pool so I am going to place her in a short spica Exos cast.

## 2017-01-01 NOTE — Assessment & Plan Note (Signed)
There does appear to be a fracture through the proximal phalanx/first metacarpal fusion. There is also significant evidence of loosening of the CMC fusion screws. I would like Dr. Phillip Heal and to take a look at this. She does desire to get into the swimming pool so I am going to place her in a short spica Exos cast.

## 2017-01-01 NOTE — Assessment & Plan Note (Signed)
Orthovisc injection #4 into the right knee, actually doing very well.

## 2017-01-01 NOTE — Assessment & Plan Note (Signed)
Simple sprain, no fractures.

## 2017-01-04 ENCOUNTER — Ambulatory Visit (INDEPENDENT_AMBULATORY_CARE_PROVIDER_SITE_OTHER): Payer: Medicare Other | Admitting: Sports Medicine

## 2017-01-04 ENCOUNTER — Encounter: Payer: Self-pay | Admitting: Sports Medicine

## 2017-01-04 DIAGNOSIS — S6992XD Unspecified injury of left wrist, hand and finger(s), subsequent encounter: Secondary | ICD-10-CM

## 2017-01-04 MED ORDER — HYDROCODONE-ACETAMINOPHEN 10-325 MG PO TABS: 0.5000 | ORAL_TABLET | Freq: Three times a day (TID) | ORAL | 0 refills | Status: DC | PRN

## 2017-01-04 NOTE — Progress Notes (Signed)
  Subjective:    CC: Follow-up  HPI: This is a pleasant 51 year old female, she returns, we placed her in a left short spica Exos cast at the last visit, she has loosening of her CMC fusion screws as well as a fracture through the proximal phalangeal/first metacarpal fusion noted on CT. Unfortunately her short Exos spica is not providing sufficient immobilization and seems to be rubbing into her thumb, she desires to transition back into a fiberglass splint.  She did have a right wrist sprain after a fall, this is overall doing well.  Past medical history:  Negative.  See flowsheet/record as well for more information.  Surgical history: Negative.  See flowsheet/record as well for more information.  Family history: Negative.  See flowsheet/record as well for more information.  Social history: Negative.  See flowsheet/record as well for more information.  Allergies, and medications have been entered into the medical record, reviewed, and no changes needed.   Review of Systems: No fevers, chills, night sweats, weight loss, chest pain, or shortness of breath.   Objective:    General: Well Developed, well nourished, and in no acute distress.  Neuro: Alert and oriented x3, extra-ocular muscles intact, sensation grossly intact.  HEENT: Normocephalic, atraumatic, pupils equal round reactive to light, neck supple, no masses, no lymphadenopathy, thyroid nonpalpable.  Skin: Warm and dry, no rashes. Cardiac: Regular rate and rhythm, no murmurs rubs or gallops, no lower extremity edema.  Respiratory: Clear to auscultation bilaterally. Not using accessory muscles, speaking in full sentences.  Hand inspected, no signs of skin breakdown, I applied a standard thumb spica fiberglass splint  Impression and Recommendations:    Hand injury, left, subsequent encounter Fracture through the proximal phalanx/first metacarpal fusion on CT. There is also significant loosening of the CMC fusion screws on CT  scan. Dr. Amedeo Plenty will also look at this but she does desire to get into Dr. Truman Hayward with [facility] first. She did have some difficulty in her short Exos spica cast, switched her back to a fiberglass thumb spica splint.

## 2017-01-04 NOTE — Assessment & Plan Note (Signed)
Fracture through the proximal phalanx/first metacarpal fusion on CT. There is also significant loosening of the CMC fusion screws on CT scan. Dr. Amedeo Plenty will also look at this but she does desire to get into Dr. Truman Hayward with [facility] first. She did have some difficulty in her short Exos spica cast, switched her back to a fiberglass thumb spica splint.

## 2017-01-05 ENCOUNTER — Encounter: Payer: Self-pay | Admitting: Rehabilitative and Restorative Service Providers"

## 2017-01-05 ENCOUNTER — Ambulatory Visit (INDEPENDENT_AMBULATORY_CARE_PROVIDER_SITE_OTHER): Payer: Medicare Other | Admitting: Rehabilitative and Restorative Service Providers"

## 2017-01-05 DIAGNOSIS — M25512 Pain in left shoulder: Secondary | ICD-10-CM

## 2017-01-05 DIAGNOSIS — M25612 Stiffness of left shoulder, not elsewhere classified: Secondary | ICD-10-CM | POA: Diagnosis present

## 2017-01-05 DIAGNOSIS — G8929 Other chronic pain: Secondary | ICD-10-CM

## 2017-01-05 DIAGNOSIS — R293 Abnormal posture: Secondary | ICD-10-CM | POA: Diagnosis not present

## 2017-01-05 DIAGNOSIS — M6281 Muscle weakness (generalized): Secondary | ICD-10-CM | POA: Diagnosis not present

## 2017-01-05 NOTE — Therapy (Signed)
[facility] [address] [place], [place], [address] Phone: [phone]   Fax:  [phone]  Physical Therapy Treatment  Patient Details  Name: Paige Lozano MRN: [id number] Date of Birth: Nov 04, 1966 Referring Provider: Dr Tania Ade  Encounter Date: 01/05/2017      PT End of Session - 01/05/17 1043    Visit Number 27   Number of Visits 36   Date for PT Re-Evaluation 01/29/17   Authorization Time Period G-code at visit 3   PT Start Time 1017   PT Stop Time 1100   PT Time Calculation (min) 43 min   Activity Tolerance Patient tolerated treatment well      Past Medical History:  Diagnosis Date  . Anxiety   . Bipolar 2 disorder, major depressive episode ([place])   . Complication of anesthesia   . COPD (chronic obstructive pulmonary disease) ([place])    smoker  . Fibromyalgia   . Insomnia   . Multiple sclerosis ([place])   . PONV (postoperative nausea and vomiting)   . Pulmonary embolism ([place]) 2000  . Sleep apnea    mild OSA no CPAP  . Ulcerative colitis [facility])     Past Surgical History:  Procedure Laterality Date  . FOOT SURGERY Left    bone spur  . HEMORRHOID SURGERY    . HUMERUS FRACTURE SURGERY Left   . MYOMECTOMY    . NASAL SINUS SURGERY    . SHOULDER ARTHROSCOPY WITH SUBACROMIAL DECOMPRESSION AND BICEP TENDON REPAIR Left 07/27/2016   Procedure: SHOULDER ARTHROSCOPY DEBRIDEMENT ROTATOR CUFF AND LABRUM, SUBACROMIAL DECOMPRESSION, BICEPS TENODESIS;  Surgeon: Tania Ade, MD;  Location: [facility];  Service: Orthopedics;  Laterality: Left;  SHOULDER ARTHROSCOPY DEBRIDEMENT ROTATOR CUFF AND LABRUM, SUBACROMIAL DECOMPRESSION, BICEPS TENOTOMY, POSSIBLE TENODESIS  . THUMB ARTHROSCOPY     5 surgeries on left thumb, 4 surgeries on right  . TONSILLECTOMY AND ADENOIDECTOMY      There were no vitals filed for this visit.      Subjective Assessment - 01/05/17 1050    Subjective Patient reports  that she tripped and fell 12/23/16 sustaining fracture and dislocating pins in Rt thumb as well as sprain to Lt wrist. Will require surgery Lt thumb and is awaiting MD appointment. Shoulder and knot are actually feeling better - less tight.    Currently in Pain? No/denies            [facility] PT Assessment - 01/05/17 0001      Assessment   Medical Diagnosis Lt shoulder scope, biceps tenotomy & open tenodesis   Referring Provider Dr Tania Ade   Onset Date/Surgical Date 07/27/16   Hand Dominance Right   Next MD Visit 3/18     AROM   Right/Left Shoulder --  supine - AROM WNL's all planes      Palpation   Palpation comment improving tenderness and muscular banding/knotted area Lt biceps prominent proximal to proximal incision                      [facility] Adult PT Treatment/Exercise - 01/05/17 0001      Shoulder Exercises: Stretch   Other Shoulder Stretches doorway stretch 3 positions 30 sec x 2 reps each      Moist Heat Therapy   Number Minutes Moist Heat 15 Minutes   Moist Heat Location Shoulder  Lt     Electrical Stimulation   Electrical Stimulation Location Lt shoulder   Electrical Stimulation Action IFC  Electrical Stimulation Parameters to tolerance   Electrical Stimulation Goals Pain;Tone     Ultrasound   Ultrasound Location Lt anterior shoudler at area of banding    Ultrasound Parameters 1.2 w/cm2; 1 mHz; 100%; 8 min    Ultrasound Goals Other (Comment)  tightness      Iontophoresis   Type of Iontophoresis Dexamethasone   Location Lt ant arm - deltoid/biceps area    Dose 120 mAmp   Time 8 hours      Manual Therapy   Manual therapy comments pt supine   Soft tissue mobilization Lt anterior arm/biceps - scar massage to proximal incision Lt anterior shoudler    Myofascial Release Lt bicep, long head and musculotendinous junction          Trigger Point Dry Needling - 01/05/17 1057    Consent Given? Yes   Muscles Treated Upper Body --  Lt  deltoid/biceps x3 - decreased tightness to palpation                    PT Long Term Goals - 01/05/17 1100      PT LONG TERM GOAL #1   Title I with advanced HEP to include water classes ( 01/29/17)    Time 12   Period Weeks   Status On-going     PT LONG TERM GOAL #3   Title increase strength Lt UE to 5-/5 to 5/5 throughout 01/29/17   Time 6   Period Weeks   Status On-going     PT LONG TERM GOAL #5   Title Walk and play with dogs without difficulty ( 01/2317)    Time 12   Period Weeks   Status On-going     PT LONG TERM GOAL #6   Title Decrease tightness and banding Lt anterior arm at deltoid/biceps thus decreasing pain and allowing pt to progress with funcitonal activity level 01/29/17   Time 6   Period Weeks   Status On-going               Plan - 01/05/17 1058    Clinical Impression Statement Patient presents with injury to Lt thumb - casted and will require surgery - as well as sprain to Lt wrist. She demonstrates decreased tightness to palpation and decreased banding Lt biceps/deltoid. Good improvement with tightness in Lt arm.    Rehab Potential Good   PT Frequency 2x / week   PT Duration 6 weeks   PT Treatment/Interventions Moist Heat;Ultrasound;Therapeutic exercise;Dry needling;Taping;Vasopneumatic Device;Manual techniques;Cryotherapy;Electrical Stimulation;Passive range of motion;Patient/family education   PT Next Visit Plan continue with  DN; modalities; ionto; manual work Psychologist, forensic per MD request    Consulted and Agree with Plan of Care Patient      Patient will benefit from skilled therapeutic intervention in order to improve the following deficits and impairments:  Postural dysfunction, Increased edema, Decreased strength, Pain, Impaired UE functional use, Increased muscle spasms  Visit Diagnosis: Stiffness of left shoulder, not elsewhere classified  Muscle weakness (generalized)  Abnormal posture  Chronic left shoulder  pain     Problem List Patient Active Problem List   Diagnosis Date Noted  . Hand injury, left, subsequent encounter 12/30/2016  . Hot flashes 12/29/2016  . Body aches 12/29/2016  . Right wrist injury 12/29/2016  . Itching 12/29/2016  . Sinusitis 11/30/2016  . History of pulmonary embolus (PE) 07/21/2016  . Right elbow pain 07/21/2016  . GAD (generalized anxiety disorder) 07/20/2016  . Primary osteoarthritis of right knee 06/01/2016  .  Anxiety state 06/01/2016  . Left shoulder pain 06/01/2016  . Multiple sclerosis ([place]) 05/29/2016  . Fibromyalgia 05/29/2016  . 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 Insomnia 05/29/2016  . Ulcerative pancolitis without complication ([place]) [id number]  . Bipolar 2 disorder ([place]) 05/29/2016  . Current smoker 05/29/2016    Cloteal Isaacson Nilda Simmer PT, MPH  01/05/2017, 11:02 AM  [facility] [place] [address] [place], [place], [address] Phone: [phone]   Fax:  [phone]  Name: Paige Lozano MRN: [id number] Date of Birth: 09/04/1966

## 2017-01-06 ENCOUNTER — Other Ambulatory Visit: Payer: Self-pay | Admitting: Physician Assistant

## 2017-01-07 ENCOUNTER — Ambulatory Visit (INDEPENDENT_AMBULATORY_CARE_PROVIDER_SITE_OTHER): Payer: Medicare Other | Admitting: Rehabilitative and Restorative Service Providers"

## 2017-01-07 ENCOUNTER — Encounter: Payer: Self-pay | Admitting: Rehabilitative and Restorative Service Providers"

## 2017-01-07 DIAGNOSIS — M6281 Muscle weakness (generalized): Secondary | ICD-10-CM

## 2017-01-07 DIAGNOSIS — M25612 Stiffness of left shoulder, not elsewhere classified: Secondary | ICD-10-CM

## 2017-01-07 DIAGNOSIS — M25512 Pain in left shoulder: Secondary | ICD-10-CM | POA: Diagnosis not present

## 2017-01-07 DIAGNOSIS — G8929 Other chronic pain: Secondary | ICD-10-CM | POA: Diagnosis not present

## 2017-01-07 DIAGNOSIS — R293 Abnormal posture: Secondary | ICD-10-CM | POA: Diagnosis not present

## 2017-01-07 NOTE — Therapy (Addendum)
[place], [place], [address] Phone: [phone]   Fax:  [phone]  Physical Therapy Treatment  Patient Details  Name: Paige Lozano MRN: [id number] Date of Birth: Feb 25, 1966 Referring Provider: Dr Tania Ade  Encounter Date: 01/07/2017      PT End of Session - 01/07/17 1021    Visit Number 28   Number of Visits 36   Date for PT Re-Evaluation 01/29/17   Authorization Time Period G-code at visit 66   PT Start Time 1021   PT Stop Time 1103   PT Time Calculation (min) 42 min   Activity Tolerance Patient tolerated treatment well      Past Medical History:  Diagnosis Date  . Anxiety   . Bipolar 2 disorder, major depressive episode ([place])   . Complication of anesthesia   . COPD (chronic obstructive pulmonary disease) ([place])    smoker  . Fibromyalgia   . Insomnia   . Multiple sclerosis ([place])   . PONV (postoperative nausea and vomiting)   . Pulmonary embolism ([place]) 2000  . Sleep apnea    mild OSA no CPAP  . Ulcerative colitis [facility])     Past Surgical History:  Procedure Laterality Date  . FOOT SURGERY Left    bone spur  . HEMORRHOID SURGERY    . HUMERUS FRACTURE SURGERY Left   . MYOMECTOMY    . NASAL SINUS SURGERY    . SHOULDER ARTHROSCOPY WITH SUBACROMIAL DECOMPRESSION AND BICEP TENDON REPAIR Left 07/27/2016   Procedure: SHOULDER ARTHROSCOPY DEBRIDEMENT ROTATOR CUFF AND LABRUM, SUBACROMIAL DECOMPRESSION, BICEPS TENODESIS;  Surgeon: Tania Ade, MD;  Location: [facility];  Service: Orthopedics;  Laterality: Left;  SHOULDER ARTHROSCOPY DEBRIDEMENT ROTATOR CUFF AND LABRUM, SUBACROMIAL DECOMPRESSION, BICEPS TENOTOMY, POSSIBLE TENODESIS  . THUMB ARTHROSCOPY     5 surgeries on left thumb, 4 surgeries on right  . TONSILLECTOMY AND ADENOIDECTOMY      There were no vitals filed for this visit.      Subjective Assessment - 01/07/17 1021    Subjective Patient reports  that her shoudler is OK. She didn't have much soreness following the DN. Can now sleep on the Lt side without awakening due to pain.    Currently in Pain? No/denies                         [facility] Adult PT Treatment/Exercise - 01/07/17 0001      Shoulder Exercises: Stretch   Other Shoulder Stretches passive stretch into shd extension and horizontal abd by PT 20-30 sec hold x 2 each    Other Shoulder Stretches doorway stretch 3 positions 30 sec x 2 reps each      Moist Heat Therapy   Number Minutes Moist Heat 15 Minutes   Moist Heat Location Shoulder  Lt     Electrical Stimulation   Electrical Stimulation Location Lt shoulder   Electrical Stimulation Action IFC   Electrical Stimulation Parameters to tolerance   Electrical Stimulation Goals Pain;Tone     Ultrasound   Ultrasound Location Lt anterior shoudler at area of tightness and banding    Ultrasound Parameters 1.2 w/cm2; 1 mHz; 100%; 8 min    Ultrasound Goals Other (Comment)  tightness      Iontophoresis   Type of Iontophoresis Dexamethasone   Location Lt ant arm - deltoid/biceps area    Dose 120 mAmp   Time 8 hours      Manual Therapy  Manual therapy comments pt supine   Soft tissue mobilization Lt anterior arm/biceps - scar massage to proximal incision Lt anterior shoudler    Myofascial Release Lt bicep, long head and musculotendinous junction                     PT Long Term Goals - 01/05/17 1100      PT LONG TERM GOAL #1   Title I with advanced HEP to include water classes ( 01/29/17)    Time 12   Period Weeks   Status On-going     PT LONG TERM GOAL #3   Title increase strength Lt UE to 5-/5 to 5/5 throughout 01/29/17   Time 6   Period Weeks   Status On-going     PT LONG TERM GOAL #5   Title Walk and play with dogs without difficulty ( 01/2317)    Time 12   Period Weeks   Status On-going     PT LONG TERM GOAL #6   Title Decrease tightness and banding Lt anterior arm at  deltoid/biceps thus decreasing pain and allowing pt to progress with funcitonal activity level 01/29/17   Time 6   Period Weeks   Status On-going               Plan - 01/07/17 1049    Clinical Impression Statement Shoulder and biceps/deltoid area continue to improve. Patient reports less pain and now is able to sleep on the Lt side without awakening dyue to pain. Area of banding and tightness is decreased. Patient will benefit from continued treatment including DN.    Rehab Potential Good   PT Frequency 2x / week   PT Duration 6 weeks   PT Treatment/Interventions Moist Heat;Ultrasound;Therapeutic exercise;Dry needling;Taping;Vasopneumatic Device;Manual techniques;Cryotherapy;Electrical Stimulation;Passive range of motion;Patient/family education   PT Next Visit Plan continue with  DN; modalities; ionto; manual work Psychologist, forensic per MD request    Consulted and Agree with Plan of Care Patient      Patient will benefit from skilled therapeutic intervention in order to improve the following deficits and impairments:  Postural dysfunction, Increased edema, Decreased strength, Pain, Impaired UE functional use, Increased muscle spasms  Visit Diagnosis: Stiffness of left shoulder, not elsewhere classified  Muscle weakness (generalized)  Abnormal posture  Chronic left shoulder pain     Problem List Patient Active Problem List   Diagnosis Date Noted  . Hand injury, left, subsequent encounter 12/30/2016  . Hot flashes 12/29/2016  . Body aches 12/29/2016  . Right wrist injury 12/29/2016  . Itching 12/29/2016  . Sinusitis 11/30/2016  . History of pulmonary embolus (PE) 07/21/2016  . Right elbow pain 07/21/2016  . GAD (generalized anxiety disorder) 07/20/2016  . Primary osteoarthritis of right knee 06/01/2016  . Anxiety state 06/01/2016  . Left shoulder pain 06/01/2016  . Multiple sclerosis ([place]) 05/29/2016  . Fibromyalgia 05/29/2016  . 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  Insomnia 05/29/2016  . Ulcerative pancolitis without complication ([place]) [id number]  . Bipolar 2 disorder ([place]) 05/29/2016  . Current smoker 05/29/2016    Celyn Nilda Simmer PT, MPH  01/07/2017, 11:01 AM  [facility] [place], [place], [address] Phone: [phone]   Fax:  [phone]  Name: Paige Lozano MRN: [id number] Date of Birth: [birth year]

## 2017-01-11 ENCOUNTER — Other Ambulatory Visit: Payer: Self-pay | Admitting: Sports Medicine

## 2017-01-11 ENCOUNTER — Telehealth: Payer: Self-pay | Admitting: *Deleted

## 2017-01-11 MED ORDER — HYDROCODONE-ACETAMINOPHEN 10-325 MG PO TABS: 0.5000 | ORAL_TABLET | Freq: Three times a day (TID) | ORAL | 0 refills | Status: DC | PRN

## 2017-01-11 NOTE — Telephone Encounter (Signed)
Patient notified

## 2017-01-11 NOTE — Telephone Encounter (Signed)
Patient is requesting a refill of hydrocodone. She states in the message that she will be taking her last dose today.

## 2017-01-11 NOTE — Telephone Encounter (Signed)
Rx in box.  Needs to try to decrease usage since surgery is a possibility

## 2017-01-13 ENCOUNTER — Encounter: Payer: Self-pay | Admitting: Rehabilitative and Restorative Service Providers"

## 2017-01-13 DIAGNOSIS — M1812 Unilateral primary osteoarthritis of first carpometacarpal joint, left hand: Secondary | ICD-10-CM | POA: Diagnosis not present

## 2017-01-13 DIAGNOSIS — S62235D Other nondisplaced fracture of base of first metacarpal bone, left hand, subsequent encounter for fracture with routine healing: Secondary | ICD-10-CM | POA: Insufficient documentation

## 2017-01-13 DIAGNOSIS — Z4789 Encounter for other orthopedic aftercare: Secondary | ICD-10-CM | POA: Diagnosis not present

## 2017-01-15 ENCOUNTER — Encounter: Payer: Self-pay | Admitting: Rehabilitative and Restorative Service Providers"

## 2017-01-15 ENCOUNTER — Ambulatory Visit (INDEPENDENT_AMBULATORY_CARE_PROVIDER_SITE_OTHER): Payer: Medicare Other | Admitting: Psychiatry

## 2017-01-15 ENCOUNTER — Ambulatory Visit (INDEPENDENT_AMBULATORY_CARE_PROVIDER_SITE_OTHER): Payer: Medicare Other | Admitting: Rehabilitative and Restorative Service Providers"

## 2017-01-15 DIAGNOSIS — F5102 Adjustment insomnia: Secondary | ICD-10-CM | POA: Diagnosis not present

## 2017-01-15 DIAGNOSIS — F3181 Bipolar II disorder: Secondary | ICD-10-CM | POA: Diagnosis not present

## 2017-01-15 DIAGNOSIS — Z888 Allergy status to other drugs, medicaments and biological substances status: Secondary | ICD-10-CM | POA: Diagnosis not present

## 2017-01-15 DIAGNOSIS — F411 Generalized anxiety disorder: Secondary | ICD-10-CM | POA: Diagnosis not present

## 2017-01-15 DIAGNOSIS — M25612 Stiffness of left shoulder, not elsewhere classified: Secondary | ICD-10-CM

## 2017-01-15 DIAGNOSIS — M25512 Pain in left shoulder: Secondary | ICD-10-CM

## 2017-01-15 DIAGNOSIS — G8929 Other chronic pain: Secondary | ICD-10-CM

## 2017-01-15 DIAGNOSIS — Z87891 Personal history of nicotine dependence: Secondary | ICD-10-CM | POA: Diagnosis not present

## 2017-01-15 DIAGNOSIS — M6281 Muscle weakness (generalized): Secondary | ICD-10-CM

## 2017-01-15 DIAGNOSIS — R293 Abnormal posture: Secondary | ICD-10-CM

## 2017-01-15 DIAGNOSIS — F4310 Post-traumatic stress disorder, unspecified: Secondary | ICD-10-CM

## 2017-01-15 DIAGNOSIS — Z79899 Other long term (current) drug therapy: Secondary | ICD-10-CM

## 2017-01-15 MED ORDER — OLANZAPINE 2.5 MG PO TABS: 2.5000 mg | ORAL_TABLET | Freq: Every day | ORAL | 2 refills | Status: DC

## 2017-01-15 NOTE — Progress Notes (Signed)
[facility] Outpatient Follow up visit   Patient Identification: Paige Lozano MRN:  [id number] Date of Evaluation:  01/15/2017 Referral Source: [facility] Primary care Chief Complaint:    Visit Diagnosis:    ICD-9-CM ICD-10-CM   1. Bipolar 2 disorder (HCC) 296.89 F31.81   2. PTSD (post-traumatic stress disorder) 309.81 F43.10   3. GAD (generalized anxiety disorder) 300.02 F41.1   4. Adjustment insomnia 307.41 F51.02     History of Present Illness:  51 years old Returns for follow-up and medication management  She suffers from pain, osteoarthritis, MS and recent wrist injury. It effects her tiredness.  Planning surgery for wrist.  nuvigil be started instead of ritalin Mood wise she feels Olanzapine at a low-dose is fine. She takes Ambien or trazodone at night for sleep. Anxiety fluctuates depending on the stress level and her limitation because of pain and arthritis  Modifying factor: goes to [facility]  She takes Xanax for when necessary anxiety and sleep difficulties    Aggravating factor: friend in [place] and has cancer. Patient suffering from multiple medical conditions including fibromyalgia, multiple sclerosis. Poor finances Modifying factors; her dog      Previous Psychotropic Medications: Yes   Substance Abuse History in the last 12 months:  Yes.   as per history Marijuana says it is medical for her condition.  Consequences of Substance Abuse: Medical Consequences:  fatigue, poor concenctration  Past Medical History:  Past Medical History:  Diagnosis Date  . Anxiety   . Bipolar 2 disorder, major depressive episode ([place])   . Complication of anesthesia   . COPD (chronic obstructive pulmonary disease) ([place])    smoker  . Fibromyalgia   . Insomnia   . Multiple sclerosis ([place])   . PONV (postoperative nausea and vomiting)   . Pulmonary embolism ([place]) 2000  . Sleep apnea    mild OSA no CPAP  . Ulcerative colitis [facility])     Past Surgical History:  Procedure Laterality Date  .  FOOT SURGERY Left    bone spur  . HEMORRHOID SURGERY    . HUMERUS FRACTURE SURGERY Left   . MYOMECTOMY    . NASAL SINUS SURGERY    . SHOULDER ARTHROSCOPY WITH SUBACROMIAL DECOMPRESSION AND BICEP TENDON REPAIR Left 07/27/2016   Procedure: SHOULDER ARTHROSCOPY DEBRIDEMENT ROTATOR CUFF AND LABRUM, SUBACROMIAL DECOMPRESSION, BICEPS TENODESIS;  Surgeon: Tania Ade, MD;  Location: [facility];  Service: Orthopedics;  Laterality: Left;  SHOULDER ARTHROSCOPY DEBRIDEMENT ROTATOR CUFF AND LABRUM, SUBACROMIAL DECOMPRESSION, BICEPS TENOTOMY, POSSIBLE TENODESIS  . THUMB ARTHROSCOPY     5 surgeries on left thumb, 4 surgeries on right  . TONSILLECTOMY AND ADENOIDECTOMY       Family History:  Family History  Problem Relation Age of Onset  . Adopted: Yes    Social History:   Social History   Social History  . Marital status: Divorced    Spouse name: N/A  . Number of children: N/A  . Years of education: N/A   Social History Main Topics  . Smoking status: Former Smoker    Quit date: 07/08/2016  . Smokeless tobacco: Never Used     Comment: currently taking Chantix  . Alcohol use 0.0 oz/week     Comment: rare   . Drug use: No     Comment: last use 3 month ago  . Sexual activity: Yes    Birth control/ protection: None   Other Topics Concern  . Not on file   Social History Narrative  . No narrative  on file     Allergies:   Allergies  Allergen Reactions  . Ketamine Anaphylaxis  . Oxycodone Diarrhea  . Papaya Enzyme Anaphylaxis    Metabolic Disorder Labs: No results found for: HGBA1C, MPG No results found for: PROLACTIN No results found for: CHOL, TRIG, HDL, CHOLHDL, VLDL, LDLCALC   Current Medications: Current Outpatient Prescriptions  Medication Sig Dispense Refill  . albuterol (PROAIR HFA) 108 (90 Base) MCG/ACT inhaler Inhale 2 puffs into the lungs every 6 (six) hours as needed.    . ALPRAZolam (XANAX) 0.5 MG tablet take 1 tablet by mouth twice a day if  needed for anxiety 45 tablet 0  . balsalazide (COLAZAL) 750 MG capsule Take 2,250 mg by mouth 3 (three) times daily.    . Diclofenac Sodium (PENNSAID) 2 % SOLN 2 pumps twice a day application to painful area. 2 Bottle 1  . Fingolimod HCl (GILENYA) 0.5 MG CAPS Take 0.5 capsules by mouth daily.    . fluticasone (FLONASE) 50 MCG/ACT nasal spray Place 2 sprays into both nostrils daily. 16 g 3  . HYDROcodone-acetaminophen (NORCO) 10-325 MG tablet Take 0.5-2 tablets by mouth every 8 (eight) hours as needed. 30 tablet 0  . hydrOXYzine (ATARAX/VISTARIL) 25 MG tablet Take 1 tablet (25 mg total) by mouth 3 (three) times daily as needed for itching. 60 tablet 0  . meloxicam (MOBIC) 15 MG tablet Take 1 tablet (15 mg total) by mouth daily. 30 tablet 2  . methylphenidate (RITALIN) 10 MG tablet Take 10 mg by mouth 2 (two) times daily.    . mupirocin ointment (BACTROBAN) 2 % Apply topically 3 (three) times daily. 30 g 3  . OLANZapine (ZYPREXA) 2.5 MG tablet Take 1 tablet (2.5 mg total) by mouth daily. 30 tablet 2  . traZODone (DESYREL) 50 MG tablet take 1 tablet by mouth at bedtime if needed 30 tablet 1  . varenicline (CHANTIX CONTINUING MONTH PAK) 1 MG tablet Take 1 tablet (1 mg total) by mouth 2 (two) times daily. 60 tablet 5  . zolpidem (AMBIEN) 10 MG tablet Take 1 tablet (10 mg total) by mouth at bedtime as needed for sleep. 30 tablet 1   No current facility-administered medications for this visit.       Psychiatric Specialty Exam: Review of Systems  Cardiovascular: Negative for chest pain.  Musculoskeletal: Positive for joint pain and myalgias.  Neurological: Negative for tremors and headaches.  Psychiatric/Behavioral: Negative for suicidal ideas.    There were no vitals taken for this visit.There is no height or weight on file to calculate BMI.  General Appearance: Casual  Eye Contact:  Fair  Speech:  Normal Rate  Volume:  Increased  Mood: stressed due to wrist injury  Affect:  Congruent and  Full Range  Thought Process:  Goal Directed  Orientation:  Full (Time, Place, and Person)  Thought Content:  Logical  Suicidal Thoughts:  No  Homicidal Thoughts:  No  Memory:  Immediate;   Fair Recent;   Fair  Judgement:  Fair  Insight:  Fair  Psychomotor Activity:  Normal  Concentration:  Concentration: Fair and Attention Span: Fair  Recall:  [employer] of Knowledge:Fair  Language: Fair  Akathisia:  Negative  Handed:  Right  AIMS (if indicated):    Assets:  Desire for Improvement  ADL's:  Intact  Cognition: WNL  Sleep:  Fair while on meds    Treatment Plan Summary: Medication management and Plan as follows  Bipolar 2: baseline. Continue olanzapine 2.5mg  does  not want to increase it. GAD: fluctuates. On xanax prn Insomnia: reviewed sleep hygiene. Takes ambien prn Or trazadone More concern with the fatigue related to MS and is planning to start provigil.  Review side effects Cortisporin therapy continue therapy with her counselor as well follow-up in    Merian Capron, MD 3/9/201810:35 AM

## 2017-01-15 NOTE — Therapy (Signed)
[place], [place], [address] Phone: [phone]   Fax:  [phone]  Physical Therapy Treatment  Patient Details  Name: Paige Lozano MRN: [id number] Date of Birth: 1966-09-21 Referring Provider: Dr Tania Ade  Encounter Date: 01/15/2017      PT End of Session - 01/15/17 1113    Visit Number 29   Number of Visits 36   Date for PT Re-Evaluation 01/29/17   Authorization Time Period G-code at visit 30   PT Start Time 1104   PT Stop Time 1159   PT Time Calculation (min) 55 min   Activity Tolerance Patient tolerated treatment well      Past Medical History:  Diagnosis Date  . Anxiety   . Bipolar 2 disorder, major depressive episode ([place])   . Complication of anesthesia   . COPD (chronic obstructive pulmonary disease) ([place])    smoker  . Fibromyalgia   . Insomnia   . Multiple sclerosis ([place])   . PONV (postoperative nausea and vomiting)   . Pulmonary embolism ([place]) 2000  . Sleep apnea    mild OSA no CPAP  . Ulcerative colitis [facility])     Past Surgical History:  Procedure Laterality Date  . FOOT SURGERY Left    bone spur  . HEMORRHOID SURGERY    . HUMERUS FRACTURE SURGERY Left   . MYOMECTOMY    . NASAL SINUS SURGERY    . SHOULDER ARTHROSCOPY WITH SUBACROMIAL DECOMPRESSION AND BICEP TENDON REPAIR Left 07/27/2016   Procedure: SHOULDER ARTHROSCOPY DEBRIDEMENT ROTATOR CUFF AND LABRUM, SUBACROMIAL DECOMPRESSION, BICEPS TENODESIS;  Surgeon: Tania Ade, MD;  Location: [facility];  Service: Orthopedics;  Laterality: Left;  SHOULDER ARTHROSCOPY DEBRIDEMENT ROTATOR CUFF AND LABRUM, SUBACROMIAL DECOMPRESSION, BICEPS TENOTOMY, POSSIBLE TENODESIS  . THUMB ARTHROSCOPY     5 surgeries on left thumb, 4 surgeries on right  . TONSILLECTOMY AND ADENOIDECTOMY      There were no vitals filed for this visit.      Subjective Assessment - 01/15/17 1114    Subjective Patient reports  that she was working in the pool - walking with Lt UE above head. She has some soreness in the arm but feels that it has helped with strength. Saw orthopedist for Lt thumb/hand injury. she is now casted and will have surgery when the fracture is healed - not sure the date.      Currently in Pain? Yes   Pain Score 2    Pain Location Shoulder   Pain Orientation Left;Anterior   Pain Descriptors / Indicators Sore;Tightness   Pain Type Chronic pain   Pain Onset More than a month ago   Pain Frequency Intermittent                         [facility] Adult PT Treatment/Exercise - 01/15/17 0001      Moist Heat Therapy   Number Minutes Moist Heat 20 Minutes   Moist Heat Location Shoulder  Lt     Electrical Stimulation   Electrical Stimulation Location Lt shoulder   Electrical Stimulation Action IFC   Electrical Stimulation Parameters to tolerance   Electrical Stimulation Goals Pain;Tone     Ultrasound   Ultrasound Location Lt anterior shoulder/biceps area of tightness and banding    Ultrasound Parameters 1.2 w/cm2; 1 mHz; 100%; 8 min    Ultrasound Goals Other (Comment)  tightness      Iontophoresis   Type of Iontophoresis Dexamethasone  Location Lt ant arm - deltoid/biceps area    Dose 120 mAmp   Time 8 hours      Manual Therapy   Manual therapy comments pt supine   Soft tissue mobilization Lt anterior arm/biceps - scar massage to proximal incision Lt anterior shoudler    Myofascial Release Lt bicep, long head and musculotendinous junction                     PT Long Term Goals - 01/05/17 1100      PT LONG TERM GOAL #1   Title I with advanced HEP to include water classes ( 01/29/17)    Time 12   Period Weeks   Status On-going     PT LONG TERM GOAL #3   Title increase strength Lt UE to 5-/5 to 5/5 throughout 01/29/17   Time 6   Period Weeks   Status On-going     PT LONG TERM GOAL #5   Title Walk and play with dogs without difficulty ( 01/2317)     Time 12   Period Weeks   Status On-going     PT LONG TERM GOAL #6   Title Decrease tightness and banding Lt anterior arm at deltoid/biceps thus decreasing pain and allowing pt to progress with funcitonal activity level 01/29/17   Time 6   Period Weeks   Status On-going               Plan - 01/15/17 1153    Clinical Impression Statement Some flare up of symptoms related to holding her arm up in the air while in the pool. Some increased palpable tightness through the anterior shoudler/deltoid/biceps area. Good response to modalities and manual work today.    PT Frequency 2x / week   PT Duration 6 weeks   PT Treatment/Interventions Moist Heat;Ultrasound;Therapeutic exercise;Dry needling;Taping;Vasopneumatic Device;Manual techniques;Cryotherapy;Electrical Stimulation;Passive range of motion;Patient/family education   PT Next Visit Plan continue with  DN; modalities; ionto; manual work Psychologist, forensic per MD request    Consulted and Agree with Plan of Care Patient      Patient will benefit from skilled therapeutic intervention in order to improve the following deficits and impairments:  Postural dysfunction, Increased edema, Decreased strength, Pain, Impaired UE functional use, Increased muscle spasms  Visit Diagnosis: Stiffness of left shoulder, not elsewhere classified  Muscle weakness (generalized)  Abnormal posture  Chronic left shoulder pain  Acute pain of left shoulder     Problem List Patient Active Problem List   Diagnosis Date Noted  . Hand injury, left, subsequent encounter 12/30/2016  . Hot flashes 12/29/2016  . Body aches 12/29/2016  . Right wrist injury 12/29/2016  . Itching 12/29/2016  . Sinusitis 11/30/2016  . History of pulmonary embolus (PE) 07/21/2016  . Right elbow pain 07/21/2016  . GAD (generalized anxiety disorder) 07/20/2016  . Primary osteoarthritis of right knee 06/01/2016  . Anxiety state 06/01/2016  . Left shoulder pain 06/01/2016  .  Multiple sclerosis ([place]) 05/29/2016  . Fibromyalgia 05/29/2016  . 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 Insomnia 05/29/2016  . Ulcerative pancolitis without complication ([place]) 23/76/2831  . Bipolar 2 disorder ([place]) 05/29/2016  . Current smoker 05/29/2016    Kalayna Noy Nilda Simmer PT, MPH 01/15/2017, 12:00 PM  [facility] [place], [place], [address] Phone: [phone]   Fax:  [phone]  Name: Mariane Burpee MRN: [id number] Date of Birth: 1966/01/05

## 2017-01-18 ENCOUNTER — Other Ambulatory Visit: Payer: Self-pay | Admitting: Sports Medicine

## 2017-01-20 ENCOUNTER — Ambulatory Visit (INDEPENDENT_AMBULATORY_CARE_PROVIDER_SITE_OTHER): Payer: Medicare Other | Admitting: Rehabilitative and Restorative Service Providers"

## 2017-01-20 ENCOUNTER — Telehealth: Payer: Self-pay

## 2017-01-20 ENCOUNTER — Encounter: Payer: Self-pay | Admitting: Rehabilitative and Restorative Service Providers"

## 2017-01-20 DIAGNOSIS — R293 Abnormal posture: Secondary | ICD-10-CM

## 2017-01-20 DIAGNOSIS — G8929 Other chronic pain: Secondary | ICD-10-CM | POA: Diagnosis not present

## 2017-01-20 DIAGNOSIS — M25512 Pain in left shoulder: Secondary | ICD-10-CM | POA: Diagnosis not present

## 2017-01-20 DIAGNOSIS — M6281 Muscle weakness (generalized): Secondary | ICD-10-CM | POA: Diagnosis not present

## 2017-01-20 DIAGNOSIS — M25612 Stiffness of left shoulder, not elsewhere classified: Secondary | ICD-10-CM | POA: Diagnosis present

## 2017-01-20 NOTE — Telephone Encounter (Signed)
Pt called for pain medication refill.  Dr T off this afternoon.  Per Leta, LPN, she would need to follow up with surgeon.

## 2017-01-20 NOTE — Therapy (Addendum)
[place], [place], [address] Phone: [phone]   Fax:  [phone]  Physical Therapy Treatment  Patient Details  Name: Paige Lozano MRN: [id number] Date of Birth: 1966-03-25 Referring Provider: Dr Tania Ade  Encounter Date: 01/20/2017      PT End of Session - 01/20/17 1548    Visit Number 30   Number of Visits 36   Date for PT Re-Evaluation 01/29/17   Authorization Time Period G-code at visit 16   PT Start Time 1403   PT Stop Time 1449   PT Time Calculation (min) 46 min   Activity Tolerance Patient tolerated treatment well      Past Medical History:  Diagnosis Date  . Anxiety   . Bipolar 2 disorder, major depressive episode ([place])   . Complication of anesthesia   . COPD (chronic obstructive pulmonary disease) ([place])    smoker  . Fibromyalgia   . Insomnia   . Multiple sclerosis ([place])   . PONV (postoperative nausea and vomiting)   . Pulmonary embolism ([place]) 2000  . Sleep apnea    mild OSA no CPAP  . Ulcerative colitis [facility])     Past Surgical History:  Procedure Laterality Date  . FOOT SURGERY Left    bone spur  . HEMORRHOID SURGERY    . HUMERUS FRACTURE SURGERY Left   . MYOMECTOMY    . NASAL SINUS SURGERY    . SHOULDER ARTHROSCOPY WITH SUBACROMIAL DECOMPRESSION AND BICEP TENDON REPAIR Left 07/27/2016   Procedure: SHOULDER ARTHROSCOPY DEBRIDEMENT ROTATOR CUFF AND LABRUM, SUBACROMIAL DECOMPRESSION, BICEPS TENODESIS;  Surgeon: Tania Ade, MD;  Location: [facility];  Service: Orthopedics;  Laterality: Left;  SHOULDER ARTHROSCOPY DEBRIDEMENT ROTATOR CUFF AND LABRUM, SUBACROMIAL DECOMPRESSION, BICEPS TENOTOMY, POSSIBLE TENODESIS  . THUMB ARTHROSCOPY     5 surgeries on left thumb, 4 surgeries on right  . TONSILLECTOMY AND ADENOIDECTOMY      There were no vitals filed for this visit.     Moist Heat Therapy     Number Minutes Moist Heat  20 Minutes              Moist Heat Location  Shoulder               Lt                            Electrical Stimulation     Electrical Stimulation Location  Lt shoulder             Electrical Stimulation Action  IFC             Electrical Stimulation Parameters  to tolerance             Electrical Stimulation Goals  Pain;Tone             Ultrasound     Ultrasound Location  Lt anterior shoudler              Ultrasound Parameters  1.2 w/cm2; 100- %; 1 mHz; 8 min             Ultrasound Goals  Other (Comment)               tightness                             Iontophoresis     Type of Iontophoresis  Dexamethasone  Location  Lt ant arm - deltoid/biceps area              Dose  120 mAmp             Time  8 hours              Manual Therapy     Manual therapy comments  pt supine             Soft tissue mobilization  Lt anterior arm/biceps - scar massage to proximal inci- sion Lt anteri- or shoudler              Myofascial Release  Lt bicep, long head and muscu- lotendinous junction                                               PT Long Term Goals - 01/20/17 1551      PT LONG TERM GOAL #1   Title I with advanced HEP to include water classes ( 01/29/17)    Time 12   Period Weeks   Status Partially Met     PT LONG TERM GOAL #2   Title demo Lt shoulder ROM WFL ( 12/30/15)    Time 6   Period Weeks   Status Achieved     PT LONG TERM GOAL #3   Title increase strength Lt UE to 5-/5 to 5/5 throughout 01/29/17   Time 6   Period Weeks   Status On-going     PT LONG TERM GOAL #4   Title improve FOTO =/< 44% limited ( 12/30/15)    Time 6   Period Weeks   Status Achieved     PT LONG TERM GOAL #5   Title Walk and play with dogs without difficulty ( 01/2317)    Time 12   Period Weeks   Status On-going     PT LONG TERM GOAL #6   Title Decrease tightness and banding Lt anterior arm at deltoid/biceps thus decreasing pain and allowing pt to progress with  funcitonal activity level 01/29/17   Time 6   Period Weeks               Plan - 01/20/17 1549    Clinical Impression Statement Continued intermittent pain in the anterior Lt arm distal to [facility] joint and through the area of muscular banding. Patient's rehab is complicated by recent fracture Lt thumb wihich is awaiting surgery. Awaiting orders from shoulder surgeon Dr [name] to progress with shoulder rehab. patient continues to respond well to manual work; DN; modalities.    Rehab Potential Good   PT Frequency 2x / week   PT Duration 6 weeks   PT Treatment/Interventions Moist Heat;Ultrasound;Therapeutic exercise;Dry needling;Taping;Vasopneumatic Device;Manual techniques;Cryotherapy;Electrical Stimulation;Passive range of motion;Patient/family education   PT Next Visit Plan continue with  DN; modalities; ionto; manual work Psychologist, forensic per MD request    Consulted and Agree with Plan of Care Patient      Patient will benefit from skilled therapeutic intervention in order to improve the following deficits and impairments:  Postural dysfunction, Increased edema, Decreased strength, Pain, Impaired UE functional use, Increased muscle spasms  Visit Diagnosis: Stiffness of left shoulder, not elsewhere classified  Muscle weakness (generalized)  Abnormal posture  Chronic left shoulder pain     Problem List Patient Active Problem List   Diagnosis Date Noted  .  Hand injury, left, subsequent encounter 12/30/2016  . Hot flashes 12/29/2016  . Body aches 12/29/2016  . Right wrist injury 12/29/2016  . Itching 12/29/2016  . Sinusitis 11/30/2016  . History of pulmonary embolus (PE) 07/21/2016  . Right elbow pain 07/21/2016  . GAD (generalized anxiety disorder) 07/20/2016  . Primary osteoarthritis of right knee 06/01/2016  . Anxiety state 06/01/2016  . Left shoulder pain 06/01/2016  . Multiple sclerosis ([place]) 05/29/2016  . Fibromyalgia 05/29/2016  . Osteoarthritis of thumb 05/29/2016   . Insomnia 05/29/2016  . Ulcerative pancolitis without complication ([place]) 70/17/7939  . Bipolar 2 disorder ([place]) 05/29/2016  . Current smoker 05/29/2016    Akiel Fennell Nilda Simmer PT, MPH  01/20/2017, 3:54 PM  [facility] [place], [place], [address] Phone: [phone]   Fax:  [phone]  Name: Paige Lozano MRN: [id number] Date of Birth: [birth year]

## 2017-01-22 ENCOUNTER — Encounter: Payer: Self-pay | Admitting: Physical Therapy

## 2017-01-22 ENCOUNTER — Ambulatory Visit (HOSPITAL_COMMUNITY): Payer: Self-pay | Admitting: Licensed Clinical Social Worker

## 2017-01-25 NOTE — Telephone Encounter (Signed)
error 

## 2017-01-26 ENCOUNTER — Ambulatory Visit (INDEPENDENT_AMBULATORY_CARE_PROVIDER_SITE_OTHER): Payer: Medicare Other | Admitting: Physical Therapy

## 2017-01-26 DIAGNOSIS — R293 Abnormal posture: Secondary | ICD-10-CM | POA: Diagnosis not present

## 2017-01-26 DIAGNOSIS — M25512 Pain in left shoulder: Secondary | ICD-10-CM

## 2017-01-26 DIAGNOSIS — M6281 Muscle weakness (generalized): Secondary | ICD-10-CM | POA: Diagnosis not present

## 2017-01-26 DIAGNOSIS — M25612 Stiffness of left shoulder, not elsewhere classified: Secondary | ICD-10-CM | POA: Diagnosis present

## 2017-01-26 DIAGNOSIS — G8929 Other chronic pain: Secondary | ICD-10-CM | POA: Diagnosis not present
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
## 2017-01-26 NOTE — Patient Instructions (Signed)
Scapular Retraction: Rowing (Eccentric) - Arms - Side (Resistance Band)    WRAP BAND AROUND ARM JUST ABOVE ELBOW. Pull back left elbow and squeeze shoulder blade. Keep elbows by sides, thumbs up. Hold for 3-5 seconds. Use __yellow__ resistance band. _10-15_ reps per set, _2__ sets per day, _7__ days per week.   http://ecce.exer.us/227   Copyright  VHI. All rights reserved.    Strengthening: Resisted Flexion    Hold tubing around left arm at elbow.  Punch Left arm forward. Move shoulder through pain-free range of motion. Repeat __10-15__ times per set. Do _1-2___ sets per session. Do __2__ sessions per day.  http://orth.exer.us/825   Copyright  VHI. All rights reserved.    Strengthening: Resisted Abduction    Hold tubing with left arm across body and looped just above cast. Pull up and away from side. Move through pain-free range of motion. Repeat _10-15___ times per set. Do _1-2___ sets per session. Do _2___ sessions per day.  http://orth.exer.us/827   Copyright  VHI. All rights reserved.    Strengthening: Resisted External Rotation    Hold tubing wrapped around wrist of left hand, elbow at side and forearm across body. Rotate forearm out. Repeat __10-15__ times per set. Do __1-2__ sets per session. Do __2__ sessions per day.  http://orth.exer.us/829   Copyright  VHI. All rights reserved.    Strengthening: Resisted Internal Rotation    Hold tubing around cast at wrist of left hand, elbow at side and forearm out. Rotate forearm in across body. Repeat __10-15__ times per set. Do __1-2__ sets per session. Do _2___ sessions per day.  http://orth.exer.us/831   Copyright  VHI. All rights reserved.     Scapular: Retraction (Prone)    Holding __2__ pound weights, left arm hanging down off bench. Pull elbow back, pinching shoulder blades together. Repeat __15__ times per set. Do _1___ sets per session. Do __1-2__ sessions per  day.  http://orth.exer.us/863   Copyright  VHI. All rights reserved.    Extension - Prone ([place]umb[place])    Lie with left arm hanging off side of bed. Lift hand back and up. Repeat _15___ times per set. Do __1__ sets per session. Do _6-7___ sessions per week. Use _2___ lb weight.   Copyright  VHI. All rights reserved.   Abduction: Horizontal - Prone ([place]umb[place])    Lie with left arm hanging down. Lift arm out to side, thumb up. Repeat _15___ times per set. Do __1__ sets per session. Do __6-7__ sessions per week. Use __0__ lb weight.   Copyright  VHI. All rights reserved.

## 2017-01-28 DIAGNOSIS — R5383 Other fatigue: Secondary | ICD-10-CM | POA: Diagnosis not present

## 2017-01-28 DIAGNOSIS — G603 Idiopathic progressive neuropathy: Secondary | ICD-10-CM | POA: Diagnosis not present

## 2017-01-28 DIAGNOSIS — F319 Bipolar disorder, unspecified: Secondary | ICD-10-CM | POA: Diagnosis not present

## 2017-01-28 DIAGNOSIS — D649 Anemia, unspecified: Secondary | ICD-10-CM | POA: Diagnosis not present

## 2017-01-28 DIAGNOSIS — G35 Multiple sclerosis: Secondary | ICD-10-CM | POA: Diagnosis not present

## 2017-01-28 DIAGNOSIS — I10 Essential (primary) hypertension: Secondary | ICD-10-CM | POA: Diagnosis not present

## 2017-01-29 ENCOUNTER — Encounter: Payer: Self-pay | Admitting: Rehabilitative and Restorative Service Providers"

## 2017-01-29 ENCOUNTER — Ambulatory Visit (INDEPENDENT_AMBULATORY_CARE_PROVIDER_SITE_OTHER): Payer: Medicare Other | Admitting: Rehabilitative and Restorative Service Providers"

## 2017-01-29 ENCOUNTER — Ambulatory Visit (INDEPENDENT_AMBULATORY_CARE_PROVIDER_SITE_OTHER): Payer: Medicare Other | Admitting: Licensed Clinical Social Worker

## 2017-01-29 DIAGNOSIS — G8929 Other chronic pain: Secondary | ICD-10-CM | POA: Diagnosis not present

## 2017-01-29 DIAGNOSIS — F4310 Post-traumatic stress disorder, unspecified: Secondary | ICD-10-CM | POA: Diagnosis not present

## 2017-01-29 DIAGNOSIS — F411 Generalized anxiety disorder: Secondary | ICD-10-CM

## 2017-01-29 DIAGNOSIS — M25512 Pain in left shoulder: Secondary | ICD-10-CM | POA: Diagnosis not present

## 2017-01-29 DIAGNOSIS — M6281 Muscle weakness (generalized): Secondary | ICD-10-CM

## 2017-01-29 DIAGNOSIS — M25612 Stiffness of left shoulder, not elsewhere classified: Secondary | ICD-10-CM

## 2017-01-29 DIAGNOSIS — R293 Abnormal posture: Secondary | ICD-10-CM | POA: Diagnosis not present

## 2017-01-29 DIAGNOSIS — F3181 Bipolar II disorder: Secondary | ICD-10-CM

## 2017-01-29 NOTE — Therapy (Signed)
[place], [place], [address] Phone: [phone]   Fax:  [phone]  Physical Therapy Treatment  Patient Details  Name: Paige Lozano MRN: [id number] Date of Birth: 1965/11/23 Referring Provider: Dr Tania Ade  Encounter Date: 01/29/2017      PT End of Session - 01/29/17 0938    Visit Number 32   Number of Visits 44   Date for PT Re-Evaluation 03/12/17   Authorization Time Period G-code at visit 81   PT Start Time 0937  pt 7 min late or appt    PT Stop Time 1030   PT Time Calculation (min) 53 min   Activity Tolerance Patient tolerated treatment well      Past Medical History:  Diagnosis Date  . Anxiety   . Bipolar 2 disorder, major depressive episode ([place])   . Complication of anesthesia   . COPD (chronic obstructive pulmonary disease) ([place])    smoker  . Fibromyalgia   . Insomnia   . Multiple sclerosis ([place])   . PONV (postoperative nausea and vomiting)   . Pulmonary embolism ([place]) 2000  . Sleep apnea    mild OSA no CPAP  . Ulcerative colitis [facility])     Past Surgical History:  Procedure Laterality Date  . FOOT SURGERY Left    bone spur  . HEMORRHOID SURGERY    . HUMERUS FRACTURE SURGERY Left   . MYOMECTOMY    . NASAL SINUS SURGERY    . SHOULDER ARTHROSCOPY WITH SUBACROMIAL DECOMPRESSION AND BICEP TENDON REPAIR Left 07/27/2016   Procedure: SHOULDER ARTHROSCOPY DEBRIDEMENT ROTATOR CUFF AND LABRUM, SUBACROMIAL DECOMPRESSION, BICEPS TENODESIS;  Surgeon: Tania Ade, MD;  Location: [facility];  Service: Orthopedics;  Laterality: Left;  SHOULDER ARTHROSCOPY DEBRIDEMENT ROTATOR CUFF AND LABRUM, SUBACROMIAL DECOMPRESSION, BICEPS TENOTOMY, POSSIBLE TENODESIS  . THUMB ARTHROSCOPY     5 surgeries on left thumb, 4 surgeries on right  . TONSILLECTOMY AND ADENOIDECTOMY      There were no vitals filed for this visit.      Subjective Assessment - 01/29/17 0943    Subjective Patient reports that she did well with the exercise at last visit. She is working out with a trainier and working in the pool as well adding some shoudler exercises.    Currently in Pain? No/denies   Pain Location Shoulder   Pain Orientation Left;Anterior   Pain Descriptors / Indicators Tightness;Sore   Pain Type Chronic pain   Pain Onset More than a month ago   Pain Frequency Intermittent                         [facility] Adult PT Treatment/Exercise - 01/29/17 0001      Shoulder Exercises: Therapy Ball   ABduction 20 reps  isometric into ball    Other Therapy Ball Exercises ball on wall at chest height and overhead working on various movements including flex/ext; horizontal ab/add; circles - CW/CCW 3-4 min    Other Therapy Ball Exercises ER into ball isometric x 20      Moist Heat Therapy   Number Minutes Moist Heat 15 Minutes   Moist Heat Location Shoulder  Lt     Electrical Stimulation   Electrical Stimulation Location Lt shoulder   Electrical Stimulation Action IFC  e-stim through dry needles x 5 min ant shoulder    Electrical Stimulation Parameters to tolerance   Electrical Stimulation Goals Pain;Tone     Manual Therapy  Manual therapy comments pt supine   Soft tissue mobilization Lt anterior arm/biceps - scar massage to proximal incision Lt anterior shoudler    Myofascial Release Lt bicep, long head and musculotendinous junction          Trigger Point Dry Needling - 01/29/17 1006    Consent Given? Yes   Muscles Treated Upper Body --  ant deltoid/biceps x 3 w/ estim-palpable decreased tightness                   PT Long Term Goals - 01/29/17 0940      PT LONG TERM GOAL #1   Title I with advanced HEP to include water classes ( 03/12/17)    Time 18   Period Weeks   Status Revised     PT LONG TERM GOAL #2   Title Patient reports that she can lift Lt UE overhead without pain or discomfort to improve functional Lt UE activities  03/12/17   Time 18   Period Weeks   Status New     PT LONG TERM GOAL #3   Title increase strength Lt UE to 5-/5 to 5/5 throughout 03/12/17   Time 18   Period Weeks   Status Revised     PT LONG TERM GOAL #4   Title improve FOTO =/< 38% limited 03/12/17   Time 18   Period Weeks   Status New     PT LONG TERM GOAL #5   Title Walk and play with dogs without difficulty ( 03/12/17)    Time 18   Period Weeks   Status Revised     PT LONG TERM GOAL #6   Title Decrease tightness and banding Lt anterior arm at deltoid/biceps thus decreasing pain and allowing pt to progress with funcitonal activity level 03/12/17   Time 18   Period Weeks   Status Revised               Plan - 01/29/17 0947    Clinical Impression Statement Tolerated iniitation of strengthening exercises with minimal pain and soreness. Patient also added exercise with trainer at gym. Responded well to DN and manual work with stretching through area of tightness anterior shoudler/arm in pathologically tight deltoid and biceps tissue.  Progressing gradually with strengthening.    Rehab Potential Good   PT Frequency 2x / week   PT Duration 6 weeks   PT Treatment/Interventions Moist Heat;Ultrasound;Therapeutic exercise;Dry needling;Taping;Vasopneumatic Device;Manual techniques;Cryotherapy;Electrical Stimulation;Passive range of motion;Patient/family education   PT Next Visit Plan continue with  DN; modalities; ionto; manual work Psychologist, forensic per MD request, progress strengthening-give aquatic HEP    Consulted and Agree with Plan of Care Patient      Patient will benefit from skilled therapeutic intervention in order to improve the following deficits and impairments:  Postural dysfunction, Increased edema, Decreased strength, Pain, Impaired UE functional use, Increased muscle spasms  Visit Diagnosis: Stiffness of left shoulder, not elsewhere classified  Muscle weakness (generalized)  Abnormal posture  Chronic left shoulder  pain  Acute pain of left shoulder     Problem List Patient Active Problem List   Diagnosis Date Noted  . Hand injury, left, subsequent encounter 12/30/2016  . Hot flashes 12/29/2016  . Body aches 12/29/2016  . Right wrist injury 12/29/2016  . Itching 12/29/2016  . Sinusitis 11/30/2016  . History of pulmonary embolus (PE) 07/21/2016  . Right elbow pain 07/21/2016  . GAD (generalized anxiety disorder) 07/20/2016  . Primary osteoarthritis of right knee 06/01/2016  .  Anxiety state 06/01/2016  . Left shoulder pain 06/01/2016  . Multiple sclerosis ([place]) 05/29/2016  . Fibromyalgia 05/29/2016  . 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 Insomnia 05/29/2016  . Ulcerative pancolitis without complication ([place]) 53/66/4403  . Bipolar 2 disorder ([place]) 05/29/2016  . Current smoker 05/29/2016    Jaxzen Vanhorn Nilda Simmer PT, MPH  01/29/2017, 10:14 AM  [facility] [place], [place], [address] Phone: [phone]   Fax:  [phone]  Name: Paige Lozano MRN: [id number] Date of Birth: 1966/10/12

## 2017-01-29 NOTE — Progress Notes (Signed)
   THERAPIST PROGRESS NOTE  Session Time: 11:05 AM to 11:55 AM  Participation Level: Active  Behavioral Response: CasualAlertDysphoric and Tearful  Type of Therapy: Individual Therapy  Treatment Goals addressed:  continue to make positive strides in motivation and attitude, decrease in anxiety,and learning and applying positive coping strategies to cope with anxiety, decrease in PTSD symptoms  Interventions: Solution Focused, Strength-based, Supportive and Other: emotional regulation strategies  Summary: Melana Hingle is a 51 y.o. female who presents with relating that it has been difficult for past and going to girlfriends and being around somebody supportive has been helpful. Tearful in session in describing some of her stressors. Shared that she has been independent throughout her life but that she is "ready to lean" and sad because she is still by herself. Tearful and described feeling sad because of not being where she wants to be in relationship with ex-husband but also realizing that she has to consider his timing and readiness in moving forward. She said that she is sad but concerned about staying there too long would not be good for her and push her to a manic phase. Relates that she has had to deal with one thing after another including being sick so worn out. Described feeling fatigued all the time, problems related to having hand surgery done, new medications for MS. She said that it gets to the point where enough is enough and it is overwhelming. She said she is home sick and when she gets to this point she wants to take off. Discussed things on the horizon to help her motivate, patient relates that taking a beach trip may help and she will take baby steps such as going to the pool after this. Discussed the pros and cons of bolting and patient recognizes that "so many things have to happen" before she is ready to move back. Reviewed session and patient said that today brought up that she  "feels like crap". Therapist pointed out ways it was healthy management of emotions.  Suicidal/Homicidal: No  Therapist Response: Help patient to process through emotions related to stressors. Validated patient on feeling overwhelming emotions related to many stressors. Helped patient to label emotions for better coping but also explored sources of overwhelming emotions in order to provide supportive interventions and then help patient gain insight to implementing coping strategies to help her with stressors and mood. Encouraging patient on regrouping and through exploration with patient helped to identify strategies that would be helpful such as taking baby steps. Reinforced insight and encouraged patient to continue to make choices that are helpful even when dealing with overwhelming emotions.  Utilize intervention of helping patient to find purpose and meaning to help with coping. Provided strength based and supportive interventions.   Plan: Return again in 1 week.2. Continue to provide supportive interventions and help patient in learning and applying emotional regulation strategies.   Diagnosis: Axis I:  bipolar 2 disorder, most recent episode depressed, PTSD, generalized anxiety disorder    Axis II: No diagnosis    Bowman,Mary A, LCSW 01/29/2017

## 2017-02-01 ENCOUNTER — Other Ambulatory Visit: Payer: Self-pay | Admitting: Physician Assistant

## 2017-02-01 DIAGNOSIS — G35 Multiple sclerosis: Secondary | ICD-10-CM | POA: Diagnosis not present

## 2017-02-02 ENCOUNTER — Encounter: Payer: Self-pay | Admitting: Rehabilitative and Restorative Service Providers"

## 2017-02-02 DIAGNOSIS — G35 Multiple sclerosis: Secondary | ICD-10-CM | POA: Diagnosis not present

## 2017-02-03 DIAGNOSIS — M1811 Unilateral primary osteoarthritis of first carpometacarpal joint, right hand: Secondary | ICD-10-CM | POA: Diagnosis not present

## 2017-02-03 DIAGNOSIS — F17210 Nicotine dependence, cigarettes, uncomplicated: Secondary | ICD-10-CM | POA: Diagnosis not present

## 2017-02-03 DIAGNOSIS — Z981 Arthrodesis status: Secondary | ICD-10-CM | POA: Diagnosis not present

## 2017-02-03 DIAGNOSIS — G5601 Carpal tunnel syndrome, right upper limb: Secondary | ICD-10-CM | POA: Diagnosis not present

## 2017-02-03 DIAGNOSIS — Z888 Allergy status to other drugs, medicaments and biological substances status: Secondary | ICD-10-CM | POA: Diagnosis not present

## 2017-02-03 DIAGNOSIS — M19049 Primary osteoarthritis, unspecified hand: Secondary | ICD-10-CM | POA: Diagnosis not present

## 2017-02-03 DIAGNOSIS — J449 Chronic obstructive pulmonary disease, unspecified: Secondary | ICD-10-CM | POA: Diagnosis not present

## 2017-02-04 ENCOUNTER — Ambulatory Visit (INDEPENDENT_AMBULATORY_CARE_PROVIDER_SITE_OTHER): Payer: Medicare Other | Admitting: Rehabilitative and Restorative Service Providers"

## 2017-02-04 ENCOUNTER — Encounter: Payer: Self-pay | Admitting: Rehabilitative and Restorative Service Providers"

## 2017-02-04 DIAGNOSIS — M25512 Pain in left shoulder: Secondary | ICD-10-CM | POA: Diagnosis not present

## 2017-02-04 DIAGNOSIS — M25612 Stiffness of left shoulder, not elsewhere classified: Secondary | ICD-10-CM | POA: Diagnosis present

## 2017-02-04 DIAGNOSIS — R293 Abnormal posture: Secondary | ICD-10-CM | POA: Diagnosis not present

## 2017-02-04 DIAGNOSIS — M6281 Muscle weakness (generalized): Secondary | ICD-10-CM

## 2017-02-04 DIAGNOSIS — G8929 Other chronic pain: Secondary | ICD-10-CM

## 2017-02-04 NOTE — Therapy (Signed)
[place], [place], [address] Phone: [phone]   Fax:  [phone]  Physical Therapy Treatment  Patient Details  Name: Paige Lozano MRN: [id number] Date of Birth: 08-12-1966 Referring Provider: Dr Tania Ade  Encounter Date: 02/04/2017      PT End of Session - 02/04/17 1109    Visit Number 33   Number of Visits 44   Date for PT Re-Evaluation 03/12/17   Authorization Time Period G-code at visit 43   PT Start Time 1107   PT Stop Time 1154   PT Time Calculation (min) 47 min   Activity Tolerance Patient tolerated treatment well      Past Medical History:  Diagnosis Date  . Anxiety   . Bipolar 2 disorder, major depressive episode ([place])   . Complication of anesthesia   . COPD (chronic obstructive pulmonary disease) ([place])    smoker  . Fibromyalgia   . Insomnia   . Multiple sclerosis ([place])   . PONV (postoperative nausea and vomiting)   . Pulmonary embolism ([place]) 2000  . Sleep apnea    mild OSA no CPAP  . Ulcerative colitis [facility])     Past Surgical History:  Procedure Laterality Date  . FOOT SURGERY Left    bone spur  . HEMORRHOID SURGERY    . HUMERUS FRACTURE SURGERY Left   . MYOMECTOMY    . NASAL SINUS SURGERY    . SHOULDER ARTHROSCOPY WITH SUBACROMIAL DECOMPRESSION AND BICEP TENDON REPAIR Left 07/27/2016   Procedure: SHOULDER ARTHROSCOPY DEBRIDEMENT ROTATOR CUFF AND LABRUM, SUBACROMIAL DECOMPRESSION, BICEPS TENODESIS;  Surgeon: Tania Ade, MD;  Location: [facility];  Service: Orthopedics;  Laterality: Left;  SHOULDER ARTHROSCOPY DEBRIDEMENT ROTATOR CUFF AND LABRUM, SUBACROMIAL DECOMPRESSION, BICEPS TENOTOMY, POSSIBLE TENODESIS  . THUMB ARTHROSCOPY     5 surgeries on left thumb, 4 surgeries on right  . TONSILLECTOMY AND ADENOIDECTOMY      There were no vitals filed for this visit.      Subjective Assessment - 02/04/17 1109    Subjective Patient reports  that she meet with the surgeon yesterday - she will have Rt CT surgery 02/11/17 and Lt hand/thumb reconstruction 5/18. She received infusion for MS Monday and Tuesday. She is working on shoulder strengthening and that is going well. Not feeling well today - tired from infusion.    Currently in Pain? No/denies   Pain Location Shoulder   Pain Orientation Left;Anterior   Pain Descriptors / Indicators Sore            [facility] PT Assessment - 02/04/17 0001      Assessment   Medical Diagnosis Lt shoulder scope, biceps tenotomy & open tenodesis   Referring Provider Dr Tania Ade   Onset Date/Surgical Date 07/27/16   Hand Dominance Right     Strength   Left Shoulder Flexion 3+/5   Left Shoulder Extension 5/5   Left Shoulder ABduction 4-/5   Left Shoulder Internal Rotation 4/5   Left Shoulder External Rotation 4-/5     Palpation   Palpation comment continued improvement in tenderness and muscular banding/knotted area Lt biceps prominent proximal to proximal incision                      [facility] Adult PT Treatment/Exercise - 02/04/17 0001      Shoulder Exercises: Isometric Strengthening   Extension 5X10"  standing arm at side;arm overhead in flexion -orange ballx2   External Rotation 5X10"  orange ball 2 sets    ABduction 5X10"  orange ball 2 sets of 10     Shoulder Exercises: Stretch   Other Shoulder Stretches doorway stretch 3 positions 30 sec x 2 reps each      Moist Heat Therapy   Number Minutes Moist Heat 15 Minutes   Moist Heat Location Shoulder  Lt     Electrical Stimulation   Electrical Stimulation Location Lt shoudler/biceps/ant deltoid   Electrical Stimulation Action IFC   Electrical Stimulation Parameters to tolerance   Electrical Stimulation Goals Pain;Tone     Ultrasound   Ultrasound Location anterior shoulder/biceps/anterior deltoid   Ultrasound Parameters 1.2 w/cm2; 1 mHz; 100%; 8 min    Ultrasound Goals Pain;Other (Comment)  muscular  tightness      Manual Therapy   Manual therapy comments pt supine   Soft tissue mobilization Lt anterior arm/biceps - scar massage to proximal incision Lt anterior shoudler    Myofascial Release Lt bicep, long head and musculotendinous junction          Trigger Point Dry Needling - 02/04/17 1150    Consent Given? Yes   Muscles Treated Upper Body --  Lt anterior deltoid/biceps x 2  palpable decreased tightness                   PT Long Term Goals - 02/04/17 1116      PT LONG TERM GOAL #1   Title I with advanced HEP to include water classes ( 03/12/17)    Time 18   Period Weeks   Status On-going     PT LONG TERM GOAL #2   Title Patient reports that she can lift Lt UE overhead without pain or discomfort to improve functional Lt UE activities 03/12/17   Time 18   Period Weeks   Status On-going     PT LONG TERM GOAL #3   Title increase strength Lt UE to 5-/5 to 5/5 throughout 03/12/17   Time 18   Period Weeks   Status On-going     PT LONG TERM GOAL #4   Title improve FOTO =/< 38% limited 03/12/17   Time 18   Period Weeks   Status On-going     PT LONG TERM GOAL #5   Title Walk and play with dogs without difficulty ( 03/12/17)    Time 18   Period Weeks   Status On-going     PT LONG TERM GOAL #6   Title Decrease tightness and banding Lt anterior arm at deltoid/biceps thus decreasing pain and allowing pt to progress with funcitonal activity level 03/12/17   Time 18   Period Weeks   Status On-going               Plan - 02/04/17 1117    Clinical Impression Statement Continues to have increasing exercise tolerance Lt shoudler. She has had other medical complications to deal with this week. She demonstrates some increasing strength Lt shoulder abduction and IR. She has less palpable tightness and banding through the area of tightness and scaring anterior Lt shoudler. Continues to be limited with exercise due to Rt CT; Lt thumb fracture; MS flare up. Progress with  Lt shoulder strengthening will be dependent upon additional medical complications.    Rehab Potential Good   PT Frequency 2x / week   PT Duration 6 weeks   PT Treatment/Interventions Moist Heat;Ultrasound;Therapeutic exercise;Dry needling;Taping;Vasopneumatic Device;Manual techniques;Cryotherapy;Electrical Stimulation;Passive range of motion;Patient/family education   PT Next Visit Plan continue  with  DN; modalities; ionto; manual work Psychologist, forensic per MD request, progress strengthening-give aquatic HEP    Consulted and Agree with Plan of Care Patient      Patient will benefit from skilled therapeutic intervention in order to improve the following deficits and impairments:  Postural dysfunction, Increased edema, Decreased strength, Pain, Impaired UE functional use, Increased muscle spasms  Visit Diagnosis: Stiffness of left shoulder, not elsewhere classified  Muscle weakness (generalized)  Abnormal posture  Chronic left shoulder pain     Problem List Patient Active Problem List   Diagnosis Date Noted  . Hand injury, left, subsequent encounter 12/30/2016  . Hot flashes 12/29/2016  . Body aches 12/29/2016  . Right wrist injury 12/29/2016  . Itching 12/29/2016  . Sinusitis 11/30/2016  . History of pulmonary embolus (PE) 07/21/2016  . Right elbow pain 07/21/2016  . GAD (generalized anxiety disorder) 07/20/2016  . Primary osteoarthritis of right knee 06/01/2016  . Anxiety state 06/01/2016  . Left shoulder pain 06/01/2016  . Multiple sclerosis ([place]) 05/29/2016  . Fibromyalgia 05/29/2016  . 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 Insomnia 05/29/2016  . Ulcerative pancolitis without complication ([place]) 09/08/5944  . Bipolar 2 disorder ([place]) 05/29/2016  . Current smoker 05/29/2016    Everardo All PT, MPH  02/04/2017, 12:07 PM  [facility] [place], [place], [address] Phone: [phone]   Fax:   [phone]  Name: Adylee Leonardo MRN: [id number] Date of Birth: 19-Sep-1966

## 2017-02-10 ENCOUNTER — Encounter: Payer: Self-pay | Admitting: Physical Therapy

## 2017-02-12 ENCOUNTER — Encounter: Payer: Self-pay | Admitting: Rehabilitative and Restorative Service Providers"

## 2017-02-12 ENCOUNTER — Ambulatory Visit (INDEPENDENT_AMBULATORY_CARE_PROVIDER_SITE_OTHER): Payer: Medicare Other | Admitting: Licensed Clinical Social Worker

## 2017-02-12 ENCOUNTER — Ambulatory Visit (INDEPENDENT_AMBULATORY_CARE_PROVIDER_SITE_OTHER): Payer: Medicare Other | Admitting: Rehabilitative and Restorative Service Providers"

## 2017-02-12 DIAGNOSIS — M25612 Stiffness of left shoulder, not elsewhere classified: Secondary | ICD-10-CM | POA: Diagnosis present

## 2017-02-12 DIAGNOSIS — F411 Generalized anxiety disorder: Secondary | ICD-10-CM

## 2017-02-12 DIAGNOSIS — M6281 Muscle weakness (generalized): Secondary | ICD-10-CM | POA: Diagnosis not present

## 2017-02-12 DIAGNOSIS — F4310 Post-traumatic stress disorder, unspecified: Secondary | ICD-10-CM | POA: Diagnosis not present

## 2017-02-12 DIAGNOSIS — G8929 Other chronic pain: Secondary | ICD-10-CM | POA: Diagnosis not present

## 2017-02-12 DIAGNOSIS — R293 Abnormal posture: Secondary | ICD-10-CM | POA: Diagnosis not present

## 2017-02-12 DIAGNOSIS — M25512 Pain in left shoulder: Secondary | ICD-10-CM | POA: Diagnosis not present

## 2017-02-12 DIAGNOSIS — F3181 Bipolar II disorder: Secondary | ICD-10-CM | POA: Diagnosis not present

## 2017-02-12 NOTE — Progress Notes (Signed)
   THERAPIST PROGRESS NOTE  Session Time: 11:05 AM to 11:55 AM  Participation Level: Active  Behavioral Response: CasualAlertDysphoric, tired  Type of Therapy: Individual Therapy  Treatment Goals addressed: ontinue to make positive strides in motivation and attitude, decrease in anxiety,and learning and applying positive coping strategies to cope with anxiety, decrease in PTSD symptoms  Interventions: Solution Focused, Strength-based, Supportive and Other: stress management, mentalization  Summary: Paige Lozano is a 51 y.o. female who presents with being tired. Relates that it is related to MS, had two infusions but can't start medications for 28 days. 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 She is stressed because she doesn't have somebody dependable that she can count on to help her after the surgery. She will need 6 months to recuperate. She needs to control things and a lot of things aren't out of her control. Discussed with therapist with turning to spirituality and faith that it will be alright but still can be a challenge. Discussed coping strategy of taking a negative thought and automatically turn it to God. She just came back from the beach and that helps a lot with her mental health. Not able to do physical exercise and that usually helps in coping. Other coping for her is reading. Realizes that having something to look forward to helps and her young friend will be coming to visit her in June.      Suicidal/Homicidal: No  Therapist Response: Reviewed focus and symptoms. Patient to process feelings around stressors. Therapist provided patient with ongoing emotional support and encouragement.  Normalized her feelings. Reinforced the importance of client staying focused on her own strengths and resources and resiliency.  Processed various strategies for dealing with stressors. Identified helpful coping strategies including spirituality, reading having supports and having something in the future to look forward to. Discussed using reflective skills to identify one's mental state so that one can begin to make positive changes to one's mental state.   Plan: Patient will schedule appointment once therapist returns from leave.2.Patient will gain insight and implement effective coping to manage stressors.3. Continue to provide supportive interventions and help patient in learning and applying emotional regulation strategies.  Diagnosis: Axis I: bipolar 2 disorder, most recent episode depressed, PTSD, generalized anxiety disorder    Axis II: No diagnosis    Bowman,Mary A, LCSW 02/12/2017

## 2017-02-12 NOTE — Therapy (Signed)
[place], [place], [address] Phone: [phone]   Fax:  [phone]  Physical Therapy Treatment  Patient Details  Name: Paige Lozano MRN: [id number] Date of Birth: 01/13/1966 Referring Provider: Dr Tania Ade  Encounter Date: 02/12/2017      PT End of Session - 02/12/17 0935    Visit Number 34   Number of Visits 44   Date for PT Re-Evaluation 03/12/17   Authorization Time Period G-code at visit 15   PT Start Time 0933   PT Stop Time 1027   PT Time Calculation (min) 54 min   Activity Tolerance Patient tolerated treatment well      Past Medical History:  Diagnosis Date  . Anxiety   . Bipolar 2 disorder, major depressive episode ([place])   . Complication of anesthesia   . COPD (chronic obstructive pulmonary disease) ([place])    smoker  . Fibromyalgia   . Insomnia   . Multiple sclerosis ([place])   . PONV (postoperative nausea and vomiting)   . Pulmonary embolism ([place]) 2000  . Sleep apnea    mild OSA no CPAP  . Ulcerative colitis [facility])     Past Surgical History:  Procedure Laterality Date  . FOOT SURGERY Left    bone spur  . HEMORRHOID SURGERY    . HUMERUS FRACTURE SURGERY Left   . MYOMECTOMY    . NASAL SINUS SURGERY    . SHOULDER ARTHROSCOPY WITH SUBACROMIAL DECOMPRESSION AND BICEP TENDON REPAIR Left 07/27/2016   Procedure: SHOULDER ARTHROSCOPY DEBRIDEMENT ROTATOR CUFF AND LABRUM, SUBACROMIAL DECOMPRESSION, BICEPS TENODESIS;  Surgeon: Tania Ade, MD;  Location: [facility];  Service: Orthopedics;  Laterality: Left;  SHOULDER ARTHROSCOPY DEBRIDEMENT ROTATOR CUFF AND LABRUM, SUBACROMIAL DECOMPRESSION, BICEPS TENOTOMY, POSSIBLE TENODESIS  . THUMB ARTHROSCOPY     5 surgeries on left thumb, 4 surgeries on right  . TONSILLECTOMY AND ADENOIDECTOMY      There were no vitals filed for this visit.      Subjective Assessment - 02/12/17 0936    Subjective Paige Lozano will have Rt  CT surgery 02/23/17 and Lt hand/thumb reconstruction 03/11/17. She was at the beach for a few days this week. She is working on shoulder strengthening at home. Tired.    Currently in Pain? No/denies            [facility] PT Assessment - 02/12/17 0001      Assessment   Medical Diagnosis Lt shoulder scope, biceps tenotomy & open tenodesis   Referring Provider Dr Tania Ade   Onset Date/Surgical Date 07/27/16   Hand Dominance Right     Strength   Left Shoulder Flexion 4-/5   Left Shoulder Extension 5/5   Left Shoulder ABduction 4-/5   Left Shoulder Internal Rotation 4/5   Left Shoulder External Rotation 4-/5     Palpation   Palpation comment continued improvement in tenderness and muscular banding/knotted area Lt biceps prominent proximal to proximal incision                      [facility] Adult PT Treatment/Exercise - 02/12/17 0001      Shoulder Exercises: Prone   Flexion Strengthening;Left;10 reps;Weights  partial range    Flexion Weight (lbs) 2   Extension Left;15 reps;Weights   Extension Weight (lbs) 2   Other Prone Exercises Prone row with LUE x 2 sets x 10 reps 2# wt     Shoulder Exercises: Therapy Ball   Other Therapy  Ball Exercises ball on wall at chest height and overhead working on various movements including flex/ext; horizontal ab/add; circles - CW/CCW 3-4 min      Shoulder Exercises: Isometric Strengthening   Extension 5X10"  standing arm at side;arm overhead in flexion -orange ballx2   External Rotation 5X10"  orange ball 2 sets    ABduction 5X10"  orange ball 2 sets of 10     Shoulder Exercises: Stretch   Other Shoulder Stretches doorway stretch 3 positions 30 sec x 2 reps each      Moist Heat Therapy   Number Minutes Moist Heat 20 Minutes   Moist Heat Location Shoulder  Lt     Electrical Stimulation   Electrical Stimulation Location Lt shoudler/biceps/ant deltoid   Electrical Stimulation Action IFC   Electrical Stimulation Parameters to  tolerance   Electrical Stimulation Goals Pain;Tone     Ultrasound   Ultrasound Location anterior arm - biceps/anterior deltoid   Ultrasound Parameters 1.5 w/cm2; 1 mHz; 100%; 8 min    Ultrasound Goals Pain;Other (Comment)  muscular tightness      Iontophoresis   Type of Iontophoresis Dexamethasone   Location Lt ant arm - deltoid/biceps area    Dose 80 mAmp   Time 6-8 hours      Manual Therapy   Manual therapy comments pt supine   Soft tissue mobilization Lt anterior arm/biceps - scar massage to proximal incision Lt anterior shoudler    Myofascial Release Lt bicep, long head and musculotendinous junction          Trigger Point Dry Needling - 02/12/17 1021    Consent Given? Yes   Muscles Treated Upper Body --  ant deltoid/biceps x 2 - decreased palpable tightness noted                   PT Long Term Goals - 02/04/17 1116      PT LONG TERM GOAL #1   Title I with advanced HEP to include water classes ( 03/12/17)    Time 18   Period Weeks   Status On-going     PT LONG TERM GOAL #2   Title Patient reports that she can lift Lt UE overhead without pain or discomfort to improve functional Lt UE activities 03/12/17   Time 18   Period Weeks   Status On-going     PT LONG TERM GOAL #3   Title increase strength Lt UE to 5-/5 to 5/5 throughout 03/12/17   Time 18   Period Weeks   Status On-going     PT LONG TERM GOAL #4   Title improve FOTO =/< 38% limited 03/12/17   Time 18   Period Weeks   Status On-going     PT LONG TERM GOAL #5   Title Walk and play with dogs without difficulty ( 03/12/17)    Time 18   Period Weeks   Status On-going     PT LONG TERM GOAL #6   Title Decrease tightness and banding Lt anterior arm at deltoid/biceps thus decreasing pain and allowing pt to progress with funcitonal activity level 03/12/17   Time 18   Period Weeks   Status On-going               Plan - 02/12/17 1022    Clinical Impression Statement Exercise tolerance varies  with how patient feels - fatigue related to MS and activity level. Surgeries are scheduled. She is gradually progressing with strengthening. There is less palpable tightness  through the anterior arm. Gradually progressing with strengthening goals.    Rehab Potential Good   PT Frequency 2x / week   PT Duration 6 weeks   PT Treatment/Interventions Moist Heat;Ultrasound;Therapeutic exercise;Dry needling;Taping;Vasopneumatic Device;Manual techniques;Cryotherapy;Electrical Stimulation;Passive range of motion;Patient/family education   PT Next Visit Plan continue with  DN; modalities; ionto; manual work Psychologist, forensic per MD request, progress strengthening   Consulted and Agree with Plan of Care Patient      Patient will benefit from skilled therapeutic intervention in order to improve the following deficits and impairments:  Postural dysfunction, Increased edema, Decreased strength, Pain, Impaired UE functional use, Increased muscle spasms  Visit Diagnosis: Stiffness of left shoulder, not elsewhere classified  Muscle weakness (generalized)  Abnormal posture  Chronic left shoulder pain  Acute pain of left shoulder     Problem List Patient Active Problem List   Diagnosis Date Noted  . Hand injury, left, subsequent encounter 12/30/2016  . Hot flashes 12/29/2016  . Body aches 12/29/2016  . Right wrist injury 12/29/2016  . Itching 12/29/2016  . Sinusitis 11/30/2016  . History of pulmonary embolus (PE) 07/21/2016  . Right elbow pain 07/21/2016  . GAD (generalized anxiety disorder) 07/20/2016  . Primary osteoarthritis of right knee 06/01/2016  . Anxiety state 06/01/2016  . Left shoulder pain 06/01/2016  . Multiple sclerosis ([place]) 05/29/2016  . Fibromyalgia 05/29/2016  . 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 Insomnia 05/29/2016  . Ulcerative pancolitis without complication ([place]) 21/19/4174  . Bipolar 2 disorder ([place]) 05/29/2016  . Current smoker 05/29/2016    Paige Lozano Nilda Simmer PT, MPH   02/12/2017, 10:27 AM  [facility] [place], [place], [address] Phone: [phone]   Fax:  [phone]  Name: Paige Lozano MRN: [id number] Date of Birth: 1965-12-13

## 2017-02-17 ENCOUNTER — Encounter: Payer: Self-pay | Admitting: Rehabilitative and Restorative Service Providers"

## 2017-02-19 ENCOUNTER — Ambulatory Visit (HOSPITAL_COMMUNITY): Payer: Self-pay | Admitting: Licensed Clinical Social Worker

## 2017-02-19 ENCOUNTER — Ambulatory Visit (INDEPENDENT_AMBULATORY_CARE_PROVIDER_SITE_OTHER): Payer: Medicare Other | Admitting: Physical Therapy

## 2017-02-19 DIAGNOSIS — M6281 Muscle weakness (generalized): Secondary | ICD-10-CM | POA: Diagnosis not present

## 2017-02-19 DIAGNOSIS — M25612 Stiffness of left shoulder, not elsewhere classified: Secondary | ICD-10-CM

## 2017-02-19 DIAGNOSIS — R293 Abnormal posture: Secondary | ICD-10-CM

## 2017-02-19 NOTE — Therapy (Signed)
[place], [place], [address] Phone: [phone]   Fax:  [phone]  Physical Therapy Treatment  Patient Details  Name: Paige Lozano MRN: [id number] Date of Birth: 1966-06-02 Referring Provider: Dr. Tania Ade  Encounter Date: 02/19/2017      PT End of Session - 02/19/17 1025    Visit Number 35   Number of Visits 44   Date for PT Re-Evaluation 03/12/17   Authorization Time Period G-code at visit 71   PT Start Time 1020   PT Stop Time 1104   PT Time Calculation (min) 44 min      Past Medical History:  Diagnosis Date  . Anxiety   . Bipolar 2 disorder, major depressive episode ([place])   . Complication of anesthesia   . COPD (chronic obstructive pulmonary disease) ([place])    smoker  . Fibromyalgia   . Insomnia   . Multiple sclerosis ([place])   . PONV (postoperative nausea and vomiting)   . Pulmonary embolism ([place]) 2000  . Sleep apnea    mild OSA no CPAP  . Ulcerative colitis [facility])     Past Surgical History:  Procedure Laterality Date  . FOOT SURGERY Left    bone spur  . HEMORRHOID SURGERY    . HUMERUS FRACTURE SURGERY Left   . MYOMECTOMY    . NASAL SINUS SURGERY    . SHOULDER ARTHROSCOPY WITH SUBACROMIAL DECOMPRESSION AND BICEP TENDON REPAIR Left 07/27/2016   Procedure: SHOULDER ARTHROSCOPY DEBRIDEMENT ROTATOR CUFF AND LABRUM, SUBACROMIAL DECOMPRESSION, BICEPS TENODESIS;  Surgeon: Tania Ade, MD;  Location: [facility];  Service: Orthopedics;  Laterality: Left;  SHOULDER ARTHROSCOPY DEBRIDEMENT ROTATOR CUFF AND LABRUM, SUBACROMIAL DECOMPRESSION, BICEPS TENOTOMY, POSSIBLE TENODESIS  . THUMB ARTHROSCOPY     5 surgeries on left thumb, 4 surgeries on right  . TONSILLECTOMY AND ADENOIDECTOMY      There were no vitals filed for this visit.      Subjective Assessment - 02/19/17 1026    Subjective Pt has questions of what she can do at gym with arms; she is going to  start working out with trainer.  She has been in pool 4-6 days per week. She is anxious about her upcoming surgery.    Patient Stated Goals return to swimming, water classes, walk dogs.    Currently in Pain? No/denies   Pain Score 0-No pain            [facility] PT Assessment - 02/19/17 0001      Assessment   Medical Diagnosis Lt shoulder scope, biceps tenotomy & open tenodesis   Referring Provider Dr. Tania Ade   Onset Date/Surgical Date 07/27/16     Precautions   Precaution Comments MS, h/o old Lt humerous fx      Strength   Left Shoulder Flexion --  5-/5   Left Shoulder Extension 5/5   Left Shoulder ABduction 4-/5   Left Shoulder Internal Rotation 4+/5   Left Shoulder External Rotation 4/5         [facility] Adult PT Treatment/Exercise - 02/19/17 0001      Shoulder Exercises: Standing   External Rotation Left;15 reps;Theraband   Theraband Level (Shoulder External Rotation) Level 2 (Red)   Internal Rotation Strengthening;Left;15 reps;Theraband   Theraband Level (Shoulder Internal Rotation) Level 2 (Red)   Flexion Strengthening;Left;20 reps;Weights   Theraband Level (Shoulder Flexion) Level 2 (Red)  [place] 4- red band x 15   Shoulder Flexion Weight (lbs) 2  ABduction Left;20 reps  with scap squeeze   Shoulder ABduction Weight (lbs) 2   ABduction Limitations VC to avoid trunk leaning     Shoulder Exercises: ROM/Strengthening   UBE (Upper Arm Bike) L2: 1.5 min each direction.      Shoulder Exercises: Stretch   Other Shoulder Stretches Lt bicep stretch x 20 sec x 2    Other Shoulder Stretches doorway stretch 3 positions 30 sec x 2 reps each      Ultrasound   Ultrasound Location anterior Lt shoulder    Ultrasound Parameters 1.3 w/cm2, 100%, 8 min    Ultrasound Goals Pain;Other (Comment)  muscular tightness                      PT Long Term Goals - 02/19/17 1110      PT LONG TERM GOAL #1   Title I with advanced HEP to include water classes (  03/12/17)    Time 18   Period Weeks   Status Partially Met  attending pool each week.      PT LONG TERM GOAL #2   Title Patient reports that she can lift Lt UE overhead without pain or discomfort to improve functional Lt UE activities 03/12/17   Time 18   Period Weeks   Status On-going     PT LONG TERM GOAL #3   Title increase strength Lt UE to 5-/5 to 5/5 throughout 03/12/17   Time 18   Period Weeks   Status On-going  progressing     PT LONG TERM GOAL #4   Title improve FOTO =/< 38% limited 03/12/17   Time 18   Period Weeks   Status On-going     PT LONG TERM GOAL #5   Title Walk and play with dogs without difficulty ( 03/12/17)    Time 18   Period Weeks   Status On-going     PT LONG TERM GOAL #6   Title Decrease tightness and banding Lt anterior arm at deltoid/biceps thus decreasing pain and allowing pt to progress with funcitonal activity level 03/12/17   Time 18   Period Weeks   Status On-going               Plan - 02/19/17 1114    Clinical Impression Statement Pt demonstrating improved Lt shoulder strength.  She tolerated increased resistance with current band exercises without any symptoms.  Lt ant tissue continues to feel less tight with palpation.  Pt now attending pool numerous times per wk, partially meeting LTG#1.  However, exercise will be limited after surgery on Tuesday.     Rehab Potential Good   PT Frequency 2x / week   PT Duration 6 weeks   PT Treatment/Interventions Moist Heat;Ultrasound;Therapeutic exercise;Dry needling;Taping;Vasopneumatic Device;Manual techniques;Cryotherapy;Electrical Stimulation;Passive range of motion;Patient/family education   PT Next Visit Plan continue with  DN; modalities; ionto; manual work Lt shoulder per MD request, progress strengthening as tolerated   Consulted and Agree with Plan of Care Patient      Patient will benefit from skilled therapeutic intervention in order to improve the following deficits and impairments:   Postural dysfunction, Increased edema, Decreased strength, Pain, Impaired UE functional use, Increased muscle spasms  Visit Diagnosis: Stiffness of left shoulder, not elsewhere classified  Muscle weakness (generalized)  Abnormal posture     Problem List Patient Active Problem List   Diagnosis Date Noted  . Hand injury, left, subsequent encounter 12/30/2016  . Hot flashes 12/29/2016  .  Body aches 12/29/2016  . Right wrist injury 12/29/2016  . Itching 12/29/2016  . Sinusitis 11/30/2016  . History of pulmonary embolus (PE) 07/21/2016  . Right elbow pain 07/21/2016  . GAD (generalized anxiety disorder) 07/20/2016  . Primary osteoarthritis of right knee 06/01/2016  . Anxiety state 06/01/2016  . Left shoulder pain 06/01/2016  . Multiple sclerosis ([place]) 05/29/2016  . Fibromyalgia 05/29/2016  . 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 Insomnia 05/29/2016  . Ulcerative pancolitis without complication ([place]) 50/01/7047  . Bipolar 2 disorder ([place]) 05/29/2016  . Current smoker 05/29/2016   Kerin Perna, PTA 02/19/17 11:18 AM  [place], [place], [address] Phone: [phone]   Fax:  [phone]  Name: Paige Lozano MRN: [id number] Date of Birth: 1966-03-18

## 2017-02-22 ENCOUNTER — Encounter: Payer: Self-pay | Admitting: Rehabilitative and Restorative Service Providers"

## 2017-02-22 ENCOUNTER — Ambulatory Visit (INDEPENDENT_AMBULATORY_CARE_PROVIDER_SITE_OTHER): Payer: Medicare Other | Admitting: Rehabilitative and Restorative Service Providers"

## 2017-02-22 DIAGNOSIS — M6281 Muscle weakness (generalized): Secondary | ICD-10-CM | POA: Diagnosis not present

## 2017-02-22 DIAGNOSIS — R293 Abnormal posture: Secondary | ICD-10-CM

## 2017-02-22 DIAGNOSIS — M25512 Pain in left shoulder: Secondary | ICD-10-CM

## 2017-02-22 DIAGNOSIS — G8929 Other chronic pain: Secondary | ICD-10-CM

## 2017-02-22 DIAGNOSIS — M25612 Stiffness of left shoulder, not elsewhere classified: Secondary | ICD-10-CM | POA: Diagnosis present

## 2017-02-22 NOTE — Therapy (Addendum)
[place], [place], [address] Phone: [phone]   Fax:  [phone]  Physical Therapy Treatment  Patient Details  Name: Paige Lozano MRN: [id number] Date of Birth: Dec 04, 1965 Referring Provider: Dr Tania Ade   Encounter Date: 02/22/2017    Past Medical History:  Diagnosis Date  . Anxiety   . Bipolar 2 disorder, major depressive episode ([place])   . Complication of anesthesia   . COPD (chronic obstructive pulmonary disease) ([place])    smoker  . Fibromyalgia   . Insomnia   . Multiple sclerosis ([place])   . PONV (postoperative nausea and vomiting)   . Pulmonary embolism ([place]) 2000  . Sleep apnea    mild OSA no CPAP  . Ulcerative colitis [facility])     Past Surgical History:  Procedure Laterality Date  . FOOT SURGERY Left    bone spur  . HEMORRHOID SURGERY    . HUMERUS FRACTURE SURGERY Left   . MYOMECTOMY    . NASAL SINUS SURGERY    . SHOULDER ARTHROSCOPY WITH SUBACROMIAL DECOMPRESSION AND BICEP TENDON REPAIR Left 07/27/2016   Procedure: SHOULDER ARTHROSCOPY DEBRIDEMENT ROTATOR CUFF AND LABRUM, SUBACROMIAL DECOMPRESSION, BICEPS TENODESIS;  Surgeon: Tania Ade, MD;  Location: [facility];  Service: Orthopedics;  Laterality: Left;  SHOULDER ARTHROSCOPY DEBRIDEMENT ROTATOR CUFF AND LABRUM, SUBACROMIAL DECOMPRESSION, BICEPS TENOTOMY, POSSIBLE TENODESIS  . THUMB ARTHROSCOPY     5 surgeries on left thumb, 4 surgeries on right  . TONSILLECTOMY AND ADENOIDECTOMY      There were no vitals filed for this visit.      Subjective Assessment - 02/22/17 1111    Subjective No shoulder pain. Using Lt UE for more functional activities. Raked for 4 hours yesterday and did not have pain - notices some soreness but just soreness not pain   Currently in Pain? No/denies            [facility] PT Assessment - 02/22/17 0001      Assessment   Medical Diagnosis Lt shoulder scope, biceps tenotomy &  open tenodesis   Referring Provider Dr Tania Ade    Onset Date/Surgical Date 07/27/16     AROM   AROM Assessment Site --  bilat shoudler ROM WNL's throughout      Strength   Left Shoulder Flexion --  5-/5   Left Shoulder Extension 5/5   Left Shoulder ABduction 4-/5   Left Shoulder Internal Rotation 4+/5   Left Shoulder External Rotation 4/5     Palpation   Palpation comment increased tenderness and muscular banding/knotted area Lt biceps prominent proximal to proximal incision                      [facility] Adult PT Treatment/Exercise - 02/22/17 0001      Neuro Re-ed    Neuro Re-ed Details  working on posture and alignment engaging posterior shoulder girdle      Shoulder Exercises: Standing   Other Standing Exercises scapular retraction with shallow knee bend; scapular retraction with lateral step and trunk rotation Rt; W's with step back x ~20 reps each      Shoulder Exercises: Therapy Ball   Other Therapy Ball Exercises rhythmic stabilization w/ ball in depression; abduction; fwd flexion elbow bent at 90 deg engaging posterior shoudler girdle; PT moving ball as pt stabalized      Shoulder Exercises: Stretch   Other Shoulder Stretches Lt bicep stretch x 20 sec x 2  Other Shoulder Stretches doorway stretch 3 positions 30 sec x 2 reps each      Electrical Stimulation   Electrical Stimulation Location Lt shoudler/biceps/ant deltoid   Electrical Stimulation Action IFC   Electrical Stimulation Parameters to tolerance   Electrical Stimulation Goals Pain;Tone     Ultrasound   Ultrasound Location anterior arm through biceps anterior deltoid area    Ultrasound Parameters 1.5 w/cm2; 1 mHz; 100%; 8 min    Ultrasound Goals Pain;Other (Comment)  muscular tightness      Manual Therapy   Manual therapy comments pt supine   Soft tissue mobilization Lt anterior arm/biceps - scar massage to proximal incision Lt anterior shoudler    Myofascial Release Lt bicep, long  head and musculotendinous junction          Trigger Point Dry Needling - 02/22/17 1144    Consent Given? Yes   Muscles Treated Upper Body --  anterior deltoid/biceps x 2 - decreased palpable tightness                    PT Long Term Goals - 02/19/17 1110      PT LONG TERM GOAL #1   Title I with advanced HEP to include water classes ( 03/12/17)    Time 18   Period Weeks   Status Partially Met  attending pool each week.      PT LONG TERM GOAL #2   Title Patient reports that she can lift Lt UE overhead without pain or discomfort to improve functional Lt UE activities 03/12/17   Time 18   Period Weeks   Status On-going     PT LONG TERM GOAL #3   Title increase strength Lt UE to 5-/5 to 5/5 throughout 03/12/17   Time 18   Period Weeks   Status On-going  progressing     PT LONG TERM GOAL #4   Title improve FOTO =/< 38% limited 03/12/17   Time 18   Period Weeks   Status On-going     PT LONG TERM GOAL #5   Title Walk and play with dogs without difficulty ( 03/12/17)    Time 18   Period Weeks   Status On-going     PT LONG TERM GOAL #6   Title Decrease tightness and banding Lt anterior arm at deltoid/biceps thus decreasing pain and allowing pt to progress with funcitonal activity level 03/12/17   Time 18   Period Weeks   Status On-going               Plan - 02/22/17 1106    Clinical Impression Statement Continues to demonstrate improving Lt shoulder ROM and function. For CT surgery tomorrow and Lt thumb surgery 03/11/17 - discussed modifications for ADL's with surgeries. Lt shoulder is progressing well. Shanti has sinificant increase in muscular tightness to palpatioin through the anterior arm - biceps/deltoid today which is tender to palpation but not painful with function. Patient is increasing with functional use Lt UE.    Rehab Potential Good   PT Frequency 2x / week   PT Duration 6 weeks   PT Treatment/Interventions Moist Heat;Ultrasound;Therapeutic  exercise;Dry needling;Taping;Vasopneumatic Device;Manual techniques;Cryotherapy;Electrical Stimulation;Passive range of motion;Patient/family education   PT Next Visit Plan continue with  DN; modalities; ionto; manual work Lt shoulder per MD request, progress strengthening as tolerated   Consulted and Agree with Plan of Care Patient      Patient will benefit from skilled therapeutic intervention in order to improve the following  deficits and impairments:  Postural dysfunction, Increased edema, Decreased strength, Pain, Impaired UE functional use, Increased muscle spasms  Visit Diagnosis: Stiffness of left shoulder, not elsewhere classified  Muscle weakness (generalized)  Abnormal posture  Chronic left shoulder pain     Problem List Patient Active Problem List   Diagnosis Date Noted  . Hand injury, left, subsequent encounter 12/30/2016  . Hot flashes 12/29/2016  . Body aches 12/29/2016  . Right wrist injury 12/29/2016  . Itching 12/29/2016  . Sinusitis 11/30/2016  . History of pulmonary embolus (PE) 07/21/2016  . Right elbow pain 07/21/2016  . GAD (generalized anxiety disorder) 07/20/2016  . Primary osteoarthritis of right knee 06/01/2016  . Anxiety state 06/01/2016  . Left shoulder pain 06/01/2016  . Multiple sclerosis ([place]) 05/29/2016  . Fibromyalgia 05/29/2016  . 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 Insomnia 05/29/2016  . Ulcerative pancolitis without complication ([place]) 19/62/2297  . Bipolar 2 disorder ([place]) 05/29/2016  . Current smoker 05/29/2016    Avika Carbine Nilda Simmer PT, MPH  02/22/2017, 11:52 AM  [facility] [place], [place], [address] Phone: [phone]   Fax:  [phone]  Name: Salayah Meares MRN: [id number] Date of Birth: 08-15-1966  PHYSICAL THERAPY DISCHARGE SUMMARY  Visits from Start of Care: 36  Current functional level related to goals / functional outcomes: See last progress  note for discharge status   Remaining deficits: Should continue with HEP as tolerated due to wrist/hand surgery   Education / Equipment: HEP  Plan: Patient agrees to discharge.  Patient goals were partially met. Patient is being discharged due to not returning since the last visit.  ?????    Ozzie Knobel P. Helene Kelp PT, MPH 04/09/17 9:23 AM

## 2017-02-23 DIAGNOSIS — G5601 Carpal tunnel syndrome, right upper limb: Secondary | ICD-10-CM | POA: Diagnosis not present

## 2017-03-01 DIAGNOSIS — Z79891 Long term (current) use of opiate analgesic: Secondary | ICD-10-CM | POA: Diagnosis not present

## 2017-03-01 DIAGNOSIS — M25549 Pain in joints of unspecified hand: Secondary | ICD-10-CM | POA: Diagnosis not present

## 2017-03-01 DIAGNOSIS — M5136 Other intervertebral disc degeneration, lumbar region: Secondary | ICD-10-CM | POA: Diagnosis not present

## 2017-03-01 DIAGNOSIS — G894 Chronic pain syndrome: Secondary | ICD-10-CM | POA: Diagnosis not present

## 2017-03-03 ENCOUNTER — Other Ambulatory Visit: Payer: Self-pay | Admitting: Physician Assistant

## 2017-03-05 ENCOUNTER — Encounter: Payer: Self-pay | Admitting: Physician Assistant

## 2017-03-05 ENCOUNTER — Ambulatory Visit (INDEPENDENT_AMBULATORY_CARE_PROVIDER_SITE_OTHER): Payer: Medicare Other | Admitting: Physician Assistant

## 2017-03-05 ENCOUNTER — Ambulatory Visit (INDEPENDENT_AMBULATORY_CARE_PROVIDER_SITE_OTHER): Payer: Medicare Other

## 2017-03-05 VITALS — BP 110/66 | HR 150 | Wt 192.0 lb

## 2017-03-05 DIAGNOSIS — Z720 Tobacco use: Secondary | ICD-10-CM | POA: Diagnosis not present

## 2017-03-05 DIAGNOSIS — M25521 Pain in right elbow: Secondary | ICD-10-CM

## 2017-03-05 DIAGNOSIS — R0789 Other chest pain: Secondary | ICD-10-CM | POA: Diagnosis not present

## 2017-03-05 DIAGNOSIS — Z01818 Encounter for other preprocedural examination: Secondary | ICD-10-CM | POA: Diagnosis not present

## 2017-03-05 DIAGNOSIS — R000 Tachycardia, unspecified: Secondary | ICD-10-CM | POA: Diagnosis not present

## 2017-03-05 DIAGNOSIS — R6883 Chills (without fever): Secondary | ICD-10-CM | POA: Diagnosis not present

## 2017-03-05 DIAGNOSIS — R002 Palpitations: Secondary | ICD-10-CM | POA: Diagnosis not present

## 2017-03-05 DIAGNOSIS — S59901A Unspecified injury of right elbow, initial encounter: Secondary | ICD-10-CM | POA: Diagnosis not present

## 2017-03-05 DIAGNOSIS — K5100 Ulcerative (chronic) pancolitis without complications: Secondary | ICD-10-CM | POA: Diagnosis not present

## 2017-03-05 MED ORDER — VARENICLINE TARTRATE 1 MG PO TABS: 1.0000 mg | ORAL_TABLET | Freq: Two times a day (BID) | ORAL | 5 refills | Status: DC

## 2017-03-05 NOTE — Progress Notes (Signed)
   Subjective:    Patient ID: Paige Lozano, female    DOB: [birth year], 51 y.o.   MRN: [id number]  HPI  Pt is a 51 yo female who presents to the clinic with right elbow pain that is persistent for over a month after a fall when she landed on it. Only hurts when she leans on it. She wants to make sure not broken.   Her pulse was elevated at 120 today. She does complain of feeling some palpitations or "hick ups" with her heart. She is also having some chest tightness from time to time that she cannot explain. Pain is not with exertion. She does try to exercise a few days a week. She has surgery on her left wrist next Thursday with Dr. Truman Lozano. She does have a hx of PE after surgery.    Pt is having chills at night and almost every night. She is going through menopause but never heard of having chills. She wonders if she could have infection. She is nervous about the upcoming surgery.   She is also having diarrhea. She has U/C. Denies any blood in stool. She usually gets a prednisone pack and helps it. No fever.      Review of Systems  All other systems reviewed and are negative.      Objective:   Physical Exam  Constitutional: She is oriented to person, place, and time. She appears well-developed and well-nourished.  HENT:  Head: Normocephalic and atraumatic.  Cardiovascular: Regular rhythm and normal heart sounds.   Tachycardia at 120.   Pulmonary/Chest: Effort normal and breath sounds normal.  Musculoskeletal:  Tenderness to palpation over right elbow and bilateral epicondyles.  NROM and 5/5 strength of right elbow.  No swelling, warmth, redness over right elbow.   Neurological: She is alert and oriented to person, place, and time.  Psychiatric: She has a normal mood and affect. Her behavior is normal.          Assessment & Plan:  Marland KitchenMarland KitchenAnnamary was seen today for left elbow pain and diarrhea.  Diagnoses and all orders for this visit:  Tachycardia -     Exercise Tolerance Test;  Future -     Holter monitor - 24 hour; Future  Currently attempting to quit smoking -     varenicline (CHANTIX CONTINUING MONTH PAK) 1 MG tablet; Take 1 tablet (1 mg total) by mouth 2 (two) times daily.  Ulcerative pancolitis without complication (HCC)  Chest tightness -     Exercise Tolerance Test; Future -     Holter monitor - 24 hour; Future  Right elbow pain -     DG Elbow Complete Right; Future  Chills (without fever) -     CBC with Differential/Platelet -     COMPLETE METABOLIC PANEL WITH GFR -     TSH  Palpitations -     Holter monitor - 24 hour; Future  Preop testing -     Holter monitor - 24 hour; Future   EKG- pulse decreased to 91. LAD. No acute changes. No ST depression or elevation. No arrhthymias.   Concerned about upcoming surgery with elevation pulse, palpitations, hx of PE, and chest tightness. Will order ASAP stress test and holter monitor. I want to make sure she is fully clear for surgery. Labs ordered as well to rule out any underlying infection.   Xray ordered for right elbow. Rest, Ice, Compression. Continue mobic. Consider biofreeze local creams. Avoid pressure for next 2 weeks.

## 2017-03-05 NOTE — Progress Notes (Signed)
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 No acute findings.

## 2017-03-06 LAB — CBC WITH DIFFERENTIAL/PLATELET
BASOS ABS: 0 {cells}/uL (ref 0–200)
Basophils Relative: 0 %
EOS ABS: 174 {cells}/uL (ref 15–500)
EOS PCT: 2 %
HCT: 41.7 % (ref 35.0–45.0)
HEMOGLOBIN: 14.1 g/dL (ref 11.7–15.5)
Lymphocytes Relative: 22 %
Lymphs Abs: 1914 cells/uL (ref 850–3900)
MCH: 29.4 pg (ref 27.0–33.0)
MCHC: 33.8 g/dL (ref 32.0–36.0)
MCV: 87.1 fL (ref 80.0–100.0)
MONOS PCT: 11 %
MPV: 10.3 fL (ref 7.5–12.5)
Monocytes Absolute: 957 cells/uL — ABNORMAL HIGH (ref 200–950)
NEUTROS PCT: 65 %
Neutro Abs: 5655 cells/uL (ref 1500–7800)
PLATELETS: 239 10*3/uL (ref 140–400)
RBC: 4.79 MIL/uL (ref 3.80–5.10)
RDW: 14.9 % (ref 11.0–15.0)
WBC: 8.7 10*3/uL (ref 3.8–10.8)

## 2017-03-06 LAB — COMPLETE METABOLIC PANEL WITH GFR
AG RATIO: 1.8 ratio (ref 1.0–2.5)
ALBUMIN: 4.4 g/dL (ref 3.6–5.1)
ALT: 22 U/L (ref 6–29)
AST: 23 U/L (ref 10–35)
Alkaline Phosphatase: 77 U/L (ref 33–130)
BILIRUBIN TOTAL: 0.5 mg/dL (ref 0.2–1.2)
BUN / CREAT RATIO: 23 ratio — AB (ref 6–22)
BUN: 20 mg/dL (ref 7–25)
CO2: 24 mmol/L (ref 20–31)
Calcium: 9.5 mg/dL (ref 8.6–10.4)
Chloride: 103 mmol/L (ref 98–110)
Creat: 0.87 mg/dL (ref 0.50–1.05)
GFR, Est African American: >89 mL/min (ref >=60)
GFR, Est Non African American: 78 mL/min (ref >=60)
GLOBULIN: 2.4 g/dL (ref 1.9–3.7)
GLUCOSE: 91 mg/dL (ref 65–99)
POTASSIUM: 4.3 mmol/L (ref 3.5–5.3)
SODIUM: 139 mmol/L (ref 135–146)
TOTAL PROTEIN: 6.8 g/dL (ref 6.1–8.1)

## 2017-03-06 LAB — TSH: TSH: 0.56 m[IU]/L

## 2017-03-08 DIAGNOSIS — R6883 Chills (without fever): Secondary | ICD-10-CM | POA: Insufficient documentation

## 2017-03-08 DIAGNOSIS — R0789 Other chest pain: Secondary | ICD-10-CM | POA: Insufficient documentation

## 2017-03-08 DIAGNOSIS — G35 Multiple sclerosis: Secondary | ICD-10-CM | POA: Diagnosis not present

## 2017-03-08 DIAGNOSIS — R002 Palpitations: Secondary | ICD-10-CM | POA: Insufficient documentation

## 2017-03-08 DIAGNOSIS — R000 Tachycardia, unspecified: Secondary | ICD-10-CM | POA: Insufficient documentation

## 2017-03-08 NOTE — Progress Notes (Signed)
Call pt: TSH normal. Cbc normal. No sign of infection. Kidney look great.

## 2017-03-09 ENCOUNTER — Other Ambulatory Visit: Payer: Self-pay | Admitting: Physician Assistant

## 2017-03-09 ENCOUNTER — Ambulatory Visit (INDEPENDENT_AMBULATORY_CARE_PROVIDER_SITE_OTHER): Payer: Medicare Other

## 2017-03-09 DIAGNOSIS — R0789 Other chest pain: Secondary | ICD-10-CM | POA: Diagnosis not present

## 2017-03-09 DIAGNOSIS — Z01818 Encounter for other preprocedural examination: Secondary | ICD-10-CM

## 2017-03-09 DIAGNOSIS — R000 Tachycardia, unspecified: Secondary | ICD-10-CM

## 2017-03-09 DIAGNOSIS — R002 Palpitations: Secondary | ICD-10-CM

## 2017-03-09 LAB — EXERCISE TOLERANCE TEST
CHL CUP MPHR: 170 {beats}/min
CHL CUP RESTING HR STRESS: 97 {beats}/min
CHL CUP STRESS STAGE 1 HR: 105 {beats}/min
CHL CUP STRESS STAGE 1 SBP: 126 mmHg
CHL CUP STRESS STAGE 2 GRADE: 0 %
CHL CUP STRESS STAGE 3 GRADE: 0.1 %
CHL CUP STRESS STAGE 4 SPEED: 1.7 mph
CHL CUP STRESS STAGE 5 GRADE: 12 %
CHL CUP STRESS STAGE 5 HR: 136 {beats}/min
CHL CUP STRESS STAGE 5 SPEED: 2.5 mph
CHL CUP STRESS STAGE 6 DBP: 66 mmHg
CHL CUP STRESS STAGE 6 GRADE: 14 %
CHL CUP STRESS STAGE 6 SBP: 174 mmHg
CHL CUP STRESS STAGE 6 SPEED: 3.4 mph
CHL CUP STRESS STAGE 7 GRADE: 14 %
CHL CUP STRESS STAGE 7 HR: 148 {beats}/min
CHL CUP STRESS STAGE 8 DBP: 68 mmHg
CHL CUP STRESS STAGE 8 GRADE: 0 %
CHL CUP STRESS STAGE 9 DBP: 70 mmHg
CHL RATE OF PERCEIVED EXERTION: 15
CSEPEW: 10.1 METS
Exercise duration (min): 9 min
Exercise duration (sec): 0 s
Peak HR: 148 {beats}/min
Percent HR: 87 %
Percent of predicted max HR: 87 %
Stage 1 DBP: 72 mmHg
Stage 1 Grade: 0 %
Stage 1 Speed: 0 mph
Stage 2 HR: 111 {beats}/min
Stage 2 Speed: 1 mph
Stage 3 HR: 111 {beats}/min
Stage 3 Speed: 1 mph
Stage 4 DBP: 64 mmHg
Stage 4 Grade: 10 %
Stage 4 HR: 123 {beats}/min
Stage 4 SBP: 150 mmHg
Stage 5 DBP: 65 mmHg
Stage 5 SBP: 137 mmHg
Stage 6 HR: 148 {beats}/min
Stage 7 Speed: 3.4 mph
Stage 8 HR: 137 {beats}/min
Stage 8 SBP: 157 mmHg
Stage 8 Speed: 0 mph
Stage 9 Grade: 0 %
Stage 9 HR: 109 {beats}/min
Stage 9 SBP: 125 mmHg
Stage 9 Speed: 0 mph

## 2017-03-09 NOTE — Progress Notes (Signed)
Call pt: STress test is normal. Great news.

## 2017-03-10 NOTE — Addendum Note (Signed)
Addended by: Huel Cote on: 03/10/2017 04:20 PM   Modules accepted: Orders

## 2017-03-11 DIAGNOSIS — M1812 Unilateral primary osteoarthritis of first carpometacarpal joint, left hand: Secondary | ICD-10-CM | POA: Diagnosis not present

## 2017-03-11 DIAGNOSIS — Z981 Arthrodesis status: Secondary | ICD-10-CM | POA: Diagnosis not present

## 2017-03-11 DIAGNOSIS — G8918 Other acute postprocedural pain: Secondary | ICD-10-CM | POA: Diagnosis not present

## 2017-03-11 DIAGNOSIS — M960 Pseudarthrosis after fusion or arthrodesis: Secondary | ICD-10-CM | POA: Diagnosis not present

## 2017-03-11 DIAGNOSIS — M19042 Primary osteoarthritis, left hand: Secondary | ICD-10-CM | POA: Diagnosis not present

## 2017-03-11 DIAGNOSIS — T8489XD Other specified complication of internal orthopedic prosthetic devices, implants and grafts, subsequent encounter: Secondary | ICD-10-CM | POA: Diagnosis not present

## 2017-03-11 DIAGNOSIS — M19049 Primary osteoarthritis, unspecified hand: Secondary | ICD-10-CM | POA: Diagnosis not present

## 2017-03-11 DIAGNOSIS — T84220A Displacement of internal fixation device of bones of hand and fingers, initial encounter: Secondary | ICD-10-CM | POA: Diagnosis not present

## 2017-03-12 ENCOUNTER — Encounter: Payer: Self-pay | Admitting: Physician Assistant

## 2017-03-12 DIAGNOSIS — M960 Pseudarthrosis after fusion or arthrodesis: Secondary | ICD-10-CM | POA: Diagnosis not present

## 2017-03-12 DIAGNOSIS — T8489XD Other specified complication of internal orthopedic prosthetic devices, implants and grafts, subsequent encounter: Secondary | ICD-10-CM | POA: Diagnosis not present

## 2017-03-12 DIAGNOSIS — M1812 Unilateral primary osteoarthritis of first carpometacarpal joint, left hand: Secondary | ICD-10-CM | POA: Diagnosis not present

## 2017-03-12 DIAGNOSIS — I491 Atrial premature depolarization: Secondary | ICD-10-CM | POA: Insufficient documentation

## 2017-03-12 DIAGNOSIS — R000 Tachycardia, unspecified: Secondary | ICD-10-CM | POA: Insufficient documentation

## 2017-03-12 NOTE — Progress Notes (Signed)
Call pt: you were in an increased heart rate state 30 percent of monitoring time.  No arrthymias noted. You had a few PvC.  Suggestion is good hydration and increasing exercise level. We could consider a beta blocker if pulse continues to run elevated and experiencing more PACs.

## 2017-03-17 ENCOUNTER — Ambulatory Visit (INDEPENDENT_AMBULATORY_CARE_PROVIDER_SITE_OTHER): Payer: Medicare Other | Admitting: Psychiatry

## 2017-03-17 ENCOUNTER — Telehealth: Payer: Self-pay | Admitting: *Deleted

## 2017-03-17 ENCOUNTER — Encounter (HOSPITAL_COMMUNITY): Payer: Self-pay | Admitting: Psychiatry

## 2017-03-17 DIAGNOSIS — F3181 Bipolar II disorder: Secondary | ICD-10-CM | POA: Diagnosis not present

## 2017-03-17 DIAGNOSIS — R002 Palpitations: Secondary | ICD-10-CM

## 2017-03-17 DIAGNOSIS — M949 Disorder of cartilage, unspecified: Secondary | ICD-10-CM | POA: Diagnosis not present

## 2017-03-17 DIAGNOSIS — M899 Disorder of bone, unspecified: Secondary | ICD-10-CM | POA: Diagnosis not present

## 2017-03-17 DIAGNOSIS — G35 Multiple sclerosis: Secondary | ICD-10-CM

## 2017-03-17 DIAGNOSIS — R52 Pain, unspecified: Secondary | ICD-10-CM | POA: Diagnosis not present

## 2017-03-17 DIAGNOSIS — Z79899 Other long term (current) drug therapy: Secondary | ICD-10-CM | POA: Diagnosis not present

## 2017-03-17 DIAGNOSIS — G4700 Insomnia, unspecified: Secondary | ICD-10-CM | POA: Diagnosis not present

## 2017-03-17 DIAGNOSIS — M1990 Unspecified osteoarthritis, unspecified site: Secondary | ICD-10-CM | POA: Diagnosis not present

## 2017-03-17 DIAGNOSIS — F17210 Nicotine dependence, cigarettes, uncomplicated: Secondary | ICD-10-CM

## 2017-03-17 DIAGNOSIS — F4310 Post-traumatic stress disorder, unspecified: Secondary | ICD-10-CM | POA: Diagnosis not present

## 2017-03-17 DIAGNOSIS — I491 Atrial premature depolarization: Secondary | ICD-10-CM | POA: Diagnosis not present

## 2017-03-17 DIAGNOSIS — M797 Fibromyalgia: Secondary | ICD-10-CM | POA: Diagnosis not present

## 2017-03-17 DIAGNOSIS — R5382 Chronic fatigue, unspecified: Secondary | ICD-10-CM

## 2017-03-17 DIAGNOSIS — F411 Generalized anxiety disorder: Secondary | ICD-10-CM | POA: Diagnosis not present

## 2017-03-17 DIAGNOSIS — R6889 Other general symptoms and signs: Secondary | ICD-10-CM | POA: Diagnosis not present

## 2017-03-17 MED ORDER — OLANZAPINE 2.5 MG PO TABS: 2.5000 mg | ORAL_TABLET | Freq: Every day | ORAL | 1 refills | Status: DC

## 2017-03-17 NOTE — Progress Notes (Signed)
[facility] Outpatient Follow up visit   Patient Identification: Paige Lozano MRN:  [id number] Date of Evaluation:  03/17/2017 Referral Source: Luvenia Starch Primary care Chief Complaint:   Chief Complaint    Follow-up     Visit Diagnosis:    ICD-9-CM ICD-10-CM   1. Bipolar 2 disorder (HCC) 296.89 F31.81   2. PTSD (post-traumatic stress disorder) 309.81 F43.10   3. GAD (generalized anxiety disorder) 300.02 F41.1   4. Fibromyalgia 729.1 M79.7     History of Present Illness:  51 years old Returns for follow-up and medication management  She suffers from pain, osteoarthritis, MS and recent wrist injury. It effects her tiredness.   Recent surgery of hand and has had bone graft from hip. Using a cane . Difficult to ambulate that has effected mood.  Olanzapine helps at night. Also takes ambien at night .  Anxiety: manaageble with meds  Aggravating factor: recent surgeries. Multiple medical conditions Modifying factor: dogs.   Aggravating factor: friend in [place] and has cancer. Patient suffering from multiple medical conditions including fibromyalgia, multiple sclerosis. Poor finances Modifying factors; her dog      Previous Psychotropic Medications: Yes   Substance Abuse History in the last 12 months:  Yes.   as per history Marijuana says it is medical for her condition.  Consequences of Substance Abuse: Medical Consequences:  fatigue, poor concenctration  Past Medical History:  Past Medical History:  Diagnosis Date  . Anxiety   . Bipolar 2 disorder, major depressive episode ([place])   . Complication of anesthesia   . COPD (chronic obstructive pulmonary disease) ([place])    smoker  . Fibromyalgia   . Insomnia   . Multiple sclerosis ([place])   . PONV (postoperative nausea and vomiting)   . Pulmonary embolism ([place]) 2000  . Sleep apnea    mild OSA no CPAP  . Ulcerative colitis [facility])     Past Surgical History:  Procedure Laterality Date  . FOOT SURGERY Left    bone spur  . HEMORRHOID  SURGERY    . HUMERUS FRACTURE SURGERY Left   . MYOMECTOMY    . NASAL SINUS SURGERY    . SHOULDER ARTHROSCOPY WITH SUBACROMIAL DECOMPRESSION AND BICEP TENDON REPAIR Left 07/27/2016   Procedure: SHOULDER ARTHROSCOPY DEBRIDEMENT ROTATOR CUFF AND LABRUM, SUBACROMIAL DECOMPRESSION, BICEPS TENODESIS;  Surgeon: Tania Ade, MD;  Location: [place];  Service: Orthopedics;  Laterality: Left;  SHOULDER ARTHROSCOPY DEBRIDEMENT ROTATOR CUFF AND LABRUM, SUBACROMIAL DECOMPRESSION, BICEPS TENOTOMY, POSSIBLE TENODESIS  . THUMB ARTHROSCOPY     5 surgeries on left thumb, 4 surgeries on right  . TONSILLECTOMY AND ADENOIDECTOMY       Family History:  Family History  Problem Relation Age of Onset  . Adopted: Yes    Social History:   Social History   Social History  . Marital status: Divorced    Spouse name: N/A  . Number of children: N/A  . Years of education: N/A   Social History Main Topics  . Smoking status: Current Every Day Smoker    Last attempt to quit: 07/08/2016  . Smokeless tobacco: Never Used     Comment: currently taking Chantix  . Alcohol use 0.0 oz/week     Comment: rare   . Drug use: No     Comment: last use 3 month ago  . Sexual activity: Yes    Birth control/ protection: None   Other Topics Concern  . None   Social History Narrative  . None  Allergies:   Allergies  Allergen Reactions  . Ketamine Anaphylaxis  . Oxycodone Diarrhea  . Papaya Enzyme Anaphylaxis    Metabolic Disorder Labs: No results found for: HGBA1C, MPG No results found for: PROLACTIN No results found for: CHOL, TRIG, HDL, CHOLHDL, VLDL, LDLCALC   Current Medications: Current Outpatient Prescriptions  Medication Sig Dispense Refill  . albuterol (PROAIR HFA) 108 (90 Base) MCG/ACT inhaler Inhale 2 puffs into the lungs every 6 (six) hours as needed.    . ALPRAZolam (XANAX) 0.5 MG tablet take 1 tablet by mouth twice a day if needed for anxiety 45 tablet 0  . balsalazide  (COLAZAL) 750 MG capsule Take 2,250 mg by mouth 3 (three) times daily.    . Fingolimod HCl (GILENYA) 0.5 MG CAPS Take 0.5 capsules by mouth daily.    . fluticasone (FLONASE) 50 MCG/ACT nasal spray Place 2 sprays into both nostrils daily. 16 g 3  . hydrOXYzine (ATARAX/VISTARIL) 25 MG tablet Take 1 tablet (25 mg total) by mouth 3 (three) times daily as needed for itching. 60 tablet 0  . meloxicam (MOBIC) 15 MG tablet take 1 tablet by mouth once daily 30 tablet 2  . OLANZapine (ZYPREXA) 2.5 MG tablet Take 1 tablet (2.5 mg total) by mouth daily. 30 tablet 1  . varenicline (CHANTIX CONTINUING MONTH PAK) 1 MG tablet Take 1 tablet (1 mg total) by mouth 2 (two) times daily. 60 tablet 5  . zolpidem (AMBIEN) 10 MG tablet take 1 tablet by mouth at bedtime if needed for sleep 30 tablet 1   No current facility-administered medications for this visit.       Psychiatric Specialty Exam: Review of Systems  Cardiovascular: Negative for palpitations.  Musculoskeletal: Positive for joint pain and myalgias.  Skin: Negative for rash.  Neurological: Negative for tremors and headaches.  Psychiatric/Behavioral: Negative for suicidal ideas.    There were no vitals taken for this visit.There is no height or weight on file to calculate BMI.  General Appearance: Casual  Eye Contact:  Fair  Speech:  Normal Rate  Volume:  Increased  Mood: somewhat stressed due to recent surgery  Affect:  reactive  Thought Process:  Goal Directed  Orientation:  Full (Time, Place, and Person)  Thought Content:  Logical  Suicidal Thoughts:  No  Homicidal Thoughts:  No  Memory:  Immediate;   Fair Recent;   Fair  Judgement:  Fair  Insight:  Fair  Psychomotor Activity:  Normal  Concentration:  Concentration: Fair and Attention Span: Fair  Recall:  [employer] of Knowledge:Fair  Language: Fair  Akathisia:  Negative  Handed:  Right  AIMS (if indicated):    Assets:  Desire for Improvement  ADL's:  Intact  Cognition: WNL   Sleep:  Fair while on meds    Treatment Plan Summary: Medication management and Plan as follows  Bipolar 2: baseline. Wants to continue low dose olanzapine. Will refill GAD: worse when in pain. Takes klonopine  Insomnia: pain effects sleep. Reviewed sleep hygiene. Continue ambien Or trazadone Provided supportive therapy discussed various side effects follow up in 2-3 months she will also continue therapy with her counselor that does help her to work on Radiographer, therapeutic.     Merian Capron, MD 5/9/20181:49 PM

## 2017-03-17 NOTE — Telephone Encounter (Signed)
Fatigue panel ordered & faxed to lab per pt request.

## 2017-03-18 LAB — COMPREHENSIVE METABOLIC PANEL
ALK PHOS: 86 U/L (ref 33–130)
ALT: 15 U/L (ref 6–29)
AST: 24 U/L (ref 10–35)
Albumin: 4.1 g/dL (ref 3.6–5.1)
BUN: 14 mg/dL (ref 7–25)
CO2: 20 mmol/L (ref 20–31)
Calcium: 9.4 mg/dL (ref 8.6–10.4)
Chloride: 104 mmol/L (ref 98–110)
Creat: 0.78 mg/dL (ref 0.50–1.05)
Glucose, Bld: 96 mg/dL (ref 65–99)
Potassium: 4.7 mmol/L (ref 3.5–5.3)
SODIUM: 137 mmol/L (ref 135–146)
Total Bilirubin: 0.4 mg/dL (ref 0.2–1.2)
Total Protein: 6.6 g/dL (ref 6.1–8.1)

## 2017-03-18 LAB — CBC
HCT: 41.1 % (ref 35.0–45.0)
Hemoglobin: 13.8 g/dL (ref 11.7–15.5)
MCH: 29 pg (ref 27.0–33.0)
MCHC: 33.6 g/dL (ref 32.0–36.0)
MCV: 86.3 fL (ref 80.0–100.0)
MPV: 10.1 fL (ref 7.5–12.5)
PLATELETS: 310 10*3/uL (ref 140–400)
RBC: 4.76 MIL/uL (ref 3.80–5.10)
RDW: 14.8 % (ref 11.0–15.0)
WBC: 8.2 10*3/uL (ref 3.8–10.8)

## 2017-03-18 LAB — TSH: TSH: 0.67 m[IU]/L

## 2017-03-18 LAB — FERRITIN: Ferritin: 91 ng/mL (ref 10–232)

## 2017-03-18 LAB — VITAMIN B12: Vitamin B-12: 824 pg/mL (ref 200–1100)

## 2017-03-18 LAB — VITAMIN D 25 HYDROXY (VIT D DEFICIENCY, FRACTURES): Vit D, 25-Hydroxy: 52 ng/mL (ref 30–100)

## 2017-03-18 LAB — C-REACTIVE PROTEIN: CRP: 9.7 mg/L — AB (ref <8.0)

## 2017-03-18 LAB — SEDIMENTATION RATE: Sed Rate: 1 mm/hr (ref 0–20)

## 2017-03-25 DIAGNOSIS — G5601 Carpal tunnel syndrome, right upper limb: Secondary | ICD-10-CM | POA: Diagnosis not present

## 2017-03-25 DIAGNOSIS — S62235D Other nondisplaced fracture of base of first metacarpal bone, left hand, subsequent encounter for fracture with routine healing: Secondary | ICD-10-CM | POA: Diagnosis not present

## 2017-03-25 DIAGNOSIS — M7711 Lateral epicondylitis, right elbow: Secondary | ICD-10-CM | POA: Diagnosis not present

## 2017-03-25 DIAGNOSIS — M9689 Other intraoperative and postprocedural complications and disorders of the musculoskeletal system: Secondary | ICD-10-CM | POA: Diagnosis not present

## 2017-03-29 DIAGNOSIS — M47896 Other spondylosis, lumbar region: Secondary | ICD-10-CM | POA: Diagnosis not present

## 2017-03-29 DIAGNOSIS — M48061 Spinal stenosis, lumbar region without neurogenic claudication: Secondary | ICD-10-CM | POA: Diagnosis not present

## 2017-03-29 DIAGNOSIS — M4316 Spondylolisthesis, lumbar region: Secondary | ICD-10-CM | POA: Diagnosis not present

## 2017-03-29 DIAGNOSIS — M5126 Other intervertebral disc displacement, lumbar region: Secondary | ICD-10-CM | POA: Diagnosis not present

## 2017-03-29 DIAGNOSIS — M5136 Other intervertebral disc degeneration, lumbar region: Secondary | ICD-10-CM | POA: Diagnosis not present

## 2017-03-29 DIAGNOSIS — G35 Multiple sclerosis: Secondary | ICD-10-CM | POA: Diagnosis not present

## 2017-03-31 DIAGNOSIS — G5601 Carpal tunnel syndrome, right upper limb: Secondary | ICD-10-CM | POA: Diagnosis not present

## 2017-03-31 DIAGNOSIS — S62235D Other nondisplaced fracture of base of first metacarpal bone, left hand, subsequent encounter for fracture with routine healing: Secondary | ICD-10-CM | POA: Diagnosis not present

## 2017-03-31 DIAGNOSIS — M25552 Pain in left hip: Secondary | ICD-10-CM | POA: Diagnosis not present

## 2017-04-07 DIAGNOSIS — G35 Multiple sclerosis: Secondary | ICD-10-CM | POA: Diagnosis not present

## 2017-04-09 ENCOUNTER — Other Ambulatory Visit: Payer: Self-pay | Admitting: Physician Assistant

## 2017-04-09 ENCOUNTER — Ambulatory Visit (HOSPITAL_COMMUNITY): Payer: Self-pay | Admitting: Licensed Clinical Social Worker

## 2017-04-12 ENCOUNTER — Telehealth: Payer: Self-pay

## 2017-04-12 NOTE — Telephone Encounter (Signed)
Refill sent.

## 2017-04-12 NOTE — Telephone Encounter (Signed)
Pt is requesting a refill on ambien?  Please advise

## 2017-04-13 NOTE — Telephone Encounter (Signed)
Left message advising of the refill.

## 2017-04-14 ENCOUNTER — Other Ambulatory Visit: Payer: Self-pay | Admitting: Physician Assistant

## 2017-04-14 ENCOUNTER — Other Ambulatory Visit (HOSPITAL_COMMUNITY): Payer: Self-pay | Admitting: Psychiatry

## 2017-04-16 ENCOUNTER — Other Ambulatory Visit: Payer: Self-pay

## 2017-04-16 MED ORDER — ZOLPIDEM TARTRATE 10 MG PO TABS: 10.0000 mg | ORAL_TABLET | Freq: Every evening | ORAL | 1 refills | Status: DC | PRN

## 2017-04-19 NOTE — Telephone Encounter (Signed)
Received fax from [facility] requesting a 90 day supply for Zyprexa. Per Dr. De Nurse, refill request is denied. Refill was sent to pharmacy on 03/17/17 w/ 1 refill. Per provider 90 day supply will be discussed at the next visit. Nothing further is need at this time.

## 2017-04-21 DIAGNOSIS — S62235D Other nondisplaced fracture of base of first metacarpal bone, left hand, subsequent encounter for fracture with routine healing: Secondary | ICD-10-CM | POA: Diagnosis not present

## 2017-04-21 DIAGNOSIS — M9689 Other intraoperative and postprocedural complications and disorders of the musculoskeletal system: Secondary | ICD-10-CM | POA: Diagnosis not present

## 2017-04-21 DIAGNOSIS — M1810 Unilateral primary osteoarthritis of first carpometacarpal joint, unspecified hand: Secondary | ICD-10-CM | POA: Diagnosis not present

## 2017-04-30 DIAGNOSIS — S62235D Other nondisplaced fracture of base of first metacarpal bone, left hand, subsequent encounter for fracture with routine healing: Secondary | ICD-10-CM | POA: Diagnosis not present

## 2017-04-30 DIAGNOSIS — M1812 Unilateral primary osteoarthritis of first carpometacarpal joint, left hand: Secondary | ICD-10-CM | POA: Diagnosis not present

## 2017-04-30 DIAGNOSIS — M79642 Pain in left hand: Secondary | ICD-10-CM | POA: Diagnosis not present

## 2017-05-05 DIAGNOSIS — G35 Multiple sclerosis: Secondary | ICD-10-CM | POA: Diagnosis not present

## 2017-05-17 ENCOUNTER — Telehealth (HOSPITAL_COMMUNITY): Payer: Self-pay | Admitting: *Deleted

## 2017-05-17 MED ORDER — OLANZAPINE 2.5 MG PO TABS: 2.5000 mg | ORAL_TABLET | Freq: Every day | ORAL | 0 refills | Status: DC

## 2017-05-17 NOTE — Telephone Encounter (Signed)
Medication refill- received fax from [facility] requesting a refill for olanzapine. Per Dr. De Nurse, refill is authorize for Olanzapine 2.5mg , #30. Rx was sent to pharmacy. Called and informed pt of refill status. Pt's next apt is schedule on 06/17/17. Pt verbalizes understanding.

## 2017-05-20 ENCOUNTER — Other Ambulatory Visit: Payer: Self-pay | Admitting: *Deleted

## 2017-05-20 MED ORDER — ALPRAZOLAM 0.5 MG PO TABS: 0.5000 mg | ORAL_TABLET | Freq: Two times a day (BID) | ORAL | 0 refills | Status: DC | PRN

## 2017-06-02 ENCOUNTER — Ambulatory Visit (INDEPENDENT_AMBULATORY_CARE_PROVIDER_SITE_OTHER): Payer: Medicare Other | Admitting: Sports Medicine

## 2017-06-02 ENCOUNTER — Encounter: Payer: Self-pay | Admitting: Sports Medicine

## 2017-06-02 ENCOUNTER — Ambulatory Visit: Payer: Self-pay | Admitting: Family Medicine

## 2017-06-02 DIAGNOSIS — M5136 Other intervertebral disc degeneration, lumbar region: Secondary | ICD-10-CM

## 2017-06-02 DIAGNOSIS — Z7951 Long term (current) use of inhaled steroids: Secondary | ICD-10-CM | POA: Diagnosis not present

## 2017-06-02 DIAGNOSIS — M51369 Other intervertebral disc degeneration, lumbar region without mention of lumbar back pain or lower extremity pain: Secondary | ICD-10-CM | POA: Insufficient documentation

## 2017-06-02 DIAGNOSIS — M19049 Primary osteoarthritis, unspecified hand: Secondary | ICD-10-CM | POA: Diagnosis not present

## 2017-06-02 DIAGNOSIS — Z884 Allergy status to anesthetic agent status: Secondary | ICD-10-CM | POA: Diagnosis not present

## 2017-06-02 DIAGNOSIS — G35 Multiple sclerosis: Secondary | ICD-10-CM | POA: Diagnosis not present

## 2017-06-02 DIAGNOSIS — S62235D Other nondisplaced fracture of base of first metacarpal bone, left hand, subsequent encounter for fracture with routine healing: Secondary | ICD-10-CM | POA: Diagnosis not present

## 2017-06-02 DIAGNOSIS — Z87891 Personal history of nicotine dependence: Secondary | ICD-10-CM | POA: Diagnosis not present

## 2017-06-02 DIAGNOSIS — Z981 Arthrodesis status: Secondary | ICD-10-CM | POA: Diagnosis not present

## 2017-06-02 DIAGNOSIS — J449 Chronic obstructive pulmonary disease, unspecified: Secondary | ICD-10-CM | POA: Diagnosis not present

## 2017-06-02 DIAGNOSIS — M9689 Other intraoperative and postprocedural complications and disorders of the musculoskeletal system: Secondary | ICD-10-CM | POA: Diagnosis not present

## 2017-06-02 DIAGNOSIS — Z79899 Other long term (current) drug therapy: Secondary | ICD-10-CM | POA: Diagnosis not present

## 2017-06-02 MED ORDER — IBUPROFEN 800 MG PO TABS: 800.0000 mg | ORAL_TABLET | Freq: Three times a day (TID) | ORAL | 2 refills | Status: DC | PRN

## 2017-06-02 MED ORDER — PREDNISONE 50 MG PO TABS: ORAL_TABLET | ORAL | 0 refills | Status: DC

## 2017-06-02 MED ORDER — CYCLOBENZAPRINE HCL 10 MG PO TABS: ORAL_TABLET | ORAL | 0 refills | Status: DC

## 2017-06-02 MED ORDER — ACETAMINOPHEN ER 650 MG PO TBCR: 650.0000 mg | EXTENDED_RELEASE_TABLET | Freq: Three times a day (TID) | ORAL | 3 refills | Status: DC | PRN

## 2017-06-02 NOTE — Progress Notes (Signed)
   Subjective:    I'm seeing this patient as a consultation for:  Iran Planas, PA-C  CC: Low back pain  HPI: This is a 51 year old female, she has a long history of low back pain, currently seen at a pain management clinic. She is at epidurals over a ago, unable to tell me well levels were injected, or any other details. She did have some relief. She is having a great deal of pain after helping a friend move, and is unable to get in with her pain provider. Pain is axial, worse with sitting, flexion, Valsalva, occasional radiation with paresthesias into the posterior lateral ankles but not to the feet. No bowel or bladder dysfunction, saddle numbness, no constitutional symptoms.  Past medical history, Surgical history, Family history not pertinant except as noted below, Social history, Allergies, and medications have been entered into the medical record, reviewed, and no changes needed.   Review of Systems: No headache, visual changes, nausea, vomiting, diarrhea, constipation, dizziness, abdominal pain, skin rash, fevers, chills, night sweats, weight loss, swollen lymph nodes, body aches, joint swelling, muscle aches, chest pain, shortness of breath, mood changes, visual or auditory hallucinations.   Objective:   General: Well Developed, well nourished, and in no acute distress.  Neuro:  Extra-ocular muscles intact, able to move all 4 extremities, sensation grossly intact.  Deep tendon reflexes tested were normal. Psych: Alert and oriented, mood congruent with affect. ENT:  Ears and nose appear unremarkable.  Hearing grossly normal. Neck: Unremarkable overall appearance, trachea midline.  No visible thyroid enlargement. Eyes: Conjunctivae and lids appear unremarkable.  Pupils equal and round. Skin: Warm and dry, no rashes noted.  Cardiovascular: Pulses palpable, no extremity edema. Back Exam:  Inspection: Unremarkable  Motion: Flexion 45 deg, Extension 45 deg, Side Bending to 45 deg  bilaterally,  Rotation to 45 deg bilaterally  SLR laying: Negative  XSLR laying: Negative  Palpable tenderness: None. FABER: negative. Sensory change: Gross sensation intact to all lumbar and sacral dermatomes.  Reflexes: 2+ at both patellar tendons, 2+ at achilles tendons, Babinski's downgoing.  Strength at foot  Plantar-flexion: 5/5 Dorsi-flexion: 5/5 Eversion: 5/5 Inversion: 5/5  Leg strength  Quad: 5/5 Hamstring: 5/5 Hip flexor: 5/5 Hip abductors: 5/5  Gait unremarkable.  Impression and Recommendations:   This case required medical decision making of moderate complexity.  Lumbar degenerative disc disease Multilevel degenerative disc disease with L5 versus S1 radicular symptoms. Prednisone, Flexeril, high-dose ibuprofen, arthritis strength Tylenol. She is established with Dr. Selinda Orion however hasn't unable to get in with her yet, I have advised her to get her MRI disc to that we can go over it and plan an epidural if needed, I also advised her that too many cooks in the kitchen are also not  good idea.

## 2017-06-02 NOTE — Assessment & Plan Note (Signed)
Multilevel degenerative disc disease with L5 versus S1 radicular symptoms. Prednisone, Flexeril, high-dose ibuprofen, arthritis strength Tylenol. She is established with Dr. Selinda Orion however hasn't unable to get in with her yet, I have advised her to get her MRI disc to that we can go over it and plan an epidural if needed, I also advised her that too many cooks in the kitchen are also not  good idea.

## 2017-06-15 ENCOUNTER — Other Ambulatory Visit (HOSPITAL_COMMUNITY): Payer: Self-pay | Admitting: Psychiatry

## 2017-06-15 ENCOUNTER — Other Ambulatory Visit: Payer: Self-pay | Admitting: Sports Medicine

## 2017-06-15 DIAGNOSIS — M5136 Other intervertebral disc degeneration, lumbar region: Secondary | ICD-10-CM

## 2017-06-16 ENCOUNTER — Ambulatory Visit: Payer: Self-pay | Admitting: Sports Medicine

## 2017-06-16 DIAGNOSIS — Z0189 Encounter for other specified special examinations: Secondary | ICD-10-CM

## 2017-06-17 ENCOUNTER — Encounter (HOSPITAL_COMMUNITY): Payer: Self-pay | Admitting: Psychiatry

## 2017-06-17 ENCOUNTER — Telehealth: Payer: Self-pay | Admitting: Sports Medicine

## 2017-06-17 ENCOUNTER — Ambulatory Visit (INDEPENDENT_AMBULATORY_CARE_PROVIDER_SITE_OTHER): Payer: Medicare Other | Admitting: Psychiatry

## 2017-06-17 DIAGNOSIS — M2550 Pain in unspecified joint: Secondary | ICD-10-CM

## 2017-06-17 DIAGNOSIS — F4310 Post-traumatic stress disorder, unspecified: Secondary | ICD-10-CM | POA: Diagnosis not present

## 2017-06-17 DIAGNOSIS — F411 Generalized anxiety disorder: Secondary | ICD-10-CM

## 2017-06-17 DIAGNOSIS — Z87891 Personal history of nicotine dependence: Secondary | ICD-10-CM

## 2017-06-17 DIAGNOSIS — M791 Myalgia: Secondary | ICD-10-CM

## 2017-06-17 DIAGNOSIS — F3181 Bipolar II disorder: Secondary | ICD-10-CM

## 2017-06-17 DIAGNOSIS — F5102 Adjustment insomnia: Secondary | ICD-10-CM | POA: Diagnosis not present

## 2017-06-17 MED ORDER — OLANZAPINE 2.5 MG PO TABS: 2.5000 mg | ORAL_TABLET | Freq: Every day | ORAL | 2 refills | Status: DC

## 2017-06-17 NOTE — Telephone Encounter (Signed)
Ok thanks I will let patient know.

## 2017-06-17 NOTE — Telephone Encounter (Signed)
Pt came in today thinking she had an appointment with you but she had the wrong day because her appointment was with you yesterday and she stated she is sorry she missed it. She also wanted to ask if she can go ahead and get a refill on her Flexeril. She has an appointment with Pain Management Dr. Luisa Hart later this month. Thanks

## 2017-06-17 NOTE — Telephone Encounter (Signed)
It was refilled 2 days ago [place].

## 2017-06-17 NOTE — Progress Notes (Signed)
[facility] Outpatient Follow up visit   Patient Identification: Paige Lozano MRN:  [id number] Date of Evaluation:  06/17/2017 Referral Source: Luvenia Starch Primary care Chief Complaint:   Chief Complaint    Follow-up     Visit Diagnosis:    ICD-10-CM   1. Bipolar 2 disorder (HCC) F31.81   2. PTSD (post-traumatic stress disorder) F43.10   3. GAD (generalized anxiety disorder) F41.1   4. Adjustment insomnia F51.02     History of Present Illness:  51 years old Returns for follow-up and medication management   Patient doing reasonable olanzapine mood swings-wise. Not hopeless or depressed she tried taking Ambien for sleep but that did not help she started exercising and that is helping along with Ambien. She has osteoarthritis multiple joint concerns including the left wrist and also past injury going through some possible more surgeries that affects her mood at times and sleep  Anxiety: manaageble with meds  Aggravating factor: recent surgeries . Multiple medical conditions Modifying factor:dogs   Aggravating factor: friend in [place] and has cancer. Patient suffering from multiple medical conditions including fibromyalgia, multiple sclerosis. Poor finances Modifying factors; her dog      Previous Psychotropic Medications: Yes   Substance Abuse History in the last 12 months:  Yes.   as per history Marijuana says it is medical for her condition.  Consequences of Substance Abuse: Medical Consequences:  fatigue, poor concenctration  Past Medical History:  Past Medical History:  Diagnosis Date  . Anxiety   . Bipolar 2 disorder, major depressive episode ([place])   . Complication of anesthesia   . COPD (chronic obstructive pulmonary disease) ([place])    smoker  . Fibromyalgia   . Insomnia   . Multiple sclerosis ([place])   . PONV (postoperative nausea and vomiting)   . Pulmonary embolism ([place]) 2000  . Sleep apnea    mild OSA no CPAP  . Ulcerative colitis [facility] [place])     Past Surgical History:   Procedure Laterality Date  . FOOT SURGERY Left    bone spur  . HEMORRHOID SURGERY    . HUMERUS FRACTURE SURGERY Left   . MYOMECTOMY    . NASAL SINUS SURGERY    . SHOULDER ARTHROSCOPY WITH SUBACROMIAL DECOMPRESSION AND BICEP TENDON REPAIR Left 07/27/2016   Procedure: SHOULDER ARTHROSCOPY DEBRIDEMENT ROTATOR CUFF AND LABRUM, SUBACROMIAL DECOMPRESSION, BICEPS TENODESIS;  Surgeon: Tania Ade, MD;  Location: [place];  Service: Orthopedics;  Laterality: Left;  SHOULDER ARTHROSCOPY DEBRIDEMENT ROTATOR CUFF AND LABRUM, SUBACROMIAL DECOMPRESSION, BICEPS TENOTOMY, POSSIBLE TENODESIS  . THUMB ARTHROSCOPY     5 surgeries on left thumb, 4 surgeries on right  . TONSILLECTOMY AND ADENOIDECTOMY       Family History:  Family History  Problem Relation Age of Onset  . Adopted: Yes    Social History:   Social History   Social History  . Marital status: Divorced    Spouse name: N/A  . Number of children: N/A  . Years of education: N/A   Social History Main Topics  . Smoking status: Former Smoker    Types: Cigarettes    Quit date: 07/08/2016  . Smokeless tobacco: Never Used  . Alcohol use 0.0 oz/week     Comment: rare   . Drug use: No     Comment: last use 3 month ago  . Sexual activity: Yes    Birth control/ protection: None   Other Topics Concern  . None   Social History Narrative  . None  Allergies:   Allergies  Allergen Reactions  . Ketamine Anaphylaxis  . Oxycodone Diarrhea  . Papaya Enzyme Anaphylaxis    Metabolic Disorder Labs: No results found for: HGBA1C, MPG No results found for: PROLACTIN No results found for: CHOL, TRIG, HDL, CHOLHDL, VLDL, LDLCALC   Current Medications: Current Outpatient Prescriptions  Medication Sig Dispense Refill  . acetaminophen (TYLENOL) 650 MG CR tablet Take 1 tablet (650 mg total) by mouth every 8 (eight) hours as needed for pain. 90 tablet 3  . albuterol (PROAIR HFA) 108 (90 Base) MCG/ACT inhaler Inhale 2  puffs into the lungs every 6 (six) hours as needed.    . ALPRAZolam (XANAX) 0.5 MG tablet Take 1 tablet (0.5 mg total) by mouth 2 (two) times daily as needed. for anxiety 45 tablet 0  . balsalazide (COLAZAL) 750 MG capsule Take 2,250 mg by mouth 3 (three) times daily.    . cyclobenzaprine (FLEXERIL) 10 MG tablet TAKE ONE-HALF TABLET BY MOUTH AT BEDTIME, THEN INCREASE GRADUALLY TO 1 TABLET THREE TIMES DAILY 30 tablet 0  . Fingolimod HCl (GILENYA) 0.5 MG CAPS Take 0.5 capsules by mouth daily.    . fluticasone (FLONASE) 50 MCG/ACT nasal spray Place 2 sprays into both nostrils daily. 16 g 3  . hydrOXYzine (ATARAX/VISTARIL) 25 MG tablet Take 1 tablet (25 mg total) by mouth 3 (three) times daily as needed for itching. 60 tablet 0  . ibuprofen (ADVIL,MOTRIN) 800 MG tablet Take 1 tablet (800 mg total) by mouth every 8 (eight) hours as needed. 90 tablet 2  . OLANZapine (ZYPREXA) 2.5 MG tablet Take 1 tablet (2.5 mg total) by mouth daily. 30 tablet 2  . predniSONE (DELTASONE) 50 MG tablet One tab PO daily for 5 days. 5 tablet 0  . zolpidem (AMBIEN) 10 MG tablet Take 1 tablet (10 mg total) by mouth at bedtime as needed for sleep. 30 tablet 1   No current facility-administered medications for this visit.       Psychiatric Specialty Exam: Review of Systems  Cardiovascular: Negative for chest pain.  Musculoskeletal: Positive for joint pain and myalgias.  Skin: Negative for rash.  Neurological: Negative for tremors and headaches.  Psychiatric/Behavioral: Negative for suicidal ideas.    There were no vitals taken for this visit.There is no height or weight on file to calculate BMI.  General Appearance: Casual  Eye Contact:  Fair  Speech:  Normal Rate  Volume:  normal  Mood: fair  Affect:  reactive  Thought Process:  Goal Directed  Orientation:  Full (Time, Place, and Person)  Thought Content:  Logical  Suicidal Thoughts:  No  Homicidal Thoughts:  No  Memory:  Immediate;   Fair Recent;   Fair   Judgement:  Fair  Insight:  Fair  Psychomotor Activity:  Normal  Concentration:  Concentration: Fair and Attention Span: Fair  Recall:  [employer] of Knowledge:Fair  Language: Fair  Akathisia:  Negative  Handed:  Right  AIMS (if indicated):    Assets:  Desire for Improvement  ADL's:  Intact  Cognition: WNL  Sleep:  Fair while on meds    Treatment Plan Summary: Medication management and Plan as follows  Bipolar 2:  Baseline. Will continue olanzapine 2.5mg  qhs GAD: fluctuates with pain level. On xanax from primary care. Takes prn  Insomnia: pain effects sleep. excercises are helping along with Lorrin Mais  Questions addressed follow-up in 3 months or earlier if needed prescription sent   Merian Capron, MD 8/9/201810:15 AM

## 2017-06-24 ENCOUNTER — Ambulatory Visit: Payer: Self-pay | Admitting: Physical Therapy

## 2017-06-29 ENCOUNTER — Other Ambulatory Visit: Payer: Self-pay | Admitting: Sports Medicine

## 2017-06-29 DIAGNOSIS — M5136 Other intervertebral disc degeneration, lumbar region: Secondary | ICD-10-CM

## 2017-07-01 ENCOUNTER — Other Ambulatory Visit: Payer: Self-pay | Admitting: Physician Assistant

## 2017-07-01 ENCOUNTER — Other Ambulatory Visit: Payer: Self-pay | Admitting: Sports Medicine

## 2017-07-01 DIAGNOSIS — G603 Idiopathic progressive neuropathy: Secondary | ICD-10-CM | POA: Diagnosis not present

## 2017-07-01 DIAGNOSIS — R2681 Unsteadiness on feet: Secondary | ICD-10-CM | POA: Diagnosis not present

## 2017-07-01 DIAGNOSIS — Z79899 Other long term (current) drug therapy: Secondary | ICD-10-CM | POA: Diagnosis not present

## 2017-07-01 DIAGNOSIS — R5383 Other fatigue: Secondary | ICD-10-CM | POA: Diagnosis not present

## 2017-07-01 DIAGNOSIS — G35 Multiple sclerosis: Secondary | ICD-10-CM | POA: Diagnosis not present

## 2017-07-02 ENCOUNTER — Other Ambulatory Visit: Payer: Self-pay | Admitting: Physician Assistant

## 2017-07-02 DIAGNOSIS — G35 Multiple sclerosis: Secondary | ICD-10-CM | POA: Diagnosis not present

## 2017-07-07 ENCOUNTER — Other Ambulatory Visit: Payer: Self-pay

## 2017-07-07 ENCOUNTER — Other Ambulatory Visit: Payer: Self-pay | Admitting: Physician Assistant

## 2017-07-07 MED ORDER — ZOLPIDEM TARTRATE 10 MG PO TABS: 10.0000 mg | ORAL_TABLET | Freq: Every evening | ORAL | 1 refills | Status: DC | PRN

## 2017-07-08 DIAGNOSIS — M5136 Other intervertebral disc degeneration, lumbar region: Secondary | ICD-10-CM | POA: Diagnosis not present

## 2017-07-08 DIAGNOSIS — G894 Chronic pain syndrome: Secondary | ICD-10-CM | POA: Diagnosis not present

## 2017-07-08 DIAGNOSIS — M47816 Spondylosis without myelopathy or radiculopathy, lumbar region: Secondary | ICD-10-CM | POA: Diagnosis not present

## 2017-07-08 DIAGNOSIS — M461 Sacroiliitis, not elsewhere classified: Secondary | ICD-10-CM | POA: Diagnosis not present

## 2017-07-13 DIAGNOSIS — M47816 Spondylosis without myelopathy or radiculopathy, lumbar region: Secondary | ICD-10-CM | POA: Diagnosis not present

## 2017-08-03 DIAGNOSIS — G35 Multiple sclerosis: Secondary | ICD-10-CM | POA: Diagnosis not present

## 2017-08-06 ENCOUNTER — Encounter: Payer: Self-pay | Admitting: Physician Assistant

## 2017-08-06 ENCOUNTER — Ambulatory Visit (INDEPENDENT_AMBULATORY_CARE_PROVIDER_SITE_OTHER): Payer: Medicare Other | Admitting: Physician Assistant

## 2017-08-06 VITALS — BP 111/73 | HR 96 | Temp 98.6°F | Wt 202.0 lb

## 2017-08-06 DIAGNOSIS — J0191 Acute recurrent sinusitis, unspecified: Secondary | ICD-10-CM | POA: Diagnosis not present

## 2017-08-06 MED ORDER — AMOXICILLIN-POT CLAVULANATE 875-125 MG PO TABS: 1.0000 | ORAL_TABLET | Freq: Two times a day (BID) | ORAL | 0 refills | Status: AC

## 2017-08-06 MED ORDER — IPRATROPIUM BROMIDE 0.06 % NA SOLN: 1.0000 | Freq: Four times a day (QID) | NASAL | 0 refills | Status: DC | PRN

## 2017-08-06 MED ORDER — FLUCONAZOLE 150 MG PO TABS: 150.0000 mg | ORAL_TABLET | Freq: Once | ORAL | 0 refills | Status: DC

## 2017-08-06 NOTE — Patient Instructions (Signed)
Follow-up with PCP when feeling better for influenza and pneumonia vaccines

## 2017-08-06 NOTE — Progress Notes (Signed)
HPI:                                                                Paige Lozano is a 51 y.o. female who presents to [place]: Primary Care Sports Medicine today for cough  This pleasant 51 yo F with PMH of COPD, MS, PE, OSA, chronic sinusitis s/p surgery and UC presents today with nasal congestion and cough x 10 days, not improving. Reports green nasal secretions, sinus pressure. Cough is productive of yellow mucus. Denies wheezing, shortness of breath, hemoptysis. Has tried Dayquil, Emergen-C and leftover antibiotics for a UTI. Patient is on monthly infusion of Natalizumab for MS.   Past Medical History:  Diagnosis Date  . Anxiety   . Bipolar 2 disorder, major depressive episode ([place])   . Complication of anesthesia   . COPD (chronic obstructive pulmonary disease) ([place])    smoker  . Fibromyalgia   . Insomnia   . Multiple sclerosis ([place])   . PONV (postoperative nausea and vomiting)   . Pulmonary embolism ([place]) 2000  . Sleep apnea    mild OSA no CPAP  . Ulcerative colitis [facility])    Past Surgical History:  Procedure Laterality Date  . FOOT SURGERY Left    bone spur  . HEMORRHOID SURGERY    . HUMERUS FRACTURE SURGERY Left   . MYOMECTOMY    . NASAL SINUS SURGERY    . SHOULDER ARTHROSCOPY WITH SUBACROMIAL DECOMPRESSION AND BICEP TENDON REPAIR Left 07/27/2016   Procedure: SHOULDER ARTHROSCOPY DEBRIDEMENT ROTATOR CUFF AND LABRUM, SUBACROMIAL DECOMPRESSION, BICEPS TENODESIS;  Surgeon: Tania Ade, MD;  Location: [place];  Service: Orthopedics;  Laterality: Left;  SHOULDER ARTHROSCOPY DEBRIDEMENT ROTATOR CUFF AND LABRUM, SUBACROMIAL DECOMPRESSION, BICEPS TENOTOMY, POSSIBLE TENODESIS  . THUMB ARTHROSCOPY     5 surgeries on left thumb, 4 surgeries on right  . TONSILLECTOMY AND ADENOIDECTOMY     Social History  Substance Use Topics  . Smoking status: Current Every Day Smoker    Types: Cigarettes    Last attempt to quit: 07/08/2016  .  Smokeless tobacco: Never Used  . Alcohol use 0.0 oz/week     Comment: rare    family history is not on file. She was adopted.  ROS: negative except as noted in the HPI  Medications: Current Outpatient Prescriptions  Medication Sig Dispense Refill  . acetaminophen (TYLENOL) 650 MG CR tablet Take 1 tablet (650 mg total) by mouth every 8 (eight) hours as needed for pain. 90 tablet 3  . albuterol (PROAIR HFA) 108 (90 Base) MCG/ACT inhaler Inhale 2 puffs into the lungs every 6 (six) hours as needed.    . ALPRAZolam (XANAX) 0.5 MG tablet TAKE 1 TABLET BY MOUTH TWICE DAILY AS NEEDED FOR ANXIETY 45 tablet 0  . amoxicillin-clavulanate (AUGMENTIN) 875-125 MG tablet Take 1 tablet by mouth 2 (two) times daily. 10 tablet 0  . balsalazide (COLAZAL) 750 MG capsule Take 2,250 mg by mouth 3 (three) times daily.    . cyclobenzaprine (FLEXERIL) 10 MG tablet TAKE 1/2 (ONE-HALF) TABLET BY MOUTH AT BEDTIME, THEN  INCREASE  GRADUALLY  TO  1  TABLET  3  TIMES  DAILY 30 tablet 0  . Fingolimod HCl (GILENYA) 0.5 MG CAPS Take 0.5 capsules by mouth  daily.    . fluconazole (DIFLUCAN) 150 MG tablet Take 1 tablet (150 mg total) by mouth once. 1 tablet 0  . fluticasone (FLONASE) 50 MCG/ACT nasal spray Place 2 sprays into both nostrils daily. 16 g 3  . hydrOXYzine (ATARAX/VISTARIL) 25 MG tablet Take 1 tablet (25 mg total) by mouth 3 (three) times daily as needed for itching. 60 tablet 0  . ibuprofen (ADVIL,MOTRIN) 800 MG tablet Take 1 tablet (800 mg total) by mouth every 8 (eight) hours as needed. 90 tablet 2  . ipratropium (ATROVENT) 0.06 % nasal spray Place 1 spray into both nostrils 4 (four) times daily as needed. 15 mL 0  . OLANZapine (ZYPREXA) 2.5 MG tablet Take 1 tablet (2.5 mg total) by mouth daily. 30 tablet 2  . zolpidem (AMBIEN) 10 MG tablet Take 1 tablet (10 mg total) by mouth at bedtime as needed. for sleep 30 tablet 1   No current facility-administered medications for this visit.    Allergies  Allergen  Reactions  . Ketamine Anaphylaxis  . Oxycodone Diarrhea  . Papaya Enzyme Anaphylaxis       Objective:  BP 111/73   Pulse 96   Temp 98.6 F (37 C) (Oral)   Wt 202 lb (91.6 kg)   SpO2 98%   BMI 30.71 kg/m  Gen: well-groomed, cooperative, not ill-appearing, no distress HEENT: normal conjunctiva, TM's clear b/l, nasal mucosa edematous, oropharynx clear, moist mucus membranes, diffuse frontal or maxillary sinus tenderness, neck supple, trachea midline Pulm: Normal work of breathing, normal phonation, clear to auscultation bilaterally, no wheezes, rales or rhonchi CV: Normal rate, regular rhythm, s1 and s2 distinct, no murmurs, clicks or rubs  GI: abdomen soft, nondistended, nontender Neuro: alert and oriented x 3, EOM's intact, no tremor MSK: extremities atraumatic, normal gait and station, no peripheral edema Lymph: no cervical or tonsillar adenopathy Skin: warm, dry, intact; no rashes on exposed skin, no cyanosis   No results found for this or any previous visit (from the past 72 hour(s)). No results found.    Assessment and Plan: 51 y.o. female with   1. Acute recurrent sinusitis, unspecified location - ipratropium (ATROVENT) 0.06 % nasal spray; Place 1 spray into both nostrils 4 (four) times daily as needed.  Dispense: 15 mL; Refill: 0 - amoxicillin-clavulanate (AUGMENTIN) 875-125 MG tablet; Take 1 tablet by mouth 2 (two) times daily.  Dispense: 10 tablet; Refill: 0 - recommended patient return when better for pneumonia and influenza vaccines  Patient education and anticipatory guidance given Patient agrees with treatment plan Follow-up as needed if symptoms worsen or fail to improve  Darlyne Russian PA-C

## 2017-08-09 DIAGNOSIS — S62235D Other nondisplaced fracture of base of first metacarpal bone, left hand, subsequent encounter for fracture with routine healing: Secondary | ICD-10-CM | POA: Diagnosis not present

## 2017-08-09 DIAGNOSIS — Z7951 Long term (current) use of inhaled steroids: Secondary | ICD-10-CM | POA: Diagnosis not present

## 2017-08-09 DIAGNOSIS — Z888 Allergy status to other drugs, medicaments and biological substances status: Secondary | ICD-10-CM | POA: Diagnosis not present

## 2017-08-09 DIAGNOSIS — Z9889 Other specified postprocedural states: Secondary | ICD-10-CM | POA: Diagnosis not present

## 2017-08-09 DIAGNOSIS — Z79899 Other long term (current) drug therapy: Secondary | ICD-10-CM | POA: Diagnosis not present

## 2017-08-09 DIAGNOSIS — M1812 Unilateral primary osteoarthritis of first carpometacarpal joint, left hand: Secondary | ICD-10-CM | POA: Diagnosis not present

## 2017-08-09 DIAGNOSIS — Z87891 Personal history of nicotine dependence: Secondary | ICD-10-CM | POA: Diagnosis not present

## 2017-08-09 DIAGNOSIS — J449 Chronic obstructive pulmonary disease, unspecified: Secondary | ICD-10-CM | POA: Diagnosis not present

## 2017-08-09 DIAGNOSIS — Z981 Arthrodesis status: Secondary | ICD-10-CM | POA: Diagnosis not present

## 2017-08-16 ENCOUNTER — Other Ambulatory Visit: Payer: Self-pay | Admitting: Physician Assistant

## 2017-09-02 DIAGNOSIS — M5136 Other intervertebral disc degeneration, lumbar region: Secondary | ICD-10-CM | POA: Diagnosis not present

## 2017-09-02 DIAGNOSIS — M47816 Spondylosis without myelopathy or radiculopathy, lumbar region: Secondary | ICD-10-CM | POA: Diagnosis not present

## 2017-09-03 DIAGNOSIS — G35 Multiple sclerosis: Secondary | ICD-10-CM | POA: Diagnosis not present

## 2017-09-08 ENCOUNTER — Encounter (HOSPITAL_COMMUNITY): Payer: Self-pay | Admitting: Psychiatry

## 2017-09-08 ENCOUNTER — Ambulatory Visit (INDEPENDENT_AMBULATORY_CARE_PROVIDER_SITE_OTHER): Payer: Medicare Other | Admitting: Psychiatry

## 2017-09-08 VITALS — BP 126/76 | HR 84 | Resp 16 | Ht 68.5 in | Wt 200.0 lb

## 2017-09-08 DIAGNOSIS — F1299 Cannabis use, unspecified with unspecified cannabis-induced disorder: Secondary | ICD-10-CM

## 2017-09-08 DIAGNOSIS — F17210 Nicotine dependence, cigarettes, uncomplicated: Secondary | ICD-10-CM | POA: Diagnosis not present

## 2017-09-08 DIAGNOSIS — F5102 Adjustment insomnia: Secondary | ICD-10-CM

## 2017-09-08 DIAGNOSIS — F3181 Bipolar II disorder: Secondary | ICD-10-CM

## 2017-09-08 DIAGNOSIS — M791 Myalgia, unspecified site: Secondary | ICD-10-CM

## 2017-09-08 DIAGNOSIS — F4310 Post-traumatic stress disorder, unspecified: Secondary | ICD-10-CM | POA: Diagnosis not present

## 2017-09-08 DIAGNOSIS — F411 Generalized anxiety disorder: Secondary | ICD-10-CM

## 2017-09-08 DIAGNOSIS — M2550 Pain in unspecified joint: Secondary | ICD-10-CM | POA: Diagnosis not present

## 2017-09-08 DIAGNOSIS — M797 Fibromyalgia: Secondary | ICD-10-CM | POA: Diagnosis not present

## 2017-09-08 MED ORDER — OLANZAPINE 2.5 MG PO TABS: 2.5000 mg | ORAL_TABLET | Freq: Every day | ORAL | 3 refills | Status: DC

## 2017-09-08 NOTE — Progress Notes (Signed)
[facility] Outpatient Follow up visit   Patient Identification: Paige Lozano MRN:  [id number] Date of Evaluation:  09/08/2017 Referral Source: [facility] Chief Complaint:   Chief Complaint    Follow-up     Visit Diagnosis:    ICD-10-CM   1. Bipolar 2 disorder (HCC) F31.81   2. PTSD (post-traumatic stress disorder) F43.10   3. GAD (generalized anxiety disorder) F41.1   4. Adjustment insomnia F51.02   5. Fibromyalgia M79.7     History of Present Illness:  51 years old Returns for follow-up and medication management  Patient feels stable. Has boyfriend. Olanzapine helps mood Going thru gym and keeping active For arthritis, Multiple sclerosis.    Anxiety: improved. On prn xanax  Aggravating factor: surgeries . Multiple medical conditions Modifying factor: dogs  Myalgia and pain still effects. New MS medication has helped fatigue      Previous Psychotropic Medications: Yes   Substance Abuse History in the last 12 months:  Yes.   as per history Marijuana says it is medical for her condition.  Consequences of Substance Abuse: Medical Consequences:  fatigue, poor concenctration  Past Medical History:  Past Medical History:  Diagnosis Date  . Anxiety   . Bipolar 2 disorder, major depressive episode ([place])   . Complication of anesthesia   . COPD (chronic obstructive pulmonary disease) ([place])    smoker  . Fibromyalgia   . Insomnia   . Multiple sclerosis ([place])   . PONV (postoperative nausea and vomiting)   . Pulmonary embolism ([place]) 2000  . Sleep apnea    mild OSA no CPAP  . Ulcerative colitis [facility])     Past Surgical History:  Procedure Laterality Date  . FOOT SURGERY Left    bone spur  . HEMORRHOID SURGERY    . HUMERUS FRACTURE SURGERY Left   . MYOMECTOMY    . NASAL SINUS SURGERY    . SHOULDER ARTHROSCOPY WITH SUBACROMIAL DECOMPRESSION AND BICEP TENDON REPAIR Left 07/27/2016   Procedure: SHOULDER ARTHROSCOPY DEBRIDEMENT ROTATOR CUFF AND LABRUM, SUBACROMIAL  DECOMPRESSION, BICEPS TENODESIS;  Surgeon: Tania Ade, MD;  Location: [place];  Service: Orthopedics;  Laterality: Left;  SHOULDER ARTHROSCOPY DEBRIDEMENT ROTATOR CUFF AND LABRUM, SUBACROMIAL DECOMPRESSION, BICEPS TENOTOMY, POSSIBLE TENODESIS  . THUMB ARTHROSCOPY     5 surgeries on left thumb, 4 surgeries on right  . TONSILLECTOMY AND ADENOIDECTOMY       Family History:  Family History  Problem Relation Age of Onset  . Adopted: Yes    Social History:   Social History   Social History  . Marital status: Divorced    Spouse name: N/A  . Number of children: N/A  . Years of education: N/A   Social History Main Topics  . Smoking status: Current Every Day Smoker    Packs/day: 0.25    Types: Cigarettes    Last attempt to quit: 07/08/2016  . Smokeless tobacco: Never Used  . Alcohol use 0.0 oz/week     Comment: rare   . Drug use: No     Comment: last use 5 month ago  . Sexual activity: Yes    Partners: Male    Birth control/ protection: None   Other Topics Concern  . None   Social History Narrative  . None     Allergies:   Allergies  Allergen Reactions  . Ketamine Anaphylaxis  . Oxycodone Diarrhea  . Papaya Enzyme Anaphylaxis    Metabolic Disorder Labs: No results found for: HGBA1C, MPG No results  found for: PROLACTIN No results found for: CHOL, TRIG, HDL, CHOLHDL, VLDL, LDLCALC   Current Medications: Current Outpatient Prescriptions  Medication Sig Dispense Refill  . acetaminophen (TYLENOL) 650 MG CR tablet Take 1 tablet (650 mg total) by mouth every 8 (eight) hours as needed for pain. 90 tablet 3  . albuterol (PROAIR HFA) 108 (90 Base) MCG/ACT inhaler Inhale 2 puffs into the lungs every 6 (six) hours as needed.    . ALPRAZolam (XANAX) 0.5 MG tablet TAKE 1 TABLET BY MOUTH TWICE DAILY AS NEEDED FOR ANXIETY 45 tablet 0  . balsalazide (COLAZAL) 750 MG capsule Take 2,250 mg by mouth 3 (three) times daily.    . cyclobenzaprine (FLEXERIL) 10 MG  tablet TAKE 1/2 (ONE-HALF) TABLET BY MOUTH AT BEDTIME, THEN  INCREASE  GRADUALLY  TO  1  TABLET  3  TIMES  DAILY 30 tablet 0  . Fingolimod HCl (GILENYA) 0.5 MG CAPS Take 0.5 capsules by mouth daily.    . fluticasone (FLONASE) 50 MCG/ACT nasal spray Place 2 sprays into both nostrils daily. 16 g 3  . hydrOXYzine (ATARAX/VISTARIL) 25 MG tablet Take 1 tablet (25 mg total) by mouth 3 (three) times daily as needed for itching. 60 tablet 0  . ibuprofen (ADVIL,MOTRIN) 800 MG tablet Take 1 tablet (800 mg total) by mouth every 8 (eight) hours as needed. 90 tablet 2  . ipratropium (ATROVENT) 0.06 % nasal spray Place 1 spray into both nostrils 4 (four) times daily as needed. 15 mL 0  . OLANZapine (ZYPREXA) 2.5 MG tablet Take 1 tablet (2.5 mg total) by mouth daily. 30 tablet 3  . zolpidem (AMBIEN) 10 MG tablet Take 1 tablet (10 mg total) by mouth at bedtime as needed. for sleep 30 tablet 1   No current facility-administered medications for this visit.       Psychiatric Specialty Exam: Review of Systems  Cardiovascular: Negative for palpitations.  Musculoskeletal: Positive for joint pain and myalgias.  Skin: Negative for rash.  Neurological: Negative for tremors and headaches.  Psychiatric/Behavioral: Negative for depression and suicidal ideas.    Blood pressure 126/76, pulse 84, resp. rate 16, height 5' 8.5" (1.74 m), weight 200 lb (90.7 kg), SpO2 94 %.Body mass index is 29.97 kg/m.  General Appearance: Casual  Eye Contact:  Fair  Speech:  Normal Rate  Volume:  normal  Mood: euthymic  Affect:  Reactive congruent  Thought Process:  Goal Directed  Orientation:  Full (Time, Place, and Person)  Thought Content:  Logical  Suicidal Thoughts:  No  Homicidal Thoughts:  No  Memory:  Immediate;   Fair Recent;   Fair  Judgement:  Fair  Insight:  Fair  Psychomotor Activity:  Normal  Concentration:  Concentration: Fair and Attention Span: Fair  Recall:  [employer] of Knowledge:Fair  Language: Fair   Akathisia:  Negative  Handed:  Right  AIMS (if indicated):    Assets:  Desire for Improvement  ADL's:  Intact  Cognition: WNL  Sleep:  Fair while on meds    Treatment Plan Summary: Medication management and Plan as follows  Bipolar 2:  Stable. Continue olanzapine 2.5mg  GAD: not worse. On xanax prn form [facility] and can continue Insomnia: pain can effect otherwise not worse FU 4 months. Renewed olanzapine. No tremors   Zakary Kimura, MD 10/31/201810:20 AM

## 2017-09-09 ENCOUNTER — Other Ambulatory Visit: Payer: Self-pay | Admitting: Physician Assistant

## 2017-09-17 DIAGNOSIS — M19049 Primary osteoarthritis, unspecified hand: Secondary | ICD-10-CM | POA: Diagnosis not present

## 2017-09-17 DIAGNOSIS — S62235G Other nondisplaced fracture of base of first metacarpal bone, left hand, subsequent encounter for fracture with delayed healing: Secondary | ICD-10-CM | POA: Diagnosis not present

## 2017-09-17 DIAGNOSIS — M1812 Unilateral primary osteoarthritis of first carpometacarpal joint, left hand: Secondary | ICD-10-CM | POA: Diagnosis not present

## 2017-09-17 DIAGNOSIS — M9689 Other intraoperative and postprocedural complications and disorders of the musculoskeletal system: Secondary | ICD-10-CM | POA: Diagnosis not present

## 2017-09-17 DIAGNOSIS — Z981 Arthrodesis status: Secondary | ICD-10-CM | POA: Diagnosis not present

## 2017-09-17 DIAGNOSIS — Z4789 Encounter for other orthopedic aftercare: Secondary | ICD-10-CM | POA: Diagnosis not present

## 2017-09-27 DIAGNOSIS — G35 Multiple sclerosis: Secondary | ICD-10-CM | POA: Diagnosis not present

## 2017-09-28 DIAGNOSIS — M47816 Spondylosis without myelopathy or radiculopathy, lumbar region: Secondary | ICD-10-CM | POA: Diagnosis not present

## 2017-09-28 DIAGNOSIS — M461 Sacroiliitis, not elsewhere classified: Secondary | ICD-10-CM | POA: Diagnosis not present

## 2017-09-28 DIAGNOSIS — M791 Myalgia, unspecified site: Secondary | ICD-10-CM | POA: Diagnosis not present

## 2017-10-01 ENCOUNTER — Other Ambulatory Visit: Payer: Self-pay | Admitting: Physician Assistant

## 2017-10-04 NOTE — Telephone Encounter (Signed)
Our office received patient's prescription for Chantix through the mail. Janna Arch picked up this medication for patient on 10/04/17.

## 2017-10-11 IMAGING — DX DG ELBOW COMPLETE 3+V*R*
4 series · 4 of 4 positions shown · non-contrast
Comparison: None.

CLINICAL DATA: Posterior right elbow pain for 3 weeks. No known
injury. Initial encounter.

EXAM:
RIGHT ELBOW - COMPLETE 3+ VIEW

[elbow ap]
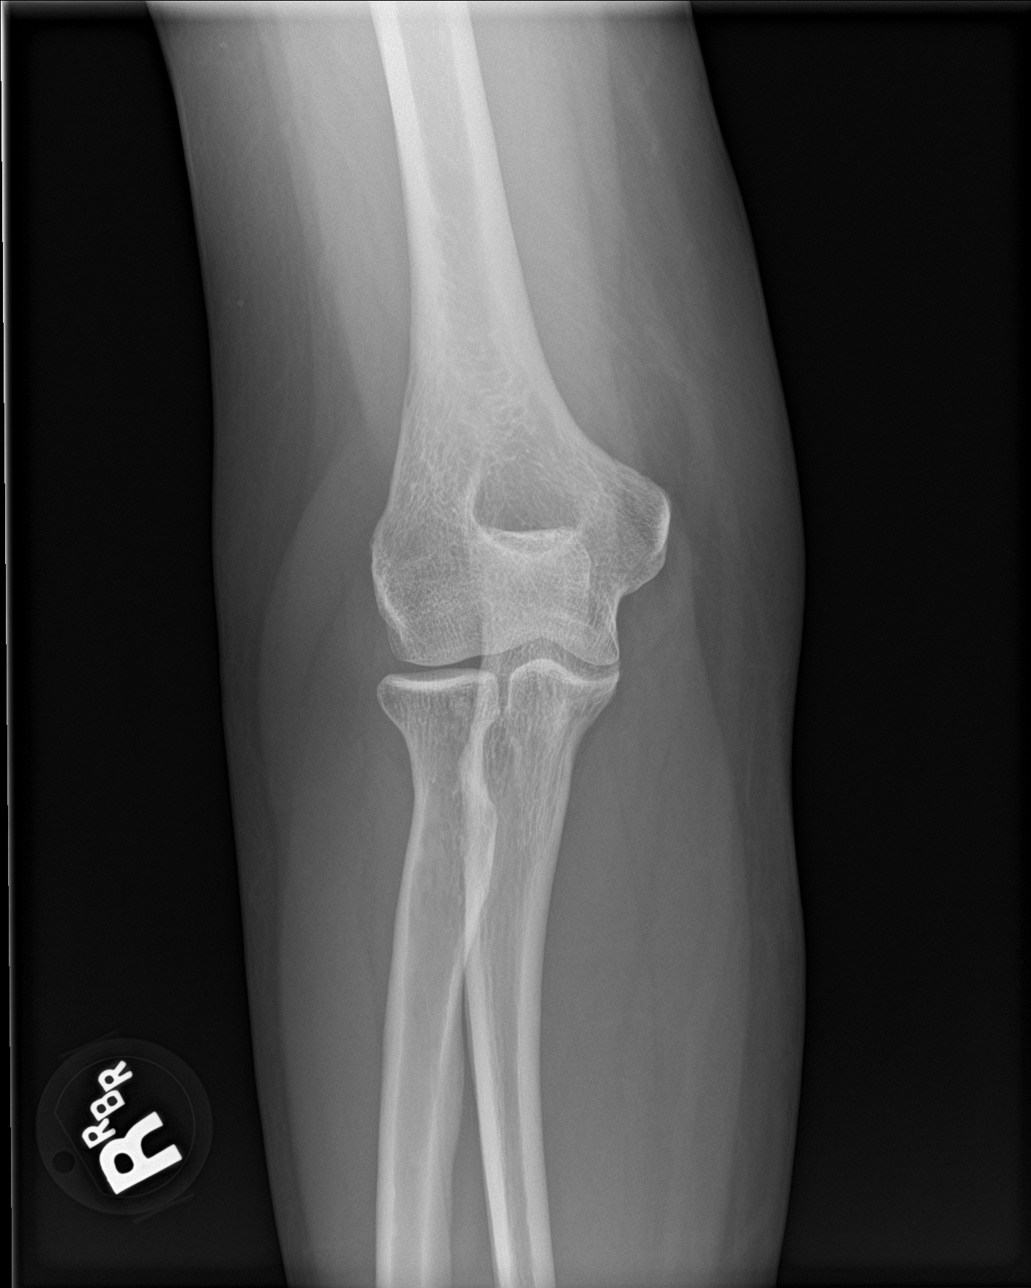

[elbow obl (1 of 2)]
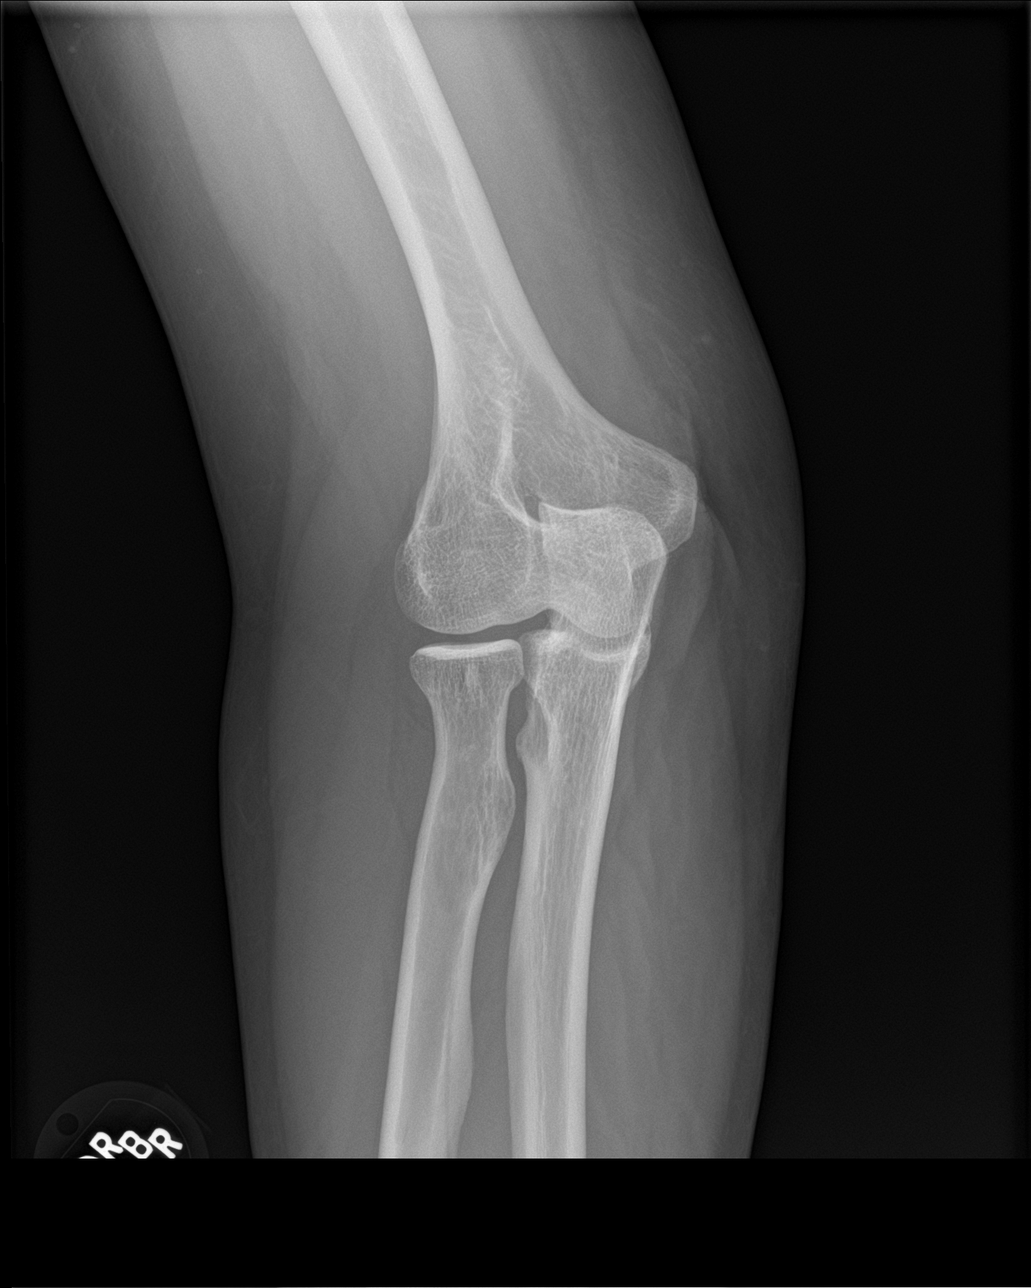

[elbow lat]
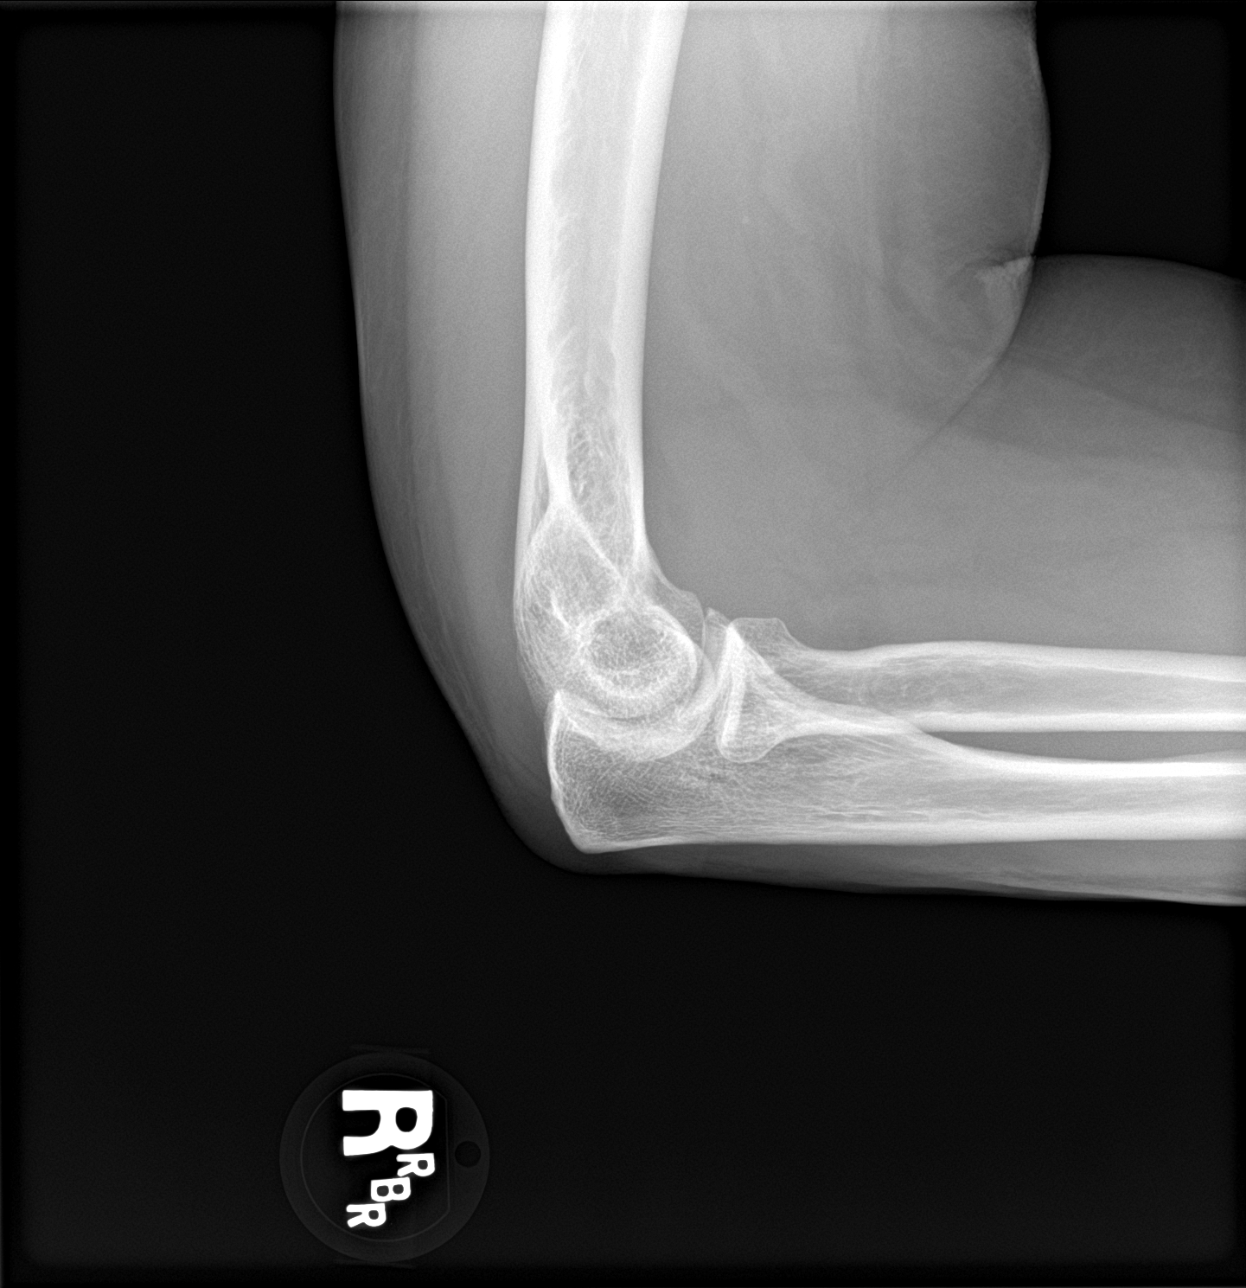

[elbow obl (2 of 2)]
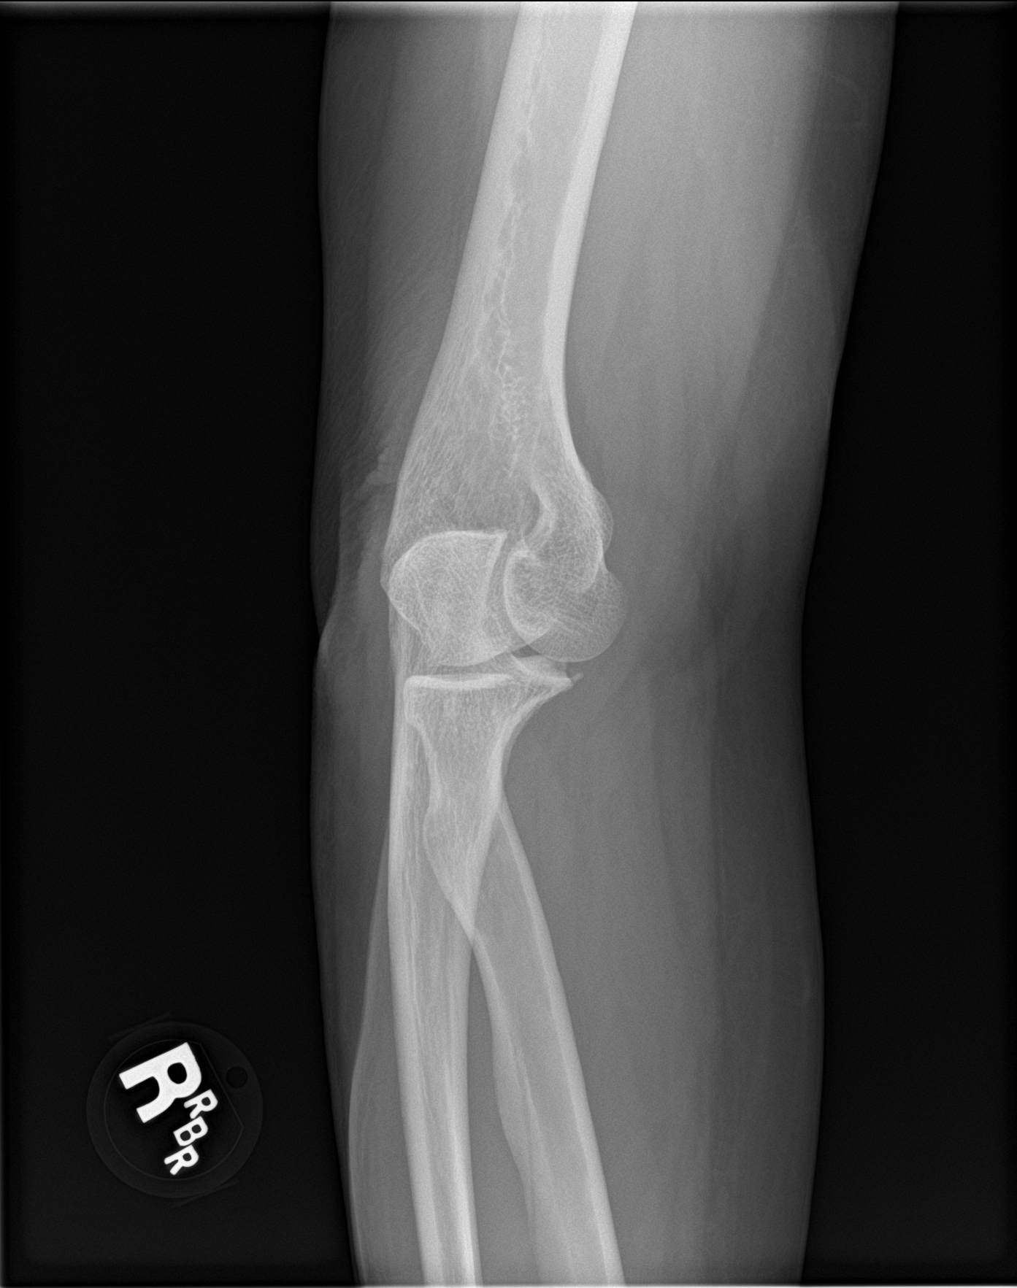

[4 of 4 positions shown; findings below may reference images not displayed]

FINDINGS: No fracture dislocation is identified. Minimal fragmentation of the
tip of the coronoid process is likely degenerative. Elbow joint
effusion is noted. Soft tissues are unremarkable.
IMPRESSION: Elbow joint effusion.  Otherwise negative.

## 2017-10-12 ENCOUNTER — Other Ambulatory Visit: Payer: Self-pay | Admitting: Physician Assistant

## 2017-10-14 ENCOUNTER — Encounter: Payer: Self-pay | Admitting: Family Medicine

## 2017-10-14 ENCOUNTER — Ambulatory Visit (INDEPENDENT_AMBULATORY_CARE_PROVIDER_SITE_OTHER): Payer: Medicare Other | Admitting: Family Medicine

## 2017-10-14 ENCOUNTER — Telehealth: Payer: Self-pay | Admitting: Physician Assistant

## 2017-10-14 VITALS — BP 121/84 | HR 97 | Temp 98.3°F | Ht 68.0 in | Wt 203.0 lb

## 2017-10-14 DIAGNOSIS — G5611 Other lesions of median nerve, right upper limb: Secondary | ICD-10-CM | POA: Diagnosis not present

## 2017-10-14 DIAGNOSIS — J0101 Acute recurrent maxillary sinusitis: Secondary | ICD-10-CM

## 2017-10-14 DIAGNOSIS — M7701 Medial epicondylitis, right elbow: Secondary | ICD-10-CM

## 2017-10-14 MED ORDER — CEFDINIR 300 MG PO CAPS: 300.0000 mg | ORAL_CAPSULE | Freq: Two times a day (BID) | ORAL | 1 refills | Status: DC

## 2017-10-14 MED ORDER — PREDNISONE 10 MG PO TABS: 30.0000 mg | ORAL_TABLET | Freq: Every day | ORAL | 0 refills | Status: DC

## 2017-10-14 MED ORDER — OFLOXACIN 0.3 % OT SOLN: 10.0000 [drp] | Freq: Every day | OTIC | 0 refills | Status: DC

## 2017-10-14 NOTE — Patient Instructions (Signed)
Thank you for coming in today. Take omnicef twice daily.  Use prednisone if not better/.  Use the ear drops if you can get them.   Ask hand PT to add exercise for Medial Epicondylitis, and Pronator Tendonitis.   Recheck as needed.

## 2017-10-14 NOTE — Telephone Encounter (Signed)
Routing to ordering Provider.

## 2017-10-14 NOTE — Telephone Encounter (Signed)
Are we talking about the oral omnicef or the ear drops? If it is the ear drops she CAN use the coupon just without the insurance.   I dont think there is anything safe that is cheaper.   Ellard Artis

## 2017-10-14 NOTE — Progress Notes (Signed)
Paige Lozano is a 51 y.o. female who presents to [place]: [place] today for sinus congestion and pain present for over 1 week.  Patient is coughing congestion and right-sided sinus pain and pressure associated with right ear pain.  Symptoms are consistent with history of recurrent bacterial sinus infection.  She is tried some over-the-counter medicines which do not help very well.  No fevers or chills.  She has a history of MS and is on a monoclonal antibody to treat this condition.  This medication causes mild immunosuppression.  Additionally Paige Lozano has right elbow pain.  She notes pain at the medial epicondyles worse with wrist flexion and hand pronation.  She fell several months ago and had a normal x-ray.  She is been treated by her primary care provider with conservative management but continues to experience pain.  The pain interferes with her ability to exercise normally.  She denies any radiating pain weakness or numbness.  Past Medical History:  Diagnosis Date  . Anxiety   . Bipolar 2 disorder, major depressive episode ([place])   . Complication of anesthesia   . COPD (chronic obstructive pulmonary disease) ([place])    smoker  . Fibromyalgia   . Insomnia   . Multiple sclerosis ([place])   . PONV (postoperative nausea and vomiting)   . Pulmonary embolism ([place]) 2000  . Sleep apnea    mild OSA no CPAP  . Ulcerative colitis [facility])    Past Surgical History:  Procedure Laterality Date  . FOOT SURGERY Left    bone spur  . HEMORRHOID SURGERY    . HUMERUS FRACTURE SURGERY Left   . MYOMECTOMY    . NASAL SINUS SURGERY    . SHOULDER ARTHROSCOPY WITH SUBACROMIAL DECOMPRESSION AND BICEP TENDON REPAIR Left 07/27/2016   Procedure: SHOULDER ARTHROSCOPY DEBRIDEMENT ROTATOR CUFF AND LABRUM, SUBACROMIAL DECOMPRESSION, BICEPS TENODESIS;  Surgeon: Tania Ade, MD;  Location: [place];  Service: Orthopedics;  Laterality: Left;  SHOULDER ARTHROSCOPY DEBRIDEMENT ROTATOR CUFF AND LABRUM, SUBACROMIAL DECOMPRESSION, BICEPS TENOTOMY, POSSIBLE TENODESIS  . THUMB ARTHROSCOPY     5 surgeries on left thumb, 4 surgeries on right  . TONSILLECTOMY AND ADENOIDECTOMY     Social History   Tobacco Use  . Smoking status: Current Every Day Smoker    Packs/day: 0.25    Types: Cigarettes    Last attempt to quit: 07/08/2016    Years since quitting: 1.2  . Smokeless tobacco: Never Used  Substance Use Topics  . Alcohol use: Yes    Alcohol/week: 0.0 oz    Comment: rare    family history is not on file. She was adopted.  ROS as above:  Medications: Current Outpatient Medications  Medication Sig Dispense Refill  . acetaminophen (TYLENOL) 650 MG CR tablet Take 1 tablet (650 mg total) by mouth every 8 (eight) hours as needed for pain. 90 tablet 3  . albuterol (PROAIR HFA) 108 (90 Base) MCG/ACT inhaler Inhale 2 puffs into the lungs every 6 (six) hours as needed.    . ALPRAZolam (XANAX) 0.5 MG tablet TAKE 1 TABLET BY MOUTH TWICE DAILY AS NEEDED FOR ANXIETY 45 tablet 0  . balsalazide (COLAZAL) 750 MG capsule Take 2,250 mg by mouth 3 (three) times daily.    . cyclobenzaprine (FLEXERIL) 10 MG tablet TAKE 1/2 (ONE-HALF) TABLET BY MOUTH AT BEDTIME, THEN  INCREASE  GRADUALLY  TO  1  TABLET  3  TIMES  DAILY 30 tablet  0  . Fingolimod HCl (GILENYA) 0.5 MG CAPS Take 0.5 capsules by mouth daily.    . fluticasone (FLONASE) 50 MCG/ACT nasal spray Place 2 sprays into both nostrils daily. 16 g 3  . hydrOXYzine (ATARAX/VISTARIL) 25 MG tablet Take 1 tablet (25 mg total) by mouth 3 (three) times daily as needed for itching. 60 tablet 0  . ibuprofen (ADVIL,MOTRIN) 800 MG tablet Take 1 tablet (800 mg total) by mouth every 8 (eight) hours as needed. 90 tablet 2  . ipratropium (ATROVENT) 0.06 % nasal spray Place 1 spray into both nostrils 4 (four) times daily as needed. 15 mL 0  . OLANZapine  (ZYPREXA) 2.5 MG tablet Take 1 tablet (2.5 mg total) by mouth daily. 30 tablet 3  . zolpidem (AMBIEN) 10 MG tablet Take 1 tablet (10 mg total) by mouth at bedtime. MUST make appointment for future refills. 30 tablet 0  . cefdinir (OMNICEF) 300 MG capsule Take 1 capsule (300 mg total) by mouth 2 (two) times daily. 20 capsule 1  . ofloxacin (FLOXIN) 0.3 % OTIC solution Place 10 drops into the right ear daily. For 7 days. 10 mL 0  . predniSONE (DELTASONE) 10 MG tablet Take 3 tablets (30 mg total) by mouth daily with breakfast. 15 tablet 0   No current facility-administered medications for this visit.    Allergies  Allergen Reactions  . Ketamine Anaphylaxis  . Oxycodone Diarrhea  . Papaya Enzyme Anaphylaxis    Health Maintenance Health Maintenance  Topic Date Due  . HIV Screening  10/05/1981  . TETANUS/TDAP  10/05/1985  . PAP SMEAR  10/06/1987  . MAMMOGRAM  10/05/2016  . COLONOSCOPY  10/05/2016  . INFLUENZA VACCINE  09/09/2018 (Originally 06/09/2017)     Exam:  BP 121/84   Pulse 97   Temp 98.3 F (36.8 C) (Oral)   Ht 5\' 8"  (1.727 m)   Wt 203 lb (92.1 kg)   BMI 30.87 kg/m  Gen: Well NAD HEENT: EOMI,  MMM right tympanic membrane is erythematous with a small amount of blood with no obvious tympanic membrane rupture.  No discharge. Posterior pharynx is erythematous with cobblestoning. Tender to palpation right maxillary sinus. Mildly tender cervical lymphadenopathy is present on the right normal on the left Lungs: Normal work of breathing. CTABL Heart: RRR no MRG Abd: NABS, Soft. Nondistended, Nontender Exts: Brisk capillary refill, warm and well perfused.  MSK: Right elbow normal-appearing.  Tender palpation at medial epicondyle.  Pain with resisted wrist flexion and pronation. Motion and strength otherwise   No results found for this or any previous visit (from the past 72 hour(s)). No results found.    Assessment and Plan: 52 y.o. female with  Recurrent bacterial  sinusitis.  This is also associated with right ear pain with erythema and blood at the tympanic membrane.  This is concerning for otitis externa potentially.  Plan to treat with Omnicef and ofloxacin eardrops.  Use prednisone if not better.  Recheck in the near future if worsening or not improving.  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  She is already receiving hand therapy for her left hand.  I have advised the hand therapist to start work on her right arm.  Additionally will treat with home exercise program and topical over-the-counter Aspercreme.  Recheck as needed.   No orders of the defined types were placed in this encounter.  Meds ordered this encounter  Medications  . cefdinir (OMNICEF) 300 MG capsule    Sig: Take  1 capsule (300 mg total) by mouth 2 (two) times daily.    Dispense:  20 capsule    Refill:  1  . predniSONE (DELTASONE) 10 MG tablet    Sig: Take 3 tablets (30 mg total) by mouth daily with breakfast.    Dispense:  15 tablet    Refill:  0  . ofloxacin (FLOXIN) 0.3 % OTIC solution    Sig: Place 10 drops into the right ear daily. For 7 days.    Dispense:  10 mL    Refill:  0     Discussed warning signs or symptoms. Please see discharge instructions. Patient expresses understanding.

## 2017-10-14 NOTE — Telephone Encounter (Signed)
Message passed on to patient. She will use coupon for eardrops without her insurance.  Thanks.

## 2017-10-14 NOTE — Telephone Encounter (Signed)
Pt called. Seen today,m ed cost $100 but coupon given does not work because she has [employer]. Would like another med called in, she's on her way to pharmacy now and wants to pick up med at that time.

## 2017-10-15 ENCOUNTER — Ambulatory Visit: Payer: Self-pay | Admitting: Physician Assistant

## 2017-10-15 DIAGNOSIS — M19042 Primary osteoarthritis, left hand: Secondary | ICD-10-CM | POA: Diagnosis not present

## 2017-10-15 DIAGNOSIS — Z967 Presence of other bone and tendon implants: Secondary | ICD-10-CM | POA: Diagnosis not present

## 2017-10-15 DIAGNOSIS — S62235D Other nondisplaced fracture of base of first metacarpal bone, left hand, subsequent encounter for fracture with routine healing: Secondary | ICD-10-CM | POA: Diagnosis not present

## 2017-10-15 DIAGNOSIS — S6992XA Unspecified injury of left wrist, hand and finger(s), initial encounter: Secondary | ICD-10-CM | POA: Diagnosis not present

## 2017-10-15 DIAGNOSIS — M79642 Pain in left hand: Secondary | ICD-10-CM | POA: Diagnosis not present

## 2017-10-15 DIAGNOSIS — M19049 Primary osteoarthritis, unspecified hand: Secondary | ICD-10-CM | POA: Diagnosis not present

## 2017-10-19 ENCOUNTER — Other Ambulatory Visit: Payer: Self-pay | Admitting: *Deleted

## 2017-10-19 MED ORDER — FLUCONAZOLE 150 MG PO TABS: 150.0000 mg | ORAL_TABLET | Freq: Once | ORAL | 0 refills | Status: AC

## 2017-10-29 ENCOUNTER — Encounter: Payer: Self-pay | Admitting: Family Medicine

## 2017-10-29 ENCOUNTER — Ambulatory Visit (INDEPENDENT_AMBULATORY_CARE_PROVIDER_SITE_OTHER): Payer: Medicare Other | Admitting: Family Medicine

## 2017-10-29 VITALS — BP 139/79 | HR 98 | Wt 206.0 lb

## 2017-10-29 DIAGNOSIS — M722 Plantar fascial fibromatosis: Secondary | ICD-10-CM

## 2017-10-29 DIAGNOSIS — K5100 Ulcerative (chronic) pancolitis without complications: Secondary | ICD-10-CM

## 2017-10-29 DIAGNOSIS — G35 Multiple sclerosis: Secondary | ICD-10-CM | POA: Diagnosis not present

## 2017-10-29 MED ORDER — BALSALAZIDE DISODIUM 750 MG PO CAPS: 2250.0000 mg | ORAL_CAPSULE | Freq: Three times a day (TID) | ORAL | 0 refills | Status: DC

## 2017-10-29 NOTE — Patient Instructions (Signed)
Thank you for coming in today. Call or go to the ER if you develop a large red swollen joint with extreme pain or oozing puss.  Follow up with gastroenterology.  Do the home exercise and ice massage we discussed  Recheck in 6 weeks.    Plantar Fasciitis Plantar fasciitis is a painful foot condition that affects the heel. It occurs when the band of tissue that connects the toes to the heel bone (plantar fascia) becomes irritated. This can happen after exercising too much or doing other repetitive activities (overuse injury). The pain from plantar fasciitis can range from mild irritation to severe pain that makes it difficult for you to walk or move. The pain is usually worse in the morning or after you have been sitting or lying down for a while. What are the causes? This condition may be caused by:  Standing for long periods of time.  Wearing shoes that do not fit.  Doing high-impact activities, including running, aerobics, and ballet.  Being overweight.  Having an abnormal way of walking (gait).  Having tight calf muscles.  Having high arches in your feet.  Starting a new athletic activity.  What are the signs or symptoms? The main symptom of this condition is heel pain. Other symptoms include:  Pain that gets worse after activity or exercise.  Pain that is worse in the morning or after resting.  Pain that goes away after you walk for a few minutes.  How is this diagnosed? This condition may be diagnosed based on your signs and symptoms. Your health care provider will also do a physical exam to check for:  A tender area on the bottom of your foot.  A high arch in your foot.  Pain when you move your foot.  Difficulty moving your foot.  You may also need to have imaging studies to confirm the diagnosis. These can include:  X-rays.  Ultrasound.  MRI.  How is this treated? Treatment for plantar fasciitis depends on the severity of the condition. Your treatment  may include:  Rest, ice, and over-the-counter pain medicines to manage your pain.  Exercises to stretch your calves and your plantar fascia.  A splint that holds your foot in a stretched, upward position while you sleep (night splint).  Physical therapy to relieve symptoms and prevent problems in the future.  Cortisone injections to relieve severe pain.  Extracorporeal shock wave therapy (ESWT) to stimulate damaged plantar fascia with electrical impulses. It is often used as a last resort before surgery.  Surgery, if other treatments have not worked after 12 months.  Follow these instructions at home:  Take medicines only as directed by your health care provider.  Avoid activities that cause pain.  Roll the bottom of your foot over a bag of ice or a bottle of cold water. Do this for 20 minutes, 3-4 times a day.  Perform simple stretches as directed by your health care provider.  Try wearing athletic shoes with air-sole or gel-sole cushions or soft shoe inserts.  Wear a night splint while sleeping, if directed by your health care provider.  Keep all follow-up appointments with your health care provider. How is this prevented?  Do not perform exercises or activities that cause heel pain.  Consider finding low-impact activities if you continue to have problems.  Lose weight if you need to. The best way to prevent plantar fasciitis is to avoid the activities that aggravate your plantar fascia. Contact a health care provider if:  Your symptoms do not go away after treatment with home care measures.  Your pain gets worse.  Your pain affects your ability to move or do your daily activities. This information is not intended to replace advice given to you by your health care provider. Make sure you discuss any questions you have with your health care provider. Document Released: 07/21/2001 Document Revised: 03/30/2016 Document Reviewed: 09/05/2014 Elsevier Interactive Patient  Education  [employer].

## 2017-10-29 NOTE — Progress Notes (Signed)
Paige Lozano is a 51 y.o. female who presents to [facility] today for plantar fasciitis.  Paige Lozano has a several month history of worsening plantar foot pain bilaterally.  The left foot is worse than the right.  She notes the pain worsened when she increased her activities and started exercising more.  The pain worsened again recently a few weeks ago.  It got to the point where it is quite severe and interfering with her ability to walk normally at times.  She is going on vacation later this week and is worried that with all the walking she will have to do her pain will be very bad.  She is tried some home exercises and treatments such as icing and some basic stretching which helps a little.  She is done some research and suspects that she has plantar fasciitis.  Of note Paige Lozano is about to run out of her Balslazide.  She has a follow-up appoint with gastroenterology in a few weeks and would like a 1 month refill if possible.  She cannot get in with her PCP in my practice before then either.    Past Medical History:  Diagnosis Date  . Anxiety   . Bipolar 2 disorder, major depressive episode ([place])   . Complication of anesthesia   . COPD (chronic obstructive pulmonary disease) ([place])    smoker  . Fibromyalgia   . Insomnia   . Multiple sclerosis ([place])   . PONV (postoperative nausea and vomiting)   . Pulmonary embolism ([place]) 2000  . Sleep apnea    mild OSA no CPAP  . Ulcerative colitis [facility])    Past Surgical History:  Procedure Laterality Date  . FOOT SURGERY Left    bone spur  . HEMORRHOID SURGERY    . HUMERUS FRACTURE SURGERY Left   . MYOMECTOMY    . NASAL SINUS SURGERY    . SHOULDER ARTHROSCOPY WITH SUBACROMIAL DECOMPRESSION AND BICEP TENDON REPAIR Left 07/27/2016   Procedure: SHOULDER ARTHROSCOPY DEBRIDEMENT ROTATOR CUFF AND LABRUM, SUBACROMIAL DECOMPRESSION, BICEPS TENODESIS;  Surgeon: Tania Ade, MD;  Location: [facility];  Service: Orthopedics;  Laterality: Left;  SHOULDER ARTHROSCOPY DEBRIDEMENT ROTATOR CUFF AND LABRUM, SUBACROMIAL DECOMPRESSION, BICEPS TENOTOMY, POSSIBLE TENODESIS  . THUMB ARTHROSCOPY     5 surgeries on left thumb, 4 surgeries on right  . TONSILLECTOMY AND ADENOIDECTOMY     Social History   Tobacco Use  . Smoking status: Current Every Day Smoker    Packs/day: 0.25    Types: Cigarettes    Last attempt to quit: 07/08/2016    Years since quitting: 1.3  . Smokeless tobacco: Never Used  Substance Use Topics  . Alcohol use: Yes    Alcohol/week: 0.0 oz    Comment: rare      ROS:  As above   Medications: Current Outpatient Medications  Medication Sig Dispense Refill  . acetaminophen (TYLENOL) 650 MG CR tablet Take 1 tablet (650 mg total) by mouth every 8 (eight) hours as needed for pain. 90 tablet 3  . albuterol (PROAIR HFA) 108 (90 Base) MCG/ACT inhaler Inhale 2 puffs into the lungs every 6 (six) hours as needed.    . ALPRAZolam (XANAX) 0.5 MG tablet TAKE 1 TABLET BY MOUTH TWICE DAILY AS NEEDED FOR ANXIETY 45 tablet 0  . balsalazide (COLAZAL) 750 MG capsule Take 3 capsules (2,250 mg total) by mouth 3 (three) times daily. 270 capsule 0  . cefdinir (OMNICEF) 300 MG capsule Take 1  capsule (300 mg total) by mouth 2 (two) times daily. 20 capsule 1  . cyclobenzaprine (FLEXERIL) 10 MG tablet TAKE 1/2 (ONE-HALF) TABLET BY MOUTH AT BEDTIME, THEN  INCREASE  GRADUALLY  TO  1  TABLET  3  TIMES  DAILY 30 tablet 0  . Fingolimod HCl (GILENYA) 0.5 MG CAPS Take 0.5 capsules by mouth daily.    . fluticasone (FLONASE) 50 MCG/ACT nasal spray Place 2 sprays into both nostrils daily. 16 g 3  . hydrOXYzine (ATARAX/VISTARIL) 25 MG tablet Take 1 tablet (25 mg total) by mouth 3 (three) times daily as needed for itching. 60 tablet 0  . ibuprofen (ADVIL,MOTRIN) 800 MG tablet Take 1 tablet (800 mg total) by mouth every 8 (eight) hours as needed. 90 tablet 2  . ipratropium (ATROVENT) 0.06 % nasal spray  Place 1 spray into both nostrils 4 (four) times daily as needed. 15 mL 0  . ofloxacin (FLOXIN) 0.3 % OTIC solution Place 10 drops into the right ear daily. For 7 days. 10 mL 0  . OLANZapine (ZYPREXA) 2.5 MG tablet Take 1 tablet (2.5 mg total) by mouth daily. 30 tablet 3  . zolpidem (AMBIEN) 10 MG tablet Take 1 tablet (10 mg total) by mouth at bedtime. MUST make appointment for future refills. 30 tablet 0   No current facility-administered medications for this visit.    Allergies  Allergen Reactions  . Ketamine Anaphylaxis  . Oxycodone Diarrhea  . Papaya Enzyme Anaphylaxis     Exam:  BP 139/79   Pulse 98   Wt 206 lb (93.4 kg)   BMI 31.32 kg/m  General: Well Developed, well nourished, and in no acute distress.  Neuro/Psych: Alert and oriented x3, extra-ocular muscles intact, able to move all 4 extremities, sensation grossly intact. Skin: Warm and dry, no rashes noted.  Respiratory: Not using accessory muscles, speaking in full sentences, trachea midline.  Cardiovascular: Pulses palpable, no extremity edema. Abdomen: Does not appear distended. MSK: Feet bilaterally and normal-appearing. Tender to palpation bilateral medial plantar calcaneus. Normal foot motion.  Pulses capillary refill and sensation are intact  Procedure: Real-time Ultrasound Guided Injection of left plantar fascia Device: GE Logiq E  Images permanently stored and available for review in the ultrasound unit. Verbal informed consent obtained. Discussed risks and benefits of procedure. Warned about infection bleeding damage to structures skin hypopigmentation and fat atrophy among others. Patient expresses understanding and agreement Time-out conducted.  Noted no overlying erythema, induration, or other signs of local infection.  Skin prepped in a sterile fashion.  Local anesthesia: Topical Ethyl chloride.  With sterile technique and under real time ultrasound guidance:40 mg of Kenalog and 1 mL of Marcaine  injected easily.  Completed without difficulty  Pain immediately resolved suggesting accurate placement of the medication.  Advised to call if fevers/chills, erythema, induration, drainage, or persistent bleeding.  Images permanently stored and available for review in the ultrasound unit.  Impression: Technically successful ultrasound guided injection.  Procedure: Real-time Ultrasound Guided Injection of right plantar fascia Device: GE Logiq E  Images permanently stored and available for review in the ultrasound unit. Verbal informed consent obtained. Discussed risks and benefits of procedure. Warned about infection bleeding damage to structures skin hypopigmentation and fat atrophy among others. Patient expresses understanding and agreement Time-out conducted.  Noted no overlying erythema, induration, or other signs of local infection.  Skin prepped in a sterile fashion.  Local anesthesia: Topical Ethyl chloride.  With sterile technique and under real time ultrasound guidance:40 mg  of Kenalog and 1 mL of Marcaine injected easily.  Completed without difficulty  Pain immediately resolved suggesting accurate placement of the medication.  Advised to call if fevers/chills, erythema, induration, drainage, or persistent bleeding.  Images permanently stored and available for review in the ultrasound unit.  Impression: Technically successful ultrasound guided injection.   No results found for this or any previous visit (from the past 48 hour(s)). No results found.    Assessment and Plan: 51 y.o. female with bilateral plantar fasciitis.  Treat with plantar fascia injections as noted above.  Additionally will use gel heel cups eccentric exercises and ice massage.  Recheck in about 6 weeks.  We will temporarily refill Balsalazide today as a courtesy.  I believe this to be safe as she has been on it for a long time now.  Follow-up with gastroenterology.   No orders of the  defined types were placed in this encounter.  Meds ordered this encounter  Medications  . balsalazide (COLAZAL) 750 MG capsule    Sig: Take 3 capsules (2,250 mg total) by mouth 3 (three) times daily.    Dispense:  270 capsule    Refill:  0    Discussed warning signs or symptoms. Please see discharge instructions. Patient expresses understanding.

## 2017-11-17 DIAGNOSIS — S62235D Other nondisplaced fracture of base of first metacarpal bone, left hand, subsequent encounter for fracture with routine healing: Secondary | ICD-10-CM | POA: Diagnosis not present

## 2017-11-19 ENCOUNTER — Other Ambulatory Visit: Payer: Self-pay

## 2017-11-19 MED ORDER — ZOLPIDEM TARTRATE 10 MG PO TABS: 10.0000 mg | ORAL_TABLET | Freq: Every day | ORAL | 0 refills | Status: DC

## 2017-11-19 NOTE — Progress Notes (Signed)
Sent refill and patient is to schedule follow up.

## 2017-11-22 ENCOUNTER — Encounter: Payer: Self-pay | Admitting: Physician Assistant

## 2017-11-22 ENCOUNTER — Other Ambulatory Visit: Payer: Self-pay | Admitting: *Deleted

## 2017-11-22 ENCOUNTER — Ambulatory Visit (INDEPENDENT_AMBULATORY_CARE_PROVIDER_SITE_OTHER): Payer: Medicare Other | Admitting: Physician Assistant

## 2017-11-22 VITALS — BP 116/69 | HR 79 | Ht 68.0 in | Wt 203.0 lb

## 2017-11-22 DIAGNOSIS — Z9889 Other specified postprocedural states: Secondary | ICD-10-CM | POA: Diagnosis not present

## 2017-11-22 DIAGNOSIS — J329 Chronic sinusitis, unspecified: Secondary | ICD-10-CM

## 2017-11-22 DIAGNOSIS — R05 Cough: Secondary | ICD-10-CM | POA: Diagnosis not present

## 2017-11-22 DIAGNOSIS — E6609 Other obesity due to excess calories: Secondary | ICD-10-CM

## 2017-11-22 DIAGNOSIS — R059 Cough, unspecified: Secondary | ICD-10-CM

## 2017-11-22 DIAGNOSIS — Z6830 Body mass index (BMI) 30.0-30.9, adult: Secondary | ICD-10-CM

## 2017-11-22 DIAGNOSIS — R0981 Nasal congestion: Secondary | ICD-10-CM

## 2017-11-22 MED ORDER — BENZONATATE 200 MG PO CAPS
200.0000 mg | ORAL_CAPSULE | Freq: Two times a day (BID) | ORAL | 0 refills | Status: DC | PRN
Start: 2017-11-22 — End: 2018-02-17

## 2017-11-22 MED ORDER — PHENTERMINE HCL 37.5 MG PO TABS: 37.5000 mg | ORAL_TABLET | Freq: Every day | ORAL | 0 refills | Status: DC

## 2017-11-22 MED ORDER — DOXYCYCLINE HYCLATE 100 MG PO TABS: 100.0000 mg | ORAL_TABLET | Freq: Two times a day (BID) | ORAL | 0 refills | Status: DC

## 2017-11-22 NOTE — Addendum Note (Signed)
Addended by: Donella Stade on: 11/22/2017 11:54 AM   Modules accepted: Orders, Level of Service

## 2017-11-22 NOTE — Progress Notes (Addendum)
Subjective:    Patient ID: Paige Lozano, female    DOB: 1966/07/07, 52 y.o.   MRN: [id number]  HPI Pt is a 52 year old female with a history of multiple sclerosis and ulcerative colitis, presenting to the clinic with complaints of a 4 day dry cough that has turned into a productive cough within the last day. She came into the clinic because she was informed that her  MS medication tysabria that she has been taking for the last 6 months can weaken her immune system and she is supposed to get ahead of infections per her doctor. She reports right sided sinus congestion that has been almost persistent since last visit. She had antibiotics(omnicef) in December for sinusitis which helped for a few days but the same symptoms have returned. She reports previously had a surgery to help address recurrent sinusitis but says that she has continued to have problems.   During her past episode in December of sinusitis she also perforated her right ear drum and was asking for her ear to be rechecked as she is working part time as Press photographer and would like to known if its okay to submerge her head underwater.   Additionally, she complains of her weight being static despite eating a better diet and being in the gym 5 days a week usually for 3 hours per day. Her lack of weight loss is concerning and she was inquiring about medications to help with weight loss. She has a cruise in 8 weeks that she really wans to lose weight for.   ... Active Ambulatory Problems    Diagnosis Date Noted  . Multiple sclerosis ([place]) 05/29/2016  . Fibromyalgia 05/29/2016  . 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 Insomnia 05/29/2016  . Ulcerative pancolitis without complication ([place]) 40/97/3532  . Bipolar 2 disorder ([place]) 05/29/2016  . Current smoker 05/29/2016  . Primary osteoarthritis of right knee 06/01/2016  . Anxiety state 06/01/2016  . Left shoulder pain 06/01/2016  . GAD (generalized anxiety disorder)  07/20/2016  . History of pulmonary embolus (PE) 07/21/2016  . Right elbow pain 07/21/2016  . Sinusitis 11/30/2016  . Hot flashes 12/29/2016  . Body aches 12/29/2016  . Palpitations 03/08/2017  . Chest tightness 03/08/2017  . Tachycardia 03/08/2017  . Sinus tachycardia 03/12/2017  . PAC (premature atrial contraction) 03/12/2017  . Lumbar degenerative disc disease 06/02/2017  . Plantar fasciitis, bilateral 10/29/2017   Resolved Ambulatory Problems    Diagnosis Date Noted  . Right lateral epicondylitis 07/21/2016  . Right wrist injury 12/29/2016  . Itching 12/29/2016  . Hand injury, left, subsequent encounter 12/30/2016  . Chills (without fever) 03/08/2017   Past Medical History:  Diagnosis Date  . Anxiety   . Bipolar 2 disorder, major depressive episode ([place])   . Complication of anesthesia   . COPD (chronic obstructive pulmonary disease) ([place])   . Fibromyalgia   . Insomnia   . Multiple sclerosis ([place])   . PONV (postoperative nausea and vomiting)   . Pulmonary embolism ([place]) 2000  . Sleep apnea   . Ulcerative colitis ([place])      -   Review of Systems  Constitutional: Positive for chills, fatigue and fever.  HENT: Positive for congestion, ear pain, sinus pressure, sinus pain and sore throat. Negative for postnasal drip and rhinorrhea.   Respiratory: Positive for cough. Negative for shortness of breath.   Neurological: Positive for headaches.       Objective:   Physical Exam  Constitutional: She  is oriented to person, place, and time. She appears well-developed and well-nourished.  HENT:  Head: Normocephalic and atraumatic.  Right Ear: There is tenderness. Tympanic membrane is scarred.  Left Ear: Tympanic membrane and external ear normal.  Nose: Right sinus exhibits maxillary sinus tenderness.  Mouth/Throat: No oropharyngeal exudate.  Right Tympanic membrane is healing with some visible light reflex. Appears to be closed. Some cerumen visible in the right ear.   Cardiovascular: Normal rate, regular rhythm and normal heart sounds. Exam reveals no gallop and no friction rub.  No murmur heard. Pulmonary/Chest: Effort normal and breath sounds normal. She has no wheezes.  Neurological: She is alert and oriented to person, place, and time. She has normal reflexes.  Psychiatric: She has a normal mood and affect. Her behavior is normal.    Vitals:   11/22/17 0925  BP: 116/69  Pulse: 79       Assessment & Plan:   Diagnoses and all orders for this visit:  Class 1 obesity due to excess calories without serious comorbidity with body mass index (BMI) of 30.0 to 30.9 in adult -     phentermine (ADIPEX-P) 37.5 MG tablet; Take 1 tablet (37.5 mg total) by mouth daily before breakfast.  Recurrent sinusitis -     doxycycline (VIBRA-TABS) 100 MG tablet; Take 1 tablet (100 mg total) by mouth 2 (two) times daily. For 10 days. -     Ambulatory referral to ENT  Nasal congestion -     Ambulatory referral to ENT  History of sinus surgery -     Ambulatory referral to ENT  Cough -     benzonatate (TESSALON) 200 MG capsule; Take 1 capsule (200 mg total) by mouth 2 (two) times daily as needed for cough.     Pt was educated on her sinusitis and possible cause of weight gain.   She can use tessalon to help relieve cough and doxycycline to address recurrent sinusitis. She has been given referral to ENT to see if they can help resolve this recurrent problem. Reassurance given TM is healing. 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  Discussed weight in detail. She should follow up with psychiatrist to see if medication changes can be made to put her on meds that would be less likely to cause weight gain. Discussed SE"s of phentermine. She has been on this before and  aware this is no a long term drug solution. Printed out information on long term weight loss drugs to consider.    If her condition worsens or she has any questions she was informed she could call the office or ask a question through [facility].   Marland Kitchen.Spent 30 minutes with patient and greater than 50 percent of visit spent counseling patient regarding treatment plan.

## 2017-11-22 NOTE — Patient Instructions (Addendum)
belviq  saxenda qsymia  Will make referral to ENT.   Follow up with psych.

## 2017-11-26 ENCOUNTER — Telehealth: Payer: Self-pay | Admitting: Physician Assistant

## 2017-11-26 DIAGNOSIS — G35 Multiple sclerosis: Secondary | ICD-10-CM | POA: Diagnosis not present

## 2017-11-26 MED ORDER — ALPRAZOLAM 0.5 MG PO TABS: 0.5000 mg | ORAL_TABLET | Freq: Two times a day (BID) | ORAL | 5 refills | Status: DC | PRN

## 2017-11-26 NOTE — Telephone Encounter (Signed)
Patient Called and requested her Paige Lozano be called in before the weekend.

## 2017-11-26 NOTE — Telephone Encounter (Signed)
Done

## 2017-11-30 DIAGNOSIS — S62235D Other nondisplaced fracture of base of first metacarpal bone, left hand, subsequent encounter for fracture with routine healing: Secondary | ICD-10-CM | POA: Diagnosis not present

## 2017-12-02 DIAGNOSIS — G603 Idiopathic progressive neuropathy: Secondary | ICD-10-CM | POA: Diagnosis not present

## 2017-12-02 DIAGNOSIS — R5383 Other fatigue: Secondary | ICD-10-CM | POA: Diagnosis not present

## 2017-12-02 DIAGNOSIS — Z79899 Other long term (current) drug therapy: Secondary | ICD-10-CM | POA: Diagnosis not present

## 2017-12-02 DIAGNOSIS — G35 Multiple sclerosis: Secondary | ICD-10-CM | POA: Diagnosis not present

## 2017-12-02 DIAGNOSIS — R2681 Unsteadiness on feet: Secondary | ICD-10-CM | POA: Diagnosis not present

## 2017-12-08 ENCOUNTER — Other Ambulatory Visit: Payer: Self-pay | Admitting: Family Medicine

## 2017-12-10 ENCOUNTER — Ambulatory Visit: Payer: Self-pay

## 2017-12-15 ENCOUNTER — Other Ambulatory Visit: Payer: Self-pay

## 2017-12-15 ENCOUNTER — Ambulatory Visit (INDEPENDENT_AMBULATORY_CARE_PROVIDER_SITE_OTHER): Payer: Medicare Other | Admitting: Psychiatry

## 2017-12-15 ENCOUNTER — Encounter (HOSPITAL_COMMUNITY): Payer: Self-pay | Admitting: Psychiatry

## 2017-12-15 VITALS — BP 124/86 | HR 107 | Ht 68.0 in | Wt 199.0 lb

## 2017-12-15 DIAGNOSIS — F3181 Bipolar II disorder: Secondary | ICD-10-CM | POA: Diagnosis not present

## 2017-12-15 DIAGNOSIS — M791 Myalgia, unspecified site: Secondary | ICD-10-CM | POA: Diagnosis not present

## 2017-12-15 DIAGNOSIS — F411 Generalized anxiety disorder: Secondary | ICD-10-CM | POA: Diagnosis not present

## 2017-12-15 DIAGNOSIS — M797 Fibromyalgia: Secondary | ICD-10-CM

## 2017-12-15 DIAGNOSIS — F5102 Adjustment insomnia: Secondary | ICD-10-CM

## 2017-12-15 DIAGNOSIS — Z87891 Personal history of nicotine dependence: Secondary | ICD-10-CM

## 2017-12-15 DIAGNOSIS — M2550 Pain in unspecified joint: Secondary | ICD-10-CM

## 2017-12-15 DIAGNOSIS — F4310 Post-traumatic stress disorder, unspecified: Secondary | ICD-10-CM | POA: Diagnosis not present

## 2017-12-15 MED ORDER — LAMOTRIGINE 25 MG PO TABS: 25.0000 mg | ORAL_TABLET | Freq: Every day | ORAL | 0 refills | Status: DC

## 2017-12-15 NOTE — Progress Notes (Signed)
[facility] Outpatient Follow up visit   Patient Identification: Paige Lozano MRN:  [id number] Date of Evaluation:  12/15/2017 Referral Source: [facility] Starch Primary care Chief Complaint:   Chief Complaint    Follow-up; Other     Visit Diagnosis:    ICD-10-CM   1. Bipolar 2 disorder (HCC) F31.81   2. PTSD (post-traumatic stress disorder) F43.10   3. GAD (generalized anxiety disorder) F41.1   4. Adjustment insomnia F51.02   5. Fibromyalgia M79.7     History of Present Illness:  52 years old Returns for follow-up and medication management  Patient feels stable. Has boyfriend.  Boyfriend here . Ive asked her to sign release of info at the front Some mood swings and down days.  phenteramine started . Has been feeling irritable. Advised to stop for now Concern of weight gain talked about olanzapine and to change Talked about lamictal  For arthritis, Multiple sclerosis.    Anxiety: not worse. On prn xanax  Aggravating factor: surgeries . Multiple medical . surgeries Modifying factor: dogs      Previous Psychotropic Medications: Yes   Substance Abuse History in the last 12 months:  Yes.   as per history Marijuana says it is medical for her condition.  Consequences of Substance Abuse: Medical Consequences:  fatigue, poor concenctration  Past Medical History:  Past Medical History:  Diagnosis Date  . Anxiety   . Bipolar 2 disorder, major depressive episode ([place])   . Complication of anesthesia   . COPD (chronic obstructive pulmonary disease) ([place])    smoker  . Fibromyalgia   . Insomnia   . Multiple sclerosis ([place])   . PONV (postoperative nausea and vomiting)   . Pulmonary embolism ([place]) 2000  . Sleep apnea    mild OSA no CPAP  . Ulcerative colitis [facility])     Past Surgical History:  Procedure Laterality Date  . FOOT SURGERY Left    bone spur  . HEMORRHOID SURGERY    . HUMERUS FRACTURE SURGERY Left   . MYOMECTOMY    . NASAL SINUS SURGERY    . SHOULDER ARTHROSCOPY WITH  SUBACROMIAL DECOMPRESSION AND BICEP TENDON REPAIR Left 07/27/2016   Procedure: SHOULDER ARTHROSCOPY DEBRIDEMENT ROTATOR CUFF AND LABRUM, SUBACROMIAL DECOMPRESSION, BICEPS TENODESIS;  Surgeon: Tania Ade, MD;  Location: [facility];  Service: Orthopedics;  Laterality: Left;  SHOULDER ARTHROSCOPY DEBRIDEMENT ROTATOR CUFF AND LABRUM, SUBACROMIAL DECOMPRESSION, BICEPS TENOTOMY, POSSIBLE TENODESIS  . THUMB ARTHROSCOPY     5 surgeries on left thumb, 4 surgeries on right  . TONSILLECTOMY AND ADENOIDECTOMY       Family History:  Family History  Adopted: Yes    Social History:   Social History   Socioeconomic History  . Marital status: Divorced    Spouse name: None  . Number of children: None  . Years of education: None  . Highest education level: None  Social Needs  . Financial resource strain: None  . Food insecurity - worry: None  . Food insecurity - inability: None  . Transportation needs - medical: None  . Transportation needs - non-medical: None  Occupational History  . None  Tobacco Use  . Smoking status: Former Smoker    Packs/day: 0.25    Types: Cigarettes    Last attempt to quit: 07/08/2016    Years since quitting: 1.4  . Smokeless tobacco: Never Used  Substance and Sexual Activity  . Alcohol use: Yes    Alcohol/week: 0.0 oz    Comment: rare   . Drug  use: No    Comment: last use 5 month ago  . Sexual activity: Yes    Partners: Male    Birth control/protection: None  Other Topics Concern  . None  Social History Narrative  . None     Allergies:   Allergies  Allergen Reactions  . Ketamine Anaphylaxis  . Oxycodone Diarrhea  . Papaya Enzyme Anaphylaxis    Metabolic Disorder Labs: No results found for: HGBA1C, MPG No results found for: PROLACTIN No results found for: CHOL, TRIG, HDL, CHOLHDL, VLDL, LDLCALC   Current Medications: Current Outpatient Medications  Medication Sig Dispense Refill  . acetaminophen (TYLENOL) 650 MG CR tablet  Take 1 tablet (650 mg total) by mouth every 8 (eight) hours as needed for pain. 90 tablet 3  . albuterol (PROAIR HFA) 108 (90 Base) MCG/ACT inhaler Inhale 2 puffs into the lungs every 6 (six) hours as needed.    . ALPRAZolam (XANAX) 0.5 MG tablet Take 1 tablet (0.5 mg total) by mouth 2 (two) times daily as needed. for anxiety 45 tablet 5  . balsalazide (COLAZAL) 750 MG capsule TAKE 3 CAPSULES (2,250 MG TOTAL) BY MOUTH 3 (THREE) TIMES DAILY. 270 capsule 0  . fluticasone (FLONASE) 50 MCG/ACT nasal spray Place 2 sprays into both nostrils daily. 16 g 3  . Natalizumab (TYSABRI IV) Inject into the vein.    . phentermine (ADIPEX-P) 37.5 MG tablet Take 1 tablet (37.5 mg total) by mouth daily before breakfast. 60 tablet 0  . zolpidem (AMBIEN) 10 MG tablet Take 1 tablet (10 mg total) by mouth at bedtime. MUST make appointment for future refills. 30 tablet 0  . benzonatate (TESSALON) 200 MG capsule Take 1 capsule (200 mg total) by mouth 2 (two) times daily as needed for cough. (Patient not taking: Reported on 12/15/2017) 20 capsule 0  . cyclobenzaprine (FLEXERIL) 10 MG tablet TAKE 1/2 (ONE-HALF) TABLET BY MOUTH AT BEDTIME, THEN  INCREASE  GRADUALLY  TO  1  TABLET  3  TIMES  DAILY (Patient not taking: Reported on 12/15/2017) 30 tablet 0  . doxycycline (VIBRA-TABS) 100 MG tablet Take 1 tablet (100 mg total) by mouth 2 (two) times daily. For 10 days. (Patient not taking: Reported on 12/15/2017) 20 tablet 0  . hydrOXYzine (ATARAX/VISTARIL) 25 MG tablet Take 1 tablet (25 mg total) by mouth 3 (three) times daily as needed for itching. (Patient not taking: Reported on 12/15/2017) 60 tablet 0  . lamoTRIgine (LAMICTAL) 25 MG tablet Take 1 tablet (25 mg total) by mouth daily. Take one tablet daily for a week and then start taking 2 tablets. 60 tablet 0   No current facility-administered medications for this visit.       Psychiatric Specialty Exam: Review of Systems  Cardiovascular: Negative for chest pain.   Musculoskeletal: Positive for joint pain and myalgias.  Skin: Negative for rash.  Neurological: Negative for tremors and headaches.  Psychiatric/Behavioral: Negative for depression and suicidal ideas.    Blood pressure 124/86, pulse (!) 107, height 5\' 8"  (1.727 m), weight 199 lb (90.3 kg).Body mass index is 30.26 kg/m.  General Appearance: Casual  Eye Contact:  Fair  Speech:  Normal Rate  Volume:  normal  Mood: subdued  Affect:  congruent  Thought Process:  Goal Directed  Orientation:  Full (Time, Place, and Person)  Thought Content:  Logical  Suicidal Thoughts:  No  Homicidal Thoughts:  No  Memory:  Immediate;   Fair Recent;   Fair  Judgement:  Fair  Insight:  Fair  Psychomotor Activity:  Normal  Concentration:  Concentration: Fair and Attention Span: Fair  Recall:  [employer] of Knowledge:Fair  Language: Fair  Akathisia:  Negative  Handed:  Right  AIMS (if indicated):    Assets:  Desire for Improvement  ADL's:  Intact  Cognition: WNL  Sleep:  Fair while on meds    Treatment Plan Summary: Medication management and Plan as follows  Bipolar 2:  Feeling irritable and mood or down. Advised to stop phenteramine for now. Will also stop olanzapine and change to lamictal as her concern of weight gain GAD: not worse. Continue xanax prn Insomnia: did not endorse today Fu 1 m. Reviewed concerns and lamictal started.    Merian Capron, MD 2/6/201910:17 AM

## 2017-12-16 DIAGNOSIS — G894 Chronic pain syndrome: Secondary | ICD-10-CM | POA: Diagnosis not present

## 2017-12-16 DIAGNOSIS — M47816 Spondylosis without myelopathy or radiculopathy, lumbar region: Secondary | ICD-10-CM | POA: Diagnosis not present

## 2017-12-16 DIAGNOSIS — M5136 Other intervertebral disc degeneration, lumbar region: Secondary | ICD-10-CM | POA: Diagnosis not present

## 2017-12-24 DIAGNOSIS — K5190 Ulcerative colitis, unspecified, without complications: Secondary | ICD-10-CM | POA: Diagnosis not present

## 2017-12-24 LAB — HM COLONOSCOPY

## 2017-12-27 DIAGNOSIS — G35 Multiple sclerosis: Secondary | ICD-10-CM | POA: Diagnosis not present

## 2017-12-28 ENCOUNTER — Telehealth (HOSPITAL_COMMUNITY): Payer: Self-pay | Admitting: Psychiatry

## 2017-12-28 DIAGNOSIS — M47816 Spondylosis without myelopathy or radiculopathy, lumbar region: Secondary | ICD-10-CM | POA: Diagnosis not present

## 2017-12-28 NOTE — Telephone Encounter (Signed)
Pt is having an issue with insurance and getting medications filled.  Pt was on olanzapine and we switched it to lamictal. Pt can not afford lamictal out of pocket and would like to know if we have cupons or anything to help with it. If not she would need a different rx sent for olanzapine.  Pt is going out of town on Friday.   [facility] [address]   CB # [phone]

## 2017-12-30 ENCOUNTER — Telehealth: Payer: Self-pay | Admitting: Family Medicine

## 2017-12-30 MED ORDER — ZOLPIDEM TARTRATE 10 MG PO TABS: 10.0000 mg | ORAL_TABLET | Freq: Every day | ORAL | 0 refills | Status: DC

## 2017-12-30 MED ORDER — ALPRAZOLAM 0.5 MG PO TABS: 0.5000 mg | ORAL_TABLET | Freq: Two times a day (BID) | ORAL | 5 refills | Status: DC | PRN

## 2017-12-30 NOTE — Telephone Encounter (Signed)
Left VM informing patient that we do not have coupons and to call me back letting me know exactly what's going on with the Lamictal rx. I also explained to her what Dr. De Nurse said in previous message.

## 2017-12-30 NOTE — Telephone Encounter (Signed)
Done

## 2017-12-30 NOTE — Telephone Encounter (Signed)
Pt called and stated she is needing a refill on her Ambien and xanax today because she is going out of the country first thing in the morning and needs these medications. Luvenia Starch is not here to sign off on them. Thanks

## 2017-12-30 NOTE — Telephone Encounter (Signed)
Spoke with patient and she stated she will take the Olanzapine until she gets back from her trip and will worry about Lamictal at that time. Nothing further is needed at this time.

## 2017-12-30 NOTE — Telephone Encounter (Signed)
Pt returned call. She needs a call back at work number at [phone]. Just ask for her.

## 2017-12-30 NOTE — Telephone Encounter (Signed)
We dont have coupon Crystal can call pharmaceutical company if they offer any concession lamictal is generic it should not be that expensive Otherwise if patient willing can change back to last dose of olanzapine and sent

## 2018-01-11 ENCOUNTER — Encounter: Payer: Self-pay | Admitting: Physician Assistant

## 2018-01-11 ENCOUNTER — Telehealth (HOSPITAL_COMMUNITY): Payer: Self-pay

## 2018-01-11 ENCOUNTER — Other Ambulatory Visit (HOSPITAL_COMMUNITY): Payer: Self-pay

## 2018-01-11 MED ORDER — LAMOTRIGINE 25 MG PO TABS: 25.0000 mg | ORAL_TABLET | Freq: Every day | ORAL | 0 refills | Status: DC

## 2018-01-11 NOTE — Telephone Encounter (Signed)
Pharmacy sent over fax requesting a 90 day supply on Lamictal 25mg  so that insurance will pay for it. Resent medication for a 90 day supply per Dr. De Nurse. Nothing further is needed at this.

## 2018-01-12 ENCOUNTER — Ambulatory Visit (HOSPITAL_COMMUNITY): Payer: Self-pay | Admitting: Psychiatry

## 2018-01-19 DIAGNOSIS — S62235D Other nondisplaced fracture of base of first metacarpal bone, left hand, subsequent encounter for fracture with routine healing: Secondary | ICD-10-CM | POA: Diagnosis not present

## 2018-01-19 DIAGNOSIS — M189 Osteoarthritis of first carpometacarpal joint, unspecified: Secondary | ICD-10-CM | POA: Diagnosis not present

## 2018-01-19 DIAGNOSIS — Z888 Allergy status to other drugs, medicaments and biological substances status: Secondary | ICD-10-CM | POA: Diagnosis not present

## 2018-01-19 DIAGNOSIS — M180 Bilateral primary osteoarthritis of first carpometacarpal joints: Secondary | ICD-10-CM | POA: Diagnosis not present

## 2018-01-21 DIAGNOSIS — M5136 Other intervertebral disc degeneration, lumbar region: Secondary | ICD-10-CM | POA: Diagnosis not present

## 2018-01-21 DIAGNOSIS — G894 Chronic pain syndrome: Secondary | ICD-10-CM | POA: Diagnosis not present

## 2018-01-21 DIAGNOSIS — M47817 Spondylosis without myelopathy or radiculopathy, lumbosacral region: Secondary | ICD-10-CM | POA: Diagnosis not present

## 2018-01-21 DIAGNOSIS — M791 Myalgia, unspecified site: Secondary | ICD-10-CM | POA: Diagnosis not present

## 2018-01-31 DIAGNOSIS — G35 Multiple sclerosis: Secondary | ICD-10-CM | POA: Diagnosis not present

## 2018-02-01 ENCOUNTER — Other Ambulatory Visit: Payer: Self-pay | Admitting: Sports Medicine

## 2018-02-01 ENCOUNTER — Other Ambulatory Visit (HOSPITAL_COMMUNITY): Payer: Self-pay | Admitting: Psychiatry

## 2018-02-01 MED ORDER — OLANZAPINE 2.5 MG PO TABS: 2.5000 mg | ORAL_TABLET | Freq: Every day | ORAL | 0 refills | Status: DC

## 2018-02-01 NOTE — Telephone Encounter (Signed)
I spoke with patient and she states that her Part D medicare will pay for the Lamictal and charge her a reasonable price. She wants to stick with Lamictal and not the Olanzapine. Nothing further is needed at this time.

## 2018-02-01 NOTE — Telephone Encounter (Signed)
Can send 

## 2018-02-01 NOTE — Telephone Encounter (Signed)
Pt needs refill on olanzapine for a 74m supply. She is still tyring to get her insurance problem worked out.   [facility] [address]

## 2018-02-01 NOTE — Telephone Encounter (Signed)
To PCP

## 2018-02-17 ENCOUNTER — Encounter: Payer: Self-pay | Admitting: Family Medicine

## 2018-02-17 ENCOUNTER — Ambulatory Visit (INDEPENDENT_AMBULATORY_CARE_PROVIDER_SITE_OTHER): Payer: Medicare Other | Admitting: Family Medicine

## 2018-02-17 VITALS — BP 128/83 | HR 102 | Temp 98.4°F | Wt 195.6 lb

## 2018-02-17 DIAGNOSIS — J441 Chronic obstructive pulmonary disease with (acute) exacerbation: Secondary | ICD-10-CM

## 2018-02-17 DIAGNOSIS — B351 Tinea unguium: Secondary | ICD-10-CM

## 2018-02-17 MED ORDER — GUAIFENESIN-CODEINE 100-10 MG/5ML PO SOLN: 5.0000 mL | Freq: Every evening | ORAL | 0 refills | Status: DC | PRN

## 2018-02-17 MED ORDER — TERBINAFINE HCL 250 MG PO TABS: 250.0000 mg | ORAL_TABLET | Freq: Every day | ORAL | 0 refills | Status: DC

## 2018-02-17 MED ORDER — CEFDINIR 300 MG PO CAPS: 300.0000 mg | ORAL_CAPSULE | Freq: Two times a day (BID) | ORAL | 0 refills | Status: DC

## 2018-02-17 MED ORDER — ALBUTEROL SULFATE HFA 108 (90 BASE) MCG/ACT IN AERS: 2.0000 | INHALATION_SPRAY | Freq: Four times a day (QID) | RESPIRATORY_TRACT | 0 refills | Status: DC | PRN

## 2018-02-17 MED ORDER — PREDNISONE 10 MG PO TABS: 30.0000 mg | ORAL_TABLET | Freq: Every day | ORAL | 0 refills | Status: DC

## 2018-02-17 NOTE — Progress Notes (Signed)
Paige Lozano is a 52 y.o. female who presents to [facility] [place] today for cough congestion nasal discharge hoarse voice.  Symptoms present for about a week.  Patient notes some wheezing and chest tightness.  She notes she has had a subjective fever and chills at home for about a week as well.  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  She is tried NyQuil and DayQuil which have helped a little.  No vomiting or diarrhea.  She is a former smoker.  She has a history of mild COPD.  Additionally she notes toenail fungus of her bilateral. Great toenail. She denies any pain.   Past Medical History:  Diagnosis Date  . Anxiety   . Bipolar 2 disorder, major depressive episode ([place])   . Complication of anesthesia   . COPD (chronic obstructive pulmonary disease) ([place])    smoker  . Fibromyalgia   . Insomnia   . Multiple sclerosis ([place])   . PONV (postoperative nausea and vomiting)   . Pulmonary embolism ([place]) 2000  . Sleep apnea    mild OSA no CPAP  . Ulcerative colitis [facility])    Past Surgical History:  Procedure Laterality Date  . FOOT SURGERY Left    bone spur  . HEMORRHOID SURGERY    . HUMERUS FRACTURE SURGERY Left   . MYOMECTOMY    . NASAL SINUS SURGERY    . SHOULDER ARTHROSCOPY WITH SUBACROMIAL DECOMPRESSION AND BICEP TENDON REPAIR Left 07/27/2016   Procedure: SHOULDER ARTHROSCOPY DEBRIDEMENT ROTATOR CUFF AND LABRUM, SUBACROMIAL DECOMPRESSION, BICEPS TENODESIS;  Surgeon: Tania Ade, MD;  Location: [place] [facility];  Service: Orthopedics;  Laterality: Left;  SHOULDER ARTHROSCOPY DEBRIDEMENT ROTATOR CUFF AND LABRUM, SUBACROMIAL DECOMPRESSION, BICEPS TENOTOMY, POSSIBLE TENODESIS  . THUMB ARTHROSCOPY     5 surgeries on left thumb, 4 surgeries on right  .  TONSILLECTOMY AND ADENOIDECTOMY     Social History   Tobacco Use  . Smoking status: Former Smoker    Packs/day: 0.25    Types: Cigarettes    Last attempt to quit: 07/08/2016    Years since quitting: 1.6  . Smokeless tobacco: Never Used  Substance Use Topics  . Alcohol use: Yes    Alcohol/week: 0.0 oz    Comment: rare    family history is not on file. She was adopted.  ROS as above:  Medications: Current Outpatient Medications  Medication Sig Dispense Refill  . acetaminophen (TYLENOL) 650 MG CR tablet Take 1 tablet (650 mg total) by mouth every 8 (eight) hours as needed for pain. 90 tablet 3  . ALPRAZolam (XANAX) 0.5 MG tablet Take 1 tablet (0.5 mg total) by mouth 2 (two) times daily as needed. for anxiety 45 tablet 5  . balsalazide (COLAZAL) 750 MG capsule TAKE 3 CAPSULES (2,250 MG TOTAL) BY MOUTH 3 (THREE) TIMES DAILY. 270 capsule 0  . cyclobenzaprine (FLEXERIL) 10 MG tablet TAKE 1/2 (ONE-HALF) TABLET BY MOUTH AT BEDTIME, THEN  INCREASE  GRADUALLY  TO  1  TABLET  3  TIMES  DAILY 30 tablet 0  . fluticasone (FLONASE) 50 MCG/ACT nasal spray Place 2 sprays into both nostrils daily. 16 g 3  . lamoTRIgine (LAMICTAL) 25 MG tablet Take 1 tablet (25 mg total) by mouth daily. Take one tablet daily for a week and then start taking 2 tablets. 180 tablet  0  . Natalizumab (TYSABRI IV) Inject into the vein.    . phentermine (ADIPEX-P) 37.5 MG tablet Take 1 tablet (37.5 mg total) by mouth daily before breakfast. 60 tablet 0  . zolpidem (AMBIEN) 10 MG tablet TAKE 1 TABLET BY MOUTH EVERYDAY AT BEDTIME 30 tablet 3  . albuterol (PROVENTIL HFA;VENTOLIN HFA) 108 (90 Base) MCG/ACT inhaler Inhale 2 puffs into the lungs every 6 (six) hours as needed for wheezing or shortness of breath. 1 Inhaler 0  . cefdinir (OMNICEF) 300 MG capsule Take 1 capsule (300 mg total) by mouth 2 (two) times daily. 14 capsule 0  . guaiFENesin-codeine 100-10 MG/5ML syrup Take 5 mLs by mouth at bedtime as needed for cough. 120 mL 0   . hydrOXYzine (ATARAX/VISTARIL) 25 MG tablet Take 1 tablet (25 mg total) by mouth 3 (three) times daily as needed for itching. (Patient not taking: Reported on 12/15/2017) 60 tablet 0  . predniSONE (DELTASONE) 10 MG tablet Take 3 tablets (30 mg total) by mouth daily with breakfast. 15 tablet 0  . terbinafine (LAMISIL) 250 MG tablet Take 1 tablet (250 mg total) by mouth daily. 90 tablet 0   No current facility-administered medications for this visit.    Allergies  Allergen Reactions  . Ketamine Anaphylaxis  . Oxycodone Diarrhea  . Papaya Enzyme Anaphylaxis    Health Maintenance Health Maintenance  Topic Date Due  . HIV Screening  10/05/1981  . TETANUS/TDAP  10/05/1985  . PAP SMEAR  10/06/1987  . MAMMOGRAM  10/05/2016  . INFLUENZA VACCINE  09/09/2018 (Originally 06/09/2018)  . COLONOSCOPY  12/25/2027     Exam:  BP 128/83   Pulse (!) 102   Temp 98.4 F (36.9 C) (Oral)   Wt 195 lb 9.6 oz (88.7 kg)   BMI 29.74 kg/m  Gen: Well NAD HEENT: EOMI,  MMM inflamed nasal turbinates with clear discharge bilaterally.  Posterior pharynx with cobblestoning.  No cervical lymphadenopathy.  Nontender maxillary and frontal sinuses.  Lungs: Normal work of breathing.  Wheezing and slight prolonged respiratory phase present. Otherwise normal lung exam BL.  Heart: RRR no MRG Abd: NABS, Soft. Nondistended, Nontender Exts: Brisk capillary refill, warm and well perfused.  Toenails white and discolored great toenails BL consistent with onychomycosis     Chemistry      Component Value Date/Time   NA 137 03/17/2017 1403   K 4.7 03/17/2017 1403   CL 104 03/17/2017 1403   CO2 20 03/17/2017 1403   BUN 14 03/17/2017 1403   CREATININE 0.78 03/17/2017 1403      Component Value Date/Time   CALCIUM 9.4 03/17/2017 1403   ALKPHOS 86 03/17/2017 1403   AST 24 03/17/2017 1403   ALT 15 03/17/2017 1403   BILITOT 0.4 03/17/2017 1403      Assessment and Plan: 52 y.o. female with Cough and fever. Occurring  with wheezing and chest tightness in setting of history of COPD.  Likely COPD exacerbation.  Plan to treat with prednisone and Omnicef.  Continue albuterol as needed.  Cough suppression with codeine cough syrup.  Onychomycosis: Treatment with oral terbinafine.  Recheck metabolic panel in 3 months.   No orders of the defined types were placed in this encounter.  Meds ordered this encounter  Medications  . cefdinir (OMNICEF) 300 MG capsule    Sig: Take 1 capsule (300 mg total) by mouth 2 (two) times daily.    Dispense:  14 capsule    Refill:  0  . predniSONE (DELTASONE) 10 MG  tablet    Sig: Take 3 tablets (30 mg total) by mouth daily with breakfast.    Dispense:  15 tablet    Refill:  0  . guaiFENesin-codeine 100-10 MG/5ML syrup    Sig: Take 5 mLs by mouth at bedtime as needed for cough.    Dispense:  120 mL    Refill:  0  . albuterol (PROVENTIL HFA;VENTOLIN HFA) 108 (90 Base) MCG/ACT inhaler    Sig: Inhale 2 puffs into the lungs every 6 (six) hours as needed for wheezing or shortness of breath.    Dispense:  1 Inhaler    Refill:  0  . terbinafine (LAMISIL) 250 MG tablet    Sig: Take 1 tablet (250 mg total) by mouth daily.    Dispense:  90 tablet    Refill:  0     Discussed warning signs or symptoms. Please see discharge instructions. Patient expresses understanding.

## 2018-02-17 NOTE — Patient Instructions (Addendum)
Thank you for coming in today. Take prednisone daily.  Use omnicef antibiotic.  Use cough medicine as needed at night.   Use terbinafine daily for nail fungus.  Recheck labs every 3 months.    Acute Bronchitis, Adult Acute bronchitis is sudden (acute) swelling of the air tubes (bronchi) in the lungs. Acute bronchitis causes these tubes to fill with mucus, which can make it hard to breathe. It can also cause coughing or wheezing. In adults, acute bronchitis usually goes away within 2 weeks. A cough caused by bronchitis may last up to 3 weeks. Smoking, allergies, and asthma can make the condition worse. Repeated episodes of bronchitis may cause further lung problems, such as chronic obstructive pulmonary disease (COPD). What are the causes? This condition can be caused by germs and by substances that irritate the lungs, including:  Cold and flu viruses. This condition is most often caused by the same virus that causes a cold.  Bacteria.  Exposure to tobacco smoke, dust, fumes, and air pollution.  What increases the risk? This condition is more likely to develop in people who:  Have close contact with someone with acute bronchitis.  Are exposed to lung irritants, such as tobacco smoke, dust, fumes, and vapors.  Have a weak immune system.  Have a respiratory condition such as asthma.  What are the signs or symptoms? Symptoms of this condition include:  A cough.  Coughing up clear, yellow, or green mucus.  Wheezing.  Chest congestion.  Shortness of breath.  A fever.  Body aches.  Chills.  A sore throat.  How is this diagnosed? This condition is usually diagnosed with a physical exam. During the exam, your health care provider may order tests, such as chest X-rays, to rule out other conditions. He or she may also:  Test a sample of your mucus for bacterial infection.  Check the level of oxygen in your blood. This is done to check for pneumonia.  Do a chest X-ray  or lung function testing to rule out pneumonia and other conditions.  Perform blood tests.  Your health care provider will also ask about your symptoms and medical history. How is this treated? Most cases of acute bronchitis clear up over time without treatment. Your health care provider may recommend:  Drinking more fluids. Drinking more makes your mucus thinner, which may make it easier to breathe.  Taking a medicine for a fever or cough.  Taking an antibiotic medicine.  Using an inhaler to help improve shortness of breath and to control a cough.  Using a cool mist vaporizer or humidifier to make it easier to breathe.  Follow these instructions at home: Medicines  Take over-the-counter and prescription medicines only as told by your health care provider.  If you were prescribed an antibiotic, take it as told by your health care provider. Do not stop taking the antibiotic even if you start to feel better. General instructions  Get plenty of rest.  Drink enough fluids to keep your urine clear or pale yellow.  Avoid smoking and secondhand smoke. Exposure to cigarette smoke or irritating chemicals will make bronchitis worse. If you smoke and you need help quitting, ask your health care provider. Quitting smoking will help your lungs heal faster.  Use an inhaler, cool mist vaporizer, or humidifier as told by your health care provider.  Keep all follow-up visits as told by your health care provider. This is important. How is this prevented? To lower your risk of getting this condition  again:  Wash your hands often with soap and water. If soap and water are not available, use hand sanitizer.  Avoid contact with people who have cold symptoms.  Try not to touch your hands to your mouth, nose, or eyes.  Make sure to get the flu shot every year.  Contact a health care provider if:  Your symptoms do not improve in 2 weeks of treatment. Get help right away if:  You cough up  blood.  You have chest pain.  You have severe shortness of breath.  You become dehydrated.  You faint or keep feeling like you are going to faint.  You keep vomiting.  You have a severe headache.  Your fever or chills gets worse. This information is not intended to replace advice given to you by your health care provider. Make sure you discuss any questions you have with your health care provider. Document Released: 12/03/2004 Document Revised: 05/20/2016 Document Reviewed: 04/15/2016 Elsevier Interactive Patient Education  [employer].

## 2018-02-18 DIAGNOSIS — M791 Myalgia, unspecified site: Secondary | ICD-10-CM | POA: Diagnosis not present

## 2018-02-18 DIAGNOSIS — M47816 Spondylosis without myelopathy or radiculopathy, lumbar region: Secondary | ICD-10-CM | POA: Diagnosis not present

## 2018-02-18 DIAGNOSIS — M5136 Other intervertebral disc degeneration, lumbar region: Secondary | ICD-10-CM | POA: Diagnosis not present

## 2018-02-24 ENCOUNTER — Telehealth: Payer: Self-pay | Admitting: Physician Assistant

## 2018-02-24 MED ORDER — FLUCONAZOLE 150 MG PO TABS: 150.0000 mg | ORAL_TABLET | Freq: Once | ORAL | 0 refills | Status: AC

## 2018-02-24 NOTE — Telephone Encounter (Signed)
Medication sent and patient advised. 

## 2018-02-24 NOTE — Telephone Encounter (Signed)
Ok to send diflucan 150mg  once and repeat in 48 hours if symptoms persist. #2.

## 2018-02-24 NOTE — Telephone Encounter (Signed)
Pt came in and saw Dr.Corey last week and got an antibiotic and she forgot to ask for a medication for yeast infection because antibiotics cause her to get them. She is wanting to know if something can be sent to [facility] on [address]. Thanks

## 2018-02-28 DIAGNOSIS — G35 Multiple sclerosis: Secondary | ICD-10-CM | POA: Diagnosis not present

## 2018-03-13 ENCOUNTER — Other Ambulatory Visit: Payer: Self-pay | Admitting: Family Medicine

## 2018-03-15 ENCOUNTER — Encounter (HOSPITAL_COMMUNITY): Payer: Self-pay | Admitting: Psychiatry

## 2018-03-15 ENCOUNTER — Ambulatory Visit (INDEPENDENT_AMBULATORY_CARE_PROVIDER_SITE_OTHER): Payer: Medicare Other | Admitting: Psychiatry

## 2018-03-15 VITALS — BP 124/82 | HR 74 | Ht 68.0 in | Wt 193.0 lb

## 2018-03-15 DIAGNOSIS — M1990 Unspecified osteoarthritis, unspecified site: Secondary | ICD-10-CM | POA: Diagnosis not present

## 2018-03-15 DIAGNOSIS — F411 Generalized anxiety disorder: Secondary | ICD-10-CM | POA: Diagnosis not present

## 2018-03-15 DIAGNOSIS — F5102 Adjustment insomnia: Secondary | ICD-10-CM

## 2018-03-15 DIAGNOSIS — G35 Multiple sclerosis: Secondary | ICD-10-CM | POA: Diagnosis not present

## 2018-03-15 DIAGNOSIS — F4310 Post-traumatic stress disorder, unspecified: Secondary | ICD-10-CM | POA: Diagnosis not present

## 2018-03-15 DIAGNOSIS — F3181 Bipolar II disorder: Secondary | ICD-10-CM | POA: Diagnosis not present

## 2018-03-15 DIAGNOSIS — Z87891 Personal history of nicotine dependence: Secondary | ICD-10-CM | POA: Diagnosis not present

## 2018-03-15 NOTE — Progress Notes (Signed)
[facility] Outpatient Follow up visit   Patient Identification: Paige Lozano MRN:  [id number] Date of Evaluation:  03/15/2018 Referral Source: [facility] Primary [facility] Chief Complaint:   Chief Complaint    Follow-up; Other     Visit Diagnosis:    ICD-10-CM   1. Bipolar 2 disorder (HCC) F31.81   2. PTSD (post-traumatic stress disorder) F43.10   3. GAD (generalized anxiety disorder) F41.1   4. Adjustment insomnia F51.02     History of Present Illness:  51 years old Returns for follow-up and medication management  Last visit stopped olanzapine due to weight concern  Started lamictal and was supposed to increase to 50mg  in one week  Apparently she never increased. Was feeling agitated and manic, left BF lived with GF. One day later she read the label and realized had to increase it  She was also on prednisone last month for sinus that may have made her agitated and hyped up or manic like symptoms Now she is doing some better on 50mg  lamictal Weight has come down since she stopped olanzapine    For arthritis, multiple sclerosis   Anxiety: stressed recently has been taking xanax  Aggravating factor: surgeries . Multiple medical . surgeries Modifying factor: dogs No rash     Previous Psychotropic Medications: Yes   Substance Abuse History in the last 12 months:  Yes.   as per history Marijuana says it is medical for her condition.  Consequences of Substance Abuse: Medical Consequences:  fatigue, poor concenctration  Past Medical History:  Past Medical History:  Diagnosis Date  . Anxiety   . Bipolar 2 disorder, major depressive episode ([place])   . Complication of anesthesia   . COPD (chronic obstructive pulmonary disease) ([place])    smoker  . Fibromyalgia   . Insomnia   . Multiple sclerosis ([place])   . PONV (postoperative nausea and vomiting)   . Pulmonary embolism ([place]) 2000  . Sleep apnea    mild OSA no CPAP  . Ulcerative colitis [facility])     Past Surgical History:  Procedure  Laterality Date  . FOOT SURGERY Left    bone spur  . HEMORRHOID SURGERY    . HUMERUS FRACTURE SURGERY Left   . MYOMECTOMY    . NASAL SINUS SURGERY    . SHOULDER ARTHROSCOPY WITH SUBACROMIAL DECOMPRESSION AND BICEP TENDON REPAIR Left 07/27/2016   Procedure: SHOULDER ARTHROSCOPY DEBRIDEMENT ROTATOR CUFF AND LABRUM, SUBACROMIAL DECOMPRESSION, BICEPS TENODESIS;  Surgeon: Tania Ade, MD;  Location: [place];  Service: Orthopedics;  Laterality: Left;  SHOULDER ARTHROSCOPY DEBRIDEMENT ROTATOR CUFF AND LABRUM, SUBACROMIAL DECOMPRESSION, BICEPS TENOTOMY, POSSIBLE TENODESIS  . THUMB ARTHROSCOPY     5 surgeries on left thumb, 4 surgeries on right  . TONSILLECTOMY AND ADENOIDECTOMY       Family History:  Family History  Adopted: Yes    Social History:   Social History   Socioeconomic History  . Marital status: Divorced    Spouse name: Not on file  . Number of children: Not on file  . Years of education: Not on file  . Highest education level: Not on file  Occupational History  . Not on file  Social Needs  . Financial resource strain: Not on file  . Food insecurity:    Worry: Not on file    Inability: Not on file  . Transportation needs:    Medical: Not on file    Non-medical: Not on file  Tobacco Use  . Smoking status: Former Smoker  Packs/day: 0.25    Types: Cigarettes    Last attempt to quit: 07/08/2016    Years since quitting: 1.6  . Smokeless tobacco: Never Used  Substance and Sexual Activity  . Alcohol use: Yes    Alcohol/week: 0.0 oz    Comment: rare   . Drug use: No    Types: Marijuana    Comment: last use 5 month ago  . Sexual activity: Yes    Partners: Male    Birth control/protection: None  Lifestyle  . Physical activity:    Days per week: Not on file    Minutes per session: Not on file  . Stress: Not on file  Relationships  . Social connections:    Talks on phone: Not on file    Gets together: Not on file    Attends religious  service: Not on file    Active member of club or organization: Not on file    Attends meetings of clubs or organizations: Not on file    Relationship status: Not on file  Other Topics Concern  . Not on file  Social History Narrative  . Not on file     Allergies:   Allergies  Allergen Reactions  . Ketamine Anaphylaxis  . Oxycodone Diarrhea  . Papaya Enzyme Anaphylaxis    Metabolic Disorder Labs: No results found for: HGBA1C, MPG No results found for: PROLACTIN No results found for: CHOL, TRIG, HDL, CHOLHDL, VLDL, LDLCALC   Current Medications: Current Outpatient Medications  Medication Sig Dispense Refill  . acetaminophen (TYLENOL) 650 MG CR tablet Take 1 tablet (650 mg total) by mouth every 8 (eight) hours as needed for pain. 90 tablet 3  . albuterol (PROVENTIL HFA;VENTOLIN HFA) 108 (90 Base) MCG/ACT inhaler Inhale 2 puffs into the lungs every 6 (six) hours as needed for wheezing or shortness of breath. 1 Inhaler 0  . ALPRAZolam (XANAX) 0.5 MG tablet Take 1 tablet (0.5 mg total) by mouth 2 (two) times daily as needed. for anxiety 45 tablet 5  . balsalazide (COLAZAL) 750 MG capsule TAKE 3 CAPSULES (2,250 MG TOTAL) BY MOUTH 3 (THREE) TIMES DAILY. 270 capsule 0  . cefdinir (OMNICEF) 300 MG capsule Take 1 capsule (300 mg total) by mouth 2 (two) times daily. 14 capsule 0  . fluticasone (FLONASE) 50 MCG/ACT nasal spray Place 2 sprays into both nostrils daily. 16 g 3  . guaiFENesin-codeine 100-10 MG/5ML syrup Take 5 mLs by mouth at bedtime as needed for cough. 120 mL 0  . lamoTRIgine (LAMICTAL) 25 MG tablet Take 1 tablet (25 mg total) by mouth daily. Take one tablet daily for a week and then start taking 2 tablets. 180 tablet 0  . Natalizumab (TYSABRI IV) Inject into the vein.    . phentermine (ADIPEX-P) 37.5 MG tablet Take 1 tablet (37.5 mg total) by mouth daily before breakfast. 60 tablet 0  . terbinafine (LAMISIL) 250 MG tablet Take 1 tablet (250 mg total) by mouth daily. 90 tablet  0  . zolpidem (AMBIEN) 10 MG tablet TAKE 1 TABLET BY MOUTH EVERYDAY AT BEDTIME 30 tablet 3  . cyclobenzaprine (FLEXERIL) 10 MG tablet TAKE 1/2 (ONE-HALF) TABLET BY MOUTH AT BEDTIME, THEN  INCREASE  GRADUALLY  TO  1  TABLET  3  TIMES  DAILY (Patient not taking: Reported on 03/15/2018) 30 tablet 0  . hydrOXYzine (ATARAX/VISTARIL) 25 MG tablet Take 1 tablet (25 mg total) by mouth 3 (three) times daily as needed for itching. (Patient not taking: Reported on 12/15/2017)  60 tablet 0   No current facility-administered medications for this visit.       Psychiatric Specialty Exam: Review of Systems  Cardiovascular: Negative for chest pain.  Musculoskeletal: Positive for myalgias.  Skin: Negative for rash.  Neurological: Negative for tremors and headaches.  Psychiatric/Behavioral: Negative for depression and suicidal ideas.    Blood pressure 124/82, pulse 74, height 5\' 8"  (1.727 m), weight 193 lb (87.5 kg).Body mass index is 29.35 kg/m.  General Appearance: Casual  Eye Contact:  Fair  Speech:  Normal Rate  Volume:  normal  Mood: somewhat hyperverbal.   Affect:  congruent  Thought Process:  Goal Directed  Orientation:  Full (Time, Place, and Person)  Thought Content:  Logical  Suicidal Thoughts:  No  Homicidal Thoughts:  No  Memory:  Immediate;   Fair Recent;   Fair  Judgement:  Fair  Insight:  Fair  Psychomotor Activity:  Normal  Concentration:  Concentration: Fair and Attention Span: Fair  Recall:  [employer] of Knowledge:Fair  Language: Fair  Akathisia:  Negative  Handed:  Right  AIMS (if indicated):    Assets:  Desire for Improvement  ADL's:  Intact  Cognition: WNL  Sleep:  Fair while on meds    Treatment Plan Summary: Medication management and Plan as follows  Bipolar 2:  Feeling somewhat hyped . Increase lamictal to 75mg  and then 100mg  in one week. Call back in a week GAD: stressed recently . Taking xanax Insomnia: difficuult to sleep without ambine. Reviewed sleep  hygiene  avoid phenteramine or stimulants Fu 1 m. Reviewed concerns   Merian Capron, MD 5/7/20191:30 PM

## 2018-03-17 IMAGING — DX DG HAND COMPLETE 3+V*L*
3 series · 3 of 3 positions shown · non-contrast
Comparison: None.

CLINICAL DATA: Fell last night, first metacarpal redness and
swelling, history of previous surgery

EXAM:
LEFT HAND - COMPLETE 3+ VIEW

[hand obl]
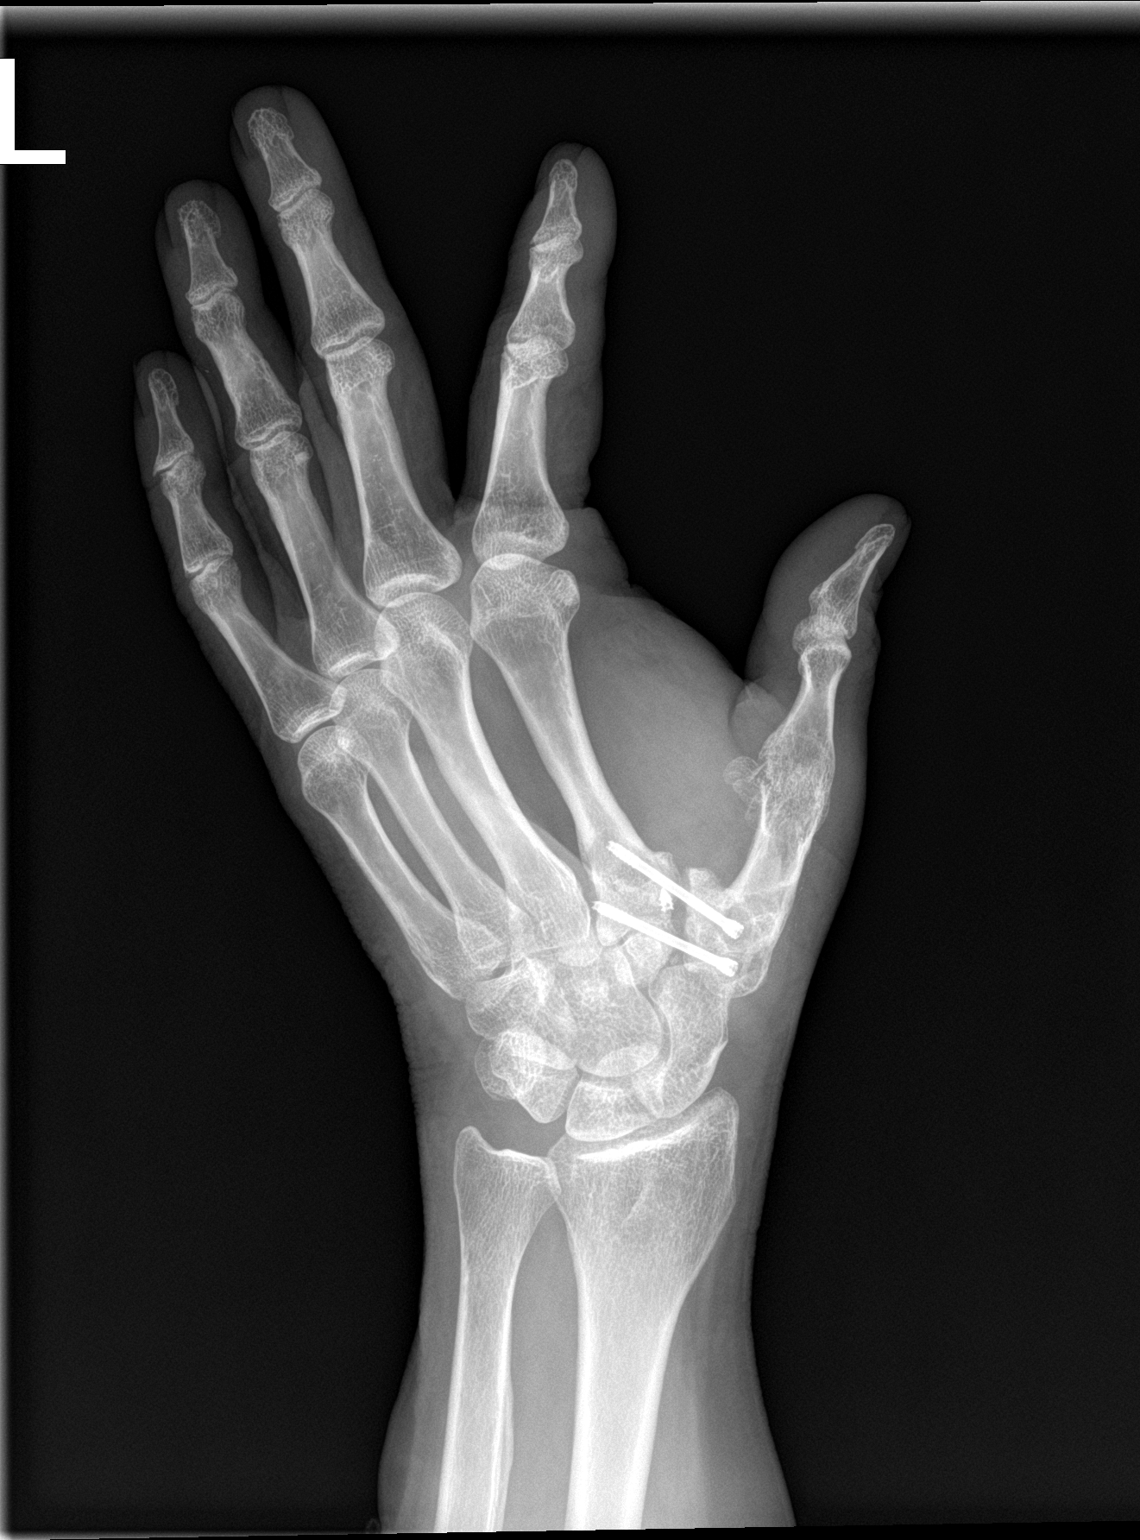

[hand lat]
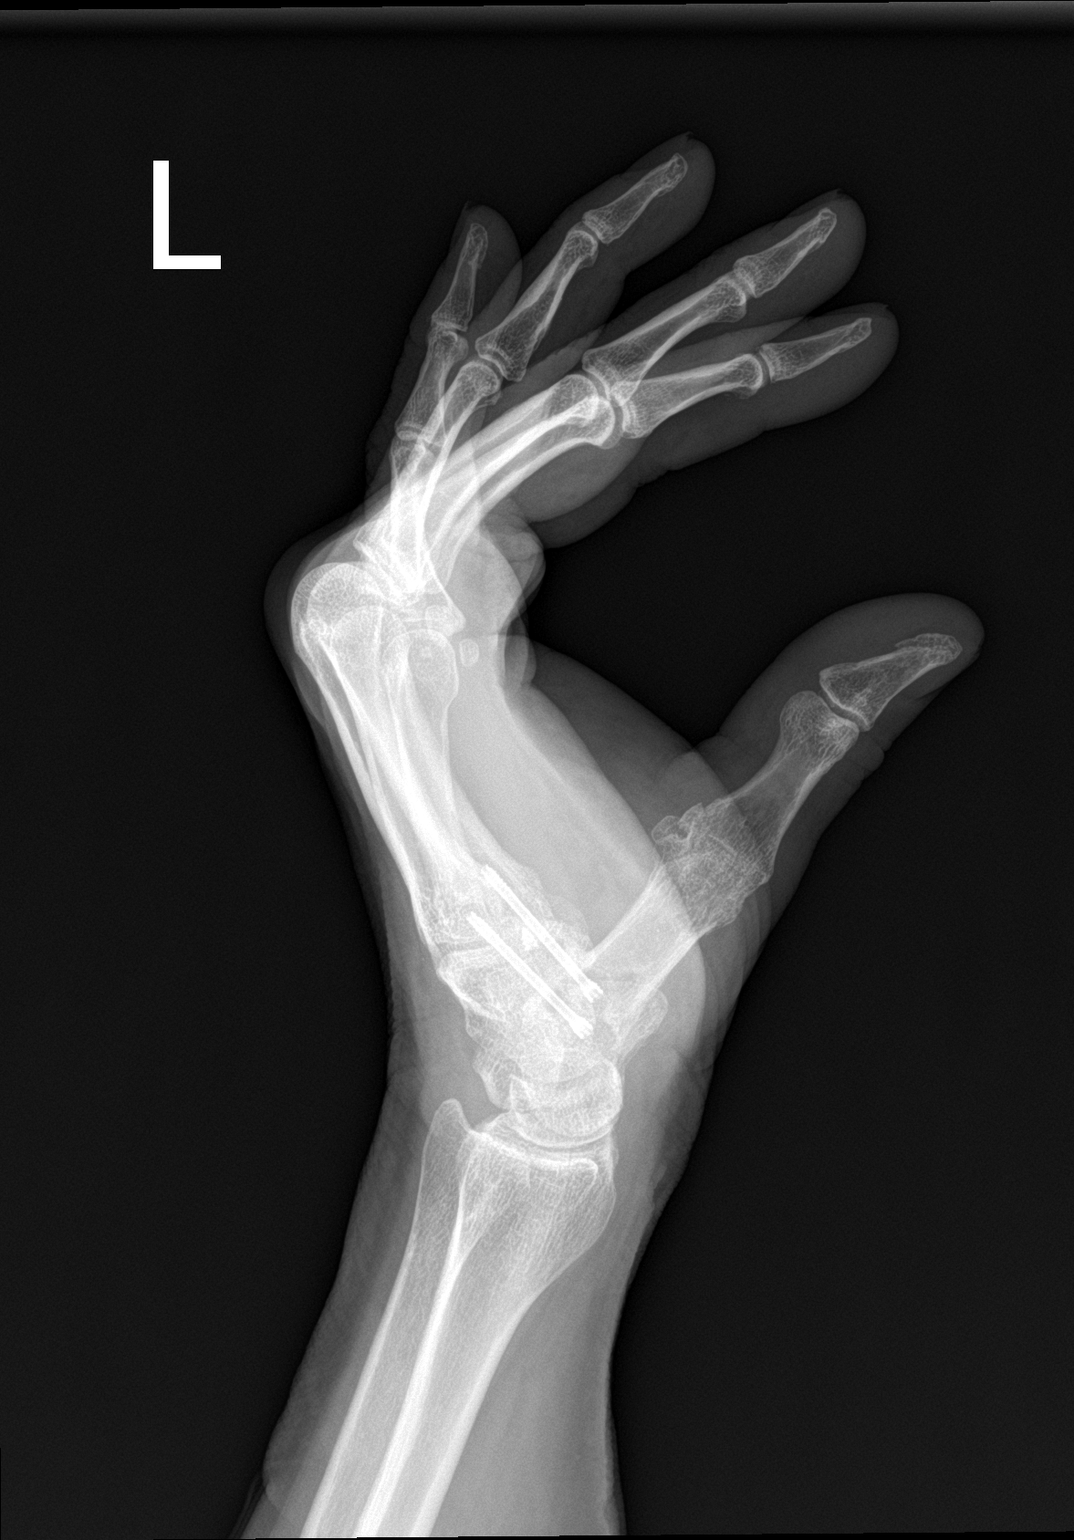

[hand pa]
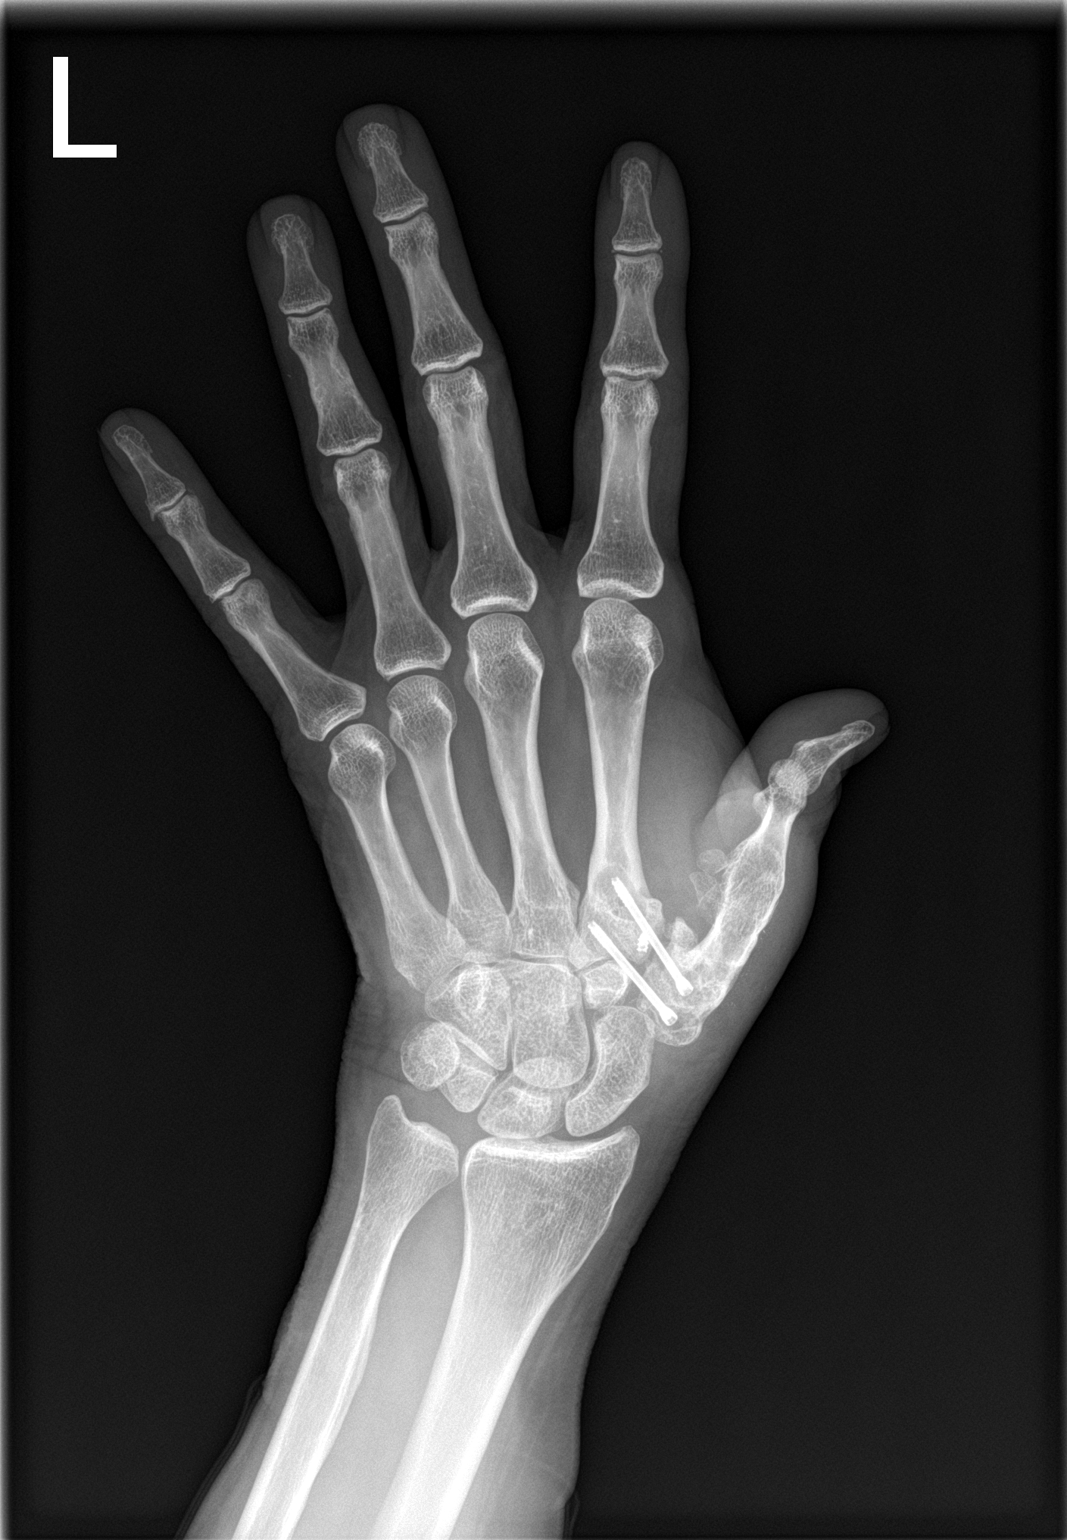

[3 of 3 positions shown; findings below may reference images not displayed]

FINDINGS: Three views of the left hand submitted. No acute fracture or
subluxation. There are postsurgical changes with 2 oblique fusion
metallic screws at the base of first and second metacarpal. No
definite evidence of loosening of the screws. The screws appears
intact. There is probable prior bony fusion of first metatarsal
phalangeal joint. Mild soft tissue swelling adjacent to first
metacarpal and base of left thumb.
IMPRESSION: No acute fracture or subluxation. There are postsurgical changes
with 2 oblique fusion metallic screws at the base of first and
second metacarpal. No definite evidence of loosening of the screws.
The screws appears intact. There is probable prior bony fusion of
first metatarsal phalangeal joint. Mild soft tissue swelling
adjacent to first metacarpal and base of left thumb.

## 2018-03-25 DIAGNOSIS — M19049 Primary osteoarthritis, unspecified hand: Secondary | ICD-10-CM | POA: Diagnosis not present

## 2018-03-25 DIAGNOSIS — M9689 Other intraoperative and postprocedural complications and disorders of the musculoskeletal system: Secondary | ICD-10-CM | POA: Diagnosis not present

## 2018-03-25 DIAGNOSIS — Z981 Arthrodesis status: Secondary | ICD-10-CM | POA: Diagnosis not present

## 2018-03-25 DIAGNOSIS — M19042 Primary osteoarthritis, left hand: Secondary | ICD-10-CM | POA: Diagnosis not present

## 2018-03-28 DIAGNOSIS — S01511A Laceration without foreign body of lip, initial encounter: Secondary | ICD-10-CM | POA: Diagnosis not present

## 2018-03-28 DIAGNOSIS — S0083XA Contusion of other part of head, initial encounter: Secondary | ICD-10-CM | POA: Diagnosis not present

## 2018-03-28 DIAGNOSIS — G35 Multiple sclerosis: Secondary | ICD-10-CM | POA: Diagnosis not present

## 2018-03-29 ENCOUNTER — Ambulatory Visit (INDEPENDENT_AMBULATORY_CARE_PROVIDER_SITE_OTHER): Payer: Medicare Other | Admitting: Physician Assistant

## 2018-03-29 ENCOUNTER — Encounter: Payer: Self-pay | Admitting: Physician Assistant

## 2018-03-29 VITALS — BP 111/64 | HR 108 | Temp 98.3°F | Wt 194.0 lb

## 2018-03-29 DIAGNOSIS — L299 Pruritus, unspecified: Secondary | ICD-10-CM

## 2018-03-29 DIAGNOSIS — L280 Lichen simplex chronicus: Secondary | ICD-10-CM | POA: Diagnosis not present

## 2018-03-29 MED ORDER — TRIAMCINOLONE ACETONIDE 0.5 % EX CREA: 1.0000 "application " | TOPICAL_CREAM | Freq: Two times a day (BID) | CUTANEOUS | 3 refills | Status: DC

## 2018-03-29 NOTE — Patient Instructions (Addendum)
Hold your Terbinafine (toenail fungus pills) Avoid all narcotic pain medications for now as well Moisturize skin with an unscented cream every 2 hours (Cetaphil, [place]) Use Triamcinolone steroid cream on "hot spots" - avoid [place] on face and genitalia  Pruritus Pruritus is an itching feeling. There are many different conditions and factors that can make your skin itchy. Dry skin is one of the most common causes of itching. Most cases of itching do not require medical attention. Itchy skin can turn into a rash. Follow these instructions at home: Watch your pruritus for any changes. Take these steps to help with your condition: Skin Care  Moisturize your skin as needed. A moisturizer that contains petroleum jelly is best for keeping moisture in your skin.  Take or apply medicines only as directed by your health care provider. This may include: ? Corticosteroid cream. ? Anti-itch lotions. ? Oral anti-histamines.  Apply cool compresses to the affected areas.  Try taking a bath with: ? Epsom salts. Follow the instructions on the packaging. You can get these at your local pharmacy or grocery store. ? Baking soda. Pour a small amount into the bath as directed by your health care provider. ? Colloidal oatmeal. Follow the instructions on the packaging. You can get this at your local pharmacy or grocery store.  Try applying baking soda paste to your skin. Stir water into baking soda until it reaches a paste-like consistency.  Do not scratch your skin.  Avoid hot showers or baths, which can make itching worse. A cold shower may help with itching as long as you use a moisturizer after.  Avoid scented soaps, detergents, and perfumes. Use gentle soaps, detergents, perfumes, and other cosmetic products. General instructions  Avoid wearing tight clothes.  Keep a journal to help track what causes your itch. Write down: ? What you eat. ? What cosmetic products you use. ? What you drink. ? What  you wear. This includes jewelry.  Use a humidifier. This keeps the air moist, which helps to prevent dry skin. Contact a health care provider if:  The itching does not go away after several days.  You sweat at night.  You have weight loss.  You are unusually thirsty.  You urinate more than normal.  You are more tired than normal.  You have abdominal pain.  Your skin tingles.  You feel weak.  Your skin or the whites of your eyes look yellow (jaundice).  Your skin feels numb. This information is not intended to replace advice given to you by your health care provider. Make sure you discuss any questions you have with your health care provider. Document Released: 07/08/2011 Document Revised: 04/02/2016 Document Reviewed: 10/22/2014 Elsevier Interactive Patient Education  2018 [employer].   Neurodermatitis Neurodermatitis is inflammation and thickening of the skin. It is caused by severe itchiness, which leads to repeated scratching and rubbing of the skin. It can happen anywhere on the body. Common places include the neck, head, arms, and legs. Neurodermatitis may have mental or emotional (psychogenic) causes that must be treated. Usually, this is a lifelong (chronic) condition, but in some cases it may go away on its own or with treatment. What are the causes? This condition is caused by repeated scratching and rubbing of the skin. This happens because of feelings of severe itchiness. Often, the cause of itchiness is not known. Common causes include:  Materials or substances that irritate the skin.  Skin conditions, such as chronic dermatitis or eczema.  Allergic reaction.  In some cases, neurodermatitis may have psychogenic causes, such as anxiety or stress. What increases the risk? This condition is more likely to develop in:  People who have dry skin or other skin conditions.  People who have anxiety disorders or high amounts of stress.  People who are 7-61  years of age.  Women.  People who are exposed to irritating chemicals.  People who spend time in hot places or sweat a lot.  What are the signs or symptoms? The main symptom of this condition is one or more patches of skin that are red, swollen, itchy, and thicker than normal skin. These patches can be anywhere on the body, but they are often found on the head, neck, legs, and arms. Neurodermatitis can also affect the genital and anal areas. In severe cases, bleeding, crusting, and scaling of the skin can occur. Symptoms often come and go over time. The frequency and severity of symptoms varies from person to person. How is this diagnosed? This condition is diagnosed based on your medical history and a physical exam of your skin. You may be referred to a health care provider who specializes in skin care (dermatologist). You may also have tests, including:  Skin allergy tests. These tests will determine if you have an allergy that is causing your condition.  Tests for certain infections, hemorrhoids, psoriasis, or other conditions. These tests may be done if your groin or anal area is inflamed.  In some cases, your health care provider may remove a small amount of skin cells to be examined under a microscope (biopsy). How is this treated? This condition is managed by stopping all scratching and rubbing of the affected area. It is also important to remove all skin irritants and treat any underlying causes of neurodermatitis. Depending on the cause, your health care provider may recommend certain medicines, such as:  Creams, lotions, or pills to reduce inflammation and itching (corticosteroids).  Medicines to prevent or treat infection (antibiotics).  Medicines to relieve allergy symptoms (antihistamines).  Therapy to learn how to stop feelings of itchiness and stop scratching (behavioral therapy or psychotherapy) may be a treatment option. Your health care provider may recommend a doctor  who specializes in human behavior (psychologist). Follow these instructions at home: Skin Care  Avoid scratching and rubbing your skin. This is the best way to manage neurodermatitis and prevent it from returning.  Keep your skin clean and moisturized. ? Avoid very hot water. ? Apply lotion at least one time per day. ? Avoid products, such as soaps and lotions, that have harsh chemicals, scents, and dyes. ? Try to shower and take baths only as often as you need to. Frequent bathing can dry out your skin.  Drink enough fluid to keep your urine clear or pale yellow.  Always wear sunscreen. General instructions  Use over-the-counter and prescription medicines only as told by your health care provider.  Avoid tight or rough clothing that irritates your skin.  Avoid irritating chemicals.  Pay attention to your symptoms. Watch for things that trigger itching and scratching.  Keep all follow-up visits as told by your health care provider. This is important. Contact a health care provider if:  Your condition gets worse or does not get better after 3-4 days of treatment.  You have blood or fluid leaking from an irritated patch of skin.  You have a fever. This information is not intended to replace advice given to you by your health care provider. Make sure you discuss  any questions you have with your health care provider. Document Released: 12/03/2004 Document Revised: 04/02/2016 Document Reviewed: 03/13/2015 Elsevier Interactive Patient Education  2018 [employer].

## 2018-03-29 NOTE — Progress Notes (Signed)
HPI:                                                                Paige Lozano is a 52 y.o. female who presents to [place]: [place] today for pruritus  Patient complains of pruritus without rash all over for the last week. Skin has become excoriated on bilateral anterior lower extremities. 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 Reports she has also been taking oral terbinafine for a couple of weeks. Denies constitutional symptoms.   Depression screen [facility] 2/9 08/06/2017  Decreased Interest 0  Down, Depressed, Hopeless 0  PHQ - 2 Score 0    No flowsheet data found.    Past Medical History:  Diagnosis Date  . Anxiety   . Bipolar 2 disorder, major depressive episode ([place])   . Complication of anesthesia   . COPD (chronic obstructive pulmonary disease) ([place])    smoker  . Fibromyalgia   . Insomnia   . Multiple sclerosis ([place])   . PONV (postoperative nausea and vomiting)   . Pulmonary embolism ([place]) 2000  . Sleep apnea    mild OSA no CPAP  . Ulcerative colitis [facility])    Past Surgical History:  Procedure Laterality Date  . FOOT SURGERY Left    bone spur  . HEMORRHOID SURGERY    . HUMERUS FRACTURE SURGERY Left   . MYOMECTOMY    . NASAL SINUS SURGERY    . SHOULDER ARTHROSCOPY WITH SUBACROMIAL DECOMPRESSION AND BICEP TENDON REPAIR Left 07/27/2016   Procedure: SHOULDER ARTHROSCOPY DEBRIDEMENT ROTATOR CUFF AND LABRUM, SUBACROMIAL DECOMPRESSION, BICEPS TENODESIS;  Surgeon: Tania Ade, MD;  Location: [facility];  Service: Orthopedics;  Laterality: Left;  SHOULDER ARTHROSCOPY DEBRIDEMENT ROTATOR CUFF AND LABRUM, SUBACROMIAL DECOMPRESSION, BICEPS TENOTOMY, POSSIBLE TENODESIS  . THUMB ARTHROSCOPY     5 surgeries on left thumb, 4 surgeries on right  . TONSILLECTOMY AND ADENOIDECTOMY     Social History   Tobacco Use  . Smoking status: Former Smoker   Packs/day: 0.25    Types: Cigarettes    Last attempt to quit: 07/08/2016    Years since quitting: 1.7  . Smokeless tobacco: Never Used  Substance Use Topics  . Alcohol use: Yes    Alcohol/week: 0.0 oz    Comment: rare    family history is not on file. She was adopted.    ROS: negative except as noted in the HPI  Medications: Current Outpatient Medications  Medication Sig Dispense Refill  . acetaminophen (TYLENOL) 650 MG CR tablet Take 1 tablet (650 mg total) by mouth every 8 (eight) hours as needed for pain. 90 tablet 3  . albuterol (PROVENTIL HFA;VENTOLIN HFA) 108 (90 Base) MCG/ACT inhaler Inhale 2 puffs into the lungs every 6 (six) hours as needed for wheezing or shortness of breath. 1 Inhaler 0  . ALPRAZolam (XANAX) 0.5 MG tablet Take 1 tablet (0.5 mg total) by mouth 2 (two) times daily as needed. for anxiety 45 tablet 5  . balsalazide (COLAZAL) 750 MG capsule TAKE 3 CAPSULES (2,250 MG TOTAL) BY MOUTH 3 (THREE) TIMES DAILY. 270 capsule 0  . cefdinir (OMNICEF) 300 MG capsule Take 1 capsule (300 mg total) by  mouth 2 (two) times daily. 14 capsule 0  . cyclobenzaprine (FLEXERIL) 10 MG tablet TAKE 1/2 (ONE-HALF) TABLET BY MOUTH AT BEDTIME, THEN  INCREASE  GRADUALLY  TO  1  TABLET  3  TIMES  DAILY (Patient not taking: Reported on 03/15/2018) 30 tablet 0  . fluticasone (FLONASE) 50 MCG/ACT nasal spray Place 2 sprays into both nostrils daily. 16 g 3  . guaiFENesin-codeine 100-10 MG/5ML syrup Take 5 mLs by mouth at bedtime as needed for cough. 120 mL 0  . hydrOXYzine (ATARAX/VISTARIL) 25 MG tablet Take 1 tablet (25 mg total) by mouth 3 (three) times daily as needed for itching. (Patient not taking: Reported on 12/15/2017) 60 tablet 0  . lamoTRIgine (LAMICTAL) 25 MG tablet Take 1 tablet (25 mg total) by mouth daily. Take one tablet daily for a week and then start taking 2 tablets. 180 tablet 0  . Natalizumab (TYSABRI IV) Inject into the vein.    . phentermine (ADIPEX-P) 37.5 MG tablet Take 1 tablet  (37.5 mg total) by mouth daily before breakfast. 60 tablet 0  . triamcinolone cream (KENALOG) 0.5 % Apply 1 application topically 2 (two) times daily. To affected areas. 30 g 3  . zolpidem (AMBIEN) 10 MG tablet TAKE 1 TABLET BY MOUTH EVERYDAY AT BEDTIME 30 tablet 3   No current facility-administered medications for this visit.    Allergies  Allergen Reactions  . Ketamine Anaphylaxis  . Oxycodone Diarrhea  . Papaya Enzyme Anaphylaxis       Objective:  BP 111/64   Pulse (!) 108   Temp 98.3 F (36.8 C) (Oral)   Wt 194 lb (88 kg)   BMI 29.50 kg/m  Gen:  alert, not ill-appearing, no distress, appropriate for age 9: head normocephalic without obvious abnormality, conjunctiva and cornea clear, trachea midline Pulm: Normal work of breathing, normal phonation Neuro: alert and oriented x 3, no tremor MSK: extremities atraumatic, normal gait and station Skin: skin is dry with multiple excoriations of the distal anterior lower extremities Psych: well-groomed, cooperative, good eye contact, euthymic mood, affect mood-congruent, speech is articulate, and thought processes clear and goal-directed    No results found for this or any previous visit (from the past 72 hour(s)). No results found.    Assessment and Plan: 52 y.o. female with   Pruritus - Plan: Hepatic function panel  Neurodermatitis - Plan: triamcinolone cream (KENALOG) 0.5 %  - most likely secondary to xerosis. Recommend cutting back on bathing if possible and applying an unscented moisturizing cream Q2H. Kenalog bid to "hot spots" / excoriations - she is concerned about possible allergy to Terbinafine, so we will hold this for now and check LFT's    Patient education and anticipatory guidance given Patient agrees with treatment plan Follow-up as needed if symptoms worsen or fail to improve  Darlyne Russian PA-C

## 2018-03-31 ENCOUNTER — Encounter: Payer: Self-pay | Admitting: Physician Assistant

## 2018-04-07 ENCOUNTER — Encounter: Payer: Self-pay | Admitting: Family Medicine

## 2018-04-07 ENCOUNTER — Ambulatory Visit (INDEPENDENT_AMBULATORY_CARE_PROVIDER_SITE_OTHER): Payer: Medicare Other | Admitting: Family Medicine

## 2018-04-07 VITALS — BP 125/74 | HR 91 | Ht 68.5 in | Wt 195.0 lb

## 2018-04-07 DIAGNOSIS — S01511D Laceration without foreign body of lip, subsequent encounter: Secondary | ICD-10-CM

## 2018-04-07 DIAGNOSIS — R229 Localized swelling, mass and lump, unspecified: Secondary | ICD-10-CM | POA: Diagnosis not present

## 2018-04-07 DIAGNOSIS — M791 Myalgia, unspecified site: Secondary | ICD-10-CM | POA: Diagnosis not present

## 2018-04-07 DIAGNOSIS — G894 Chronic pain syndrome: Secondary | ICD-10-CM | POA: Diagnosis not present

## 2018-04-07 DIAGNOSIS — L299 Pruritus, unspecified: Secondary | ICD-10-CM | POA: Diagnosis not present

## 2018-04-07 DIAGNOSIS — M5136 Other intervertebral disc degeneration, lumbar region: Secondary | ICD-10-CM | POA: Diagnosis not present

## 2018-04-07 DIAGNOSIS — M47816 Spondylosis without myelopathy or radiculopathy, lumbar region: Secondary | ICD-10-CM | POA: Diagnosis not present

## 2018-04-07 DIAGNOSIS — M461 Sacroiliitis, not elsewhere classified: Secondary | ICD-10-CM | POA: Diagnosis not present

## 2018-04-07 LAB — HEPATIC FUNCTION PANEL
AG Ratio: 2.3 (calc) (ref 1.0–2.5)
ALBUMIN MSPROF: 4.6 g/dL (ref 3.6–5.1)
ALT: 12 U/L (ref 6–29)
AST: 16 U/L (ref 10–35)
Alkaline phosphatase (APISO): 91 U/L (ref 33–130)
Bilirubin, Direct: 0.1 mg/dL (ref 0.0–0.2)
GLOBULIN: 2 g/dL (ref 1.9–3.7)
Indirect Bilirubin: 0.3 mg/dL (calc) (ref 0.2–1.2)
TOTAL PROTEIN: 6.6 g/dL (ref 6.1–8.1)
Total Bilirubin: 0.4 mg/dL (ref 0.2–1.2)

## 2018-04-07 NOTE — Patient Instructions (Signed)
Thank you for coming in today. Keep the lip wound covered with ointment.  We will keep an eye on the possible lipoma vs Sebaceous cyst.  Recheck as needed.    Epidermal Cyst An epidermal cyst is sometimes called an epidermal inclusion cyst or an infundibular cyst. It is a sac made of skin tissue. The sac contains a substance called keratin. Keratin is a protein that is normally secreted through the hair follicles. When keratin becomes trapped in the top layer of skin (epidermis), it can form an epidermal cyst. Epidermal cysts are usually found on the face, neck, trunk, and genitals. These cysts are usually harmless (benign), and they may not cause symptoms unless they become infected. It is important not to pop epidermal cysts yourself. What are the causes? This condition may be caused by:  A blocked hair follicle.  A hair that curls and re-enters the skin instead of growing straight out of the skin (ingrown hair).  A blocked pore.  Irritated skin.  An injury to the skin.  Certain conditions that are passed along from parent to child (inherited).  Human papillomavirus (HPV).  What increases the risk? The following factors may make you more likely to develop an epidermal cyst:  Having acne.  Being overweight.  Wearing tight clothing.  What are the signs or symptoms? The only symptom of this condition may be a small, painless lump underneath the skin. When an epidermal cyst becomes infected, symptoms may include:  Redness.  Inflammation.  Tenderness.  Warmth.  Fever.  Keratin draining from the cyst. Keratin may look like a grayish-white, bad-smelling substance.  Pus draining from the cyst.  How is this diagnosed? This condition is diagnosed with a physical exam. In some cases, you may have a sample of tissue (biopsy) taken from your cyst to be examined under a microscope or tested for bacteria. You may be referred to a health care provider who specializes in skin  care (dermatologist). How is this treated? In many cases, epidermal cysts go away on their own without treatment. If a cyst becomes infected, treatment may include:  Opening and draining the cyst. After draining, minor surgery to remove the rest of the cyst may be done.  Antibiotic medicine to help prevent infection.  Injections of medicines (steroids) that help to reduce inflammation.  Surgery to remove the cyst. Surgery may be done if: ? The cyst becomes large. ? The cyst bothers you. ? There is a chance that the cyst could turn into cancer.  Follow these instructions at home:  Take over-the-counter and prescription medicines only as told by your health care provider.  If you were prescribed an antibiotic, use it as told by your health care provider. Do not stop using the antibiotic even if you start to feel better.  Keep the area around your cyst clean and dry.  Wear loose, dry clothing.  Do not try to pop your cyst.  Avoid touching your cyst.  Check your cyst every day for signs of infection.  Keep all follow-up visits as told by your health care provider. This is important. How is this prevented?  Wear clean, dry, clothing.  Avoid wearing tight clothing.  Keep your skin clean and dry. Shower or take baths every day.  Wash your body with a benzoyl peroxide wash when you shower or bathe. Contact a health care provider if:  Your cyst develops symptoms of infection.  Your condition is not improving or is getting worse.  You develop a  cyst that looks different from other cysts you have had.  You have a fever. Get help right away if:  Redness spreads from the cyst into the surrounding area. This information is not intended to replace advice given to you by your health care provider. Make sure you discuss any questions you have with your health care provider. Document Released: 09/26/2004 Document Revised: 06/24/2016 Document Reviewed: 08/28/2015 Elsevier  Interactive Patient Education  [employer].

## 2018-04-07 NOTE — Progress Notes (Signed)
Paige Lozano is a 52 y.o. female who presents to [place]: [place] [facility] today for lip laceration, abdominal wall mass.   Paige Lozano suffered a laceration to her lower lip on May 20.  She was seen in the emergency department where the laceration was repaired using 3 similar sutures using absorbable sutures.  She is here today for follow-up.  She notes the sutures are getting in the way of eating and drinking but the wound feels well with no fevers or chills or discharge.  Additionally Paige Lozano notes a small subcutaneous mass in her right lower abdominal wall.  Is been present for a while but changed recently and slightly enlarged.  She notes is not particularly tender.  She denies any fevers or chills nausea vomiting or diarrhea.  She is currently receiving Tysabri infusions for MS that result in immunosuppression.    ROS as above:  Exam:  BP 125/74   Pulse 91   Ht 5' 8.5" (1.74 m)   Wt 195 lb (88.5 kg)   BMI 29.22 kg/m  Gen: Well NAD HEENT: EOMI,  MMM lip well-appearing healing laceration with 3 simple interrupted sutures. Lungs: Normal work of breathing. CTABL Heart: RRR no MRG Abd: NABS, Soft. Nondistended, Nontender Exts: Brisk capillary refill, warm and well perfused.  Skin: Small mobile subcutaneous mass right lower abdomen approximately 0.5 x 1 cm nontender no expressible pus.  No surrounding erythema or induration or fluctuance.   Assessment and Plan: 52 y.o. female with  Lip laceration doing well.  Sutures removed.  Watchful waiting.  Subcutaneous mass: Likely lipoma.  Plan for watchful waiting if continuing to change consider further imaging versus excisional biopsy.   No orders of the defined types were placed in this encounter.  No orders of the defined types were placed in this encounter.    Historical information moved to improve visibility of documentation.    Past Medical History:  Diagnosis Date  . Anxiety   . Bipolar 2 disorder, major depressive episode ([place])   . Complication of anesthesia   . COPD (chronic obstructive pulmonary disease) ([place])    smoker  . Fibromyalgia   . Insomnia   . Multiple sclerosis ([place])   . PONV (postoperative nausea and vomiting)   . Pulmonary embolism ([place]) 2000  . Sleep apnea    mild OSA no CPAP  . Ulcerative colitis [facility])    Past Surgical History:  Procedure Laterality Date  . FOOT SURGERY Left    bone spur  . HEMORRHOID SURGERY    . HUMERUS FRACTURE SURGERY Left   . MYOMECTOMY    . NASAL SINUS SURGERY    . SHOULDER ARTHROSCOPY WITH SUBACROMIAL DECOMPRESSION AND BICEP TENDON REPAIR Left 07/27/2016   Procedure: SHOULDER ARTHROSCOPY DEBRIDEMENT ROTATOR CUFF AND LABRUM, SUBACROMIAL DECOMPRESSION, BICEPS TENODESIS;  Surgeon: Tania Ade, MD;  Location: [place];  Service: Orthopedics;  Laterality: Left;  SHOULDER ARTHROSCOPY DEBRIDEMENT ROTATOR CUFF AND LABRUM, SUBACROMIAL DECOMPRESSION, BICEPS TENOTOMY, POSSIBLE TENODESIS  . THUMB ARTHROSCOPY     5 surgeries on left thumb, 4 surgeries on right  . TONSILLECTOMY AND ADENOIDECTOMY     Social History   Tobacco Use  . Smoking status: Former Smoker    Packs/day: 0.25    Types: Cigarettes    Last attempt to quit: 07/08/2016    Years since quitting: 1.7  . Smokeless tobacco: Never Used  Substance Use Topics  . Alcohol use: Yes    Alcohol/week:  0.0 oz    Comment: rare    family history is not on file. She was adopted.  Medications: Current Outpatient Medications  Medication Sig Dispense Refill  . acetaminophen (TYLENOL) 650 MG CR tablet Take 1 tablet (650 mg total) by mouth every 8 (eight) hours as needed for pain. 90 tablet 3  . albuterol (PROVENTIL HFA;VENTOLIN HFA) 108 (90 Base) MCG/ACT inhaler Inhale 2 puffs into the lungs every 6 (six) hours as needed for wheezing or shortness of breath. 1 Inhaler 0  . ALPRAZolam (XANAX) 0.5 MG  tablet Take 1 tablet (0.5 mg total) by mouth 2 (two) times daily as needed. for anxiety 45 tablet 5  . balsalazide (COLAZAL) 750 MG capsule TAKE 3 CAPSULES (2,250 MG TOTAL) BY MOUTH 3 (THREE) TIMES DAILY. 270 capsule 0  . cefdinir (OMNICEF) 300 MG capsule Take 1 capsule (300 mg total) by mouth 2 (two) times daily. 14 capsule 0  . cyclobenzaprine (FLEXERIL) 10 MG tablet TAKE 1/2 (ONE-HALF) TABLET BY MOUTH AT BEDTIME, THEN  INCREASE  GRADUALLY  TO  1  TABLET  3  TIMES  DAILY 30 tablet 0  . fluticasone (FLONASE) 50 MCG/ACT nasal spray Place 2 sprays into both nostrils daily. 16 g 3  . guaiFENesin-codeine 100-10 MG/5ML syrup Take 5 mLs by mouth at bedtime as needed for cough. 120 mL 0  . hydrOXYzine (ATARAX/VISTARIL) 25 MG tablet Take 1 tablet (25 mg total) by mouth 3 (three) times daily as needed for itching. 60 tablet 0  . lamoTRIgine (LAMICTAL) 25 MG tablet Take 1 tablet (25 mg total) by mouth daily. Take one tablet daily for a week and then start taking 2 tablets. 180 tablet 0  . Natalizumab (TYSABRI IV) Inject into the vein.    . phentermine (ADIPEX-P) 37.5 MG tablet Take 1 tablet (37.5 mg total) by mouth daily before breakfast. 60 tablet 0  . triamcinolone cream (KENALOG) 0.5 % Apply 1 application topically 2 (two) times daily. To affected areas. 30 g 3  . zolpidem (AMBIEN) 10 MG tablet TAKE 1 TABLET BY MOUTH EVERYDAY AT BEDTIME 30 tablet 3   No current facility-administered medications for this visit.    Allergies  Allergen Reactions  . Ketamine Anaphylaxis  . Oxycodone Diarrhea  . Papaya Enzyme Anaphylaxis    Health Maintenance Health Maintenance  Topic Date Due  . HIV Screening  10/05/1981  . TETANUS/TDAP  10/05/1985  . PAP SMEAR  10/06/1987  . MAMMOGRAM  10/05/2016  . INFLUENZA VACCINE  09/09/2018 (Originally 06/09/2018)  . COLONOSCOPY  12/25/2027    Discussed warning signs or symptoms. Please see discharge instructions. Patient expresses understanding.

## 2018-04-13 DIAGNOSIS — S62235K Other nondisplaced fracture of base of first metacarpal bone, left hand, subsequent encounter for fracture with nonunion: Secondary | ICD-10-CM | POA: Diagnosis not present

## 2018-04-13 DIAGNOSIS — M9689 Other intraoperative and postprocedural complications and disorders of the musculoskeletal system: Secondary | ICD-10-CM | POA: Diagnosis not present

## 2018-04-13 DIAGNOSIS — Z885 Allergy status to narcotic agent status: Secondary | ICD-10-CM | POA: Diagnosis not present

## 2018-04-13 DIAGNOSIS — Z884 Allergy status to anesthetic agent status: Secondary | ICD-10-CM | POA: Diagnosis not present

## 2018-04-13 DIAGNOSIS — M1812 Unilateral primary osteoarthritis of first carpometacarpal joint, left hand: Secondary | ICD-10-CM | POA: Diagnosis not present

## 2018-04-21 ENCOUNTER — Telehealth (HOSPITAL_COMMUNITY): Payer: Self-pay | Admitting: Psychiatry

## 2018-04-21 ENCOUNTER — Other Ambulatory Visit (HOSPITAL_COMMUNITY): Payer: Self-pay | Admitting: Psychiatry

## 2018-04-21 ENCOUNTER — Telehealth: Payer: Self-pay | Admitting: Physician Assistant

## 2018-04-21 MED ORDER — LAMOTRIGINE 25 MG PO TABS: 25.0000 mg | ORAL_TABLET | Freq: Every day | ORAL | 0 refills | Status: DC

## 2018-04-21 NOTE — Telephone Encounter (Signed)
Pt needs refill on lamictal 100mg  sent to [facility] on [address]  This was discussed at last office visit.

## 2018-04-21 NOTE — Telephone Encounter (Signed)
Can send 

## 2018-04-21 NOTE — Telephone Encounter (Signed)
Sent medication to the pharmacy per Dr. De Nurse. Informed patient.

## 2018-04-21 NOTE — Telephone Encounter (Signed)
PT Called about itching persisting even with medication. Wants to know if there is anything else that can be prescribed.

## 2018-04-22 DIAGNOSIS — G35 Multiple sclerosis: Secondary | ICD-10-CM | POA: Diagnosis not present

## 2018-04-25 ENCOUNTER — Other Ambulatory Visit: Payer: Self-pay | Admitting: Family Medicine

## 2018-05-04 DIAGNOSIS — M7918 Myalgia, other site: Secondary | ICD-10-CM | POA: Diagnosis not present

## 2018-05-04 DIAGNOSIS — G894 Chronic pain syndrome: Secondary | ICD-10-CM | POA: Diagnosis not present

## 2018-05-04 DIAGNOSIS — M791 Myalgia, unspecified site: Secondary | ICD-10-CM | POA: Diagnosis not present

## 2018-05-10 DIAGNOSIS — M461 Sacroiliitis, not elsewhere classified: Secondary | ICD-10-CM | POA: Diagnosis not present

## 2018-05-10 DIAGNOSIS — M47817 Spondylosis without myelopathy or radiculopathy, lumbosacral region: Secondary | ICD-10-CM | POA: Diagnosis not present

## 2018-05-19 DIAGNOSIS — M9689 Other intraoperative and postprocedural complications and disorders of the musculoskeletal system: Secondary | ICD-10-CM | POA: Diagnosis not present

## 2018-05-19 DIAGNOSIS — M19042 Primary osteoarthritis, left hand: Secondary | ICD-10-CM | POA: Diagnosis not present

## 2018-05-19 DIAGNOSIS — Z472 Encounter for removal of internal fixation device: Secondary | ICD-10-CM | POA: Diagnosis not present

## 2018-05-23 ENCOUNTER — Encounter: Payer: Self-pay | Admitting: Physician Assistant

## 2018-05-23 ENCOUNTER — Ambulatory Visit (INDEPENDENT_AMBULATORY_CARE_PROVIDER_SITE_OTHER): Payer: Medicare Other | Admitting: Physician Assistant

## 2018-05-23 VITALS — BP 131/85 | HR 99 | Wt 197.0 lb

## 2018-05-23 DIAGNOSIS — F5101 Primary insomnia: Secondary | ICD-10-CM | POA: Diagnosis not present

## 2018-05-23 DIAGNOSIS — L299 Pruritus, unspecified: Secondary | ICD-10-CM

## 2018-05-23 MED ORDER — HYDROXYZINE PAMOATE 25 MG PO CAPS: 25.0000 mg | ORAL_CAPSULE | Freq: Three times a day (TID) | ORAL | 0 refills | Status: DC | PRN

## 2018-05-23 MED ORDER — QUETIAPINE FUMARATE 25 MG PO TABS: 25.0000 mg | ORAL_TABLET | Freq: Every day | ORAL | 1 refills | Status: DC

## 2018-05-23 MED ORDER — FEXOFENADINE HCL 180 MG PO TABS: 180.0000 mg | ORAL_TABLET | Freq: Every day | ORAL | 4 refills | Status: DC

## 2018-05-25 ENCOUNTER — Other Ambulatory Visit: Payer: Self-pay | Admitting: Physician Assistant

## 2018-05-26 DIAGNOSIS — G35 Multiple sclerosis: Secondary | ICD-10-CM | POA: Diagnosis not present

## 2018-05-30 NOTE — Progress Notes (Signed)
Subjective:    Patient ID: Paige Lozano, female    DOB: 13-Feb-1966, 52 y.o.   MRN: [id number]  HPI Pt is a 52 yo female who presents to the clinic with an itchy rash on arms and legs. 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 She has used good moisturizer and does help but she continues to itch. She washes off with soap after exiting the pool. Her liver enzymes were checked and looked good. No fever or chills. Pt denies any medication changes. No new lotions or detergents.    Lorrin Mais is not helping like it used to with sleep.   .. Active Ambulatory Problems    Diagnosis Date Noted  . Multiple sclerosis ([place]) 05/29/2016  . Fibromyalgia 05/29/2016  . 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 Insomnia 05/29/2016  . 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 Bipolar 2 disorder ([place]) 05/29/2016  . Current smoker 05/29/2016  . Primary osteoarthritis of right knee 06/01/2016  . Anxiety state 06/01/2016  . Left shoulder pain 06/01/2016  . GAD (generalized anxiety disorder) 07/20/2016  . History of pulmonary embolus (PE) 07/21/2016  . Right elbow pain 07/21/2016  . Sinusitis 11/30/2016  . Hot flashes 12/29/2016  . Body aches 12/29/2016  . Pruritus 12/29/2016  . Palpitations 03/08/2017  . Chest tightness 03/08/2017  . Tachycardia 03/08/2017  . Sinus tachycardia 03/12/2017  . PAC (premature atrial contraction) 03/12/2017  . Lumbar degenerative disc disease 06/02/2017  . Plantar fasciitis, bilateral 10/29/2017  . Onychomycosis 02/17/2018  . 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 Subcutaneous mass 04/07/2018   Resolved Ambulatory Problems    Diagnosis Date Noted  . Right lateral epicondylitis 07/21/2016  . Right wrist injury 12/29/2016  . Hand injury, left, subsequent encounter 12/30/2016  . Chills (without fever) 03/08/2017   Past Medical History:  Diagnosis  Date  . Anxiety   . Bipolar 2 disorder, major depressive episode ([place])   . Complication of anesthesia   . COPD (chronic obstructive pulmonary disease) ([place])   . Fibromyalgia   . Insomnia   . Multiple sclerosis ([place])   . PONV (postoperative nausea and vomiting)   . Pulmonary embolism ([place]) 2000  . Sleep apnea   . Ulcerative colitis ([place])       Review of Systems See HPI.     Objective:   Physical Exam  Constitutional: She is oriented to person, place, and time. She appears well-developed and well-nourished.  HENT:  Head: Normocephalic and atraumatic.  Cardiovascular: Normal rate and regular rhythm.  Pulmonary/Chest: Effort normal and breath sounds normal.  Neurological: She is alert and oriented to person, place, and time.  Skin:  Skin is dry with scattered tiny erythematous to flesh colored papules on upper and lower extremities with some excoriations.   Psychiatric: She has a normal mood and affect. Her behavior is normal.          Assessment & Plan:  Marland KitchenMarland KitchenAntasia was seen today for rash.  Diagnoses and all orders for this visit:  Pruritus -     fexofenadine (ALLEGRA ALLERGY) 180 MG tablet; Take 1 tablet (180 mg total) by mouth daily. -     hydrOXYzine (VISTARIL) 25 MG capsule; Take 1 capsule (25 mg total) by mouth 3 (three) times daily as needed.  Primary insomnia -     QUEtiapine (SEROQUEL) 25 MG tablet; Take 1 tablet (25 mg total)  by mouth at bedtime.   Unclear etiology. Rash is not impressive and does not look like bug bites or systemic. Start allegra to replace other antihistamine. Vistaril for itching when it gets bad. She will be out of pool for 2 weeks due to a left shoulder injury. Lets see if improves while completely out of water. Keep moisturizing regularly. Keep communication through [employer].   Stop ambien. Start seroquel for sleep.

## 2018-05-31 DIAGNOSIS — Z4802 Encounter for removal of sutures: Secondary | ICD-10-CM | POA: Diagnosis not present

## 2018-05-31 DIAGNOSIS — Z9889 Other specified postprocedural states: Secondary | ICD-10-CM | POA: Diagnosis not present

## 2018-05-31 DIAGNOSIS — Z4789 Encounter for other orthopedic aftercare: Secondary | ICD-10-CM | POA: Diagnosis not present

## 2018-06-02 DIAGNOSIS — R2681 Unsteadiness on feet: Secondary | ICD-10-CM | POA: Diagnosis not present

## 2018-06-02 DIAGNOSIS — Z79899 Other long term (current) drug therapy: Secondary | ICD-10-CM | POA: Diagnosis not present

## 2018-06-02 DIAGNOSIS — F319 Bipolar disorder, unspecified: Secondary | ICD-10-CM | POA: Diagnosis not present

## 2018-06-02 DIAGNOSIS — G35 Multiple sclerosis: Secondary | ICD-10-CM | POA: Diagnosis not present

## 2018-06-02 DIAGNOSIS — G603 Idiopathic progressive neuropathy: Secondary | ICD-10-CM | POA: Diagnosis not present

## 2018-06-07 ENCOUNTER — Other Ambulatory Visit: Payer: Self-pay | Admitting: Physician Assistant

## 2018-06-07 ENCOUNTER — Encounter (HOSPITAL_COMMUNITY): Payer: Self-pay | Admitting: Psychiatry

## 2018-06-07 ENCOUNTER — Ambulatory Visit (INDEPENDENT_AMBULATORY_CARE_PROVIDER_SITE_OTHER): Payer: Medicare Other | Admitting: Psychiatry

## 2018-06-07 VITALS — BP 110/78 | Ht 68.5 in | Wt 196.0 lb

## 2018-06-07 DIAGNOSIS — F411 Generalized anxiety disorder: Secondary | ICD-10-CM

## 2018-06-07 DIAGNOSIS — F3181 Bipolar II disorder: Secondary | ICD-10-CM

## 2018-06-07 DIAGNOSIS — F4310 Post-traumatic stress disorder, unspecified: Secondary | ICD-10-CM

## 2018-06-07 DIAGNOSIS — Z79899 Other long term (current) drug therapy: Secondary | ICD-10-CM | POA: Diagnosis not present

## 2018-06-07 DIAGNOSIS — F5101 Primary insomnia: Secondary | ICD-10-CM

## 2018-06-07 MED ORDER — LAMOTRIGINE 150 MG PO TABS: 150.0000 mg | ORAL_TABLET | Freq: Every day | ORAL | 1 refills | Status: DC

## 2018-06-07 NOTE — Progress Notes (Signed)
[facility] Outpatient Follow up visit   Patient Identification: Paige Lozano MRN:  [id number] Date of Evaluation:  06/07/2018 Referral Source: [facility] Primary care Chief Complaint:   Chief Complaint    Follow-up; Other     Visit Diagnosis:    ICD-10-CM   1. Bipolar 2 disorder (HCC) F31.81   2. PTSD (post-traumatic stress disorder) F43.10   3. GAD (generalized anxiety disorder) F41.1     History of Present Illness:  52 years old Returns for follow-up and medication management   Has had depressive days. Now lamictal at 100mg . Says need to increase it Seen neurology for MS and fatigue. Some concern was prescribed amantadine and there was talk about klonopine for fatigue. I told her to get back office   BF related stress has triggered depression but they talked it thru  No rash  Anxiety: stressed at times,  taking xanax  prn  Aggravating factor: surgeries . Multiple medical .surgeries Modifying factor: dogs No rash     Previous Psychotropic Medications: Yes   Substance Abuse History in the last 12 months:  Yes.   as per history Marijuana says it is medical for her condition.  Consequences of Substance Abuse: Medical Consequences:  fatigue, poor concenctration  Past Medical History:  Past Medical History:  Diagnosis Date  . Anxiety   . Bipolar 2 disorder, major depressive episode ([place])   . Complication of anesthesia   . COPD (chronic obstructive pulmonary disease) ([place])    smoker  . Fibromyalgia   . Insomnia   . Multiple sclerosis ([place])   . PONV (postoperative nausea and vomiting)   . Pulmonary embolism ([place]) 2000  . Sleep apnea    mild OSA no CPAP  . Ulcerative colitis [facility])     Past Surgical History:  Procedure Laterality Date  . FOOT SURGERY Left    bone spur  . HEMORRHOID SURGERY    . HUMERUS FRACTURE SURGERY Left   . MYOMECTOMY    . NASAL SINUS SURGERY    . SHOULDER ARTHROSCOPY WITH SUBACROMIAL DECOMPRESSION AND BICEP TENDON REPAIR Left 07/27/2016    Procedure: SHOULDER ARTHROSCOPY DEBRIDEMENT ROTATOR CUFF AND LABRUM, SUBACROMIAL DECOMPRESSION, BICEPS TENODESIS;  Surgeon: Tania Ade, MD;  Location: [place];  Service: Orthopedics;  Laterality: Left;  SHOULDER ARTHROSCOPY DEBRIDEMENT ROTATOR CUFF AND LABRUM, SUBACROMIAL DECOMPRESSION, BICEPS TENOTOMY, POSSIBLE TENODESIS  . THUMB ARTHROSCOPY     5 surgeries on left thumb, 4 surgeries on right  . TONSILLECTOMY AND ADENOIDECTOMY       Family History:  Family History  Adopted: Yes    Social History:   Social History   Socioeconomic History  . Marital status: Divorced    Spouse name: Not on file  . Number of children: Not on file  . Years of education: Not on file  . Highest education level: Not on file  Occupational History  . Not on file  Social Needs  . Financial resource strain: Not on file  . Food insecurity:    Worry: Not on file    Inability: Not on file  . Transportation needs:    Medical: Not on file    Non-medical: Not on file  Tobacco Use  . Smoking status: Former Smoker    Packs/day: 0.25    Types: Cigarettes    Last attempt to quit: 07/08/2016    Years since quitting: 1.9  . Smokeless tobacco: Never Used  Substance and Sexual Activity  . Alcohol use: Yes    Alcohol/week: 0.0 oz  Comment: rare   . Drug use: No    Types: Marijuana    Comment: last use 5 month ago  . Sexual activity: Yes    Partners: Male    Birth control/protection: None  Lifestyle  . Physical activity:    Days per week: Not on file    Minutes per session: Not on file  . Stress: Not on file  Relationships  . Social connections:    Talks on phone: Not on file    Gets together: Not on file    Attends religious service: Not on file    Active member of club or organization: Not on file    Attends meetings of clubs or organizations: Not on file    Relationship status: Not on file  Other Topics Concern  . Not on file  Social History Narrative  . Not on file      Allergies:   Allergies  Allergen Reactions  . Ketamine Anaphylaxis  . Oxycodone Diarrhea  . Papaya Enzyme Anaphylaxis    Metabolic Disorder Labs: No results found for: HGBA1C, MPG No results found for: PROLACTIN No results found for: CHOL, TRIG, HDL, CHOLHDL, VLDL, LDLCALC   Current Medications: Current Outpatient Medications  Medication Sig Dispense Refill  . acetaminophen (TYLENOL) 650 MG CR tablet Take 1 tablet (650 mg total) by mouth every 8 (eight) hours as needed for pain. 90 tablet 3  . albuterol (PROVENTIL HFA;VENTOLIN HFA) 108 (90 Base) MCG/ACT inhaler Inhale 2 puffs into the lungs every 6 (six) hours as needed for wheezing or shortness of breath. 1 Inhaler 0  . balsalazide (COLAZAL) 750 MG capsule TAKE 3 CAPSULES (2,250 MG TOTAL) BY MOUTH 3 (THREE) TIMES DAILY. 270 capsule 0  . cyclobenzaprine (FLEXERIL) 10 MG tablet TAKE 1/2 (ONE-HALF) TABLET BY MOUTH AT BEDTIME, THEN  INCREASE  GRADUALLY  TO  1  TABLET  3  TIMES  DAILY 30 tablet 0  . fexofenadine (ALLEGRA ALLERGY) 180 MG tablet Take 1 tablet (180 mg total) by mouth daily. 90 tablet 4  . fluticasone (FLONASE) 50 MCG/ACT nasal spray Place 2 sprays into both nostrils daily. 16 g 3  . hydrOXYzine (ATARAX/VISTARIL) 25 MG tablet Take 1 tablet (25 mg total) by mouth 3 (three) times daily as needed for itching. 60 tablet 0  . hydrOXYzine (VISTARIL) 25 MG capsule Take 1 capsule (25 mg total) by mouth 3 (three) times daily as needed. 90 capsule 0  . lamoTRIgine (LAMICTAL) 150 MG tablet Take 1 tablet (150 mg total) by mouth daily. 30 tablet 1  . Natalizumab (TYSABRI IV) Inject into the vein.    Marland Kitchen QUEtiapine (SEROQUEL) 25 MG tablet Take 1 tablet (25 mg total) by mouth at bedtime. 30 tablet 1  . triamcinolone cream (KENALOG) 0.5 % Apply 1 application topically 2 (two) times daily. To affected areas. 30 g 3  . zolpidem (AMBIEN) 10 MG tablet TAKE 1 TABLET BY MOUTH EVERYDAY AT BEDTIME 30 tablet 3  . Cholecalciferol (VITAMIN D-1000  MAX ST) 1000 units tablet Take by mouth.    . Multiple Vitamin (MULTI-VITAMINS) TABS Take by mouth.    Marland Kitchen tiZANidine (ZANAFLEX) 4 MG tablet Take 4 mg by mouth 2 (two) times daily as needed.  2   No current facility-administered medications for this visit.       Psychiatric Specialty Exam: Review of Systems  Cardiovascular: Negative for palpitations.  Musculoskeletal: Positive for myalgias.  Skin: Negative for rash.  Neurological: Negative for tremors and headaches.  Psychiatric/Behavioral: Negative  for depression and suicidal ideas.    Blood pressure 110/78, height 5' 8.5" (1.74 m), weight 196 lb (88.9 kg).Body mass index is 29.37 kg/m.  General Appearance: Casual  Eye Contact:  Fair  Speech:  Normal Rate  Volume:  normal  Mood: subdued  Affect:  congruent  Thought Process:  Goal Directed  Orientation:  Full (Time, Place, and Person)  Thought Content:  Logical  Suicidal Thoughts:  No  Homicidal Thoughts:  No  Memory:  Immediate;   Fair Recent;   Fair  Judgement:  Fair  Insight:  Fair  Psychomotor Activity:  Normal  Concentration:  Concentration: Fair and Attention Span: Fair  Recall:  [employer] of Knowledge:Fair  Language: Fair  Akathisia:  Negative  Handed:  Right  AIMS (if indicated):    Assets:  Desire for Improvement  ADL's:  Intact  Cognition: WNL  Sleep:  Fair while on meds    Treatment Plan Summary: Medication management and Plan as follows  Bipolar 2:  Feeling subdued. Increase lamictal to 150mg .  GAD: stressed recently . Taking xanax Talk with neurology of med change discussed for possible fatigue and if its klonopine to keep dose low and stop xanax  Insomnia: difficuult to sleep without ambine. Reviewed sleep hygiene  avoid phenteramine or stimulants Fu 23m Reviewed concerns   Merian Capron, MD 7/30/20193:51 PM

## 2018-06-08 ENCOUNTER — Other Ambulatory Visit: Payer: Self-pay | Admitting: Physician Assistant

## 2018-06-08 DIAGNOSIS — F5101 Primary insomnia: Secondary | ICD-10-CM

## 2018-06-09 DIAGNOSIS — Z4789 Encounter for other orthopedic aftercare: Secondary | ICD-10-CM | POA: Diagnosis not present

## 2018-06-09 DIAGNOSIS — M1812 Unilateral primary osteoarthritis of first carpometacarpal joint, left hand: Secondary | ICD-10-CM | POA: Diagnosis not present

## 2018-06-09 DIAGNOSIS — M9689 Other intraoperative and postprocedural complications and disorders of the musculoskeletal system: Secondary | ICD-10-CM | POA: Diagnosis not present

## 2018-06-09 DIAGNOSIS — Z9889 Other specified postprocedural states: Secondary | ICD-10-CM | POA: Diagnosis not present

## 2018-06-09 DIAGNOSIS — M19049 Primary osteoarthritis, unspecified hand: Secondary | ICD-10-CM | POA: Diagnosis not present

## 2018-06-14 ENCOUNTER — Other Ambulatory Visit: Payer: Self-pay | Admitting: *Deleted

## 2018-06-14 ENCOUNTER — Other Ambulatory Visit: Payer: Self-pay | Admitting: Physician Assistant

## 2018-06-14 DIAGNOSIS — L299 Pruritus, unspecified: Secondary | ICD-10-CM

## 2018-06-17 DIAGNOSIS — F319 Bipolar disorder, unspecified: Secondary | ICD-10-CM | POA: Diagnosis not present

## 2018-06-17 DIAGNOSIS — M899 Disorder of bone, unspecified: Secondary | ICD-10-CM | POA: Diagnosis not present

## 2018-06-17 DIAGNOSIS — J449 Chronic obstructive pulmonary disease, unspecified: Secondary | ICD-10-CM | POA: Diagnosis not present

## 2018-06-17 DIAGNOSIS — M9689 Other intraoperative and postprocedural complications and disorders of the musculoskeletal system: Secondary | ICD-10-CM | POA: Diagnosis not present

## 2018-06-17 DIAGNOSIS — R319 Hematuria, unspecified: Secondary | ICD-10-CM | POA: Diagnosis not present

## 2018-06-17 DIAGNOSIS — M949 Disorder of cartilage, unspecified: Secondary | ICD-10-CM | POA: Diagnosis not present

## 2018-06-17 DIAGNOSIS — K5190 Ulcerative colitis, unspecified, without complications: Secondary | ICD-10-CM | POA: Diagnosis not present

## 2018-06-17 DIAGNOSIS — M7918 Myalgia, other site: Secondary | ICD-10-CM | POA: Diagnosis not present

## 2018-06-17 DIAGNOSIS — G35 Multiple sclerosis: Secondary | ICD-10-CM | POA: Diagnosis not present

## 2018-06-17 DIAGNOSIS — N951 Menopausal and female climacteric states: Secondary | ICD-10-CM | POA: Diagnosis not present

## 2018-06-17 DIAGNOSIS — Z87891 Personal history of nicotine dependence: Secondary | ICD-10-CM | POA: Diagnosis not present

## 2018-06-19 DIAGNOSIS — Z87891 Personal history of nicotine dependence: Secondary | ICD-10-CM | POA: Insufficient documentation

## 2018-06-19 DIAGNOSIS — N951 Menopausal and female climacteric states: Secondary | ICD-10-CM | POA: Insufficient documentation

## 2018-06-19 DIAGNOSIS — M899 Disorder of bone, unspecified: Secondary | ICD-10-CM | POA: Insufficient documentation

## 2018-06-23 DIAGNOSIS — M791 Myalgia, unspecified site: Secondary | ICD-10-CM | POA: Diagnosis not present

## 2018-06-23 DIAGNOSIS — G894 Chronic pain syndrome: Secondary | ICD-10-CM | POA: Diagnosis not present

## 2018-06-23 DIAGNOSIS — G35 Multiple sclerosis: Secondary | ICD-10-CM | POA: Diagnosis not present

## 2018-06-30 ENCOUNTER — Other Ambulatory Visit (HOSPITAL_COMMUNITY): Payer: Self-pay | Admitting: Psychiatry

## 2018-06-30 ENCOUNTER — Other Ambulatory Visit: Payer: Self-pay | Admitting: Physician Assistant

## 2018-06-30 DIAGNOSIS — F5101 Primary insomnia: Secondary | ICD-10-CM

## 2018-07-05 ENCOUNTER — Other Ambulatory Visit: Payer: Self-pay | Admitting: Physician Assistant

## 2018-07-05 DIAGNOSIS — Z780 Asymptomatic menopausal state: Secondary | ICD-10-CM | POA: Diagnosis not present

## 2018-07-05 DIAGNOSIS — L299 Pruritus, unspecified: Secondary | ICD-10-CM

## 2018-07-06 ENCOUNTER — Ambulatory Visit (INDEPENDENT_AMBULATORY_CARE_PROVIDER_SITE_OTHER): Payer: Medicare Other | Admitting: Family Medicine

## 2018-07-06 ENCOUNTER — Encounter: Payer: Self-pay | Admitting: Family Medicine

## 2018-07-06 VITALS — BP 107/70 | HR 90 | Ht 68.5 in | Wt 191.0 lb

## 2018-07-06 DIAGNOSIS — M722 Plantar fascial fibromatosis: Secondary | ICD-10-CM | POA: Diagnosis not present

## 2018-07-06 DIAGNOSIS — Z981 Arthrodesis status: Secondary | ICD-10-CM | POA: Diagnosis not present

## 2018-07-06 DIAGNOSIS — F5101 Primary insomnia: Secondary | ICD-10-CM

## 2018-07-06 DIAGNOSIS — M19042 Primary osteoarthritis, left hand: Secondary | ICD-10-CM | POA: Diagnosis not present

## 2018-07-06 DIAGNOSIS — Z9889 Other specified postprocedural states: Secondary | ICD-10-CM | POA: Diagnosis not present

## 2018-07-06 MED ORDER — QUETIAPINE FUMARATE 50 MG PO TABS: 50.0000 mg | ORAL_TABLET | Freq: Every day | ORAL | 0 refills | Status: DC

## 2018-07-06 NOTE — Progress Notes (Signed)
Paige Lozano is a 52 y.o. female who presents to [facility] today for follow-up plantar fasciitis.  Paige Lozano has a history of bilateral plantar fasciitis.  She was last seen for this in December 2018 where she received bilateral steroid injections.  She did well until recently where she increased her activity.  She walked for about 6 miles at the beach and recently moved into a house with hardwood floors.  She notes her plantar fasciitis is been worsening.  Her left is much worse than her right currently.  Additionally she notes that in a few days she is going to be leaving for a 3-week trip to [place] should be doing a lot of walking.  She notes the plantar fasciitis symptoms are worse with activity and better with rest.  Additionally she notes pain when she gets out of bed in the morning.  Additionally her primary care provider my partner Iran Planas PA has been seeing Paige Lozano for insomnia.  Her coexisting psychiatric problems include bipolar disorder which are typically well controlled.  Patient in the process of weaning off of Ambien and getting started on Seroquel.  She was prescribed 25 mg of Seroquel at night.  She notes this helps but does not control her sleep sufficiently.  She like to increase it to 50 mg if possible.   ROS as above:  Exam:  BP 107/70   Pulse 90   Ht 5' 8.5" (1.74 m)   Wt 191 lb (86.6 kg)   BMI 28.62 kg/m  Wt Readings from Last 5 Encounters:  07/06/18 191 lb (86.6 kg)  05/23/18 197 lb (89.4 kg)  04/07/18 195 lb (88.5 kg)  03/29/18 194 lb (88 kg)  02/17/18 195 lb 9.6 oz (88.7 kg)    Gen: Well NAD Psych: Alert and oriented normal speech thought process and affect.  Left foot normal-appearing with no significant deformities. Tender to palpation medial plantar calcaneus. Foot and ankle motion are normal. Strength is intact. Pulses capillary  refill and sensation are intact  Right foot: Normal-appearing with no deformities. Tender to palpation medial plantar calcaneus. Foot and ankle motion are normal. Strength is intact. Pulses cap refill and sensation are intact.  Lab and Radiology Results Procedure: Real-time Ultrasound Guided Injection of left plantar fascia  Device: GE Logiq E   Images permanently stored and available for review in the ultrasound unit. Verbal informed consent obtained.  Discussed risks and benefits of procedure. Warned about infection bleeding damage to structures skin hypopigmentation and fat atrophy among others. Patient expresses understanding and agreement Time-out conducted.   Noted no overlying erythema, induration, or other signs of local infection.   Skin prepped in a sterile fashion.   Local anesthesia: Topical Ethyl chloride.   With sterile technique and under real time ultrasound guidance:  40mg  kenalog and 23ml marcaine injected easily.   Completed without difficulty   Pain immediately resolved suggesting accurate placement of the medication.   Advised to call if fevers/chills, erythema, induration, drainage, or persistent bleeding.   Images permanently stored and available for review in the ultrasound unit.  Impression: Technically successful ultrasound guided injection.     Assessment and Plan: 52 y.o. female with  Left foot plantar fasciitis: This is the more symptomatic side.  Status post injection today.  Continue home exercise program and typical treatment.  Patient had significant pain during injection but had significant pain relief immediately following injection.  Recheck with me as  needed for this issue.  Chronic problem with recent exacerbation.  Right foot plantar fasciitis: Chronic issue with exacerbation.  This is the less symptomatic side.  As patient had significant pain with the left foot injection plan to proceed with continued home exercise program and typical treatment  and watchful waiting.  If worsening will proceed with right foot injection in the future.  Insomnia: Chronic issue not improved.  Plan to continue to wean off Ambien and increase Seroquel.  Increase to 50 mg daily.  Follow-up with PCP for this issue in 1 month.  Flu vaccine: Patient is traveling for vacation soon and would like to defer the flu vaccine for 1 month until she can follow-up with her PCP.  Like to get the flu vaccine next month.   No orders of the defined types were placed in this encounter.  Meds ordered this encounter  Medications  . QUEtiapine (SEROQUEL) 50 MG tablet    Sig: Take 1 tablet (50 mg total) by mouth at bedtime.    Dispense:  30 tablet    Refill:  0     Historical information moved to improve visibility of documentation.  Past Medical History:  Diagnosis Date  . Anxiety   . Bipolar 2 disorder, major depressive episode ([place])   . Complication of anesthesia   . COPD (chronic obstructive pulmonary disease) ([place])    smoker  . Fibromyalgia   . Insomnia   . Multiple sclerosis ([place])   . PONV (postoperative nausea and vomiting)   . Pulmonary embolism ([place]) 2000  . Sleep apnea    mild OSA no CPAP  . Ulcerative colitis [facility])    Past Surgical History:  Procedure Laterality Date  . FOOT SURGERY Left    bone spur  . HEMORRHOID SURGERY    . HUMERUS FRACTURE SURGERY Left   . MYOMECTOMY    . NASAL SINUS SURGERY    . SHOULDER ARTHROSCOPY WITH SUBACROMIAL DECOMPRESSION AND BICEP TENDON REPAIR Left 07/27/2016   Procedure: SHOULDER ARTHROSCOPY DEBRIDEMENT ROTATOR CUFF AND LABRUM, SUBACROMIAL DECOMPRESSION, BICEPS TENODESIS;  Surgeon: Tania Ade, MD;  Location: [place];  Service: Orthopedics;  Laterality: Left;  SHOULDER ARTHROSCOPY DEBRIDEMENT ROTATOR CUFF AND LABRUM, SUBACROMIAL DECOMPRESSION, BICEPS TENOTOMY, POSSIBLE TENODESIS  . THUMB ARTHROSCOPY     5 surgeries on left thumb, 4 surgeries on right  . TONSILLECTOMY AND ADENOIDECTOMY      Social History   Tobacco Use  . Smoking status: Former Smoker    Packs/day: 0.25    Types: Cigarettes    Last attempt to quit: 07/08/2016    Years since quitting: 1.9  . Smokeless tobacco: Never Used  Substance Use Topics  . Alcohol use: Yes    Alcohol/week: 0.0 standard drinks    Comment: rare    family history is not on file. She was adopted.  Medications: Current Outpatient Medications  Medication Sig Dispense Refill  . acetaminophen (TYLENOL) 650 MG CR tablet Take 1 tablet (650 mg total) by mouth every 8 (eight) hours as needed for pain. 90 tablet 3  . albuterol (PROVENTIL HFA;VENTOLIN HFA) 108 (90 Base) MCG/ACT inhaler Inhale 2 puffs into the lungs every 6 (six) hours as needed for wheezing or shortness of breath. 1 Inhaler 0  . balsalazide (COLAZAL) 750 MG capsule TAKE 3 CAPSULES (2,250 MG TOTAL) BY MOUTH 3 (THREE) TIMES DAILY. 270 capsule 0  . Cholecalciferol (VITAMIN D-1000 MAX ST) 1000 units tablet Take by mouth.    . cyclobenzaprine (FLEXERIL) 10 MG  tablet TAKE 1/2 (ONE-HALF) TABLET BY MOUTH AT BEDTIME, THEN  INCREASE  GRADUALLY  TO  1  TABLET  3  TIMES  DAILY 30 tablet 0  . fexofenadine (ALLEGRA ALLERGY) 180 MG tablet Take 1 tablet (180 mg total) by mouth daily. 90 tablet 4  . fluticasone (FLONASE) 50 MCG/ACT nasal spray Place 2 sprays into both nostrils daily. 16 g 3  . hydrOXYzine (VISTARIL) 25 MG capsule TAKE 1 CAPSULE (25 MG TOTAL) BY MOUTH 3 (THREE) TIMES DAILY AS NEEDED. 90 capsule 1  . lamoTRIgine (LAMICTAL) 150 MG tablet TAKE 1 TABLET BY MOUTH EVERY DAY 30 tablet 1  . Multiple Vitamin (MULTI-VITAMINS) TABS Take by mouth.    . Natalizumab (TYSABRI IV) Inject into the vein.    Marland Kitchen QUEtiapine (SEROQUEL) 50 MG tablet Take 1 tablet (50 mg total) by mouth at bedtime. 30 tablet 0  . tiZANidine (ZANAFLEX) 4 MG tablet Take 4 mg by mouth 2 (two) times daily as needed.  2  . triamcinolone cream (KENALOG) 0.5 % Apply 1 application topically 2 (two) times daily. To affected  areas. 30 g 3  . zolpidem (AMBIEN) 10 MG tablet TAKE 1 TABLET BY MOUTH EVERYDAY AT BEDTIME 30 tablet 3   No current facility-administered medications for this visit.    Allergies  Allergen Reactions  . Ketamine Anaphylaxis  . Oxycodone Diarrhea  . Papaya Enzyme Anaphylaxis     Discussed warning signs or symptoms. Please see discharge instructions. Patient expresses understanding.

## 2018-07-06 NOTE — Patient Instructions (Signed)
Thank you for coming in today. Call or go to the ER if you develop a large red swollen joint with extreme pain or oozing puss.   Continue home exercise and therapy program for plantar fasciitis.  Let me know if you want to do the right foot.   For insomnia increase seroquel to 50mg  at night.   Recheck with Jade in about 1 month.

## 2018-07-07 DIAGNOSIS — R5383 Other fatigue: Secondary | ICD-10-CM | POA: Diagnosis not present

## 2018-07-07 DIAGNOSIS — F319 Bipolar disorder, unspecified: Secondary | ICD-10-CM | POA: Diagnosis not present

## 2018-07-07 DIAGNOSIS — G35 Multiple sclerosis: Secondary | ICD-10-CM | POA: Diagnosis not present

## 2018-07-13 ENCOUNTER — Other Ambulatory Visit: Payer: Self-pay

## 2018-07-13 DIAGNOSIS — L299 Pruritus, unspecified: Secondary | ICD-10-CM

## 2018-07-13 MED ORDER — HYDROXYZINE PAMOATE 25 MG PO CAPS: 25.0000 mg | ORAL_CAPSULE | Freq: Three times a day (TID) | ORAL | 1 refills | Status: DC | PRN

## 2018-07-17 ENCOUNTER — Other Ambulatory Visit (HOSPITAL_COMMUNITY): Payer: Self-pay | Admitting: Psychiatry

## 2018-07-26 ENCOUNTER — Ambulatory Visit (INDEPENDENT_AMBULATORY_CARE_PROVIDER_SITE_OTHER): Payer: Medicare Other | Admitting: Family Medicine

## 2018-07-26 VITALS — BP 119/86 | HR 80 | Ht 68.5 in | Wt 194.0 lb

## 2018-07-26 DIAGNOSIS — M25561 Pain in right knee: Secondary | ICD-10-CM

## 2018-07-26 DIAGNOSIS — M722 Plantar fascial fibromatosis: Secondary | ICD-10-CM | POA: Diagnosis not present

## 2018-07-26 MED ORDER — HYDROCODONE-ACETAMINOPHEN 5-325 MG PO TABS: 1.0000 | ORAL_TABLET | Freq: Four times a day (QID) | ORAL | 0 refills | Status: DC | PRN

## 2018-07-26 NOTE — Patient Instructions (Signed)
Thank you for coming in today. Call or go to the ER if you develop a large red swollen joint with extreme pain or oozing puss.  Recheck if not improving.    If foot not better its time to think planar fascia surgery.    Meniscus Tear A meniscus tear is a knee injury in which a piece of the meniscus is torn. The meniscus is a thick, rubbery, wedge-shaped cartilage in the knee. Two menisci are located in each knee. They sit between the upper bone (femur) and lower bone (tibia) that make up the knee joint. Each meniscus acts as a shock absorber for the knee. A torn meniscus is one of the most common types of knee injuries. This injury can range from mild to severe. Surgery may be needed for a severe tear. What are the causes? This injury may be caused by any squatting, twisting, or pivoting movement. Sports-related injuries are the most common cause. These often occur from:  Running and stopping suddenly.  Changing direction.  Being tackled or knocked off your feet.  As people get older, their meniscus gets thinner and weaker. In these people, tears can happen more easily, such as from climbing stairs. What increases the risk? This injury is more likely to happen to:  People who play contact sports.  Males.  People who are 69?52 years of age.  What are the signs or symptoms? Symptoms of this injury include:  Knee pain, especially at the side of the knee joint. You may feel pain when the injury occurs, or you may only hear a pop and feel pain later.  A feeling that your knee is clicking, catching, locking, or giving way.  Not being able to fully bend or extend your knee.  Bruising or swelling in your knee.  How is this diagnosed? This injury may be diagnosed based on your symptoms and a physical exam. The physical exam may include:  Moving your knee in different ways.  Feeling for tenderness.  Listening for a clicking sound.  Checking if your knee locks or  catches.  You may also have tests, such as:  X-rays.  MRI.  A procedure to look inside your knee with a narrow surgical telescope (arthroscopy).  You may be referred to a knee specialist (orthopedic surgeon). How is this treated? Treatment for this injury depends on the severity of the tear. Treatment for a mild tear may include:  Rest.  Medicine to reduce pain and swelling. This is usually a nonsteroidal anti-inflammatory drug (NSAID).  A knee brace or an elastic sleeve or wrap.  Using crutches or a walker to keep weight off your knee and to help you walk.  Exercises to strengthen your knee (physical therapy).  You may need surgery if you have a severe tear or if other treatments are not working. Follow these instructions at home: Managing pain and swelling  Take over-the-counter and prescription medicines only as told by your health care provider.  If directed, apply ice to the injured area: ? Put ice in a plastic bag. ? Place a towel between your skin and the bag. ? Leave the ice on for 20 minutes, 2-3 times per day.  Raise (elevate) the injured area above the level of your heart while you are sitting or lying down. Activity  Do not use the injured limb to support your body weight until your health care provider says that you can. Use crutches or a walker as told by your health care provider.  Return to your normal activities as told by your health care provider. Ask your health care provider what activities are safe for you.  Perform range-of-motion exercises only as told by your health care provider.  Begin doing exercises to strengthen your knee and leg muscles only as told by your health care provider. After you recover, your health care provider may recommend these exercises to help prevent another injury. General instructions  Use a knee brace or elastic wrap as told by your health care provider.  Keep all follow-up visits as told by your health care  provider. This is important. Contact a health care provider if:  You have a fever.  Your knee becomes red, tender, or swollen.  Your pain medicine is not helping.  Your symptoms get worse or do not improve after 2 weeks of home care. This information is not intended to replace advice given to you by your health care provider. Make sure you discuss any questions you have with your health care provider. Document Released: 01/16/2003 Document Revised: 04/02/2016 Document Reviewed: 02/18/2015 Elsevier Interactive Patient Education  [employer].

## 2018-07-27 DIAGNOSIS — G35 Multiple sclerosis: Secondary | ICD-10-CM | POA: Diagnosis not present

## 2018-07-27 NOTE — Progress Notes (Signed)
Paige Lozano is a 52 y.o. female who presents to [facility] today for right knee pain and bilateral calcaneal pain.  Paige Lozano was seen August 28 for bilateral plantar fasciitis left worse than right.  At that time she had a plantar fascial injection bilaterally which did not help at all.  She notes continued foot pain.  She notes her left foot is quite bothersome currently.  However significant is right knee pain.  On October 31 she felt a pop in her knee with twisting and developed pain in her right knee.  Pain is worse with activity and somewhat better with rest.  She is developed a small effusion as well.  She is tried over-the-counter medications which helped.  She notes pain is quite bothersome.    ROS:  As above  Exam:  BP 119/86   Pulse 80   Ht 5' 8.5" (1.74 m)   Wt 194 lb (88 kg)   BMI 29.07 kg/m  General: Well Developed, well nourished, and in no acute distress.  Neuro/Psych: Alert and oriented x3, extra-ocular muscles intact, able to move all 4 extremities, sensation grossly intact. Skin: Warm and dry, no rashes noted.  Respiratory: Not using accessory muscles, speaking in full sentences, trachea midline.  Cardiovascular: Pulses palpable, no extremity edema. Abdomen: Does not appear distended. MSK:  Right knee: Small effusion otherwise normal-appearing Range of motion 5-120 degrees. Tender to palpation medial joint line. Stable ligamentous exam. Mildly positive medial McMurray's test. Intact flexion and extension strength.  Feet bilaterally normal-appearing tender palpation calcaneus.  Antalgic gait present.    Lab and Radiology Results Procedure: Real-time Ultrasound Guided Injection of right knee  Device: GE Logiq E   Images permanently stored and available for review in the ultrasound unit. Verbal informed consent obtained.  Discussed risks and benefits of procedure. Warned about infection bleeding damage to  structures skin hypopigmentation and fat atrophy among others. Patient expresses understanding and agreement Time-out conducted.   Noted no overlying erythema, induration, or other signs of local infection.   Skin prepped in a sterile fashion.   Local anesthesia: Topical Ethyl chloride.   With sterile technique and under real time ultrasound guidance:  80mg  depomedrol and 35ml marcaine injected easily.   Completed without difficulty   Pain partially resolved suggesting accurate placement of the medication.   Advised to call if fevers/chills, erythema, induration, drainage, or persistent bleeding.   Images permanently stored and available for review in the ultrasound unit.  Impression: Technically successful ultrasound guided injection.         Assessment and Plan: 52 y.o. female with  Right knee pain concerning for degenerative meniscus injury.  Status post injection today.  Plan for relative rest and recheck if not improving.  Foot pain bilaterally worse than right.  Concerning for worsening plantar fasciitis.  I suspect her plantar fascial pain has been worse because she is limping because of her right knee.  If not improving following the injection she is at this point is failing conservative management and may benefit from surgical consultation.  Recheck in a few weeks if not improving.  Limited hydrocodone for pain control temporarily. Patient researched [facility] Controlled Substance Reporting System.    No orders of the defined types were placed in this encounter.  Meds ordered this encounter  Medications  . HYDROcodone-acetaminophen (NORCO/VICODIN) 5-325 MG tablet    Sig: Take 1 tablet by mouth every 6 (six) hours as needed.    Dispense:  15 tablet    Refill:  0    Historical information moved to improve visibility of documentation.  Past Medical History:  Diagnosis Date  . Anxiety   . Bipolar 2 disorder, major depressive episode ([place])   . Complication of  anesthesia   . COPD (chronic obstructive pulmonary disease) ([place])    smoker  . Fibromyalgia   . Insomnia   . Multiple sclerosis ([place])   . PONV (postoperative nausea and vomiting)   . Pulmonary embolism ([place]) 2000  . Sleep apnea    mild OSA no CPAP  . Ulcerative colitis [facility])    Past Surgical History:  Procedure Laterality Date  . FOOT SURGERY Left    bone spur  . HEMORRHOID SURGERY    . HUMERUS FRACTURE SURGERY Left   . MYOMECTOMY    . NASAL SINUS SURGERY    . SHOULDER ARTHROSCOPY WITH SUBACROMIAL DECOMPRESSION AND BICEP TENDON REPAIR Left 07/27/2016   Procedure: SHOULDER ARTHROSCOPY DEBRIDEMENT ROTATOR CUFF AND LABRUM, SUBACROMIAL DECOMPRESSION, BICEPS TENODESIS;  Surgeon: Tania Ade, MD;  Location: [facility];  Service: Orthopedics;  Laterality: Left;  SHOULDER ARTHROSCOPY DEBRIDEMENT ROTATOR CUFF AND LABRUM, SUBACROMIAL DECOMPRESSION, BICEPS TENOTOMY, POSSIBLE TENODESIS  . THUMB ARTHROSCOPY     5 surgeries on left thumb, 4 surgeries on right  . TONSILLECTOMY AND ADENOIDECTOMY     Social History   Tobacco Use  . Smoking status: Former Smoker    Packs/day: 0.25    Types: Cigarettes    Last attempt to quit: 07/08/2016    Years since quitting: 2.0  . Smokeless tobacco: Never Used  Substance Use Topics  . Alcohol use: Yes    Alcohol/week: 0.0 standard drinks    Comment: rare    family history is not on file. She was adopted.  Medications: Current Outpatient Medications  Medication Sig Dispense Refill  . acetaminophen (TYLENOL) 650 MG CR tablet Take 1 tablet (650 mg total) by mouth every 8 (eight) hours as needed for pain. 90 tablet 3  . albuterol (PROVENTIL HFA;VENTOLIN HFA) 108 (90 Base) MCG/ACT inhaler Inhale 2 puffs into the lungs every 6 (six) hours as needed for wheezing or shortness of breath. 1 Inhaler 0  . balsalazide (COLAZAL) 750 MG capsule TAKE 3 CAPSULES (2,250 MG TOTAL) BY MOUTH 3 (THREE) TIMES DAILY. 270 capsule 0  . Cholecalciferol  (VITAMIN D-1000 MAX ST) 1000 units tablet Take by mouth.    . cyclobenzaprine (FLEXERIL) 10 MG tablet TAKE 1/2 (ONE-HALF) TABLET BY MOUTH AT BEDTIME, THEN  INCREASE  GRADUALLY  TO  1  TABLET  3  TIMES  DAILY 30 tablet 0  . fexofenadine (ALLEGRA ALLERGY) 180 MG tablet Take 1 tablet (180 mg total) by mouth daily. 90 tablet 4  . fluticasone (FLONASE) 50 MCG/ACT nasal spray Place 2 sprays into both nostrils daily. 16 g 3  . hydrOXYzine (VISTARIL) 25 MG capsule Take 1 capsule (25 mg total) by mouth 3 (three) times daily as needed. 90 capsule 1  . lamoTRIgine (LAMICTAL) 150 MG tablet TAKE 1 TABLET BY MOUTH EVERY DAY 30 tablet 1  . Multiple Vitamin (MULTI-VITAMINS) TABS Take by mouth.    . Natalizumab (TYSABRI IV) Inject into the vein.    Marland Kitchen QUEtiapine (SEROQUEL) 50 MG tablet Take 1 tablet (50 mg total) by mouth at bedtime. 30 tablet 0  . tiZANidine (ZANAFLEX) 4 MG tablet Take 4 mg by mouth 2 (two) times daily as needed.  2  . triamcinolone cream (KENALOG) 0.5 % Apply 1  application topically 2 (two) times daily. To affected areas. 30 g 3  . zolpidem (AMBIEN) 10 MG tablet TAKE 1 TABLET BY MOUTH EVERYDAY AT BEDTIME 30 tablet 3  . HYDROcodone-acetaminophen (NORCO/VICODIN) 5-325 MG tablet Take 1 tablet by mouth every 6 (six) hours as needed. 15 tablet 0   No current facility-administered medications for this visit.    Allergies  Allergen Reactions  . Ketamine Anaphylaxis  . Oxycodone Diarrhea  . Papaya Enzyme Anaphylaxis      Discussed warning signs or symptoms. Please see discharge instructions. Patient expresses understanding.

## 2018-07-28 ENCOUNTER — Other Ambulatory Visit: Payer: Self-pay | Admitting: Family Medicine

## 2018-07-28 ENCOUNTER — Other Ambulatory Visit (HOSPITAL_COMMUNITY): Payer: Self-pay | Admitting: Psychiatry

## 2018-07-28 DIAGNOSIS — F5101 Primary insomnia: Secondary | ICD-10-CM

## 2018-07-29 ENCOUNTER — Telehealth: Payer: Self-pay | Admitting: Physician Assistant

## 2018-07-29 NOTE — Telephone Encounter (Signed)
Patient states she called on Wednesday 9/18 and left a lengthy voicemail on Rhonda's line to let you know that she blew-out her knee again while in the pool after seeing you on 9/17 Tuesday.  She was trying to figure out next steps you would like to take BEFORE the weekend got here and states that's why she called on Wednesday, but never got a phone call back from anyone. Now patient has called back on a Friday at 2:30 Please touch base with patient on this issue. Thanks

## 2018-08-01 ENCOUNTER — Ambulatory Visit (INDEPENDENT_AMBULATORY_CARE_PROVIDER_SITE_OTHER): Payer: Medicare Other | Admitting: Family Medicine

## 2018-08-01 ENCOUNTER — Encounter: Payer: Self-pay | Admitting: Family Medicine

## 2018-08-01 ENCOUNTER — Ambulatory Visit (INDEPENDENT_AMBULATORY_CARE_PROVIDER_SITE_OTHER): Payer: Medicare Other

## 2018-08-01 VITALS — BP 115/66 | HR 91 | Wt 194.0 lb

## 2018-08-01 DIAGNOSIS — M25562 Pain in left knee: Secondary | ICD-10-CM

## 2018-08-01 DIAGNOSIS — M25561 Pain in right knee: Secondary | ICD-10-CM | POA: Diagnosis not present

## 2018-08-01 MED ORDER — HYDROCODONE-ACETAMINOPHEN 5-325 MG PO TABS: 1.0000 | ORAL_TABLET | Freq: Four times a day (QID) | ORAL | 0 refills | Status: DC | PRN

## 2018-08-01 NOTE — Patient Instructions (Signed)
Thank you for coming in today. You should hear about MRI soon.  Let me know if you do not hear anything.

## 2018-08-01 NOTE — Progress Notes (Signed)
Paige Lozano is a 52 y.o. female who presents to [name] today for right knee pain.  Mickel Baas was seen last week for severe right knee pain.  She described a twisting pop injury without significant fall a few weeks preceding her presentation.  She notes continued pain and swelling.  On 17 September she received a steroid injection which helped a little but not sufficiently.  She notes continued popping giving way catching and pain in the lateral aspect of her knee.  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  She has continued pain and swelling.   ROS as above:  Exam:  BP 115/66   Pulse 91   Wt 194 lb (88 kg)   BMI 29.07 kg/m  Wt Readings from Last 5 Encounters:  08/01/18 194 lb (88 kg)  07/26/18 194 lb (88 kg)  07/06/18 191 lb (86.6 kg)  05/23/18 197 lb (89.4 kg)  04/07/18 195 lb (88.5 kg)    Gen: Well NAD HEENT: EOMI,  MMM Lungs: Normal work of breathing. CTABL Heart: RRR no MRG Abd: NABS, Soft. Nondistended, Nontender Exts: Brisk capillary refill, warm and well perfused.  Right knee: No deformity mild effusion no erythema. Range of motion 5-120 degrees. Tender palpation lateral joint line. Stable ligamentous exam to anterior posterior drawer testing.  Pain but no laxity with LCL stress testing.  No pain or laxity with MCL stress testing. Positive lateral McMurray's test negative medially. Intact flexion and extension strength.  Lab and Radiology Results No results found for this or any previous visit (from the past 72 hour(s)). Dg Knee 1-2 Views Left  Result Date: 08/01/2018 CLINICAL DATA:  Knee pain. EXAM: LEFT KNEE - 1-2 VIEW COMPARISON:  Radiographs of June 01, 2016. FINDINGS: No evidence of fracture or dislocation. No evidence of arthropathy or other focal bone abnormality. Soft tissues are  unremarkable. IMPRESSION: No definite abnormality seen in the left knee. Electronically Signed   By: Marijo Conception, M.D.   On: 08/01/2018 13:36   Dg Knee Complete 4 Views Right  Result Date: 08/01/2018 CLINICAL DATA:  Right knee pain after injury last month. EXAM: RIGHT KNEE - COMPLETE 4+ VIEW COMPARISON:  None. FINDINGS: No evidence of fracture, dislocation, or joint effusion. No evidence of arthropathy or other focal bone abnormality. Soft tissues are unremarkable. IMPRESSION: Normal right knee. Electronically Signed   By: Marijo Conception, M.D.   On: 08/01/2018 13:35   I personally (independently) visualized and performed the interpretation of the images attached in this note.    Assessment and Plan: 52 y.o. female with knee pain with significant mechanical symptoms significantly affecting quality of life.  Failing initial trial of conservative management.  Plan for hinged knee brace and MRI.  Recheck after MRI.  Concern for ACL, lateral meniscus injury.  Return sooner if needed.   Orders Placed This Encounter  Procedures  . DG Knee Complete 4 Views Right    Please include patellar sunrise, lateral, and weightbearing bilateral AP and bilateral rosenberg views    Standing Status:   Future    Number of Occurrences:   1    Standing Expiration Date:   10/01/2019    Order Specific Question:   Reason for exam:    Answer:   Please include patellar sunrise, lateral, and weightbearing bilateral AP and bilateral rosenberg views    Comments:   Please include  patellar sunrise, lateral, and weightbearing bilateral AP and bilateral rosenberg views    Order Specific Question:   Preferred imaging location?    Answer:   [facility]  . DG Knee 1-2 Views Left    Standing Status:   Future    Number of Occurrences:   1    Standing Expiration Date:   10/02/2019    Order Specific Question:   Reason for Exam (SYMPTOM  OR DIAGNOSIS REQUIRED)    Answer:   For use with right knee x-ray, bilateral  AP and Rosenberg standing.    Order Specific Question:   Is the patient pregnant?    Answer:   No    Order Specific Question:   Preferred imaging location?    Answer:   [facility]  . MR Knee Right Wo Contrast    Standing Status:   Future    Standing Expiration Date:   10/02/2019    Order Specific Question:   What is the patient's sedation requirement?    Answer:   Anti-anxiety    Order Specific Question:   Does the patient have a pacemaker or implanted devices?    Answer:   No    Order Specific Question:   Preferred imaging location?    Answer:   [facility] (table limit-350lbs)    Order Specific Question:   Radiology Contrast Protocol - do NOT remove file path    Answer:   [email or web address]   Meds ordered this encounter  Medications  . HYDROcodone-acetaminophen (NORCO/VICODIN) 5-325 MG tablet    Sig: Take 1 tablet by mouth every 6 (six) hours as needed.    Dispense:  15 tablet    Refill:  0     Historical information moved to improve visibility of documentation.  Past Medical History:  Diagnosis Date  . Anxiety   . Bipolar 2 disorder, major depressive episode ([place])   . Complication of anesthesia   . COPD (chronic obstructive pulmonary disease) ([place])    smoker  . Fibromyalgia   . Insomnia   . Multiple sclerosis ([place])   . PONV (postoperative nausea and vomiting)   . Pulmonary embolism ([place]) 2000  . Sleep apnea    mild OSA no CPAP  . Ulcerative colitis [facility])    Past Surgical History:  Procedure Laterality Date  . FOOT SURGERY Left    bone spur  . HEMORRHOID SURGERY    . HUMERUS FRACTURE SURGERY Left   . MYOMECTOMY    . NASAL SINUS SURGERY    . SHOULDER ARTHROSCOPY WITH SUBACROMIAL DECOMPRESSION AND BICEP TENDON REPAIR Left 07/27/2016   Procedure: SHOULDER ARTHROSCOPY DEBRIDEMENT ROTATOR CUFF AND LABRUM, SUBACROMIAL DECOMPRESSION, BICEPS TENODESIS;  Surgeon: Tania Ade, MD;  Location: [facility];   Service: Orthopedics;  Laterality: Left;  SHOULDER ARTHROSCOPY DEBRIDEMENT ROTATOR CUFF AND LABRUM, SUBACROMIAL DECOMPRESSION, BICEPS TENOTOMY, POSSIBLE TENODESIS  . THUMB ARTHROSCOPY     5 surgeries on left thumb, 4 surgeries on right  . TONSILLECTOMY AND ADENOIDECTOMY     Social History   Tobacco Use  . Smoking status: Former Smoker    Packs/day: 0.25    Types: Cigarettes    Last attempt to quit: 07/08/2016    Years since quitting: 2.0  . Smokeless tobacco: Never Used  Substance Use Topics  . Alcohol use: Yes    Alcohol/week: 0.0 standard drinks    Comment: rare    family history is not on file. She was adopted.  Medications: Current Outpatient Medications  Medication  Sig Dispense Refill  . acetaminophen (TYLENOL) 650 MG CR tablet Take 1 tablet (650 mg total) by mouth every 8 (eight) hours as needed for pain. 90 tablet 3  . albuterol (PROVENTIL HFA;VENTOLIN HFA) 108 (90 Base) MCG/ACT inhaler Inhale 2 puffs into the lungs every 6 (six) hours as needed for wheezing or shortness of breath. 1 Inhaler 0  . balsalazide (COLAZAL) 750 MG capsule TAKE 3 CAPSULES (2,250 MG TOTAL) BY MOUTH 3 (THREE) TIMES DAILY. 270 capsule 0  . Cholecalciferol (VITAMIN D-1000 MAX ST) 1000 units tablet Take by mouth.    . cyclobenzaprine (FLEXERIL) 10 MG tablet TAKE 1/2 (ONE-HALF) TABLET BY MOUTH AT BEDTIME, THEN  INCREASE  GRADUALLY  TO  1  TABLET  3  TIMES  DAILY 30 tablet 0  . fexofenadine (ALLEGRA ALLERGY) 180 MG tablet Take 1 tablet (180 mg total) by mouth daily. 90 tablet 4  . fluticasone (FLONASE) 50 MCG/ACT nasal spray Place 2 sprays into both nostrils daily. 16 g 3  . HYDROcodone-acetaminophen (NORCO/VICODIN) 5-325 MG tablet Take 1 tablet by mouth every 6 (six) hours as needed. 15 tablet 0  . hydrOXYzine (VISTARIL) 25 MG capsule Take 1 capsule (25 mg total) by mouth 3 (three) times daily as needed. 90 capsule 1  . lamoTRIgine (LAMICTAL) 150 MG tablet TAKE 1 TABLET BY MOUTH EVERY DAY 30 tablet 1  .  Multiple Vitamin (MULTI-VITAMINS) TABS Take by mouth.    . Natalizumab (TYSABRI IV) Inject into the vein.    Marland Kitchen QUEtiapine (SEROQUEL) 50 MG tablet Take 1 tablet (50 mg total) by mouth at bedtime. 30 tablet 0  . tiZANidine (ZANAFLEX) 4 MG tablet Take 4 mg by mouth 2 (two) times daily as needed.  2  . triamcinolone cream (KENALOG) 0.5 % Apply 1 application topically 2 (two) times daily. To affected areas. 30 g 3  . zolpidem (AMBIEN) 10 MG tablet TAKE 1 TABLET BY MOUTH EVERYDAY AT BEDTIME 30 tablet 3   No current facility-administered medications for this visit.    Allergies  Allergen Reactions  . Ketamine Anaphylaxis  . Oxycodone Diarrhea  . Papaya Enzyme Anaphylaxis     Discussed warning signs or symptoms. Please see discharge instructions. Patient expresses understanding.

## 2018-08-01 NOTE — Telephone Encounter (Signed)
Discussed with patient 08/01/18

## 2018-08-03 DIAGNOSIS — S7011XA Contusion of right thigh, initial encounter: Secondary | ICD-10-CM | POA: Diagnosis not present

## 2018-08-03 DIAGNOSIS — M94261 Chondromalacia, right knee: Secondary | ICD-10-CM | POA: Diagnosis not present

## 2018-08-03 DIAGNOSIS — S83511A Sprain of anterior cruciate ligament of right knee, initial encounter: Secondary | ICD-10-CM | POA: Diagnosis not present

## 2018-08-03 DIAGNOSIS — M25461 Effusion, right knee: Secondary | ICD-10-CM | POA: Diagnosis not present

## 2018-08-03 DIAGNOSIS — M25561 Pain in right knee: Secondary | ICD-10-CM | POA: Diagnosis not present

## 2018-08-04 ENCOUNTER — Encounter (HOSPITAL_COMMUNITY): Payer: Self-pay | Admitting: Psychiatry

## 2018-08-04 ENCOUNTER — Telehealth: Payer: Self-pay

## 2018-08-04 ENCOUNTER — Encounter: Payer: Self-pay | Admitting: Physician Assistant

## 2018-08-04 ENCOUNTER — Ambulatory Visit (INDEPENDENT_AMBULATORY_CARE_PROVIDER_SITE_OTHER): Payer: Medicare Other | Admitting: Physician Assistant

## 2018-08-04 ENCOUNTER — Ambulatory Visit (INDEPENDENT_AMBULATORY_CARE_PROVIDER_SITE_OTHER): Payer: Medicare Other | Admitting: Psychiatry

## 2018-08-04 VITALS — BP 111/65 | HR 96 | Ht 68.5 in | Wt 193.0 lb

## 2018-08-04 VITALS — BP 122/86 | HR 92 | Ht 68.5 in | Wt 193.0 lb

## 2018-08-04 DIAGNOSIS — Z23 Encounter for immunization: Secondary | ICD-10-CM

## 2018-08-04 DIAGNOSIS — F5102 Adjustment insomnia: Secondary | ICD-10-CM | POA: Diagnosis not present

## 2018-08-04 DIAGNOSIS — F3181 Bipolar II disorder: Secondary | ICD-10-CM

## 2018-08-04 DIAGNOSIS — Z13220 Encounter for screening for lipoid disorders: Secondary | ICD-10-CM | POA: Diagnosis not present

## 2018-08-04 DIAGNOSIS — Z1231 Encounter for screening mammogram for malignant neoplasm of breast: Secondary | ICD-10-CM

## 2018-08-04 DIAGNOSIS — F411 Generalized anxiety disorder: Secondary | ICD-10-CM | POA: Diagnosis not present

## 2018-08-04 DIAGNOSIS — M25561 Pain in right knee: Secondary | ICD-10-CM

## 2018-08-04 DIAGNOSIS — F5101 Primary insomnia: Secondary | ICD-10-CM | POA: Diagnosis not present

## 2018-08-04 DIAGNOSIS — F4310 Post-traumatic stress disorder, unspecified: Secondary | ICD-10-CM

## 2018-08-04 DIAGNOSIS — F1290 Cannabis use, unspecified, uncomplicated: Secondary | ICD-10-CM

## 2018-08-04 DIAGNOSIS — Z87891 Personal history of nicotine dependence: Secondary | ICD-10-CM

## 2018-08-04 DIAGNOSIS — Z131 Encounter for screening for diabetes mellitus: Secondary | ICD-10-CM | POA: Diagnosis not present

## 2018-08-04 MED ORDER — QUETIAPINE FUMARATE 100 MG PO TABS: ORAL_TABLET | ORAL | 1 refills | Status: DC

## 2018-08-04 MED ORDER — LAMOTRIGINE 100 MG PO TABS: 100.0000 mg | ORAL_TABLET | Freq: Two times a day (BID) | ORAL | 1 refills | Status: DC

## 2018-08-04 NOTE — Telephone Encounter (Signed)
Patient brought her MRI in today from [place] and she stated that her knee is buckling on her and causing her pain. She wants to know what her results are and what"s the next step. Please advise. Garrett Bowring,CMA

## 2018-08-04 NOTE — Telephone Encounter (Signed)
I spoke with Paige Lozano about her MRI results showing femoral condyl fracture and ACL tear. Plan for referral to orthopedics.  Have

## 2018-08-04 NOTE — Telephone Encounter (Signed)
Patient called triage line crying stating " her knee double buckled on her and in pain and wants to know what to do and wants to know if you have read the MRI results. Please call her. KG LPN

## 2018-08-04 NOTE — Progress Notes (Signed)
[facility] Outpatient Follow up visit   Patient Identification: Paige Lozano MRN:  [id number] Date of Evaluation:  08/04/2018 Referral Source: [facility] Chief Complaint:   Chief Complaint    Follow-up; Depression     Visit Diagnosis:    ICD-10-CM   1. Bipolar 2 disorder (HCC) F31.81   2. PTSD (post-traumatic stress disorder) F43.10   3. GAD (generalized anxiety disorder) F41.1   4. Adjustment insomnia F51.02     History of Present Illness:  52 years old Returns for follow-up and medication management  Increase in the lamictal  helped some but she still has mood swings.  She went to [facility] to visit family she is still going through [place] treatment and the last medication had to be stopped because of blood work  Seen neurology for MS and fatigue.    No rash has some anxiety and overwhelming feeling at times because of the multiple stressors in multiple surgeries that she has to go through the last couple of years carries a Xanax but now carries a Klonopin instead of Xanax that helps but she does not take it regularly.   Aggravating factor: surgeries . Multiple medical .surgeries Modifying factor: dogs  Duration more then 10 years     Previous Psychotropic Medications: Yes   Substance Abuse History in the last 12 months:  Yes.   as per history Marijuana says it is medical for her condition.  Consequences of Substance Abuse: Medical Consequences:  fatigue, poor concenctration  Past Medical History:  Past Medical History:  Diagnosis Date  . Anxiety   . Bipolar 2 disorder, major depressive episode ([place])   . Complication of anesthesia   . COPD (chronic obstructive pulmonary disease) ([place])    smoker  . Fibromyalgia   . Insomnia   . Multiple sclerosis ([place])   . PONV (postoperative nausea and vomiting)   . Pulmonary embolism ([place]) 2000  . Sleep apnea    mild OSA no CPAP  . Ulcerative colitis [facility])     Past Surgical History:  Procedure Laterality Date  . FOOT SURGERY  Left    bone spur  . HEMORRHOID SURGERY    . HUMERUS FRACTURE SURGERY Left   . MYOMECTOMY    . NASAL SINUS SURGERY    . SHOULDER ARTHROSCOPY WITH SUBACROMIAL DECOMPRESSION AND BICEP TENDON REPAIR Left 07/27/2016   Procedure: SHOULDER ARTHROSCOPY DEBRIDEMENT ROTATOR CUFF AND LABRUM, SUBACROMIAL DECOMPRESSION, BICEPS TENODESIS;  Surgeon: Tania Ade, MD;  Location: [place];  Service: Orthopedics;  Laterality: Left;  SHOULDER ARTHROSCOPY DEBRIDEMENT ROTATOR CUFF AND LABRUM, SUBACROMIAL DECOMPRESSION, BICEPS TENOTOMY, POSSIBLE TENODESIS  . THUMB ARTHROSCOPY     5 surgeries on left thumb, 4 surgeries on right  . TONSILLECTOMY AND ADENOIDECTOMY       Family History:  Family History  Adopted: Yes    Social History:   Social History   Socioeconomic History  . Marital status: Divorced    Spouse name: Not on file  . Number of children: Not on file  . Years of education: Not on file  . Highest education level: Not on file  Occupational History  . Not on file  Social Needs  . Financial resource strain: Not on file  . Food insecurity:    Worry: Not on file    Inability: Not on file  . Transportation needs:    Medical: Not on file    Non-medical: Not on file  Tobacco Use  . Smoking status: Former Smoker  Packs/day: 0.25    Types: Cigarettes    Last attempt to quit: 07/08/2016    Years since quitting: 2.0  . Smokeless tobacco: Never Used  Substance and Sexual Activity  . Alcohol use: Yes    Alcohol/week: 0.0 standard drinks    Comment: rare   . Drug use: No    Types: Marijuana    Comment: last use 5 month ago  . Sexual activity: Yes    Partners: Male    Birth control/protection: None  Lifestyle  . Physical activity:    Days per week: Not on file    Minutes per session: Not on file  . Stress: Not on file  Relationships  . Social connections:    Talks on phone: Not on file    Gets together: Not on file    Attends religious service: Not on file     Active member of club or organization: Not on file    Attends meetings of clubs or organizations: Not on file    Relationship status: Not on file  Other Topics Concern  . Not on file  Social History Narrative  . Not on file     Allergies:   Allergies  Allergen Reactions  . Ketamine Anaphylaxis  . Oxycodone Diarrhea  . Papaya Enzyme Anaphylaxis    Metabolic Disorder Labs: No results found for: HGBA1C, MPG No results found for: PROLACTIN No results found for: CHOL, TRIG, HDL, CHOLHDL, VLDL, LDLCALC   Current Medications: Current Outpatient Medications  Medication Sig Dispense Refill  . acetaminophen (TYLENOL) 650 MG CR tablet Take 1 tablet (650 mg total) by mouth every 8 (eight) hours as needed for pain. 90 tablet 3  . albuterol (PROVENTIL HFA;VENTOLIN HFA) 108 (90 Base) MCG/ACT inhaler Inhale 2 puffs into the lungs every 6 (six) hours as needed for wheezing or shortness of breath. 1 Inhaler 0  . balsalazide (COLAZAL) 750 MG capsule TAKE 3 CAPSULES (2,250 MG TOTAL) BY MOUTH 3 (THREE) TIMES DAILY. 270 capsule 0  . Cholecalciferol (VITAMIN D-1000 MAX ST) 1000 units tablet Take by mouth.    . clonazePAM (KLONOPIN) 0.5 MG tablet Take 0.5 mg by mouth 2 (two) times daily. prn    . fexofenadine (ALLEGRA ALLERGY) 180 MG tablet Take 1 tablet (180 mg total) by mouth daily. 90 tablet 4  . fluticasone (FLONASE) 50 MCG/ACT nasal spray Place 2 sprays into both nostrils daily. 16 g 3  . folic acid (FOLVITE) 1 MG tablet Take 1 mg by mouth daily.    Marland Kitchen HYDROcodone-acetaminophen (NORCO/VICODIN) 5-325 MG tablet Take 1 tablet by mouth every 6 (six) hours as needed. 15 tablet 0  . hydrOXYzine (VISTARIL) 25 MG capsule Take 1 capsule (25 mg total) by mouth 3 (three) times daily as needed. 90 capsule 1  . Multiple Vitamin (MULTI-VITAMINS) TABS Take by mouth.    . QUEtiapine (SEROQUEL) 50 MG tablet Take 1 tablet (50 mg total) by mouth at bedtime. 30 tablet 0  . tiZANidine (ZANAFLEX) 4 MG tablet Take 4 mg  by mouth 2 (two) times daily as needed.  2  . cyclobenzaprine (FLEXERIL) 10 MG tablet TAKE 1/2 (ONE-HALF) TABLET BY MOUTH AT BEDTIME, THEN  INCREASE  GRADUALLY  TO  1  TABLET  3  TIMES  DAILY (Patient not taking: Reported on 08/04/2018) 30 tablet 0  . lamoTRIgine (LAMICTAL) 100 MG tablet Take 1 tablet (100 mg total) by mouth 2 (two) times daily. 60 tablet 1  . Natalizumab (TYSABRI IV) Inject into the vein.    Marland Kitchen  triamcinolone cream (KENALOG) 0.5 % Apply 1 application topically 2 (two) times daily. To affected areas. (Patient not taking: Reported on 08/04/2018) 30 g 3  . zolpidem (AMBIEN) 10 MG tablet TAKE 1 TABLET BY MOUTH EVERYDAY AT BEDTIME (Patient not taking: Reported on 08/04/2018) 30 tablet 3   No current facility-administered medications for this visit.       Psychiatric Specialty Exam: Review of Systems  Cardiovascular: Negative for chest pain.  Musculoskeletal: Positive for myalgias.  Skin: Negative for rash.  Neurological: Negative for tremors and headaches.  Psychiatric/Behavioral: Positive for depression. Negative for suicidal ideas.    Blood pressure 122/86, pulse 92, height 5' 8.5" (1.74 m), weight 193 lb (87.5 kg), SpO2 98 %.Body mass index is 28.92 kg/m.  General Appearance: Casual  Eye Contact:  Fair  Speech:  Normal Rate  Volume:  normal  Mood: subdued  Affect:  congruent  Thought Process:  Goal Directed  Orientation:  Full (Time, Place, and Person)  Thought Content:  Logical  Suicidal Thoughts:  No  Homicidal Thoughts:  No  Memory:  Immediate;   Fair Recent;   Fair  Judgement:  Fair  Insight:  Fair  Psychomotor Activity:  Normal  Concentration:  Concentration: Fair and Attention Span: Fair  Recall:  [employer] of Knowledge:Fair  Language: Fair  Akathisia:  Negative  Handed:  Right  AIMS (if indicated):    Assets:  Desire for Improvement  ADL's:  Intact  Cognition: WNL  Sleep:  Fair while on meds    Treatment Plan Summary: Medication management and  Plan as follows  Bipolar 2:  Feels edgy, overwhelmed at times. incrase lamictal to 100mg  bid. She was taking at night only GAD: stressed, prn klonopine can continue .     Insomnia: fluctuates. Reviewed sleep hygien  avoid phenteramine or stimulants Fu 60m Reviewed concerns   Merian Capron, MD 9/26/201910:22 AM

## 2018-08-05 ENCOUNTER — Telehealth: Payer: Self-pay

## 2018-08-05 ENCOUNTER — Ambulatory Visit: Payer: Self-pay | Admitting: Physician Assistant

## 2018-08-05 DIAGNOSIS — M25561 Pain in right knee: Secondary | ICD-10-CM | POA: Diagnosis not present

## 2018-08-05 DIAGNOSIS — M1711 Unilateral primary osteoarthritis, right knee: Secondary | ICD-10-CM

## 2018-08-05 MED ORDER — HYDROCODONE-ACETAMINOPHEN 5-325 MG PO TABS: 1.0000 | ORAL_TABLET | Freq: Three times a day (TID) | ORAL | 0 refills | Status: DC | PRN

## 2018-08-05 NOTE — Assessment & Plan Note (Signed)
We finished [facility] about a year and a half ago, initially she was doing okay. Recent injury with ACL tear and femoral impaction fracture. Per patient request referral to Dr. Smitty Cords and hydrocodone for pain.

## 2018-08-05 NOTE — Telephone Encounter (Signed)
Judaea was not happy with the Ortho and would like 2 nd opinion. Dr Dianah Field sent the referral and Hydrocodone for the pain.   She needs a work note until she can get in with ortho.

## 2018-08-05 NOTE — Telephone Encounter (Signed)
Referral to Dr. Smitty Cords, hydrocodone for pain in the meantime.

## 2018-08-08 ENCOUNTER — Encounter: Payer: Self-pay | Admitting: Physician Assistant

## 2018-08-08 NOTE — Telephone Encounter (Signed)
I am sorry you had a bad experience.  Let me know if you do not hear anything about a second appointment.

## 2018-08-08 NOTE — Telephone Encounter (Signed)
Are you able to write the work note?

## 2018-08-08 NOTE — Progress Notes (Signed)
Subjective:    Patient ID: Paige Lozano, female    DOB: December 24, 1965, 52 y.o.   MRN: [id number]  HPI Pt is a 52 yo female who presents to the clinic to follow up on insomnia. seroquel is working well but she increased just a little on her own to 125mg  which is working perfectly.   She is overall doing ok despite her right knee problems. She see dr. Georgina Snell for this. She had MRI done and does not know the results at this time. Her knee is giving out and she has fallen multiple times. 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   .. Active Ambulatory Problems    Diagnosis Date Noted  . Multiple sclerosis ([place]) 05/29/2016  . Fibromyalgia 05/29/2016  . 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 Insomnia 05/29/2016  . Ulcerative pancolitis without complication ([place]) 95/62/1308  . Bipolar 2 disorder ([place]) 05/29/2016  . Current smoker 05/29/2016  . Primary osteoarthritis of right knee with recent ACL tear and femoral condylar impaction fracture 06/01/2016  . Anxiety state 06/01/2016  . Left shoulder pain 06/01/2016  . GAD (generalized anxiety disorder) 07/20/2016  . History of pulmonary embolus (PE) 07/21/2016  . Right elbow pain 07/21/2016  . Sinusitis 11/30/2016  . Hot flashes 12/29/2016  . Body aches 12/29/2016  . Pruritus 12/29/2016  . Palpitations 03/08/2017  . Chest tightness 03/08/2017  . Tachycardia 03/08/2017  . Sinus tachycardia 03/12/2017  . PAC (premature atrial contraction) 03/12/2017  . Lumbar degenerative disc disease 06/02/2017  . Plantar fasciitis, bilateral 10/29/2017  . Onychomycosis 02/17/2018  . 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 Subcutaneous mass 04/07/2018   Resolved Ambulatory Problems    Diagnosis Date Noted  . Right lateral epicondylitis 07/21/2016  . Right wrist injury 12/29/2016  . Hand injury, left, subsequent encounter 12/30/2016  . Chills (without fever) 03/08/2017   Past Medical History:  Diagnosis Date  . Anxiety   . Bipolar 2  disorder, major depressive episode ([place])   . Complication of anesthesia   . COPD (chronic obstructive pulmonary disease) ([place])   . PONV (postoperative nausea and vomiting)   . Pulmonary embolism ([place]) 2000  . Sleep apnea   . Ulcerative colitis ([place])       Review of Systems  All other systems reviewed and are negative.      Objective:   Physical Exam  Constitutional: She is oriented to person, place, and time. She appears well-developed and well-nourished.  HENT:  Head: Normocephalic and atraumatic.  Cardiovascular: Normal rate and regular rhythm.  Pulmonary/Chest: Effort normal and breath sounds normal.  Musculoskeletal:  Right knee in brace.   Neurological: She is alert and oriented to person, place, and time.  Psychiatric: She has a normal mood and affect. Her behavior is normal.          Assessment & Plan:  Marland KitchenMarland KitchenDiagnoses and all orders for this visit:  Primary insomnia -     QUEtiapine (SEROQUEL) 100 MG tablet; Take one and one/half tablet before bed.  Need for Tdap vaccination -     Tdap vaccine greater than or equal to 7yo IM  Visit for screening mammogram -     MM 3D SCREEN BREAST BILATERAL  Screening for lipid disorders -     Lipid Panel w/reflex Direct LDL  Screening for diabetes mellitus -     COMPLETE METABOLIC PANEL WITH GFR  Acute pain of right knee   Refilled seroquel.   Will get some fasting labs.  Will alert Dr. Georgina Snell that MRI has been done and she is wondering the next step. Continue in brace to stability.

## 2018-08-09 DIAGNOSIS — S83511D Sprain of anterior cruciate ligament of right knee, subsequent encounter: Secondary | ICD-10-CM | POA: Diagnosis not present

## 2018-08-09 DIAGNOSIS — S8991XA Unspecified injury of right lower leg, initial encounter: Secondary | ICD-10-CM | POA: Diagnosis not present

## 2018-08-10 ENCOUNTER — Encounter: Payer: Self-pay | Admitting: Family Medicine

## 2018-08-10 NOTE — Telephone Encounter (Signed)
Note written and ready for pick up.

## 2018-08-10 NOTE — Telephone Encounter (Signed)
Left detailed message on patient vm advising that letter is ready for pickup. Shelli Portilla,CMA

## 2018-08-11 DIAGNOSIS — R5383 Other fatigue: Secondary | ICD-10-CM | POA: Diagnosis not present

## 2018-08-11 DIAGNOSIS — G35 Multiple sclerosis: Secondary | ICD-10-CM | POA: Diagnosis not present

## 2018-08-11 DIAGNOSIS — F319 Bipolar disorder, unspecified: Secondary | ICD-10-CM | POA: Diagnosis not present

## 2018-08-19 ENCOUNTER — Ambulatory Visit: Payer: Self-pay

## 2018-08-28 ENCOUNTER — Other Ambulatory Visit (HOSPITAL_COMMUNITY): Payer: Self-pay | Admitting: Psychiatry

## 2018-08-31 DIAGNOSIS — M2241 Chondromalacia patellae, right knee: Secondary | ICD-10-CM | POA: Diagnosis not present

## 2018-08-31 DIAGNOSIS — G35 Multiple sclerosis: Secondary | ICD-10-CM | POA: Diagnosis not present

## 2018-08-31 DIAGNOSIS — S8991XD Unspecified injury of right lower leg, subsequent encounter: Secondary | ICD-10-CM | POA: Diagnosis not present

## 2018-08-31 DIAGNOSIS — R2689 Other abnormalities of gait and mobility: Secondary | ICD-10-CM | POA: Diagnosis not present

## 2018-08-31 DIAGNOSIS — Z8673 Personal history of transient ischemic attack (TIA), and cerebral infarction without residual deficits: Secondary | ICD-10-CM | POA: Diagnosis not present

## 2018-08-31 DIAGNOSIS — M7651 Patellar tendinitis, right knee: Secondary | ICD-10-CM | POA: Diagnosis not present

## 2018-08-31 DIAGNOSIS — R29898 Other symptoms and signs involving the musculoskeletal system: Secondary | ICD-10-CM | POA: Diagnosis not present

## 2018-08-31 DIAGNOSIS — M797 Fibromyalgia: Secondary | ICD-10-CM | POA: Diagnosis not present

## 2018-08-31 DIAGNOSIS — M25461 Effusion, right knee: Secondary | ICD-10-CM | POA: Diagnosis not present

## 2018-09-07 DIAGNOSIS — G35 Multiple sclerosis: Secondary | ICD-10-CM | POA: Diagnosis not present

## 2018-09-13 DIAGNOSIS — M797 Fibromyalgia: Secondary | ICD-10-CM | POA: Diagnosis not present

## 2018-09-13 DIAGNOSIS — S8991XD Unspecified injury of right lower leg, subsequent encounter: Secondary | ICD-10-CM | POA: Diagnosis not present

## 2018-09-13 DIAGNOSIS — G35 Multiple sclerosis: Secondary | ICD-10-CM | POA: Diagnosis not present

## 2018-09-13 DIAGNOSIS — M722 Plantar fascial fibromatosis: Secondary | ICD-10-CM | POA: Diagnosis not present

## 2018-09-16 DIAGNOSIS — S83511D Sprain of anterior cruciate ligament of right knee, subsequent encounter: Secondary | ICD-10-CM | POA: Diagnosis not present

## 2018-09-20 DIAGNOSIS — S83511A Sprain of anterior cruciate ligament of right knee, initial encounter: Secondary | ICD-10-CM | POA: Diagnosis not present

## 2018-09-22 ENCOUNTER — Telehealth: Payer: Self-pay | Admitting: Family Medicine

## 2018-09-22 NOTE — Telephone Encounter (Signed)
Received note from Dr. [facility].  After failing trial of physical therapy for stability patient is now a good surgical candidate for ACL reconstruction with allograft hamstring.  Note will be sent to scan.

## 2018-09-28 ENCOUNTER — Other Ambulatory Visit: Payer: Self-pay | Admitting: Physician Assistant

## 2018-09-28 DIAGNOSIS — F5101 Primary insomnia: Secondary | ICD-10-CM

## 2018-09-29 ENCOUNTER — Other Ambulatory Visit (HOSPITAL_COMMUNITY): Payer: Self-pay | Admitting: Psychiatry

## 2018-10-04 DIAGNOSIS — K51819 Other ulcerative colitis with unspecified complications: Secondary | ICD-10-CM | POA: Diagnosis not present

## 2018-10-04 DIAGNOSIS — Z01812 Encounter for preprocedural laboratory examination: Secondary | ICD-10-CM | POA: Diagnosis not present

## 2018-10-05 ENCOUNTER — Encounter (HOSPITAL_COMMUNITY): Payer: Self-pay | Admitting: Psychiatry

## 2018-10-12 DIAGNOSIS — M94261 Chondromalacia, right knee: Secondary | ICD-10-CM | POA: Diagnosis not present

## 2018-10-12 DIAGNOSIS — S83511A Sprain of anterior cruciate ligament of right knee, initial encounter: Secondary | ICD-10-CM | POA: Diagnosis not present

## 2018-10-24 DIAGNOSIS — M79661 Pain in right lower leg: Secondary | ICD-10-CM | POA: Diagnosis not present

## 2018-10-24 DIAGNOSIS — R2241 Localized swelling, mass and lump, right lower limb: Secondary | ICD-10-CM | POA: Diagnosis not present

## 2018-10-24 DIAGNOSIS — G35 Multiple sclerosis: Secondary | ICD-10-CM | POA: Diagnosis not present

## 2018-10-24 DIAGNOSIS — Z86711 Personal history of pulmonary embolism: Secondary | ICD-10-CM | POA: Diagnosis not present

## 2018-10-24 DIAGNOSIS — Z9889 Other specified postprocedural states: Secondary | ICD-10-CM | POA: Diagnosis not present

## 2018-10-24 DIAGNOSIS — Z87891 Personal history of nicotine dependence: Secondary | ICD-10-CM | POA: Diagnosis not present

## 2018-10-24 DIAGNOSIS — S838X1D Sprain of other specified parts of right knee, subsequent encounter: Secondary | ICD-10-CM | POA: Diagnosis not present

## 2018-10-24 DIAGNOSIS — M797 Fibromyalgia: Secondary | ICD-10-CM | POA: Diagnosis not present

## 2018-10-24 DIAGNOSIS — M25561 Pain in right knee: Secondary | ICD-10-CM | POA: Diagnosis not present

## 2018-10-24 DIAGNOSIS — M722 Plantar fascial fibromatosis: Secondary | ICD-10-CM | POA: Diagnosis not present

## 2018-10-25 ENCOUNTER — Telehealth (HOSPITAL_COMMUNITY): Payer: Self-pay | Admitting: Psychiatry

## 2018-10-25 MED ORDER — LAMOTRIGINE 100 MG PO TABS: 100.0000 mg | ORAL_TABLET | Freq: Two times a day (BID) | ORAL | 0 refills | Status: DC

## 2018-10-25 NOTE — Telephone Encounter (Signed)
Pt needs refill on lamictal 100mg  2x a day. [facility] [address]

## 2018-10-27 ENCOUNTER — Other Ambulatory Visit: Payer: Self-pay

## 2018-10-27 MED ORDER — BALSALAZIDE DISODIUM 750 MG PO CAPS: 2250.0000 mg | ORAL_CAPSULE | Freq: Three times a day (TID) | ORAL | 0 refills | Status: DC

## 2018-10-27 NOTE — Telephone Encounter (Signed)
Paige Lozano called today saying she is in need of a refill on the medication Balsalazide (Colazal). Patient states she is traveling out of town, and she needs it refilled at a different pharmacy. I have updated her chart with that correct pharmacy and I will pend the medication for refill if it is appropriate. Thanks!

## 2018-10-31 ENCOUNTER — Telehealth: Payer: Self-pay | Admitting: Physician Assistant

## 2018-10-31 ENCOUNTER — Other Ambulatory Visit: Payer: Self-pay | Admitting: Physician Assistant

## 2018-10-31 ENCOUNTER — Ambulatory Visit (HOSPITAL_COMMUNITY): Payer: Medicare Other | Admitting: Psychiatry

## 2018-10-31 DIAGNOSIS — M1711 Unilateral primary osteoarthritis, right knee: Secondary | ICD-10-CM

## 2018-10-31 MED ORDER — HYDROCODONE-ACETAMINOPHEN 5-325 MG PO TABS: 1.0000 | ORAL_TABLET | Freq: Three times a day (TID) | ORAL | 0 refills | Status: DC | PRN

## 2018-10-31 NOTE — Telephone Encounter (Signed)
Patient's back is locked up. States that she has placed a heating pad on it in the meantime. She has been prescribed tiZANidine (ZANAFLEX) 4 MG tablet [[id number]] by Dr. Kriste Basque. States that she is out of town and then next appointment with that Doctor isn't until January 22nd. Her office is closed until January 6th, 2020. Would like to know if something can be prescribed to alleviate the pain. Patient will be out of town until December 30th. If anything can be sent, [facility] [address], [place], [place] [address]. Please contact and advise.

## 2018-10-31 NOTE — Telephone Encounter (Signed)
Pt advised.

## 2018-10-31 NOTE — Telephone Encounter (Signed)
I sent 15 tablets of norco sent to pharmacy.

## 2018-10-31 NOTE — Telephone Encounter (Signed)
Let patient know [facility]. She said thank you so much and no further questions/concerns at this time.

## 2018-11-03 ENCOUNTER — Other Ambulatory Visit (HOSPITAL_COMMUNITY): Payer: Self-pay | Admitting: Psychiatry

## 2018-11-07 DIAGNOSIS — G35 Multiple sclerosis: Secondary | ICD-10-CM | POA: Diagnosis not present

## 2018-11-07 DIAGNOSIS — M25561 Pain in right knee: Secondary | ICD-10-CM | POA: Diagnosis not present

## 2018-11-07 DIAGNOSIS — M722 Plantar fascial fibromatosis: Secondary | ICD-10-CM | POA: Diagnosis not present

## 2018-11-07 DIAGNOSIS — S838X1D Sprain of other specified parts of right knee, subsequent encounter: Secondary | ICD-10-CM | POA: Diagnosis not present

## 2018-11-07 DIAGNOSIS — M797 Fibromyalgia: Secondary | ICD-10-CM | POA: Diagnosis not present

## 2018-11-08 DIAGNOSIS — M791 Myalgia, unspecified site: Secondary | ICD-10-CM | POA: Diagnosis not present

## 2018-11-08 DIAGNOSIS — G894 Chronic pain syndrome: Secondary | ICD-10-CM | POA: Diagnosis not present

## 2018-11-10 DIAGNOSIS — R29898 Other symptoms and signs involving the musculoskeletal system: Secondary | ICD-10-CM | POA: Diagnosis not present

## 2018-11-10 DIAGNOSIS — S83511D Sprain of anterior cruciate ligament of right knee, subsequent encounter: Secondary | ICD-10-CM | POA: Diagnosis not present

## 2018-11-10 DIAGNOSIS — Z9889 Other specified postprocedural states: Secondary | ICD-10-CM | POA: Diagnosis not present

## 2018-11-10 DIAGNOSIS — M25561 Pain in right knee: Secondary | ICD-10-CM | POA: Diagnosis not present

## 2018-11-10 DIAGNOSIS — R262 Difficulty in walking, not elsewhere classified: Secondary | ICD-10-CM | POA: Diagnosis not present

## 2018-11-10 DIAGNOSIS — G35 Multiple sclerosis: Secondary | ICD-10-CM | POA: Diagnosis not present

## 2018-11-10 DIAGNOSIS — R600 Localized edema: Secondary | ICD-10-CM | POA: Diagnosis not present

## 2018-11-14 DIAGNOSIS — R29898 Other symptoms and signs involving the musculoskeletal system: Secondary | ICD-10-CM | POA: Diagnosis not present

## 2018-11-14 DIAGNOSIS — G35 Multiple sclerosis: Secondary | ICD-10-CM | POA: Diagnosis not present

## 2018-11-14 DIAGNOSIS — R262 Difficulty in walking, not elsewhere classified: Secondary | ICD-10-CM | POA: Diagnosis not present

## 2018-11-14 DIAGNOSIS — M25561 Pain in right knee: Secondary | ICD-10-CM | POA: Diagnosis not present

## 2018-11-14 DIAGNOSIS — S83511D Sprain of anterior cruciate ligament of right knee, subsequent encounter: Secondary | ICD-10-CM | POA: Diagnosis not present

## 2018-11-14 DIAGNOSIS — Z9889 Other specified postprocedural states: Secondary | ICD-10-CM | POA: Diagnosis not present

## 2018-11-15 ENCOUNTER — Ambulatory Visit (HOSPITAL_COMMUNITY): Payer: Medicare Other | Admitting: Psychiatry

## 2018-11-16 DIAGNOSIS — Z79899 Other long term (current) drug therapy: Secondary | ICD-10-CM | POA: Diagnosis not present

## 2018-11-16 DIAGNOSIS — H25049 Posterior subcapsular polar age-related cataract, unspecified eye: Secondary | ICD-10-CM | POA: Diagnosis not present

## 2018-11-17 DIAGNOSIS — M25561 Pain in right knee: Secondary | ICD-10-CM | POA: Diagnosis not present

## 2018-11-17 DIAGNOSIS — R262 Difficulty in walking, not elsewhere classified: Secondary | ICD-10-CM | POA: Diagnosis not present

## 2018-11-17 DIAGNOSIS — Z9889 Other specified postprocedural states: Secondary | ICD-10-CM | POA: Diagnosis not present

## 2018-11-17 DIAGNOSIS — R29898 Other symptoms and signs involving the musculoskeletal system: Secondary | ICD-10-CM | POA: Diagnosis not present

## 2018-11-17 DIAGNOSIS — G35 Multiple sclerosis: Secondary | ICD-10-CM | POA: Diagnosis not present

## 2018-11-17 DIAGNOSIS — S83511D Sprain of anterior cruciate ligament of right knee, subsequent encounter: Secondary | ICD-10-CM | POA: Diagnosis not present

## 2018-11-18 ENCOUNTER — Ambulatory Visit (HOSPITAL_COMMUNITY): Payer: Medicare Other | Admitting: Psychiatry

## 2018-11-20 ENCOUNTER — Other Ambulatory Visit: Payer: Self-pay | Admitting: Physician Assistant

## 2018-11-21 DIAGNOSIS — R262 Difficulty in walking, not elsewhere classified: Secondary | ICD-10-CM | POA: Diagnosis not present

## 2018-11-21 DIAGNOSIS — Z9889 Other specified postprocedural states: Secondary | ICD-10-CM | POA: Diagnosis not present

## 2018-11-21 DIAGNOSIS — G35 Multiple sclerosis: Secondary | ICD-10-CM | POA: Diagnosis not present

## 2018-11-21 DIAGNOSIS — S83511D Sprain of anterior cruciate ligament of right knee, subsequent encounter: Secondary | ICD-10-CM | POA: Diagnosis not present

## 2018-11-21 DIAGNOSIS — R29898 Other symptoms and signs involving the musculoskeletal system: Secondary | ICD-10-CM | POA: Diagnosis not present

## 2018-11-21 DIAGNOSIS — M25561 Pain in right knee: Secondary | ICD-10-CM | POA: Diagnosis not present

## 2018-11-23 DIAGNOSIS — F319 Bipolar disorder, unspecified: Secondary | ICD-10-CM | POA: Diagnosis not present

## 2018-11-23 DIAGNOSIS — R5383 Other fatigue: Secondary | ICD-10-CM | POA: Diagnosis not present

## 2018-11-23 DIAGNOSIS — G35 Multiple sclerosis: Secondary | ICD-10-CM | POA: Diagnosis not present

## 2018-11-24 DIAGNOSIS — M25561 Pain in right knee: Secondary | ICD-10-CM | POA: Diagnosis not present

## 2018-11-24 DIAGNOSIS — G35 Multiple sclerosis: Secondary | ICD-10-CM | POA: Diagnosis not present

## 2018-11-24 DIAGNOSIS — Z79899 Other long term (current) drug therapy: Secondary | ICD-10-CM | POA: Diagnosis not present

## 2018-11-24 DIAGNOSIS — S83511D Sprain of anterior cruciate ligament of right knee, subsequent encounter: Secondary | ICD-10-CM | POA: Diagnosis not present

## 2018-11-24 DIAGNOSIS — R262 Difficulty in walking, not elsewhere classified: Secondary | ICD-10-CM | POA: Diagnosis not present

## 2018-11-24 DIAGNOSIS — Z9889 Other specified postprocedural states: Secondary | ICD-10-CM | POA: Diagnosis not present

## 2018-11-24 DIAGNOSIS — R29898 Other symptoms and signs involving the musculoskeletal system: Secondary | ICD-10-CM | POA: Diagnosis not present

## 2018-11-28 ENCOUNTER — Telehealth: Payer: Self-pay

## 2018-11-28 MED ORDER — OSELTAMIVIR PHOSPHATE 75 MG PO CAPS: 75.0000 mg | ORAL_CAPSULE | Freq: Every day | ORAL | 0 refills | Status: DC

## 2018-11-28 NOTE — Telephone Encounter (Signed)
Paige Lozano is one of Paige Lozano's patients. She called today requesting tamiflu as she has recently been taking care of two family members with the flu. Patient states she has a very weakened immune system and is afraid of getting sick.   Please advise. Thank you!

## 2018-11-28 NOTE — Telephone Encounter (Signed)
Prescription resent to correct pharmacy. [facility]. Patient has also been notified. No further questions or concerns at this time. Thanks!

## 2018-11-28 NOTE — Addendum Note (Signed)
Addended by: Beatrice Lecher D on: 11/28/2018 02:01 PM   Modules accepted: Orders

## 2018-11-28 NOTE — Addendum Note (Signed)
Addended by: Darene Lamer on: 11/28/2018 02:33 PM   Modules accepted: Orders

## 2018-11-28 NOTE — Telephone Encounter (Signed)
rx sent to pharmacy

## 2018-11-29 DIAGNOSIS — M47816 Spondylosis without myelopathy or radiculopathy, lumbar region: Secondary | ICD-10-CM | POA: Diagnosis not present

## 2018-12-01 ENCOUNTER — Encounter: Payer: Self-pay | Admitting: Physician Assistant

## 2018-12-01 ENCOUNTER — Ambulatory Visit (INDEPENDENT_AMBULATORY_CARE_PROVIDER_SITE_OTHER): Payer: Medicare Other | Admitting: Physician Assistant

## 2018-12-01 VITALS — BP 138/92 | HR 57 | Ht 68.5 in | Wt 195.0 lb

## 2018-12-01 DIAGNOSIS — R05 Cough: Secondary | ICD-10-CM

## 2018-12-01 DIAGNOSIS — J0101 Acute recurrent maxillary sinusitis: Secondary | ICD-10-CM

## 2018-12-01 DIAGNOSIS — Z23 Encounter for immunization: Secondary | ICD-10-CM | POA: Diagnosis not present

## 2018-12-01 DIAGNOSIS — Z803 Family history of malignant neoplasm of breast: Secondary | ICD-10-CM

## 2018-12-01 DIAGNOSIS — Z131 Encounter for screening for diabetes mellitus: Secondary | ICD-10-CM | POA: Diagnosis not present

## 2018-12-01 DIAGNOSIS — F5101 Primary insomnia: Secondary | ICD-10-CM | POA: Diagnosis not present

## 2018-12-01 DIAGNOSIS — R059 Cough, unspecified: Secondary | ICD-10-CM

## 2018-12-01 LAB — POCT INFLUENZA A/B
Influenza A, POC: NEGATIVE
Influenza B, POC: NEGATIVE

## 2018-12-01 MED ORDER — AZITHROMYCIN 250 MG PO TABS: ORAL_TABLET | ORAL | 0 refills | Status: DC

## 2018-12-01 MED ORDER — BENZONATATE 200 MG PO CAPS: 200.0000 mg | ORAL_CAPSULE | Freq: Two times a day (BID) | ORAL | 0 refills | Status: DC | PRN

## 2018-12-01 MED ORDER — QUETIAPINE FUMARATE 200 MG PO TABS: 200.0000 mg | ORAL_TABLET | Freq: Every day | ORAL | 5 refills | Status: DC

## 2018-12-01 NOTE — Progress Notes (Signed)
Subjective:    Patient ID: Paige Lozano, female    DOB: [birth year], 53 y.o.   MRN: [id number]  HPI  Pt is a 53 yo female who presents to the clinic with cough for the last week and some worsening fatigue and start of body aches for the last day. She has had continued sinus pressure and headache off and on for last 2 weeks. At night her teeth ache. Taking mucinex and flonase with some relief. No known fever. Hx of sinus surgery.   Her sister is in the hospital after dx of triple negative breast cancer. She does not want to get her sister sick. She would like to confirm she does not have the flu. Her niece had the flu and she was around her. She called for preventative but too expensive to pick up.   She increased her seroquel and working great for sleep.   .. Active Ambulatory Problems    Diagnosis Date Noted  . Multiple sclerosis ([place]) 05/29/2016  . Fibromyalgia 05/29/2016  . 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 Insomnia 05/29/2016  . Ulcerative pancolitis without complication ([place]) 02/40/9735  . Bipolar 2 disorder ([place]) 05/29/2016  . Current smoker 05/29/2016  . Primary osteoarthritis of right knee with recent ACL tear and femoral condylar impaction fracture 06/01/2016  . Anxiety state 06/01/2016  . Left shoulder pain 06/01/2016  . GAD (generalized anxiety disorder) 07/20/2016  . History of pulmonary embolus ([place]) 07/21/2016  . Right elbow pain 07/21/2016  . Sinusitis 11/30/2016  . Hot flashes 12/29/2016  . Body aches 12/29/2016  . Pruritus 12/29/2016  . Palpitations 03/08/2017  . Chest tightness 03/08/2017  . Tachycardia 03/08/2017  . Sinus tachycardia 03/12/2017  . PAC (premature atrial contraction) 03/12/2017  . Lumbar degenerative disc disease 06/02/2017  . Plantar fasciitis, bilateral 10/29/2017  . Onychomycosis 02/17/2018  . 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 Subcutaneous mass 04/07/2018  . Family history of breast cancer 12/02/2018   Resolved Ambulatory Problems   Diagnosis Date Noted  . Right lateral epicondylitis 07/21/2016  . Right wrist injury 12/29/2016  . Hand injury, left, subsequent encounter 12/30/2016  . Chills (without fever) 03/08/2017   Past Medical History:  Diagnosis Date  . Anxiety   . Bipolar 2 disorder, major depressive episode ([place])   . Complication of anesthesia   . COPD (chronic obstructive pulmonary disease) ([place])   . PONV (postoperative nausea and vomiting)   . Pulmonary embolism ([place]) 2000  . Sleep apnea   . Ulcerative colitis ([place])         Review of Systems  All other systems reviewed and are negative.      Objective:   Physical Exam Vitals signs reviewed.  Constitutional:      Appearance: Normal appearance.  HENT:     Head: Normocephalic and atraumatic.     Comments: Tenderness over maxillary sinuses to palpation.     Right Ear: Tympanic membrane, ear canal and external ear normal.     Left Ear: Tympanic membrane, ear canal and external ear normal.     Nose: Nose normal. No congestion.     Mouth/Throat:     Mouth: Mucous membranes are moist.     Pharynx: No oropharyngeal exudate.  Eyes:     Pupils: Pupils are equal, round, and reactive to light.  Cardiovascular:     Rate and Rhythm: Normal rate and regular rhythm.  Pulmonary:     Effort: Pulmonary effort is normal.     Breath sounds: Normal breath sounds.  No wheezing or rhonchi.  Neurological:     General: No focal deficit present.     Mental Status: She is alert and oriented to person, place, and time.  Psychiatric:        Mood and Affect: Mood normal.        Behavior: Behavior normal.           Assessment & Plan:  Marland KitchenMarland KitchenJaskirat was seen today for cough.  Diagnoses and all orders for this visit:  Cough -     benzonatate (TESSALON) 200 MG capsule; Take 1 capsule (200 mg total) by mouth 2 (two) times daily as needed for cough. -     POCT Influenza A/B  Family history of breast cancer  Primary insomnia -     QUEtiapine (SEROQUEL) 200 MG  tablet; Take 1 tablet (200 mg total) by mouth at bedtime.  Acute recurrent maxillary sinusitis -     azithromycin (ZITHROMAX) 250 MG tablet; Take 2 tablets now and then one tablet for 4 days.   .. Results for orders placed or performed in visit on 12/01/18  POCT Influenza A/B  Result Value Ref Range   Influenza A, POC Negative Negative   Influenza B, POC Negative Negative    Reassured flu is negative. Treated for ongoing sinus infection which I believe is causing cough.  Zpak, tessalon given today.  Discussed adding flonase.  Follow up as needed.   STRONGLY encouraged to get a mammogram due to family hx.   Flu shot given today.

## 2018-12-01 NOTE — Patient Instructions (Signed)

## 2018-12-02 ENCOUNTER — Encounter: Payer: Self-pay | Admitting: Physician Assistant

## 2018-12-02 DIAGNOSIS — Z803 Family history of malignant neoplasm of breast: Secondary | ICD-10-CM | POA: Insufficient documentation

## 2018-12-02 LAB — COMPLETE METABOLIC PANEL WITH GFR
AG RATIO: 2.1 (calc) (ref 1.0–2.5)
ALBUMIN MSPROF: 4.6 g/dL (ref 3.6–5.1)
ALT: 10 U/L (ref 6–29)
AST: 16 U/L (ref 10–35)
Alkaline phosphatase (APISO): 103 U/L (ref 33–130)
BUN: 13 mg/dL (ref 7–25)
CALCIUM: 9.4 mg/dL (ref 8.6–10.4)
CHLORIDE: 103 mmol/L (ref 98–110)
CO2: 26 mmol/L (ref 20–32)
Creat: 0.84 mg/dL (ref 0.50–1.05)
GFR, EST AFRICAN AMERICAN: 93 mL/min/{1.73_m2} (ref >=60)
GFR, Est Non African American: 80 mL/min/{1.73_m2} (ref >=60)
GLOBULIN: 2.2 g/dL (ref 1.9–3.7)
Glucose, Bld: 122 mg/dL — ABNORMAL HIGH (ref 65–99)
Potassium: 3.9 mmol/L (ref 3.5–5.3)
SODIUM: 142 mmol/L (ref 135–146)
TOTAL PROTEIN: 6.8 g/dL (ref 6.1–8.1)
Total Bilirubin: 0.4 mg/dL (ref 0.2–1.2)

## 2018-12-02 NOTE — Progress Notes (Signed)
Lipid pending. Did you have drawn.   Glucose up some but at the end of the day were you fasting?   Kidney, liver look great.

## 2018-12-05 ENCOUNTER — Telehealth: Payer: Self-pay | Admitting: Physician Assistant

## 2018-12-05 ENCOUNTER — Encounter: Payer: Self-pay | Admitting: Physician Assistant

## 2018-12-05 DIAGNOSIS — G35 Multiple sclerosis: Secondary | ICD-10-CM | POA: Diagnosis not present

## 2018-12-05 DIAGNOSIS — Z79899 Other long term (current) drug therapy: Secondary | ICD-10-CM | POA: Diagnosis not present

## 2018-12-05 DIAGNOSIS — R262 Difficulty in walking, not elsewhere classified: Secondary | ICD-10-CM | POA: Diagnosis not present

## 2018-12-05 DIAGNOSIS — M25561 Pain in right knee: Secondary | ICD-10-CM | POA: Diagnosis not present

## 2018-12-05 DIAGNOSIS — Z9889 Other specified postprocedural states: Secondary | ICD-10-CM | POA: Diagnosis not present

## 2018-12-05 DIAGNOSIS — R29898 Other symptoms and signs involving the musculoskeletal system: Secondary | ICD-10-CM | POA: Diagnosis not present

## 2018-12-05 DIAGNOSIS — S83511D Sprain of anterior cruciate ligament of right knee, subsequent encounter: Secondary | ICD-10-CM | POA: Diagnosis not present

## 2018-12-05 NOTE — Telephone Encounter (Signed)
Please addend note and put in flu shot given.

## 2018-12-06 DIAGNOSIS — Z23 Encounter for immunization: Secondary | ICD-10-CM | POA: Diagnosis not present

## 2018-12-06 NOTE — Telephone Encounter (Signed)
Done

## 2018-12-06 NOTE — Addendum Note (Signed)
Addended by: Darene Lamer on: 12/06/2018 08:30 AM   Modules accepted: Orders

## 2018-12-08 ENCOUNTER — Ambulatory Visit (INDEPENDENT_AMBULATORY_CARE_PROVIDER_SITE_OTHER): Payer: Medicare Other

## 2018-12-08 DIAGNOSIS — Z1231 Encounter for screening mammogram for malignant neoplasm of breast: Secondary | ICD-10-CM

## 2018-12-08 DIAGNOSIS — S83511D Sprain of anterior cruciate ligament of right knee, subsequent encounter: Secondary | ICD-10-CM | POA: Diagnosis not present

## 2018-12-08 DIAGNOSIS — M25561 Pain in right knee: Secondary | ICD-10-CM | POA: Diagnosis not present

## 2018-12-08 DIAGNOSIS — Z9889 Other specified postprocedural states: Secondary | ICD-10-CM | POA: Diagnosis not present

## 2018-12-08 DIAGNOSIS — G35 Multiple sclerosis: Secondary | ICD-10-CM | POA: Diagnosis not present

## 2018-12-08 DIAGNOSIS — R262 Difficulty in walking, not elsewhere classified: Secondary | ICD-10-CM | POA: Diagnosis not present

## 2018-12-08 DIAGNOSIS — R29898 Other symptoms and signs involving the musculoskeletal system: Secondary | ICD-10-CM | POA: Diagnosis not present

## 2018-12-09 ENCOUNTER — Other Ambulatory Visit (HOSPITAL_COMMUNITY): Payer: Self-pay | Admitting: Psychiatry

## 2018-12-09 NOTE — Progress Notes (Signed)
Call pt: normal mammogram. Follow up in 1 year.

## 2018-12-12 DIAGNOSIS — M25561 Pain in right knee: Secondary | ICD-10-CM | POA: Diagnosis not present

## 2018-12-21 DIAGNOSIS — Z4789 Encounter for other orthopedic aftercare: Secondary | ICD-10-CM | POA: Diagnosis not present

## 2018-12-21 DIAGNOSIS — S83511D Sprain of anterior cruciate ligament of right knee, subsequent encounter: Secondary | ICD-10-CM | POA: Diagnosis not present

## 2018-12-21 DIAGNOSIS — M797 Fibromyalgia: Secondary | ICD-10-CM | POA: Diagnosis not present

## 2018-12-21 DIAGNOSIS — M25661 Stiffness of right knee, not elsewhere classified: Secondary | ICD-10-CM | POA: Diagnosis not present

## 2018-12-21 DIAGNOSIS — G35 Multiple sclerosis: Secondary | ICD-10-CM | POA: Diagnosis not present

## 2018-12-21 DIAGNOSIS — M25561 Pain in right knee: Secondary | ICD-10-CM | POA: Diagnosis not present

## 2018-12-22 ENCOUNTER — Other Ambulatory Visit: Payer: Self-pay

## 2018-12-22 ENCOUNTER — Ambulatory Visit (INDEPENDENT_AMBULATORY_CARE_PROVIDER_SITE_OTHER): Payer: Medicare Other | Admitting: Psychiatry

## 2018-12-22 ENCOUNTER — Encounter (HOSPITAL_COMMUNITY): Payer: Self-pay | Admitting: Psychiatry

## 2018-12-22 ENCOUNTER — Ambulatory Visit (HOSPITAL_COMMUNITY): Payer: Medicare Other | Admitting: Psychiatry

## 2018-12-22 VITALS — BP 146/94 | HR 112 | Ht 68.5 in | Wt 192.0 lb

## 2018-12-22 DIAGNOSIS — F4310 Post-traumatic stress disorder, unspecified: Secondary | ICD-10-CM | POA: Diagnosis not present

## 2018-12-22 DIAGNOSIS — F3181 Bipolar II disorder: Secondary | ICD-10-CM

## 2018-12-22 DIAGNOSIS — F5102 Adjustment insomnia: Secondary | ICD-10-CM

## 2018-12-22 DIAGNOSIS — F411 Generalized anxiety disorder: Secondary | ICD-10-CM

## 2018-12-22 MED ORDER — LAMOTRIGINE 100 MG PO TABS: 100.0000 mg | ORAL_TABLET | Freq: Two times a day (BID) | ORAL | 3 refills | Status: DC

## 2018-12-22 NOTE — Progress Notes (Signed)
[facility] Outpatient Follow up visit   Patient Identification: Paige Lozano MRN:  [id number] Date of Evaluation:  12/22/2018 Referral Source: [facility] Primary care Chief Complaint:   Chief Complaint    Follow-up; Other     Visit Diagnosis:    ICD-10-CM   1. Bipolar 2 disorder (HCC) F31.81   2. PTSD (post-traumatic stress disorder) F43.10   3. GAD (generalized anxiety disorder) F41.1   4. Adjustment insomnia F51.02     History of Present Illness:  53  years old Returns for follow-up and medication management  She is still going through [place] eval/ treatment   Last visit we increased the Lamictal has been doing fair but apparently more stressed because of relationship also working part-time having had knee surgery all these things have added up financially as well  She believes she needs to cut down on her work hours there is no chest pain but she had to run because she was running late and her pulse was high she says that it does happen and she has went to the gym as well Denies palpitations  Seen neurology for MS and fatigue.    No rash Primary care has started Seroquel.  Says it was for insomnia There is also a mood stabilizer as she has stopped olanzapine the past so she should be aware the Seroquel because of medication and its effect  Aggravating factor: surgeries . Multiple medical .surgeries Modifying factor: dogs  Duration more then 10 years     Previous Psychotropic Medications: Yes   Substance Abuse History in the last 12 months:  Yes.   as per history Marijuana says it is medical for her condition.  Consequences of Substance Abuse: Medical Consequences:  fatigue, poor concenctration  Past Medical History:  Past Medical History:  Diagnosis Date  . Anxiety   . Bipolar 2 disorder, major depressive episode ([place])   . Complication of anesthesia   . COPD (chronic obstructive pulmonary disease) ([place])    smoker  . Fibromyalgia   . Insomnia   . Multiple sclerosis ([place])    . PONV (postoperative nausea and vomiting)   . Pulmonary embolism ([place]) 2000  . Sleep apnea    mild OSA no CPAP  . Ulcerative colitis [facility])     Past Surgical History:  Procedure Laterality Date  . FOOT SURGERY Left    bone spur  . HEMORRHOID SURGERY    . HUMERUS FRACTURE SURGERY Left   . MYOMECTOMY    . NASAL SINUS SURGERY    . SHOULDER ARTHROSCOPY WITH SUBACROMIAL DECOMPRESSION AND BICEP TENDON REPAIR Left 07/27/2016   Procedure: SHOULDER ARTHROSCOPY DEBRIDEMENT ROTATOR CUFF AND LABRUM, SUBACROMIAL DECOMPRESSION, BICEPS TENODESIS;  Surgeon: Tania Ade, MD;  Location: [place];  Service: Orthopedics;  Laterality: Left;  SHOULDER ARTHROSCOPY DEBRIDEMENT ROTATOR CUFF AND LABRUM, SUBACROMIAL DECOMPRESSION, BICEPS TENOTOMY, POSSIBLE TENODESIS  . THUMB ARTHROSCOPY     5 surgeries on left thumb, 4 surgeries on right  . TONSILLECTOMY AND ADENOIDECTOMY       Family History:  Family History  Adopted: Yes  Problem Relation Age of Onset  . Breast cancer Sister        Triple Negative    Social History:   Social History   Socioeconomic History  . Marital status: Divorced    Spouse name: Not on file  . Number of children: Not on file  . Years of education: Not on file  . Highest education level: Not on file  Occupational History  .  Not on file  Social Needs  . Financial resource strain: Not on file  . Food insecurity:    Worry: Not on file    Inability: Not on file  . Transportation needs:    Medical: Not on file    Non-medical: Not on file  Tobacco Use  . Smoking status: Former Smoker    Packs/day: 0.25    Types: Cigarettes    Last attempt to quit: 07/08/2016    Years since quitting: 2.4  . Smokeless tobacco: Never Used  Substance and Sexual Activity  . Alcohol use: Yes    Alcohol/week: 0.0 standard drinks    Comment: rare   . Drug use: No    Types: Marijuana    Comment: last use 5 month ago  . Sexual activity: Yes    Partners: Male    Birth  control/protection: None  Lifestyle  . Physical activity:    Days per week: Not on file    Minutes per session: Not on file  . Stress: Not on file  Relationships  . Social connections:    Talks on phone: Not on file    Gets together: Not on file    Attends religious service: Not on file    Active member of club or organization: Not on file    Attends meetings of clubs or organizations: Not on file    Relationship status: Not on file  Other Topics Concern  . Not on file  Social History Narrative  . Not on file     Allergies:   Allergies  Allergen Reactions  . Ketamine Anaphylaxis  . Oxycodone Diarrhea  . Papaya Enzyme Anaphylaxis    Metabolic Disorder Labs: No results found for: HGBA1C, MPG No results found for: PROLACTIN No results found for: CHOL, TRIG, HDL, CHOLHDL, VLDL, LDLCALC   Current Medications: Current Outpatient Medications  Medication Sig Dispense Refill  . acetaminophen (TYLENOL) 650 MG CR tablet Take 1 tablet (650 mg total) by mouth every 8 (eight) hours as needed for pain. 90 tablet 3  . albuterol (PROVENTIL HFA;VENTOLIN HFA) 108 (90 Base) MCG/ACT inhaler Inhale 2 puffs into the lungs every 6 (six) hours as needed for wheezing or shortness of breath. 1 Inhaler 0  . balsalazide (COLAZAL) 750 MG capsule TAKE 3 CAPSULES (2,250 MG TOTAL) BY MOUTH 3 (THREE) TIMES DAILY. 270 capsule 0  . Cholecalciferol (VITAMIN D-1000 MAX ST) 1000 units tablet Take by mouth.    . clonazePAM (KLONOPIN) 0.5 MG tablet Take 0.5 mg by mouth 2 (two) times daily. prn    . cyclobenzaprine (FLEXERIL) 10 MG tablet TAKE 1/2 (ONE-HALF) TABLET BY MOUTH AT BEDTIME, THEN  INCREASE  GRADUALLY  TO  1  TABLET  3  TIMES  DAILY 30 tablet 0  . fexofenadine (ALLEGRA ALLERGY) 180 MG tablet Take 1 tablet (180 mg total) by mouth daily. 90 tablet 4  . fluticasone (FLONASE) 50 MCG/ACT nasal spray Place 2 sprays into both nostrils daily. 16 g 3  . folic acid (FOLVITE) 1 MG tablet Take 1 mg by mouth daily.     . hydrOXYzine (VISTARIL) 25 MG capsule Take 1 capsule (25 mg total) by mouth 3 (three) times daily as needed. 90 capsule 1  . lamoTRIgine (LAMICTAL) 100 MG tablet Take 1 tablet (100 mg total) by mouth 2 (two) times daily. 60 tablet 3  . Multiple Vitamin (MULTI-VITAMINS) TABS Take by mouth.    . Natalizumab (TYSABRI IV) Inject into the vein.    Marland Kitchen oseltamivir (  TAMIFLU) 75 MG capsule Take 1 capsule (75 mg total) by mouth daily. X 10 days 10 capsule 0  . QUEtiapine (SEROQUEL) 200 MG tablet Take 1 tablet (200 mg total) by mouth at bedtime. 30 tablet 5  . tiZANidine (ZANAFLEX) 4 MG tablet Take 4 mg by mouth 2 (two) times daily as needed.  2  . triamcinolone cream (KENALOG) 0.5 % Apply 1 application topically 2 (two) times daily. To affected areas. 30 g 3  . azithromycin (ZITHROMAX) 250 MG tablet Take 2 tablets now and then one tablet for 4 days. (Patient not taking: Reported on 12/22/2018) 6 tablet 0  . benzonatate (TESSALON) 200 MG capsule Take 1 capsule (200 mg total) by mouth 2 (two) times daily as needed for cough. (Patient not taking: Reported on 12/22/2018) 20 capsule 0  . HYDROcodone-acetaminophen (NORCO/VICODIN) 5-325 MG tablet Take 1 tablet by mouth every 8 (eight) hours as needed for moderate pain. (Patient not taking: Reported on 12/22/2018) 15 tablet 0   No current facility-administered medications for this visit.       Psychiatric Specialty Exam: Review of Systems  Cardiovascular: Negative for chest pain and palpitations.  Musculoskeletal: Positive for myalgias.  Skin: Negative for rash.  Neurological: Negative for tremors and headaches.  Psychiatric/Behavioral: Negative for suicidal ideas.    Blood pressure (!) 146/94, pulse (!) 112, height 5' 8.5" (1.74 m), weight 192 lb (87.1 kg).Body mass index is 28.77 kg/m.  General Appearance: Casual  Eye Contact:  Fair  Speech:  Normal Rate  Volume:  normal  Mood: fair, somewhat stressed  Affect:  congruent  Thought Process:  Goal  Directed  Orientation:  Full (Time, Place, and Person)  Thought Content:  Logical  Suicidal Thoughts:  No  Homicidal Thoughts:  No  Memory:  Immediate;   Fair Recent;   Fair  Judgement:  Fair  Insight:  Fair  Psychomotor Activity:  Normal  Concentration:  Concentration: Fair and Attention Span: Fair  Recall:  [employer] of Knowledge:Fair  Language: Fair  Akathisia:  Negative  Handed:  Right  AIMS (if indicated):    Assets:  Desire for Improvement  ADL's:  Intact  Cognition: WNL  Sleep:  Fair while on meds    Treatment Plan Summary: Medication management and Plan as follows  Bipolar 2: mind races at night at times. Says seroquel helps sleep, continue lamictal, balance work hours so not get overly stressed GAD: stressed, on prn klonopine Consider therapy   Insomnia: fluctuates. Reviewed sleep hygiene. Getting seroquel from primary care  avoid phenteramine or stimulants FU 79m or earlier if needed   Merian Capron, MD 2/13/20204:21 PM

## 2018-12-28 ENCOUNTER — Other Ambulatory Visit: Payer: Self-pay

## 2018-12-28 DIAGNOSIS — M25661 Stiffness of right knee, not elsewhere classified: Secondary | ICD-10-CM | POA: Diagnosis not present

## 2018-12-28 DIAGNOSIS — M25561 Pain in right knee: Secondary | ICD-10-CM | POA: Diagnosis not present

## 2018-12-28 DIAGNOSIS — S83511D Sprain of anterior cruciate ligament of right knee, subsequent encounter: Secondary | ICD-10-CM | POA: Diagnosis not present

## 2018-12-28 DIAGNOSIS — G35 Multiple sclerosis: Secondary | ICD-10-CM | POA: Diagnosis not present

## 2018-12-28 DIAGNOSIS — M797 Fibromyalgia: Secondary | ICD-10-CM | POA: Diagnosis not present

## 2018-12-28 DIAGNOSIS — Z4789 Encounter for other orthopedic aftercare: Secondary | ICD-10-CM | POA: Diagnosis not present

## 2018-12-28 MED ORDER — CLONAZEPAM 0.5 MG PO TABS: 0.5000 mg | ORAL_TABLET | Freq: Two times a day (BID) | ORAL | 0 refills | Status: DC

## 2018-12-28 NOTE — Telephone Encounter (Signed)
Patient called today requesting a refill on her klonopin. Patient states she had to switch pharmacies due to her insurance changing. I have changed patient's pharmacy to correct pharmacy, and have pended the medication for refill if appropriate. No further questions or concerns at this time. Thanks!

## 2018-12-29 ENCOUNTER — Ambulatory Visit (INDEPENDENT_AMBULATORY_CARE_PROVIDER_SITE_OTHER): Payer: Medicare Other | Admitting: Family Medicine

## 2018-12-29 ENCOUNTER — Encounter: Payer: Self-pay | Admitting: Family Medicine

## 2018-12-29 VITALS — BP 127/90 | HR 81 | Ht 68.5 in | Wt 191.0 lb

## 2018-12-29 DIAGNOSIS — B309 Viral conjunctivitis, unspecified: Secondary | ICD-10-CM | POA: Diagnosis not present

## 2018-12-29 MED ORDER — POLYMYXIN B-TRIMETHOPRIM 10000-0.1 UNIT/ML-% OP SOLN: 2.0000 [drp] | OPHTHALMIC | 0 refills | Status: DC

## 2018-12-29 NOTE — Patient Instructions (Addendum)
Thank you for coming in today. I think this is conjunctivitis likely viral or allergic.  Continue systane eye drops as needed.   Use over-the-counter Zaditor eyedrops (Ketotifen) twice daily as needed for itching.   Fill and take the antibiotic eye drop if worse.   Use warm compress as needed.   This should resolve soonish.   Ok to be around you sister ina few when feeling better.   Keep track of blood pressure.    Viral Conjunctivitis, Adult  Viral conjunctivitis is an inflammation of the clear membrane that covers the white part of your eye and the inner surface of your eyelid (conjunctiva). The inflammation is caused by a viral infection. The blood vessels in the conjunctiva become inflamed, causing the eye to become red or pink, and often itchy. Viral conjunctivitis can be easily passed from one person to another (is contagious). This condition is often called pink eye. What are the causes? This condition is caused by a virus. A virus is a type of contagious germ. It can be spread by touching objects that have been contaminated with the virus, such as doorknobs or towels. It can also be passed through droplets, such as from coughing or sneezing. What are the signs or symptoms? Symptoms of this condition include:  Eye redness.  Tearing or watery eyes.  Itchy and irritated eyes.  Burning feeling in the eyes.  Clear drainage from the eye.  Swollen eyelids.  A gritty feeling in the eye.  Light sensitivity. This condition often occurs with other symptoms, such as a fever, nausea, or a rash. How is this diagnosed? This condition is diagnosed with a medical history and physical exam. If you have discharge from your eye, the discharge may be tested to rule out other causes of conjunctivitis. How is this treated? Viral conjunctivitis does not respond to medicines that kill bacteria (antibiotics). Treatment for viral conjunctivitis is directed at stopping a bacterial infection  from developing in addition to the viral infection. Treatment also aims to relieve your symptoms, such as itching. This may be done with antihistamine drops or other eye medicines. Rarely, steroid eye drops or antiviral medicines may be prescribed. Follow these instructions at home: Medicines   Take or apply over-the-counter and prescription medicines only as told by your health care provider.  Be very careful to avoid touching the edge of the eyelid with the eye drop bottle or ointment tube when applying medicines to the affected eye. Being careful this way will stop you from spreading the infection to the other eye or to other people. Eye care  Avoid touching or rubbing your eyes.  Apply a warm, wet, clean washcloth to your eye for 10-20 minutes, 3-4 times per day or as told by your health care provider.  If you wear contact lenses, do not wear them until the inflammation is gone and your health care provider says it is safe to wear them again. Ask your health care provider how to sterilize or replace your contact lenses before using them again. Wear glasses until you can resume wearing contacts.  Avoid wearing eye makeup until the inflammation is gone. Throw away any old eye cosmetics that may be contaminated.  Gently wipe away any drainage from your eye with a warm, wet washcloth or a cotton ball. General instructions  Change or wash your pillowcase every day or as told by your health care provider.  Do not share towels, pillowcases, washcloths, eye makeup, makeup brushes, contact lenses, or glasses.  This may spread the infection.  Wash your hands often with soap and water. Use paper towels to dry your hands. If soap and water are not available, use hand sanitizer.  Try to avoid contact with other people for one week or as told by your health care provider. Contact a health care provider if:  Your symptoms do not improve with treatment or they get worse.  You have increased  pain.  Your vision becomes blurry.  You have a fever.  You have facial pain, redness, or swelling.  You have yellow or green drainage coming from your eye.  You have new symptoms. This information is not intended to replace advice given to you by your health care provider. Make sure you discuss any questions you have with your health care provider. Document Released: 01/16/2003 Document Revised: 05/23/2016 Document Reviewed: 05/12/2016 Elsevier Interactive Patient Education  2019 [employer].

## 2018-12-29 NOTE — Progress Notes (Signed)
Paige Lozano is a 53 y.o. female who presents to [place]: [place] today for pink eye.   Paige Lozano notes left eye irritation and conjunctival injection with some discharge. Symptoms started a few days ago. She notes a sick contact recently.  She feels pretty well otherwise with no significant cough congestion or runny nose.  She denies blurry vision.  She tried some Systane artificial tears and seemed to help.  She notes that her sister is currently sick.  She is receiving chemotherapy for stage II triple negative breast cancer and is immunocompromised.  She wants to know when she can visit her sister again.   ROS as above:  Exam:  BP 127/90   Pulse 81   Ht 5' 8.5" (1.74 m)   Wt 191 lb (86.6 kg)   BMI 28.62 kg/m  Wt Readings from Last 5 Encounters:  12/29/18 191 lb (86.6 kg)  12/01/18 195 lb (88.5 kg)  08/04/18 193 lb (87.5 kg)  08/01/18 194 lb (88 kg)  07/26/18 194 lb (88 kg)    Gen: Well NAD HEENT: EOMI,  MMM mild left-sided conjunctival injection.  No corneal abrasion visible.  Pupils are equal round and reactive to light. Lungs: Normal work of breathing. CTABL Heart: RRR no MRG Abd: NABS, Soft. Nondistended, Nontender Exts: Brisk capillary refill, warm and well perfused.   Lab and Radiology Results No results found for this or any previous visit (from the past 72 hour(s)). No results found.    Assessment and Plan: 53 y.o. female with conjunctivitis likely viral.  Symptomatic management with over-the-counter Systane artificial tears.  Additionally recommend Zaditor eyedrops.  Backup antibiotic eyedrop prescription printed with use if not improving or if worsening.  May visit sister when feeling better.  Discussed hand hygiene and precautions.  PDMP not reviewed this encounter. No orders of the defined types were placed in this encounter.  Meds ordered this  encounter  Medications  . trimethoprim-polymyxin b (POLYTRIM) ophthalmic solution    Sig: Place 2 drops into both eyes every 4 (four) hours.    Dispense:  10 mL    Refill:  0     Historical information moved to improve visibility of documentation.  Past Medical History:  Diagnosis Date  . Anxiety   . Bipolar 2 disorder, major depressive episode ([place])   . Complication of anesthesia   . COPD (chronic obstructive pulmonary disease) ([place])    smoker  . Fibromyalgia   . Insomnia   . Multiple sclerosis ([place])   . PONV (postoperative nausea and vomiting)   . Pulmonary embolism ([place]) 2000  . Sleep apnea    mild OSA no CPAP  . Ulcerative colitis [facility])    Past Surgical History:  Procedure Laterality Date  . FOOT SURGERY Left    bone spur  . HEMORRHOID SURGERY    . HUMERUS FRACTURE SURGERY Left   . MYOMECTOMY    . NASAL SINUS SURGERY    . SHOULDER ARTHROSCOPY WITH SUBACROMIAL DECOMPRESSION AND BICEP TENDON REPAIR Left 07/27/2016   Procedure: SHOULDER ARTHROSCOPY DEBRIDEMENT ROTATOR CUFF AND LABRUM, SUBACROMIAL DECOMPRESSION, BICEPS TENODESIS;  Surgeon: Tania Ade, MD;  Location: [place];  Service: Orthopedics;  Laterality: Left;  SHOULDER ARTHROSCOPY DEBRIDEMENT ROTATOR CUFF AND LABRUM, SUBACROMIAL DECOMPRESSION, BICEPS TENOTOMY, POSSIBLE TENODESIS  . THUMB ARTHROSCOPY     5 surgeries on left thumb, 4 surgeries on right  . TONSILLECTOMY AND ADENOIDECTOMY     Social History  Tobacco Use  . Smoking status: Former Smoker    Packs/day: 0.25    Types: Cigarettes    Last attempt to quit: 07/08/2016    Years since quitting: 2.4  . Smokeless tobacco: Never Used  Substance Use Topics  . Alcohol use: Yes    Alcohol/week: 0.0 standard drinks    Comment: rare    family history includes Breast cancer in her sister. She was adopted.  Medications: Current Outpatient Medications  Medication Sig Dispense Refill  . acetaminophen (TYLENOL) 650 MG CR tablet Take 1  tablet (650 mg total) by mouth every 8 (eight) hours as needed for pain. 90 tablet 3  . albuterol (PROVENTIL HFA;VENTOLIN HFA) 108 (90 Base) MCG/ACT inhaler Inhale 2 puffs into the lungs every 6 (six) hours as needed for wheezing or shortness of breath. 1 Inhaler 0  . balsalazide (COLAZAL) 750 MG capsule TAKE 3 CAPSULES (2,250 MG TOTAL) BY MOUTH 3 (THREE) TIMES DAILY. 270 capsule 0  . Cholecalciferol (VITAMIN D-1000 MAX ST) 1000 units tablet Take by mouth.    . clonazePAM (KLONOPIN) 0.5 MG tablet Take 1 tablet (0.5 mg total) by mouth 2 (two) times daily. prn 30 tablet 0  . fexofenadine (ALLEGRA ALLERGY) 180 MG tablet Take 1 tablet (180 mg total) by mouth daily. 90 tablet 4  . fluticasone (FLONASE) 50 MCG/ACT nasal spray Place 2 sprays into both nostrils daily. 16 g 3  . folic acid (FOLVITE) 1 MG tablet Take 1 mg by mouth daily.    . hydrOXYzine (VISTARIL) 25 MG capsule Take 1 capsule (25 mg total) by mouth 3 (three) times daily as needed. 90 capsule 1  . lamoTRIgine (LAMICTAL) 100 MG tablet Take 1 tablet (100 mg total) by mouth 2 (two) times daily. 60 tablet 3  . Multiple Vitamin (MULTI-VITAMINS) TABS Take by mouth.    . Natalizumab (TYSABRI IV) Inject into the vein.    Marland Kitchen QUEtiapine (SEROQUEL) 200 MG tablet Take 1 tablet (200 mg total) by mouth at bedtime. 30 tablet 5  . tiZANidine (ZANAFLEX) 4 MG tablet Take 4 mg by mouth 2 (two) times daily as needed.  2  . triamcinolone cream (KENALOG) 0.5 % Apply 1 application topically 2 (two) times daily. To affected areas. 30 g 3  . trimethoprim-polymyxin b (POLYTRIM) ophthalmic solution Place 2 drops into both eyes every 4 (four) hours. 10 mL 0   No current facility-administered medications for this visit.    Allergies  Allergen Reactions  . Ketamine Anaphylaxis  . Oxycodone Diarrhea  . Papaya Enzyme Anaphylaxis     Discussed warning signs or symptoms. Please see discharge instructions. Patient expresses understanding.

## 2019-01-04 DIAGNOSIS — S83511D Sprain of anterior cruciate ligament of right knee, subsequent encounter: Secondary | ICD-10-CM | POA: Diagnosis not present

## 2019-01-04 DIAGNOSIS — M797 Fibromyalgia: Secondary | ICD-10-CM | POA: Diagnosis not present

## 2019-01-04 DIAGNOSIS — G35 Multiple sclerosis: Secondary | ICD-10-CM | POA: Diagnosis not present

## 2019-01-04 DIAGNOSIS — M25561 Pain in right knee: Secondary | ICD-10-CM | POA: Diagnosis not present

## 2019-01-04 DIAGNOSIS — M25661 Stiffness of right knee, not elsewhere classified: Secondary | ICD-10-CM | POA: Diagnosis not present

## 2019-01-04 DIAGNOSIS — Z4789 Encounter for other orthopedic aftercare: Secondary | ICD-10-CM | POA: Diagnosis not present

## 2019-01-12 DIAGNOSIS — Z9889 Other specified postprocedural states: Secondary | ICD-10-CM | POA: Diagnosis not present

## 2019-01-12 DIAGNOSIS — S83511D Sprain of anterior cruciate ligament of right knee, subsequent encounter: Secondary | ICD-10-CM | POA: Diagnosis not present

## 2019-01-17 DIAGNOSIS — G35 Multiple sclerosis: Secondary | ICD-10-CM | POA: Diagnosis not present

## 2019-01-19 ENCOUNTER — Other Ambulatory Visit: Payer: Self-pay | Admitting: Physician Assistant

## 2019-01-20 NOTE — Telephone Encounter (Signed)
Last RX written 12/28/18 for #30 no RF  RX pended

## 2019-02-14 ENCOUNTER — Other Ambulatory Visit: Payer: Self-pay | Admitting: Physician Assistant

## 2019-02-23 ENCOUNTER — Encounter: Payer: Self-pay | Admitting: Family Medicine

## 2019-02-23 ENCOUNTER — Ambulatory Visit (INDEPENDENT_AMBULATORY_CARE_PROVIDER_SITE_OTHER): Payer: Medicare Other | Admitting: Family Medicine

## 2019-02-23 VITALS — BP 122/85 | HR 72 | Ht 68.5 in | Wt 189.0 lb

## 2019-02-23 DIAGNOSIS — R2689 Other abnormalities of gait and mobility: Secondary | ICD-10-CM

## 2019-02-23 DIAGNOSIS — M84375A Stress fracture, left foot, initial encounter for fracture: Secondary | ICD-10-CM | POA: Diagnosis not present

## 2019-02-23 DIAGNOSIS — R200 Anesthesia of skin: Secondary | ICD-10-CM | POA: Diagnosis not present

## 2019-02-23 DIAGNOSIS — M722 Plantar fascial fibromatosis: Secondary | ICD-10-CM

## 2019-02-23 DIAGNOSIS — Z79899 Other long term (current) drug therapy: Secondary | ICD-10-CM | POA: Diagnosis not present

## 2019-02-23 DIAGNOSIS — G35 Multiple sclerosis: Secondary | ICD-10-CM | POA: Diagnosis not present

## 2019-02-23 DIAGNOSIS — S83511D Sprain of anterior cruciate ligament of right knee, subsequent encounter: Secondary | ICD-10-CM | POA: Diagnosis not present

## 2019-02-23 DIAGNOSIS — F319 Bipolar disorder, unspecified: Secondary | ICD-10-CM | POA: Diagnosis not present

## 2019-02-23 DIAGNOSIS — R5383 Other fatigue: Secondary | ICD-10-CM | POA: Diagnosis not present

## 2019-02-23 NOTE — Progress Notes (Signed)
Paige Lozano is a 53 y.o. female who presents to [facility] today for left foot pain   Paige Lozano developed left plantar foot pain starting a few weeks ago.  She has been spending time with her sister who lives in [place] and is currently suffering from breast cancer.  She is been walking with her sister daily walking about 3 to 5 miles.  She notes this is increased from her typical walking duration prior to a few weeks ago.  She developed some plantar calcaneal foot pain consistent with previous episodes of plantar fasciitis.  She continued to walk and notes sometimes by the end of the walk she is limping quite badly walking on the lateral part of her foot.  Over the last week or so she is developed some pain in the lateral forefoot as well that she finds to be very bothersome.   She notes the plantar calcaneal pain is present with first standing from prolonged seated and with prolonged walking.  She is tried ice rest home exercises learned previously massage and even a night splint with little help.  She had a plantar fascial injection in August 2019 that worked quite well until recently.  Of note additionally she received some bad news regarding her multiple sclerosis.  She notes she is had some progression in MS and her medications may have to change.   Additionally she thinks she may have torn her ACL again and after a visit with orthopedics has an MRI pending for her right knee.   ROS:  As above  Exam:  BP 122/85   Pulse 72   Ht 5' 8.5" (1.74 m)   Wt 189 lb (85.7 kg)   BMI 28.32 kg/m  Wt Readings from Last 5 Encounters:  02/23/19 189 lb (85.7 kg)  12/29/18 191 lb (86.6 kg)  12/01/18 195 lb (88.5 kg)  08/04/18 193 lb (87.5 kg)  08/01/18 194 lb (88 kg)   General: Well Developed, well nourished, and in no acute distress.  Neuro/Psych: Alert and oriented x3, extra-ocular muscles intact, able to move all 4 extremities, sensation  grossly intact. Skin: Warm and dry, no rashes noted.  Respiratory: Not using accessory muscles, speaking in full sentences, trachea midline.  Cardiovascular: Pulses palpable, no extremity edema. Abdomen: Does not appear distended. MSK:  Left foot largely normal-appearing with no significant deformity or erythema or joint effusion. Tender palpation along dorsal lateral forefoot.  Additionally tender to palpation at plantar calcaneus. Normal foot and ankle motion. Pulses capillary fill and sensation are intact distally. Strength is intact. Stable ligamentous exam.    Lab and Radiology Results Limited musculoskeletal ultrasound of left forefoot reveals normal-appearing metatarsal shafts and heads with no obvious fracture at metatarsal shafts 2, 3, 4, and 5.  Thickened plantar fascia present at plantar calcaneus consistent in appearance with plantar fasciitis.  Procedure: Real-time Ultrasound Guided Injection of left plantar fascia Device: GE Logiq E   Images permanently stored and available for review in the ultrasound unit. Verbal informed consent obtained.  Discussed risks and benefits of procedure. Warned about infection bleeding damage to structures skin hypopigmentation and fat atrophy among others. Patient expresses understanding and agreement Time-out conducted.   Noted no overlying erythema, induration, or other signs of local infection.   Skin prepped in a sterile fashion.   Local anesthesia: Topical Ethyl chloride.   With sterile technique and under real time ultrasound guidance:  40 mg of Kenalog and 2 mL of Marcaine injected  easily.   Completed without difficulty   Pain partially resolved suggesting accurate placement of the medication.   Advised to call if fevers/chills, erythema, induration, drainage, or persistent bleeding.   Images permanently stored and available for review in the ultrasound unit.  Impression: Technically successful ultrasound guided injection.      Assessment and Plan: 53 y.o. female with left foot pain.  I believe the pain is multifactorial.  Her plantar calcaneal pain is very likely plantar fasciitis.  I think the plantar fasciitis returned because she increased her activity level with prolonged walking recently.   I believe the forefoot pain is because she has been limping and walking on the lateral portion of her foot during walks.  She does not have an obvious stress fracture or fracture on ultrasound imaging today.  Plan to treat with relative rest with cam walker boot and or knee scooter as needed.  Additionally will administer plantar fascia injection as above as that has provided significant benefit in the past and she is quite symptomatic today.  Plan to proceed with watchful waiting and recheck as needed.  When able resume home exercise program.   PDMP not reviewed this encounter. No orders of the defined types were placed in this encounter.  No orders of the defined types were placed in this encounter.   Historical information moved to improve visibility of documentation.  Past Medical History:  Diagnosis Date  . Anxiety   . Bipolar 2 disorder, major depressive episode ([place])   . Complication of anesthesia   . COPD (chronic obstructive pulmonary disease) ([place])    smoker  . Fibromyalgia   . Insomnia   . Multiple sclerosis ([place])   . PONV (postoperative nausea and vomiting)   . Pulmonary embolism ([place]) 2000  . Sleep apnea    mild OSA no CPAP  . Ulcerative colitis [facility])    Past Surgical History:  Procedure Laterality Date  . FOOT SURGERY Left    bone spur  . HEMORRHOID SURGERY    . HUMERUS FRACTURE SURGERY Left   . MYOMECTOMY    . NASAL SINUS SURGERY    . SHOULDER ARTHROSCOPY WITH SUBACROMIAL DECOMPRESSION AND BICEP TENDON REPAIR Left 07/27/2016   Procedure: SHOULDER ARTHROSCOPY DEBRIDEMENT ROTATOR CUFF AND LABRUM, SUBACROMIAL DECOMPRESSION, BICEPS TENODESIS;  Surgeon: Tania Ade, MD;  Location: [facility];  Service: Orthopedics;  Laterality: Left;  SHOULDER ARTHROSCOPY DEBRIDEMENT ROTATOR CUFF AND LABRUM, SUBACROMIAL DECOMPRESSION, BICEPS TENOTOMY, POSSIBLE TENODESIS  . THUMB ARTHROSCOPY     5 surgeries on left thumb, 4 surgeries on right  . TONSILLECTOMY AND ADENOIDECTOMY     Social History   Tobacco Use  . Smoking status: Former Smoker    Packs/day: 0.25    Types: Cigarettes    Last attempt to quit: 07/08/2016    Years since quitting: 2.6  . Smokeless tobacco: Never Used  Substance Use Topics  . Alcohol use: Yes    Alcohol/week: 0.0 standard drinks    Comment: rare    family history includes Breast cancer in her sister. She was adopted.  Medications: Current Outpatient Medications  Medication Sig Dispense Refill  . acetaminophen (TYLENOL) 650 MG CR tablet Take 1 tablet (650 mg total) by mouth every 8 (eight) hours as needed for pain. 90 tablet 3  . albuterol (PROVENTIL HFA;VENTOLIN HFA) 108 (90 Base) MCG/ACT inhaler Inhale 2 puffs into the lungs every 6 (six) hours as needed for wheezing or shortness of breath. 1 Inhaler 0  . balsalazide (  COLAZAL) 750 MG capsule TAKE 3 CAPSULES (2,250 MG TOTAL) BY MOUTH 3 (THREE) TIMES DAILY. 270 capsule 0  . Cholecalciferol (VITAMIN D-1000 MAX ST) 1000 units tablet Take by mouth.    . clonazePAM (KLONOPIN) 0.5 MG tablet TAKE ONE TABLET BY MOUTH TWICE A DAY AS NEEDED 30 tablet 0  . fexofenadine (ALLEGRA ALLERGY) 180 MG tablet Take 1 tablet (180 mg total) by mouth daily. 90 tablet 4  . fluticasone (FLONASE) 50 MCG/ACT nasal spray Place 2 sprays into both nostrils daily. 16 g 3  . folic acid (FOLVITE) 1 MG tablet Take 1 mg by mouth daily.    . hydrOXYzine (VISTARIL) 25 MG capsule Take 1 capsule (25 mg total) by mouth 3 (three) times daily as needed. 90 capsule 1  . lamoTRIgine (LAMICTAL) 100 MG tablet Take 1 tablet (100 mg total) by mouth 2 (two) times daily. 60 tablet 3  . Multiple Vitamin (MULTI-VITAMINS) TABS Take by mouth.    .  Natalizumab (TYSABRI IV) Inject into the vein.    Marland Kitchen QUEtiapine (SEROQUEL) 200 MG tablet Take 1 tablet (200 mg total) by mouth at bedtime. 30 tablet 5  . tiZANidine (ZANAFLEX) 4 MG tablet Take 4 mg by mouth 2 (two) times daily as needed.  2  . triamcinolone cream (KENALOG) 0.5 % Apply 1 application topically 2 (two) times daily. To affected areas. 30 g 3  . trimethoprim-polymyxin b (POLYTRIM) ophthalmic solution Place 2 drops into both eyes every 4 (four) hours. 10 mL 0   No current facility-administered medications for this visit.    Allergies  Allergen Reactions  . Ketamine Anaphylaxis  . Oxycodone Diarrhea  . Papaya Enzyme Anaphylaxis      Discussed warning signs or symptoms. Please see discharge instructions. Patient expresses understanding.

## 2019-02-23 NOTE — Patient Instructions (Addendum)
Thank you for coming in today. Call or go to the ER if you develop a large red swollen joint with extreme pain or oozing puss.   Use the cam walker boot for a few weeks with activity.  For longer walks consider a knee scooter.   Keep me updated.   Resume self PT exercises for plantar fasciitis   If we need to do an xray we can do it the same day as an upcoming MRI.    Plantar Fasciitis  Plantar fasciitis is a painful foot condition that affects the heel. It occurs when the band of tissue that connects the toes to the heel bone (plantar fascia) becomes irritated. This can happen as the result of exercising too much or doing other repetitive activities (overuse injury). The pain from plantar fasciitis can range from mild irritation to severe pain that makes it difficult to walk or move. The pain is usually worse in the morning after sleeping, or after sitting or lying down for a while. Pain may also be worse after long periods of walking or standing. What are the causes? This condition may be caused by:  Standing for long periods of time.  Wearing shoes that do not have good arch support.  Doing activities that put stress on joints (high-impact activities), including running, aerobics, and ballet.  Being overweight.  An abnormal way of walking (gait).  Tight muscles in the back of your lower leg (calf).  High arches in your feet.  Starting a new athletic activity. What are the signs or symptoms? The main symptom of this condition is heel pain. Pain may:  Be worse with first steps after a time of rest, especially in the morning after sleeping or after you have been sitting or lying down for a while.  Be worse after long periods of standing still.  Decrease after 30-45 minutes of activity, such as gentle walking. How is this diagnosed? This condition may be diagnosed based on your medical history and your symptoms. Your health care provider may ask questions about your  activity level. Your health care provider will do a physical exam to check for:  A tender area on the bottom of your foot.  A high arch in your foot.  Pain when you move your foot.  Difficulty moving your foot. You may have imaging tests to confirm the diagnosis, such as:  X-rays.  Ultrasound.  MRI. How is this treated? Treatment for plantar fasciitis depends on how severe your condition is. Treatment may include:  Rest, ice, applying pressure (compression), and raising the affected foot (elevation). This may be called RICE therapy. Your health care provider may recommend RICE therapy along with over-the-counter pain medicines to manage your pain.  Exercises to stretch your calves and your plantar fascia.  A splint that holds your foot in a stretched, upward position while you sleep (night splint).  Physical therapy to relieve symptoms and prevent problems in the future.  Injections of steroid medicine (cortisone) to relieve pain and inflammation.  Stimulating your plantar fascia with electrical impulses (extracorporeal shock wave therapy). This is usually the last treatment option before surgery.  Surgery, if other treatments have not worked after 12 months. Follow these instructions at home:  Managing pain, stiffness, and swelling  If directed, put ice on the painful area: ? Put ice in a plastic bag, or use a frozen bottle of water. ? Place a towel between your skin and the bag or bottle. ? Roll the bottom of  your foot over the bag or bottle. ? Do this for 20 minutes, 2-3 times a day.  Wear athletic shoes that have air-sole or gel-sole cushions, or try wearing soft shoe inserts that are designed for plantar fasciitis.  Raise (elevate) your foot above the level of your heart while you are sitting or lying down. Activity  Avoid activities that cause pain. Ask your health care provider what activities are safe for you.  Do physical therapy exercises and stretches as  told by your health care provider.  Try activities and forms of exercise that are easier on your joints (low-impact). Examples include swimming, water aerobics, and biking. General instructions  Take over-the-counter and prescription medicines only as told by your health care provider.  Wear a night splint while sleeping, if told by your health care provider. Loosen the splint if your toes tingle, become numb, or turn cold and blue.  Maintain a healthy weight, or work with your health care provider to lose weight as needed.  Keep all follow-up visits as told by your health care provider. This is important. Contact a health care provider if you:  Have symptoms that do not go away after caring for yourself at home.  Have pain that gets worse.  Have pain that affects your ability to move or do your daily activities. Summary  Plantar fasciitis is a painful foot condition that affects the heel. It occurs when the band of tissue that connects the toes to the heel bone (plantar fascia) becomes irritated.  The main symptom of this condition is heel pain that may be worse after exercising too much or standing still for a long time.  Treatment varies, but it usually starts with rest, ice, compression, and elevation (RICE therapy) and over-the-counter medicines to manage pain. This information is not intended to replace advice given to you by your health care provider. Make sure you discuss any questions you have with your health care provider. Document Released: 07/21/2001 Document Revised: 08/23/2017 Document Reviewed: 08/23/2017 Elsevier Interactive Patient Education  2019 [employer].

## 2019-03-01 DIAGNOSIS — S83511D Sprain of anterior cruciate ligament of right knee, subsequent encounter: Secondary | ICD-10-CM | POA: Diagnosis not present

## 2019-03-02 DIAGNOSIS — M2578 Osteophyte, vertebrae: Secondary | ICD-10-CM | POA: Diagnosis not present

## 2019-03-02 DIAGNOSIS — M50322 Other cervical disc degeneration at C5-C6 level: Secondary | ICD-10-CM | POA: Diagnosis not present

## 2019-03-02 DIAGNOSIS — G35 Multiple sclerosis: Secondary | ICD-10-CM | POA: Diagnosis not present

## 2019-03-02 DIAGNOSIS — M5134 Other intervertebral disc degeneration, thoracic region: Secondary | ICD-10-CM | POA: Diagnosis not present

## 2019-03-02 DIAGNOSIS — M47812 Spondylosis without myelopathy or radiculopathy, cervical region: Secondary | ICD-10-CM | POA: Diagnosis not present

## 2019-03-06 ENCOUNTER — Other Ambulatory Visit: Payer: Self-pay | Admitting: Physician Assistant

## 2019-03-06 NOTE — Telephone Encounter (Signed)
Left pt msg advising she needs appt with PCP for refill. Advised her to call for virtual visit.

## 2019-03-08 ENCOUNTER — Ambulatory Visit (INDEPENDENT_AMBULATORY_CARE_PROVIDER_SITE_OTHER): Payer: Medicare Other | Admitting: Physician Assistant

## 2019-03-08 VITALS — Ht 68.5 in | Wt 183.0 lb

## 2019-03-08 DIAGNOSIS — F430 Acute stress reaction: Secondary | ICD-10-CM

## 2019-03-08 DIAGNOSIS — K5100 Ulcerative (chronic) pancolitis without complications: Secondary | ICD-10-CM

## 2019-03-08 DIAGNOSIS — F3181 Bipolar II disorder: Secondary | ICD-10-CM | POA: Diagnosis not present

## 2019-03-08 DIAGNOSIS — F411 Generalized anxiety disorder: Secondary | ICD-10-CM

## 2019-03-08 MED ORDER — BALSALAZIDE DISODIUM 750 MG PO CAPS: 2250.0000 mg | ORAL_CAPSULE | Freq: Three times a day (TID) | ORAL | 1 refills | Status: DC

## 2019-03-08 MED ORDER — CLONAZEPAM 0.5 MG PO TABS: 0.5000 mg | ORAL_TABLET | Freq: Two times a day (BID) | ORAL | 0 refills | Status: DC | PRN

## 2019-03-08 NOTE — Progress Notes (Signed)
Patient ID: Paige Lozano, female   DOB: [birth year], 53 y.o.   MRN: [id number] .Marland KitchenVirtual Visit via Video Note  I connected with Paige Lozano on 03/13/19 at 10:10 AM EDT by a video enabled telemedicine application and verified that I am speaking with the correct person using two identifiers.   I discussed the limitations of evaluation and management by telemedicine and the availability of in person appointments. The patient expressed understanding and agreed to proceed.  History of Present Illness: Pt is a 53 yo female with Bipolar, anxiety, depression who calls in to discuss mood. She is not doing well. Her boyfriend of 5 years moved out this week. She is not only losing him she will lose her house too. She does not have support here. Her sister lives in [place] and mother and father in [place]. She feels "lost". She knows it is her bipolar why she lost her boyfriend. She does not feel like current psychiatrist is listening to her when she says she needs help. She has been on the same seroquel and lamictal for a while. She denies any plan to hurt herself but thinks about "her being better off dead". Counseling has helped some in the past but does not have money for it right now. She needs klonapin refill today. It does help to take the edge off so she does not cry all the time.   Currently UC is doing well. Needs refills on medication.   .. Active Ambulatory Problems    Diagnosis Date Noted  . Multiple sclerosis ([place]) 05/29/2016  . Fibromyalgia 05/29/2016  . 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 Insomnia 05/29/2016  . Ulcerative pancolitis without complication ([place]) 79/89/2119  . Bipolar 2 disorder ([place]) 05/29/2016  . Current smoker 05/29/2016  . Primary osteoarthritis of right knee with recent ACL tear and femoral condylar impaction fracture 06/01/2016  . Anxiety state 06/01/2016  . Left shoulder pain 06/01/2016  . GAD (generalized anxiety disorder) 07/20/2016  . History of pulmonary  embolus ([place]) 07/21/2016  . Right elbow pain 07/21/2016  . Sinusitis 11/30/2016  . Hot flashes 12/29/2016  . Body aches 12/29/2016  . Pruritus 12/29/2016  . Palpitations 03/08/2017  . Chest tightness 03/08/2017  . Tachycardia 03/08/2017  . Sinus tachycardia 03/12/2017  . PAC (premature atrial contraction) 03/12/2017  . Lumbar degenerative disc disease 06/02/2017  . Plantar fasciitis, bilateral 10/29/2017  . Onychomycosis 02/17/2018  . 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 Subcutaneous mass 04/07/2018  . Family history of breast cancer 12/02/2018  . Acute stress reaction 03/13/2019   Resolved Ambulatory Problems    Diagnosis Date Noted  . Right lateral epicondylitis 07/21/2016  . Right wrist injury 12/29/2016  . Hand injury, left, subsequent encounter 12/30/2016  . Chills (without fever) 03/08/2017   Past Medical History:  Diagnosis Date  . Anxiety   . Bipolar 2 disorder, major depressive episode ([place])   . Complication of anesthesia   . COPD (chronic obstructive pulmonary disease) ([place])   . PONV (postoperative nausea and vomiting)   . Pulmonary embolism ([place]) 2000  . Sleep apnea   . Ulcerative colitis ([place])    Reviewed med, allergy, problem list.    Observations/Objective: Pt was very tearful and emotional.  She did not wear make up but was dressed appropriately.   .. Today's Vitals   03/08/19 0928  Weight: 183 lb (83 kg)  Height: 5' 8.5" (1.74 m)   Body mass index is 27.42 kg/m.  .. Depression screen [facility] 2/9 03/08/2019 08/06/2017  Decreased Interest  3 0  Down, Depressed, Hopeless 3 0  PHQ - 2 Score 6 0  Altered sleeping 3 -  Tired, decreased energy 3 -  Change in appetite 3 -  Feeling bad or failure about yourself  3 -  Trouble concentrating 3 -  Moving slowly or fidgety/restless 0 -  Suicidal thoughts 0 -  PHQ-9 Score 21 -  Difficult doing work/chores Very difficult -   .. GAD 7 : Generalized Anxiety Score 03/08/2019  Nervous, Anxious, on Edge 3   Control/stop worrying 3  Worry too much - different things 3  Trouble relaxing 3  Restless 3  Easily annoyed or irritable 3  Afraid - awful might happen 0  Total GAD 7 Score 18  Anxiety Difficulty Very difficult     Assessment and Plan: Marland KitchenMarland KitchenAalijah was seen today for medication refill.  Diagnoses and all orders for this visit:  Acute stress reaction -     Ambulatory referral to Psychiatry -     clonazePAM (KLONOPIN) 0.5 MG tablet; Take 1 tablet (0.5 mg total) by mouth 2 (two) times daily as needed.  Ulcerative pancolitis without complication (HCC) -     balsalazide (COLAZAL) 750 MG capsule; Take 3 capsules (2,250 mg total) by mouth 3 (three) times daily.  GAD (generalized anxiety disorder) -     Ambulatory referral to Psychiatry  Bipolar 2 disorder [facility]) -     Ambulatory referral to Psychiatry  Anxiety state -     Ambulatory referral to Psychiatry -     clonazePAM (KLONOPIN) 0.5 MG tablet; Take 1 tablet (0.5 mg total) by mouth 2 (two) times daily as needed.   Made new referral for psychiatrist. Likely her medications and not maximized. I did give refill on klonapin. Discussed dependency risk and to use only when needed. Gave her a few more to help through transition. ..PDMP not reviewed this encounter.  Discussed other ways to help with stress, anxiety, mood. Follow up as needed. Call if no appt in next week.     Follow Up Instructions:    I discussed the assessment and treatment plan with the patient. The patient was provided an opportunity to ask questions and all were answered. The patient agreed with the plan and demonstrated an understanding of the instructions.   The patient was advised to call back or seek an in-person evaluation if the symptoms worsen or if the condition fails to improve as anticipated.  I provided 25 minutes of non-face-to-face time during this encounter.   Iran Planas, PA-C

## 2019-03-08 NOTE — Progress Notes (Deleted)
Patient not doing well right now. Trouble with boyfriend who is moving out. Having lots of stress and depression. PHQ9-GAD7 completed.

## 2019-03-13 ENCOUNTER — Encounter: Payer: Self-pay | Admitting: Physician Assistant

## 2019-03-13 DIAGNOSIS — F430 Acute stress reaction: Secondary | ICD-10-CM | POA: Insufficient documentation

## 2019-03-20 ENCOUNTER — Other Ambulatory Visit: Payer: Self-pay | Admitting: Neurology

## 2019-03-20 DIAGNOSIS — L299 Pruritus, unspecified: Secondary | ICD-10-CM

## 2019-03-20 MED ORDER — HYDROXYZINE PAMOATE 25 MG PO CAPS: 25.0000 mg | ORAL_CAPSULE | Freq: Three times a day (TID) | ORAL | 1 refills | Status: DC | PRN

## 2019-03-20 NOTE — Telephone Encounter (Signed)
Patient left vm that she is having issue with allergies and needs refill of hydroxyzine. She is in [place] and uses [facility], so would like this sent to [facility] on [address]. RX sent.

## 2019-03-24 ENCOUNTER — Telehealth: Payer: Self-pay | Admitting: Physician Assistant

## 2019-03-24 NOTE — Telephone Encounter (Signed)
Paige Lozano lvm at 4:40.  She is needing a refill on her Bioplar meds(she said Luvenia Starch knows that she has a lot going on) and some other meds)(she didn't say what those med were) which she said was supposed to have been called into pharmacy in [place]. She said she called behavioral health but nobody answered.  She is leaving [place] and heading to [place].Marland Kitchen

## 2019-03-27 ENCOUNTER — Telehealth (HOSPITAL_COMMUNITY): Payer: Self-pay | Admitting: Psychiatry

## 2019-03-27 MED ORDER — LAMOTRIGINE 100 MG PO TABS: 100.0000 mg | ORAL_TABLET | Freq: Two times a day (BID) | ORAL | 0 refills | Status: DC

## 2019-03-27 NOTE — Telephone Encounter (Signed)
Pt needs refill on Lamictal sent to [facility] in [place] on [address] Also please be aware she has upped it to taking 3 a day.  She is taking 2 at night.    [phone]

## 2019-03-27 NOTE — Telephone Encounter (Signed)
Sent to [place] [facility]. Twice a day or can take 2 at night already taking as stated

## 2019-03-27 NOTE — Telephone Encounter (Signed)
Pt was advised on Friday at 1700 to contact [facility] regarding Rx request- that is who write the medication.

## 2019-03-28 ENCOUNTER — Other Ambulatory Visit (HOSPITAL_COMMUNITY): Payer: Self-pay

## 2019-03-28 MED ORDER — LAMOTRIGINE 100 MG PO TABS: 100.0000 mg | ORAL_TABLET | Freq: Two times a day (BID) | ORAL | 0 refills | Status: DC

## 2019-03-28 NOTE — Telephone Encounter (Signed)
Resent medication. Patient informed

## 2019-03-28 NOTE — Telephone Encounter (Signed)
RX was sent to wrong pharmacy  ?

## 2019-04-04 ENCOUNTER — Telehealth: Payer: Self-pay | Admitting: Family Medicine

## 2019-04-04 NOTE — Telephone Encounter (Signed)
Pt called and stated her foot has not improved at all and is wondering what is next step to take.

## 2019-04-05 NOTE — Telephone Encounter (Signed)
Krystian states that foot is not better.  We probably should have a discussion about next steps via video visit.  Options include MRI or repeat injection or cam walker boot modification.  Please schedule video visit with me or in person visit.

## 2019-04-05 NOTE — Telephone Encounter (Signed)
Left a message for patient to call and schedule an appointment.

## 2019-04-06 DIAGNOSIS — G35 Multiple sclerosis: Secondary | ICD-10-CM | POA: Diagnosis not present

## 2019-04-06 DIAGNOSIS — R200 Anesthesia of skin: Secondary | ICD-10-CM | POA: Diagnosis not present

## 2019-04-06 DIAGNOSIS — R5383 Other fatigue: Secondary | ICD-10-CM | POA: Diagnosis not present

## 2019-04-06 DIAGNOSIS — F319 Bipolar disorder, unspecified: Secondary | ICD-10-CM | POA: Diagnosis not present

## 2019-04-17 DIAGNOSIS — M1711 Unilateral primary osteoarthritis, right knee: Secondary | ICD-10-CM | POA: Diagnosis not present

## 2019-04-18 ENCOUNTER — Ambulatory Visit (INDEPENDENT_AMBULATORY_CARE_PROVIDER_SITE_OTHER): Payer: Medicare Other | Admitting: Psychiatry

## 2019-04-18 ENCOUNTER — Encounter (HOSPITAL_COMMUNITY): Payer: Self-pay | Admitting: Psychiatry

## 2019-04-18 DIAGNOSIS — F4310 Post-traumatic stress disorder, unspecified: Secondary | ICD-10-CM | POA: Diagnosis not present

## 2019-04-18 DIAGNOSIS — F3181 Bipolar II disorder: Secondary | ICD-10-CM

## 2019-04-18 DIAGNOSIS — F411 Generalized anxiety disorder: Secondary | ICD-10-CM

## 2019-04-18 DIAGNOSIS — F5102 Adjustment insomnia: Secondary | ICD-10-CM

## 2019-04-18 MED ORDER — LAMOTRIGINE 150 MG PO TABS: 150.0000 mg | ORAL_TABLET | Freq: Two times a day (BID) | ORAL | 1 refills | Status: DC

## 2019-04-18 NOTE — Progress Notes (Signed)
[facility] Outpatient Follow up visit   Patient Identification: Paige Lozano MRN:  [id number] Date of Evaluation:  04/18/2019 Referral Source: [facility] Starch Primary care Chief Complaint:   Bipolar follow up  Visit Diagnosis:    ICD-10-CM   1. Bipolar 2 disorder (HCC) F31.81   2. PTSD (post-traumatic stress disorder) F43.10   3. GAD (generalized anxiety disorder) F41.1   4. Adjustment insomnia F51.02    I connected with [name] on 04/18/19 at  4:00 PM EDT by a video enabled telemedicine application and verified that I am speaking with the correct person using two identifiers.   I discussed the limitations of evaluation and management by telemedicine and the availability of in person appointments. The patient expressed understanding and agreed to proceed.  History of Present Illness:  53  years old Returns for follow-up and medication management  lamictal increase has helped, then she self increased to 300 per day. No rash Feels mood is balanced   Seen neurology for MS and fatigue.  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  Says it was for insomnia 200mg  helps sleep.   Aggravating factor: surgeries . Multiple medical .surgeries Modifying factor: dogs  Duration more then 10 years     Previous Psychotropic Medications: Yes   Substance Abuse History in the last 12 months:  Yes.   as per history Marijuana says it is medical for her condition.  Consequences of Substance Abuse: Medical Consequences:  fatigue, poor concenctration  Past Medical History:  Past Medical History:  Diagnosis Date  . Anxiety   . Bipolar 2 disorder, major depressive episode ([place])   . Complication of anesthesia   . COPD (chronic obstructive pulmonary disease) ([place])    smoker  . Fibromyalgia   . Insomnia   . Multiple sclerosis ([place])   . PONV (postoperative nausea and  vomiting)   . Pulmonary embolism ([place]) 2000  . Sleep apnea    mild OSA no CPAP  . Ulcerative colitis [facility])     Past Surgical History:  Procedure Laterality Date  . FOOT SURGERY Left    bone spur  . HEMORRHOID SURGERY    . HUMERUS FRACTURE SURGERY Left   . MYOMECTOMY    . NASAL SINUS SURGERY    . SHOULDER ARTHROSCOPY WITH SUBACROMIAL DECOMPRESSION AND BICEP TENDON REPAIR Left 07/27/2016   Procedure: SHOULDER ARTHROSCOPY DEBRIDEMENT ROTATOR CUFF AND LABRUM, SUBACROMIAL DECOMPRESSION, BICEPS TENODESIS;  Surgeon: Tania Ade, MD;  Location: [facility];  Service: Orthopedics;  Laterality: Left;  SHOULDER ARTHROSCOPY DEBRIDEMENT ROTATOR CUFF AND LABRUM, SUBACROMIAL DECOMPRESSION, BICEPS TENOTOMY, POSSIBLE TENODESIS  . THUMB ARTHROSCOPY     5 surgeries on left thumb, 4 surgeries on right  . TONSILLECTOMY AND ADENOIDECTOMY       Family History:  Family History  Adopted: Yes  Problem Relation Age of Onset  . Breast cancer Sister        Triple Negative    Social History:   Social History   Socioeconomic History  . Marital status: Divorced    Spouse name: Not on file  . Number of children: Not on file  . Years of education: Not on file  . Highest education level: Not on file  Occupational History  . Not on file  Social Needs  . Financial resource strain: Not on file  . Food insecurity:  Worry: Not on file    Inability: Not on file  . Transportation needs:    Medical: Not on file    Non-medical: Not on file  Tobacco Use  . Smoking status: Former Smoker    Packs/day: 0.25    Types: Cigarettes    Last attempt to quit: 07/08/2016    Years since quitting: 2.7  . Smokeless tobacco: Never Used  Substance and Sexual Activity  . Alcohol use: Yes    Alcohol/week: 0.0 standard drinks    Comment: rare   . Drug use: No    Types: Marijuana    Comment: last use 5 month ago  . Sexual activity: Yes    Partners: Male    Birth control/protection: None  Lifestyle   . Physical activity:    Days per week: Not on file    Minutes per session: Not on file  . Stress: Not on file  Relationships  . Social connections:    Talks on phone: Not on file    Gets together: Not on file    Attends religious service: Not on file    Active member of club or organization: Not on file    Attends meetings of clubs or organizations: Not on file    Relationship status: Not on file  Other Topics Concern  . Not on file  Social History Narrative  . Not on file     Allergies:   Allergies  Allergen Reactions  . Ketamine Anaphylaxis  . Oxycodone Diarrhea  . Papaya Enzyme Anaphylaxis    Metabolic Disorder Labs: No results found for: HGBA1C, MPG No results found for: PROLACTIN No results found for: CHOL, TRIG, HDL, CHOLHDL, VLDL, LDLCALC   Current Medications: Current Outpatient Medications  Medication Sig Dispense Refill  . acetaminophen (TYLENOL) 650 MG CR tablet Take 1 tablet (650 mg total) by mouth every 8 (eight) hours as needed for pain. 90 tablet 3  . albuterol (PROVENTIL HFA;VENTOLIN HFA) 108 (90 Base) MCG/ACT inhaler Inhale 2 puffs into the lungs every 6 (six) hours as needed for wheezing or shortness of breath. 1 Inhaler 0  . balsalazide (COLAZAL) 750 MG capsule Take 3 capsules (2,250 mg total) by mouth 3 (three) times daily. 270 capsule 1  . Cholecalciferol (VITAMIN D-1000 MAX ST) 1000 units tablet Take by mouth.    . clonazePAM (KLONOPIN) 0.5 MG tablet Take 1 tablet (0.5 mg total) by mouth 2 (two) times daily as needed. 60 tablet 0  . fexofenadine (ALLEGRA ALLERGY) 180 MG tablet Take 1 tablet (180 mg total) by mouth daily. 90 tablet 4  . fluticasone (FLONASE) 50 MCG/ACT nasal spray Place 2 sprays into both nostrils daily. 16 g 3  . folic acid (FOLVITE) 1 MG tablet Take 1 mg by mouth daily.    . hydrOXYzine (VISTARIL) 25 MG capsule Take 1 capsule (25 mg total) by mouth 3 (three) times daily as needed. 90 capsule 1  . lamoTRIgine (LAMICTAL) 150 MG  tablet Take 1 tablet (150 mg total) by mouth 2 (two) times daily. 60 tablet 1  . Multiple Vitamin (MULTI-VITAMINS) TABS Take by mouth.    . QUEtiapine (SEROQUEL) 200 MG tablet Take 1 tablet (200 mg total) by mouth at bedtime. 30 tablet 5  . triamcinolone cream (KENALOG) 0.5 % Apply 1 application topically 2 (two) times daily. To affected areas. 30 g 3   No current facility-administered medications for this visit.       Psychiatric Specialty Exam: Review of Systems  Cardiovascular: Negative  for chest pain and palpitations.  Musculoskeletal: Positive for myalgias.  Skin: Negative for rash.  Neurological: Negative for tremors.  Psychiatric/Behavioral: Negative for suicidal ideas.    There were no vitals taken for this visit.There is no height or weight on file to calculate BMI.  General Appearance: Casual  Eye Contact:  Fair  Speech:  Normal Rate  Volume:  normal  Mood:fair  Affect:  congruent  Thought Process:  Goal Directed  Orientation:  Full (Time, Place, and Person)  Thought Content:  Logical  Suicidal Thoughts:  No  Homicidal Thoughts:  No  Memory:  Immediate;   Fair Recent;   Fair  Judgement:  Fair  Insight:  Fair  Psychomotor Activity:  Normal  Concentration:  Concentration: Fair and Attention Span: Fair  Recall:  [employer] of Knowledge:Fair  Language: Fair  Akathisia:  Negative  Handed:  Right  AIMS (if indicated):    Assets:  Desire for Improvement  ADL's:  Intact  Cognition: WNL  Sleep:  Fair while on meds    Treatment Plan Summary: Medication management and Plan as follows  Bipolar 2: feels balanced, lamictal now at 300mg . Will write 150mg  bid  GAD: balanced on prn klonopine Consider therapy if needed   Insomnia: fluctuates. Reviewed sleep hygiene. Getting seroquel from primary care  avoid phenteramine or stimulants FU 46m or earlier if needed I discussed the assessment and treatment plan with the patient. The patient was provided an opportunity to  ask questions and all were answered. The patient agreed with the plan and demonstrated an understanding of the instructions.   The patient was advised to call back or seek an in-person evaluation if the symptoms worsen or if the condition fails to improve as anticipated. Fu 24m.   Merian Capron, MD 6/9/20204:18 PM

## 2019-05-09 ENCOUNTER — Encounter: Payer: Self-pay | Admitting: Family Medicine

## 2019-05-09 ENCOUNTER — Ambulatory Visit (INDEPENDENT_AMBULATORY_CARE_PROVIDER_SITE_OTHER): Payer: Medicare Other | Admitting: Family Medicine

## 2019-05-09 VITALS — BP 118/79 | HR 103 | Wt 185.0 lb

## 2019-05-09 DIAGNOSIS — M722 Plantar fascial fibromatosis: Secondary | ICD-10-CM

## 2019-05-09 NOTE — Progress Notes (Signed)
Paige Lozano is a 53 y.o. female who presents to [facility] today for exacerbation of plantar fasciitis in bilateral feet. Patient has had ongoing plantar fasciitis in left foot, right foot started approx 1 month ago. Pain is worse with walking and improves with rest. Pain is affecting patient's gait and makes it difficult to walk. Patient has received several steroid injections in left foot but pain has recurred.  Last injection was around 3 months ago.    ROS:  As above  Exam:  BP 118/79   Pulse (!) 103   Wt 185 lb (83.9 kg)   SpO2 96%   BMI 27.72 kg/m  Wt Readings from Last 5 Encounters:  05/09/19 185 lb (83.9 kg)  03/08/19 183 lb (83 kg)  02/23/19 189 lb (85.7 kg)  12/29/18 191 lb (86.6 kg)  12/01/18 195 lb (88.5 kg)   General: Well Developed, well nourished, and in no acute distress.  Neuro/Psych: Alert and oriented x3, extra-ocular muscles intact, able to move all 4 extremities, sensation grossly intact. Skin: Warm and dry, no rashes noted.  Respiratory: Not using accessory muscles, speaking in full sentences, trachea midline.  Cardiovascular: Pulses palpable, no extremity edema. Abdomen: Does not appear distended. MSK: Tender to palpation along plantar aspect of calcaneus bilaterally.  Patient has an antalgic gait.    Procedure: Real-time Ultrasound Guided Injection of right plantar fascia Device: GE Logiq E   Images permanently stored and available for review in the ultrasound unit. Verbal informed consent obtained.  Discussed risks and benefits of procedure. Warned about infection bleeding damage to structures skin hypopigmentation and fat atrophy among others. Patient expresses understanding and agreement Time-out conducted.   Noted no overlying erythema, induration, or other signs of local infection.   Skin prepped in a sterile fashion.   Local anesthesia: Topical Ethyl chloride.   With sterile technique and under  real time ultrasound guidance:  40 mg of Kenalog and 2 mL of Marcaine injected easily.   Completed without difficulty   Pain immediately resolved suggesting accurate placement of the medication.   Advised to call if fevers/chills, erythema, induration, drainage, or persistent bleeding.   Images permanently stored and available for review in the ultrasound unit.  Impression: Technically successful ultrasound guided injection.     Procedure: Real-time Ultrasound Guided Injection of left plantar fascia Device: GE Logiq E   Images permanently stored and available for review in the ultrasound unit. Verbal informed consent obtained.  Discussed risks and benefits of procedure. Warned about infection bleeding damage to structures skin hypopigmentation and fat atrophy among others. Patient expresses understanding and agreement Time-out conducted.   Noted no overlying erythema, induration, or other signs of local infection.   Skin prepped in a sterile fashion.   Local anesthesia: Topical Ethyl chloride.   With sterile technique and under real time ultrasound guidance:  40 milligrams of Kenalog and 2 mL of Marcaine injected easily.   Completed without difficulty   Pain immediately resolved suggesting accurate placement of the medication.   Advised to call if fevers/chills, erythema, induration, drainage, or persistent bleeding.   Images permanently stored and available for review in the ultrasound unit.  Impression: Technically successful ultrasound guided injection.         Assessment and Plan: 53 y.o. female with BL plantar fasciitis that is impacting patient's gait. Patient has received several steroid injections in left foot but pain has recurred. Performed US-guided steroid injection BL to relieve symptoms.  Continue  exercise program at home.  Recheck as needed.   PDMP not reviewed this encounter. No orders of the defined types were placed in this encounter.  No orders of the  defined types were placed in this encounter.   Historical information moved to improve visibility of documentation.  Past Medical History:  Diagnosis Date  . Anxiety   . Bipolar 2 disorder, major depressive episode ([place])   . Complication of anesthesia   . COPD (chronic obstructive pulmonary disease) ([place])    smoker  . Fibromyalgia   . Insomnia   . Multiple sclerosis ([place])   . PONV (postoperative nausea and vomiting)   . Pulmonary embolism ([place]) 2000  . Sleep apnea    mild OSA no CPAP  . Ulcerative colitis [facility])    Past Surgical History:  Procedure Laterality Date  . FOOT SURGERY Left    bone spur  . HEMORRHOID SURGERY    . HUMERUS FRACTURE SURGERY Left   . MYOMECTOMY    . NASAL SINUS SURGERY    . SHOULDER ARTHROSCOPY WITH SUBACROMIAL DECOMPRESSION AND BICEP TENDON REPAIR Left 07/27/2016   Procedure: SHOULDER ARTHROSCOPY DEBRIDEMENT ROTATOR CUFF AND LABRUM, SUBACROMIAL DECOMPRESSION, BICEPS TENODESIS;  Surgeon: Tania Ade, MD;  Location: [facility];  Service: Orthopedics;  Laterality: Left;  SHOULDER ARTHROSCOPY DEBRIDEMENT ROTATOR CUFF AND LABRUM, SUBACROMIAL DECOMPRESSION, BICEPS TENOTOMY, POSSIBLE TENODESIS  . THUMB ARTHROSCOPY     5 surgeries on left thumb, 4 surgeries on right  . TONSILLECTOMY AND ADENOIDECTOMY     Social History   Tobacco Use  . Smoking status: Former Smoker    Packs/day: 0.25    Types: Cigarettes    Quit date: 07/08/2016    Years since quitting: 2.8  . Smokeless tobacco: Never Used  Substance Use Topics  . Alcohol use: Yes    Alcohol/week: 0.0 standard drinks    Comment: rare    family history includes Breast cancer in her sister. She was adopted.  Medications: Current Outpatient Medications  Medication Sig Dispense Refill  . acetaminophen (TYLENOL) 650 MG CR tablet Take 1 tablet (650 mg total) by mouth every 8 (eight) hours as needed for pain. 90 tablet 3  . albuterol (PROVENTIL HFA;VENTOLIN HFA) 108 (90 Base) MCG/ACT  inhaler Inhale 2 puffs into the lungs every 6 (six) hours as needed for wheezing or shortness of breath. 1 Inhaler 0  . balsalazide (COLAZAL) 750 MG capsule Take 3 capsules (2,250 mg total) by mouth 3 (three) times daily. 270 capsule 1  . Cholecalciferol (VITAMIN D-1000 MAX ST) 1000 units tablet Take by mouth.    . clonazePAM (KLONOPIN) 0.5 MG tablet Take 1 tablet (0.5 mg total) by mouth 2 (two) times daily as needed. 60 tablet 0  . fexofenadine (ALLEGRA ALLERGY) 180 MG tablet Take 1 tablet (180 mg total) by mouth daily. 90 tablet 4  . fluticasone (FLONASE) 50 MCG/ACT nasal spray Place 2 sprays into both nostrils daily. 16 g 3  . folic acid (FOLVITE) 1 MG tablet Take 1 mg by mouth daily.    . hydrOXYzine (VISTARIL) 25 MG capsule Take 1 capsule (25 mg total) by mouth 3 (three) times daily as needed. 90 capsule 1  . lamoTRIgine (LAMICTAL) 150 MG tablet Take 1 tablet (150 mg total) by mouth 2 (two) times daily. 60 tablet 1  . Multiple Vitamin (MULTI-VITAMINS) TABS Take by mouth.    . QUEtiapine (SEROQUEL) 200 MG tablet Take 1 tablet (200 mg total) by mouth at bedtime. 30 tablet 5  .  triamcinolone cream (KENALOG) 0.5 % Apply 1 application topically 2 (two) times daily. To affected areas. 30 g 3   No current facility-administered medications for this visit.    Allergies  Allergen Reactions  . Ketamine Anaphylaxis  . Oxycodone Diarrhea  . Papaya Enzyme Anaphylaxis      Discussed warning signs or symptoms. Please see discharge instructions. Patient expresses understanding.  I personally was present and performed or re-performed the history, physical exam and medical decision-making activities of this service and have verified that the service and findings are accurately documented in the student's note. ___________________________________________ Lynne Leader M.D., ABFM., CAQSM. Primary Care and Sports Medicine Adjunct Instructor of [place] [employer] of  Medicine

## 2019-05-09 NOTE — Patient Instructions (Signed)
Thank you for coming in today. Call or go to the ER if you develop a large red swollen joint with extreme pain or oozing puss.  Recheck sooner if needed.   Continue plantar fasciitis exercises.

## 2019-05-16 ENCOUNTER — Telehealth: Payer: Self-pay | Admitting: Physician Assistant

## 2019-05-16 DIAGNOSIS — F411 Generalized anxiety disorder: Secondary | ICD-10-CM

## 2019-05-16 DIAGNOSIS — F430 Acute stress reaction: Secondary | ICD-10-CM

## 2019-05-16 MED ORDER — CLONAZEPAM 0.5 MG PO TABS: 0.5000 mg | ORAL_TABLET | Freq: Two times a day (BID) | ORAL | 0 refills | Status: DC | PRN

## 2019-05-16 NOTE — Telephone Encounter (Signed)
Patient calls and is out of town expected to be back by now but is going through something and will not be back in town for a while and needs her Klonopin faxed to pharmacy where she is at. Has been out for 2 days and has had 4 panic attacks since then. Fax to [facility] at [place]. KG LPN

## 2019-05-16 NOTE — Telephone Encounter (Signed)
#  [phone] sent to [place] with [facility] Starch in the next 1-2 weeks for additional refills

## 2019-05-17 NOTE — Telephone Encounter (Signed)
Left message advising of recommendations.  

## 2019-05-25 ENCOUNTER — Other Ambulatory Visit: Payer: Self-pay

## 2019-05-25 DIAGNOSIS — M791 Myalgia, unspecified site: Secondary | ICD-10-CM | POA: Diagnosis not present

## 2019-05-25 DIAGNOSIS — G894 Chronic pain syndrome: Secondary | ICD-10-CM | POA: Diagnosis not present

## 2019-05-25 MED ORDER — FLUTICASONE PROPIONATE 50 MCG/ACT NA SUSP: 2.0000 | Freq: Every day | NASAL | 3 refills | Status: DC

## 2019-06-01 DIAGNOSIS — R5383 Other fatigue: Secondary | ICD-10-CM | POA: Diagnosis not present

## 2019-06-01 DIAGNOSIS — G35 Multiple sclerosis: Secondary | ICD-10-CM | POA: Diagnosis not present

## 2019-06-01 DIAGNOSIS — R413 Other amnesia: Secondary | ICD-10-CM | POA: Diagnosis not present

## 2019-06-01 DIAGNOSIS — R200 Anesthesia of skin: Secondary | ICD-10-CM | POA: Diagnosis not present

## 2019-06-05 ENCOUNTER — Ambulatory Visit (INDEPENDENT_AMBULATORY_CARE_PROVIDER_SITE_OTHER): Payer: Medicare Other | Admitting: Family Medicine

## 2019-06-05 ENCOUNTER — Other Ambulatory Visit: Payer: Self-pay

## 2019-06-05 VITALS — BP 133/87 | HR 88 | Temp 98.2°F | Wt 182.0 lb

## 2019-06-05 DIAGNOSIS — M62838 Other muscle spasm: Secondary | ICD-10-CM

## 2019-06-05 DIAGNOSIS — M1711 Unilateral primary osteoarthritis, right knee: Secondary | ICD-10-CM | POA: Diagnosis not present

## 2019-06-05 MED ORDER — HYDROCODONE-ACETAMINOPHEN 5-325 MG PO TABS: 1.0000 | ORAL_TABLET | Freq: Four times a day (QID) | ORAL | 0 refills | Status: DC | PRN

## 2019-06-05 MED ORDER — CELECOXIB 200 MG PO CAPS: ORAL_CAPSULE | ORAL | 2 refills | Status: DC

## 2019-06-05 MED ORDER — METHOCARBAMOL 500 MG PO TABS: 500.0000 mg | ORAL_TABLET | Freq: Three times a day (TID) | ORAL | 0 refills | Status: DC

## 2019-06-05 NOTE — Patient Instructions (Signed)
Thank you for coming in today. Use heating pad and TENS unit.  Let me know if you want PT referral.  Use celebrex with tylenol for pain.  Ok to take hydrocodone as well but be careful about tylenol dose. Max dose is 1000mg  4x daily.   Use the muscle relaxer as well.   Come back or go to the emergency room if you notice new weakness new numbness problems walking or bowel or bladder problems.  TENS UNIT: This is helpful for muscle pain and spasm.   Search and Purchase a TENS 7000 2nd edition at  www.tenspros.com or www.[place].com It should be less than $30.     TENS unit instructions: Do not shower or bathe with the unit on Turn the unit off before removing electrodes or batteries If the electrodes lose stickiness add a drop of water to the electrodes after they are disconnected from the unit and place on plastic sheet. If you continued to have difficulty, call the TENS unit company to purchase more electrodes. Do not apply lotion on the skin area prior to use. Make sure the skin is clean and dry as this will help prolong the life of the electrodes. After use, always check skin for unusual red areas, rash or other skin difficulties. If there are any skin problems, does not apply electrodes to the same area. Never remove the electrodes from the unit by pulling the wires. Do not use the TENS unit or electrodes other than as directed. Do not change electrode placement without consultating your therapist or physician. Keep 2 fingers with between each electrode. Wear time ratio is 2:1, on to off times.    For example on for 30 minutes off for 15 minutes and then on for 30 minutes off for 15 minutes     Cervical Strain and Sprain Rehab Ask your health care provider which exercises are safe for you. Do exercises exactly as told by your health care provider and adjust them as directed. It is normal to feel mild stretching, pulling, tightness, or discomfort as you do these exercises. Stop  right away if you feel sudden pain or your pain gets worse. Do not begin these exercises until told by your health care provider. Stretching and range-of-motion exercises Cervical side bending  1. Using good posture, sit on a stable chair or stand up. 2. Without moving your shoulders, slowly tilt your left / right ear to your shoulder until you feel a stretch in the opposite side neck muscles. You should be looking straight ahead. 3. Hold for __________ seconds. 4. Repeat with the other side of your neck. Repeat __________ times. Complete this exercise __________ times a day. Cervical rotation  1. Using good posture, sit on a stable chair or stand up. 2. Slowly turn your head to the side as if you are looking over your left / right shoulder. ? Keep your eyes level with the ground. ? Stop when you feel a stretch along the side and the back of your neck. 3. Hold for __________ seconds. 4. Repeat this by turning to your other side. Repeat __________ times. Complete this exercise __________ times a day. Thoracic extension and pectoral stretch 1. Roll a towel or a small blanket so it is about 4 inches (10 cm) in diameter. 2. Lie down on your back on a firm surface. 3. Put the towel lengthwise, under your spine in the middle of your back. It should not be under your shoulder blades. The towel should line  up with your spine from your middle back to your lower back. 4. Put your hands behind your head and let your elbows fall out to your sides. 5. Hold for __________ seconds. Repeat __________ times. Complete this exercise __________ times a day. Strengthening exercises Isometric upper cervical flexion 1. Lie on your back with a thin pillow behind your head and a small rolled-up towel under your neck. 2. Gently tuck your chin toward your chest and nod your head down to look toward your feet. Do not lift your head off the pillow. 3. Hold for __________ seconds. 4. Release the tension slowly.  Relax your neck muscles completely before you repeat this exercise. Repeat __________ times. Complete this exercise __________ times a day. Isometric cervical extension  1. Stand about 6 inches (15 cm) away from a wall, with your back facing the wall. 2. Place a soft object, about 6-8 inches (15-20 cm) in diameter, between the back of your head and the wall. A soft object could be a small pillow, a ball, or a folded towel. 3. Gently tilt your head back and press into the soft object. Keep your jaw and forehead relaxed. 4. Hold for __________ seconds. 5. Release the tension slowly. Relax your neck muscles completely before you repeat this exercise. Repeat __________ times. Complete this exercise __________ times a day. Posture and body mechanics Body mechanics refers to the movements and positions of your body while you do your daily activities. Posture is part of body mechanics. Good posture and healthy body mechanics can help to relieve stress in your body's tissues and joints. Good posture means that your spine is in its natural S-curve position (your spine is neutral), your shoulders are pulled back slightly, and your head is not tipped forward. The following are general guidelines for applying improved posture and body mechanics to your everyday activities. Sitting  1. When sitting, keep your spine neutral and keep your feet flat on the floor. Use a footrest, if necessary, and keep your thighs parallel to the floor. Avoid rounding your shoulders, and avoid tilting your head forward. 2. When working at a desk or a computer, keep your desk at a height where your hands are slightly lower than your elbows. Slide your chair under your desk so you are close enough to maintain good posture. 3. When working at a computer, place your monitor at a height where you are looking straight ahead and you do not have to tilt your head forward or downward to look at the screen. Standing   When standing, keep  your spine neutral and keep your feet about hip-width apart. Keep a slight bend in your knees. Your ears, shoulders, and hips should line up.  When you do a task in which you stand in one place for a long time, place one foot up on a stable object that is 2-4 inches (5-10 cm) high, such as a footstool. This helps keep your spine neutral. Resting When lying down and resting, avoid positions that are most painful for you. Try to support your neck in a neutral position. You can use a contour pillow or a small rolled-up towel. Your pillow should support your neck but not push on it. This information is not intended to replace advice given to you by your health care provider. Make sure you discuss any questions you have with your health care provider. Document Released: 10/26/2005 Document Revised: 02/15/2019 Document Reviewed: 07/27/2018 Elsevier Patient Education  2020 [employer].

## 2019-06-05 NOTE — Progress Notes (Signed)
Paige Lozano is a 53 y.o. female who presents to [facility] today for a 2-day Hx of severe neck pain. Pain is severe over the left cervical paraspinal and parascapular regions. Pain is interfering with activities of daily living. Patient is unable to abduct left shoulder above 90 degrees. Patient does not recall specific injury event although she notes at an extensive amount of swimming on Friday might have contributed. Patient has been taking Tylenol without improvement.  No radiating pain weakness or numbness distally beyond elbow.      ROS:  As above  Exam:  BP 133/87   Pulse 88   Temp 98.2 F (36.8 C)   Wt 182 lb (82.6 kg)   BMI 27.27 kg/m   Wt Readings from Last 5 Encounters:  06/05/19 182 lb (82.6 kg)  05/09/19 185 lb (83.9 kg)  03/08/19 183 lb (83 kg)  02/23/19 189 lb (85.7 kg)  12/29/18 191 lb (86.6 kg)   General: Well Developed, well nourished, and in no acute distress.  Neuro/Psych: Alert and oriented x3, extra-ocular muscles intact, able to move all 4 extremities, sensation grossly intact. Skin: Warm and dry, no rashes noted.  Respiratory: Not using accessory muscles, speaking in full sentences, trachea midline.  Cardiovascular: Pulses palpable, no extremity edema. Abdomen: Does not appear distended. MSK: Exquisite tenderness to palpation over left cervical paraspinal and parascapular regions.  Nontender spinal midline.  Normal appearance, no swelling or effusion. Inability to abduct left should above 90 degrees. pain and stiffness with neck forward and lateral flexion and extension. No midline tenderness. Normal grip strength BL. Normal strength with shoulder abduction and adduction, elbow flexion and extension BL. Negative Hawkins test. Reflexes and sensation strength intact distally.       Assessment and Plan: 53 y.o. female with 2-day Hx exquisite pain and stiffness in left cervical paraspinal and parascapular  regions with presentation consistent for cervical muscle strain. Recommend rest, heating pad and TENS unit use. Will refer for PT if patient requests. Prescribed celebrex with tylenol for pain. Prescribed hydrocodone but counseled to be cautious with tylenol dose. Prescribed robaxin to relax muscles.   PDMP reviewed during this encounter. No orders of the defined types were placed in this encounter.  Meds ordered this encounter  Medications  . methocarbamol (ROBAXIN) 500 MG tablet    Sig: Take 1 tablet (500 mg total) by mouth 3 (three) times daily.    Dispense:  90 tablet    Refill:  0  . celecoxib (CELEBREX) 200 MG capsule    Sig: One to 2 tablets by mouth daily as needed for pain.    Dispense:  60 capsule    Refill:  2  . HYDROcodone-acetaminophen (NORCO/VICODIN) 5-325 MG tablet    Sig: Take 1 tablet by mouth every 6 (six) hours as needed.    Dispense:  15 tablet    Refill:  0    Historical information moved to improve visibility of documentation.  Past Medical History:  Diagnosis Date  . Anxiety   . Bipolar 2 disorder, major depressive episode ([place])   . Complication of anesthesia   . COPD (chronic obstructive pulmonary disease) ([place])    smoker  . Fibromyalgia   . Insomnia   . Multiple sclerosis ([place])   . PONV (postoperative nausea and vomiting)   . Pulmonary embolism ([place]) 2000  . Sleep apnea    mild OSA no CPAP  . Ulcerative colitis [facility])    Past Surgical History:  Procedure Laterality Date  . FOOT SURGERY Left    bone spur  . HEMORRHOID SURGERY    . HUMERUS FRACTURE SURGERY Left   . MYOMECTOMY    . NASAL SINUS SURGERY    . SHOULDER ARTHROSCOPY WITH SUBACROMIAL DECOMPRESSION AND BICEP TENDON REPAIR Left 07/27/2016   Procedure: SHOULDER ARTHROSCOPY DEBRIDEMENT ROTATOR CUFF AND LABRUM, SUBACROMIAL DECOMPRESSION, BICEPS TENODESIS;  Surgeon: Tania Ade, MD;  Location: [place];  Service: Orthopedics;  Laterality: Left;  SHOULDER ARTHROSCOPY  DEBRIDEMENT ROTATOR CUFF AND LABRUM, SUBACROMIAL DECOMPRESSION, BICEPS TENOTOMY, POSSIBLE TENODESIS  . THUMB ARTHROSCOPY     5 surgeries on left thumb, 4 surgeries on right  . TONSILLECTOMY AND ADENOIDECTOMY     Social History   Tobacco Use  . Smoking status: Former Smoker    Packs/day: 0.25    Types: Cigarettes    Quit date: 07/08/2016    Years since quitting: 2.9  . Smokeless tobacco: Never Used  Substance Use Topics  . Alcohol use: Yes    Alcohol/week: 0.0 standard drinks    Comment: rare    family history includes Breast cancer in her sister. She was adopted.  Medications: Current Outpatient Medications  Medication Sig Dispense Refill  . acetaminophen (TYLENOL) 650 MG CR tablet Take 1 tablet (650 mg total) by mouth every 8 (eight) hours as needed for pain. 90 tablet 3  . albuterol (PROVENTIL HFA;VENTOLIN HFA) 108 (90 Base) MCG/ACT inhaler Inhale 2 puffs into the lungs every 6 (six) hours as needed for wheezing or shortness of breath. 1 Inhaler 0  . balsalazide (COLAZAL) 750 MG capsule Take 3 capsules (2,250 mg total) by mouth 3 (three) times daily. 270 capsule 1  . celecoxib (CELEBREX) 200 MG capsule One to 2 tablets by mouth daily as needed for pain. 60 capsule 2  . Cholecalciferol (VITAMIN D-1000 MAX ST) 1000 units tablet Take by mouth.    . clonazePAM (KLONOPIN) 0.5 MG tablet Take 1 tablet (0.5 mg total) by mouth 2 (two) times daily as needed. 30 tablet 0  . fexofenadine (ALLEGRA ALLERGY) 180 MG tablet Take 1 tablet (180 mg total) by mouth daily. 90 tablet 4  . fluticasone (FLONASE) 50 MCG/ACT nasal spray Place 2 sprays into both nostrils daily. 16 g 3  . folic acid (FOLVITE) 1 MG tablet Take 1 mg by mouth daily.    Marland Kitchen HYDROcodone-acetaminophen (NORCO/VICODIN) 5-325 MG tablet Take 1 tablet by mouth every 6 (six) hours as needed. 15 tablet 0  . hydrOXYzine (VISTARIL) 25 MG capsule Take 1 capsule (25 mg total) by mouth 3 (three) times daily as needed. 90 capsule 1  . lamoTRIgine  (LAMICTAL) 150 MG tablet Take 1 tablet (150 mg total) by mouth 2 (two) times daily. 60 tablet 1  . methocarbamol (ROBAXIN) 500 MG tablet Take 1 tablet (500 mg total) by mouth 3 (three) times daily. 90 tablet 0  . Multiple Vitamin (MULTI-VITAMINS) TABS Take by mouth.    . QUEtiapine (SEROQUEL) 200 MG tablet Take 1 tablet (200 mg total) by mouth at bedtime. 30 tablet 5  . triamcinolone cream (KENALOG) 0.5 % Apply 1 application topically 2 (two) times daily. To affected areas. 30 g 3   No current facility-administered medications for this visit.    Allergies  Allergen Reactions  . Ketamine Anaphylaxis  . Oxycodone Diarrhea  . Papaya Enzyme Anaphylaxis      Discussed warning signs or symptoms. Please see discharge instructions. Patient expresses understanding.  I personally was present and performed or  re-performed the history, physical exam and medical decision-making activities of this service and have verified that the service and findings are accurately documented in the student's note. ___________________________________________ Lynne Leader M.D., ABFM., CAQSM. Primary Care and Sports Medicine Adjunct Instructor of [employer] [facility] [employer]

## 2019-06-12 ENCOUNTER — Telehealth: Payer: Self-pay

## 2019-06-12 ENCOUNTER — Other Ambulatory Visit: Payer: Self-pay | Admitting: Physician Assistant

## 2019-06-12 DIAGNOSIS — F411 Generalized anxiety disorder: Secondary | ICD-10-CM

## 2019-06-12 DIAGNOSIS — G35 Multiple sclerosis: Secondary | ICD-10-CM | POA: Diagnosis not present

## 2019-06-12 DIAGNOSIS — F430 Acute stress reaction: Secondary | ICD-10-CM

## 2019-06-12 NOTE — Telephone Encounter (Signed)
Last filled 05/16/19 #60 no refills.

## 2019-06-12 NOTE — Telephone Encounter (Signed)
Ok to refill today. Ok to call pharmacy and give a verbal. She needs to be reminded no more than twice a day use to last a full month.

## 2019-06-12 NOTE — Telephone Encounter (Signed)
Paige Lozano called and states she will need the Clonazepam refill today instead of the 5th of August. She states she has had a lot going on this month. Both her parents have had a stroke, she broke up with boy friend, she had to move and her friend passed away. Please advise.

## 2019-06-12 NOTE — Telephone Encounter (Signed)
Patient advised. Contacted pharmacy.

## 2019-06-21 ENCOUNTER — Other Ambulatory Visit (HOSPITAL_COMMUNITY): Payer: Self-pay

## 2019-06-21 MED ORDER — LAMOTRIGINE 150 MG PO TABS: 150.0000 mg | ORAL_TABLET | Freq: Two times a day (BID) | ORAL | 2 refills | Status: DC

## 2019-07-10 ENCOUNTER — Telehealth: Payer: Self-pay

## 2019-07-10 MED ORDER — FLUCONAZOLE 150 MG PO TABS: 150.0000 mg | ORAL_TABLET | Freq: Once | ORAL | 0 refills | Status: AC

## 2019-07-10 NOTE — Telephone Encounter (Signed)
Paige Lozano called and left a message stating she has a yeast infection. She is out of town at [facility]. She is requesting Diflucan to be sent to [facility] in [facility].

## 2019-07-10 NOTE — Telephone Encounter (Signed)
Left message advising of medication.  

## 2019-07-10 NOTE — Telephone Encounter (Signed)
Sent!

## 2019-07-14 ENCOUNTER — Other Ambulatory Visit: Payer: Self-pay

## 2019-07-14 ENCOUNTER — Encounter: Payer: Self-pay | Admitting: Sports Medicine

## 2019-07-14 ENCOUNTER — Ambulatory Visit (INDEPENDENT_AMBULATORY_CARE_PROVIDER_SITE_OTHER): Payer: Medicare Other | Admitting: Sports Medicine

## 2019-07-14 DIAGNOSIS — M25474 Effusion, right foot: Secondary | ICD-10-CM | POA: Diagnosis not present

## 2019-07-14 DIAGNOSIS — S92309A Fracture of unspecified metatarsal bone(s), unspecified foot, initial encounter for closed fracture: Secondary | ICD-10-CM | POA: Insufficient documentation

## 2019-07-14 NOTE — Assessment & Plan Note (Addendum)
Right second MTP injection. Unable to do arthrocentesis so there will be no crystal analysis, we are getting x-rays. Return in a month.

## 2019-07-14 NOTE — Progress Notes (Signed)
Subjective:    CC: Right foot pain  HPI: This is a very pleasant 53 year old female, she is had a lot of pain in her right foot, second MTP.  Significant swelling, no trauma, localized without radiation.  She did have plantar fascia injections with Dr. Georgina Snell which have been effective.  I reviewed the past medical history, family history, social history, surgical history, and allergies today and no changes were needed.  Please see the problem list section below in epic for further details.  Past Medical History: Past Medical History:  Diagnosis Date  . Anxiety   . Bipolar 2 disorder, major depressive episode ([place])   . Complication of anesthesia   . COPD (chronic obstructive pulmonary disease) ([place])    smoker  . Fibromyalgia   . Insomnia   . Multiple sclerosis ([place])   . PONV (postoperative nausea and vomiting)   . Pulmonary embolism ([place]) 2000  . Sleep apnea    mild OSA no CPAP  . Ulcerative colitis [facility])    Past Surgical History: Past Surgical History:  Procedure Laterality Date  . FOOT SURGERY Left    bone spur  . HEMORRHOID SURGERY    . HUMERUS FRACTURE SURGERY Left   . MYOMECTOMY    . NASAL SINUS SURGERY    . SHOULDER ARTHROSCOPY WITH SUBACROMIAL DECOMPRESSION AND BICEP TENDON REPAIR Left 07/27/2016   Procedure: SHOULDER ARTHROSCOPY DEBRIDEMENT ROTATOR CUFF AND LABRUM, SUBACROMIAL DECOMPRESSION, BICEPS TENODESIS;  Surgeon: Tania Ade, MD;  Location: [place];  Service: Orthopedics;  Laterality: Left;  SHOULDER ARTHROSCOPY DEBRIDEMENT ROTATOR CUFF AND LABRUM, SUBACROMIAL DECOMPRESSION, BICEPS TENOTOMY, POSSIBLE TENODESIS  . THUMB ARTHROSCOPY     5 surgeries on left thumb, 4 surgeries on right  . TONSILLECTOMY AND ADENOIDECTOMY     Social History: Social History   Socioeconomic History  . Marital status: Divorced    Spouse name: Not on file  . Number of children: Not on file  . Years of education: Not on file  . Highest education level: Not on  file  Occupational History  . Not on file  Social Needs  . Financial resource strain: Not on file  . Food insecurity    Worry: Not on file    Inability: Not on file  . Transportation needs    Medical: Not on file    Non-medical: Not on file  Tobacco Use  . Smoking status: Former Smoker    Packs/day: 0.25    Types: Cigarettes    Quit date: 07/08/2016    Years since quitting: 3.0  . Smokeless tobacco: Never Used  Substance and Sexual Activity  . Alcohol use: Yes    Alcohol/week: 0.0 standard drinks    Comment: rare   . Drug use: No    Types: Marijuana    Comment: last use 5 month ago  . Sexual activity: Yes    Partners: Male    Birth control/protection: None  Lifestyle  . Physical activity    Days per week: Not on file    Minutes per session: Not on file  . Stress: Not on file  Relationships  . Social Herbalist on phone: Not on file    Gets together: Not on file    Attends religious service: Not on file    Active member of club or organization: Not on file    Attends meetings of clubs or organizations: Not on file    Relationship status: Not on file  Other Topics Concern  .  Not on file  Social History Narrative  . Not on file   Family History: Family History  Adopted: Yes  Problem Relation Age of Onset  . Breast cancer Sister        Triple Negative   Allergies: Allergies  Allergen Reactions  . Ketamine Anaphylaxis  . Oxycodone Diarrhea  . Papaya Enzyme Anaphylaxis   Medications: See med rec.  Review of Systems: No fevers, chills, night sweats, weight loss, chest pain, or shortness of breath.   Objective:    General: Well Developed, well nourished, and in no acute distress.  Neuro: Alert and oriented x3, extra-ocular muscles intact, sensation grossly intact.  HEENT: Normocephalic, atraumatic, pupils equal round reactive to light, neck supple, no masses, no lymphadenopathy, thyroid nonpalpable.  Skin: Warm and dry, no rashes. Cardiac:  Regular rate and rhythm, no murmurs rubs or gallops, no lower extremity edema.  Respiratory: Clear to auscultation bilaterally. Not using accessory muscles, speaking in full sentences. Right foot: Swelling and tenderness at the second MTP Range of motion is full in all directions. Strength is 5/5 in all directions. No hallux valgus. No pes cavus or pes planus. No abnormal callus noted. No pain over the navicular prominence, or base of fifth metatarsal. No tenderness to palpation of the calcaneal insertion of plantar fascia. No pain at the Achilles insertion. No pain over the calcaneal bursa. No pain of the retrocalcaneal bursa. No hallux rigidus or limitus. No tenderness palpation over interphalangeal joints. No pain with compression of the metatarsal heads. Neurovascularly intact distally.  Procedure: Real-time Ultrasound Guided  attempted aspiration and injection of the right second MTP Device: GE Logiq E  Verbal informed consent obtained.  Time-out conducted.  Noted no overlying erythema, induration, or other signs of local infection.  Skin prepped in a sterile fashion.  Local anesthesia: Topical Ethyl chloride.  With sterile technique and under real time ultrasound guidance:  Noted synovitis and effusion in the second MTP however I was unable to aspirate any fluid with a 22-gauge needle, I was able to inject 1/2 cc Kenalog 40, 1/2 cc lidocaine Completed without difficulty  Pain immediately resolved suggesting accurate placement of the medication.  Advised to call if fevers/chills, erythema, induration, drainage, or persistent bleeding.  Images permanently stored and available for review in the ultrasound unit.  Impression: Technically successful ultrasound guided injection.  Impression and Recommendations:    Swelling of foot joint, right Right second MTP injection. Unable to do arthrocentesis so there will be no crystal analysis, we are getting x-rays. Return in a month.    ___________________________________________ Gwen Her. Dianah Field, M.D., ABFM., CAQSM. Primary Care and Sports Medicine [facility]  Adjunct Professor of [place] [employer] [facility] [employer]

## 2019-07-24 ENCOUNTER — Telehealth: Payer: Self-pay | Admitting: *Deleted

## 2019-07-24 DIAGNOSIS — M25474 Effusion, right foot: Secondary | ICD-10-CM

## 2019-07-24 DIAGNOSIS — G35 Multiple sclerosis: Secondary | ICD-10-CM | POA: Diagnosis not present

## 2019-07-24 NOTE — Telephone Encounter (Signed)
Pt called today complaining of excruciating foot pain, no better after injection if office.  MRI wo of right foot ordered.

## 2019-07-25 ENCOUNTER — Telehealth: Payer: Self-pay | Admitting: Sports Medicine

## 2019-07-25 ENCOUNTER — Other Ambulatory Visit: Payer: Self-pay

## 2019-07-25 ENCOUNTER — Ambulatory Visit (INDEPENDENT_AMBULATORY_CARE_PROVIDER_SITE_OTHER): Payer: Medicare Other

## 2019-07-25 DIAGNOSIS — S93621A Sprain of tarsometatarsal ligament of right foot, initial encounter: Secondary | ICD-10-CM | POA: Diagnosis not present

## 2019-07-25 DIAGNOSIS — S92334A Nondisplaced fracture of third metatarsal bone, right foot, initial encounter for closed fracture: Secondary | ICD-10-CM | POA: Diagnosis not present

## 2019-07-25 DIAGNOSIS — S92301A Fracture of unspecified metatarsal bone(s), right foot, initial encounter for closed fracture: Secondary | ICD-10-CM

## 2019-07-25 DIAGNOSIS — M25474 Effusion, right foot: Secondary | ICD-10-CM

## 2019-07-25 DIAGNOSIS — S92324A Nondisplaced fracture of second metatarsal bone, right foot, initial encounter for closed fracture: Secondary | ICD-10-CM | POA: Diagnosis not present

## 2019-07-25 MED ORDER — HYDROCODONE-ACETAMINOPHEN 10-325 MG PO TABS: 1.0000 | ORAL_TABLET | Freq: Three times a day (TID) | ORAL | 0 refills | Status: DC | PRN

## 2019-07-25 NOTE — Telephone Encounter (Signed)
Adding hydrocodone.  She is going to need to be nonweightbearing for at least 3 weeks with a cast.

## 2019-07-25 NOTE — Telephone Encounter (Signed)
Spoke with Pt, she needs something for pain. She does have to work today on her feet and the pain is unbearable.   She would like the Rx sent to [place], [place] [address]   Pt does not want a work note. There is not an option for no walking, and this job affects her disability check so she can't afford to be off.

## 2019-07-25 NOTE — Telephone Encounter (Signed)
Pt advised.

## 2019-07-25 NOTE — Assessment & Plan Note (Signed)
Fractures of the second and third metatarsal bases, she will need a cast for 6 to 8 weeks starting with 3 weeks of nonweightbearing. Adding hydrocodone for pain.

## 2019-07-26 ENCOUNTER — Encounter: Payer: Self-pay | Admitting: Sports Medicine

## 2019-07-26 ENCOUNTER — Ambulatory Visit (INDEPENDENT_AMBULATORY_CARE_PROVIDER_SITE_OTHER): Payer: Medicare Other | Admitting: Sports Medicine

## 2019-07-26 DIAGNOSIS — S92301A Fracture of unspecified metatarsal bone(s), right foot, initial encounter for closed fracture: Secondary | ICD-10-CM

## 2019-07-26 DIAGNOSIS — S92301D Fracture of unspecified metatarsal bone(s), right foot, subsequent encounter for fracture with routine healing: Secondary | ICD-10-CM | POA: Diagnosis not present

## 2019-07-26 MED ORDER — DOXYCYCLINE HYCLATE 100 MG PO TABS: 100.0000 mg | ORAL_TABLET | Freq: Two times a day (BID) | ORAL | 0 refills | Status: AC

## 2019-07-26 MED ORDER — HYDROCODONE-ACETAMINOPHEN 10-325 MG PO TABS: 1.0000 | ORAL_TABLET | Freq: Three times a day (TID) | ORAL | 0 refills | Status: DC | PRN

## 2019-07-26 NOTE — Progress Notes (Signed)
Subjective:    CC: Follow-up  HPI: Paige Lozano returns, she continues to have pain in her right foot.  I reviewed the past medical history, family history, social history, surgical history, and allergies today and no changes were needed.  Please see the problem list section below in epic for further details.  Past Medical History: Past Medical History:  Diagnosis Date  . Anxiety   . Bipolar 2 disorder, major depressive episode ([place])   . Complication of anesthesia   . COPD (chronic obstructive pulmonary disease) ([place])    smoker  . Fibromyalgia   . Insomnia   . Multiple sclerosis ([place])   . PONV (postoperative nausea and vomiting)   . Pulmonary embolism ([place]) 2000  . Sleep apnea    mild OSA no CPAP  . Ulcerative colitis [facility])    Past Surgical History: Past Surgical History:  Procedure Laterality Date  . FOOT SURGERY Left    bone spur  . HEMORRHOID SURGERY    . HUMERUS FRACTURE SURGERY Left   . MYOMECTOMY    . NASAL SINUS SURGERY    . SHOULDER ARTHROSCOPY WITH SUBACROMIAL DECOMPRESSION AND BICEP TENDON REPAIR Left 07/27/2016   Procedure: SHOULDER ARTHROSCOPY DEBRIDEMENT ROTATOR CUFF AND LABRUM, SUBACROMIAL DECOMPRESSION, BICEPS TENODESIS;  Surgeon: Tania Ade, MD;  Location: [place];  Service: Orthopedics;  Laterality: Left;  SHOULDER ARTHROSCOPY DEBRIDEMENT ROTATOR CUFF AND LABRUM, SUBACROMIAL DECOMPRESSION, BICEPS TENOTOMY, POSSIBLE TENODESIS  . THUMB ARTHROSCOPY     5 surgeries on left thumb, 4 surgeries on right  . TONSILLECTOMY AND ADENOIDECTOMY     Social History: Social History   Socioeconomic History  . Marital status: Divorced    Spouse name: Not on file  . Number of children: Not on file  . Years of education: Not on file  . Highest education level: Not on file  Occupational History  . Not on file  Social Needs  . Financial resource strain: Not on file  . Food insecurity    Worry: Not on file    Inability: Not on file  . Transportation  needs    Medical: Not on file    Non-medical: Not on file  Tobacco Use  . Smoking status: Former Smoker    Packs/day: 0.25    Types: Cigarettes    Quit date: 07/08/2016    Years since quitting: 3.0  . Smokeless tobacco: Never Used  Substance and Sexual Activity  . Alcohol use: Yes    Alcohol/week: 0.0 standard drinks    Comment: rare   . Drug use: No    Types: Marijuana    Comment: last use 5 month ago  . Sexual activity: Yes    Partners: Male    Birth control/protection: None  Lifestyle  . Physical activity    Days per week: Not on file    Minutes per session: Not on file  . Stress: Not on file  Relationships  . Social Herbalist on phone: Not on file    Gets together: Not on file    Attends religious service: Not on file    Active member of club or organization: Not on file    Attends meetings of clubs or organizations: Not on file    Relationship status: Not on file  Other Topics Concern  . Not on file  Social History Narrative  . Not on file   Family History: Family History  Adopted: Yes  Problem Relation Age of Onset  . Breast cancer Sister  Triple Negative   Allergies: Allergies  Allergen Reactions  . Ketamine Anaphylaxis  . Oxycodone Diarrhea  . Papaya Enzyme Anaphylaxis   Medications: See med rec.  Review of Systems: No fevers, chills, night sweats, weight loss, chest pain, or shortness of breath.   Objective:    General: Well Developed, well nourished, and in no acute distress.  Neuro: Alert and oriented x3, extra-ocular muscles intact, sensation grossly intact.  HEENT: Normocephalic, atraumatic, pupils equal round reactive to light, neck supple, no masses, no lymphadenopathy, thyroid nonpalpable.  Skin: Warm and dry, no rashes. Cardiac: Regular rate and rhythm, no murmurs rubs or gallops, no lower extremity edema.  Respiratory: Clear to auscultation bilaterally. Not using accessory muscles, speaking in full sentences. Right  foot: Swollen, tender at the second and third metatarsal shafts  Short leg cast placed.  Impression and Recommendations:    Fracture of second and third metatarsals of the right foot Cast placed today. We will do 2 weeks nonweightbearing, she will return and we will place her in a postop shoe if I see callus on her x-rays. Refilling hydrocodone, she did have a slight abrasion on her shin laterally, adding some doxycycline empirically.  I billed a fracture code for this encounter, all subsequent visits will be post-op checks in the global period.   ___________________________________________ Gwen Her. Dianah Field, M.D., ABFM., CAQSM. Primary Care and Sports Medicine [facility]  Adjunct Professor of [place] [employer] [facility] [employer]

## 2019-07-26 NOTE — Assessment & Plan Note (Signed)
Cast placed today. We will do 2 weeks nonweightbearing, she will return and we will place her in a postop shoe if I see callus on her x-rays. Refilling hydrocodone, she did have a slight abrasion on her shin laterally, adding some doxycycline empirically.  I billed a fracture code for this encounter, all subsequent visits will be post-op checks in the global period.

## 2019-07-31 ENCOUNTER — Ambulatory Visit (INDEPENDENT_AMBULATORY_CARE_PROVIDER_SITE_OTHER): Payer: Medicare Other

## 2019-07-31 ENCOUNTER — Other Ambulatory Visit: Payer: Self-pay | Admitting: Sports Medicine

## 2019-07-31 ENCOUNTER — Other Ambulatory Visit: Payer: Self-pay

## 2019-07-31 ENCOUNTER — Ambulatory Visit (INDEPENDENT_AMBULATORY_CARE_PROVIDER_SITE_OTHER): Payer: Medicare Other | Admitting: Sports Medicine

## 2019-07-31 DIAGNOSIS — S6992XA Unspecified injury of left wrist, hand and finger(s), initial encounter: Secondary | ICD-10-CM

## 2019-07-31 NOTE — Progress Notes (Signed)
Subjective:    CC: Left hand injury  HPI: This is a 53 year old female, she was going get the mail, crashed her knee scooter falling on her left hand, she has a history of a left first [place] fusion and MCP fusion, pain is at the IP joint of the thumb.  Bruising, swelling, moderate, persistent, localized without radiation.  I reviewed the past medical history, family history, social history, surgical history, and allergies today and no changes were needed.  Please see the problem list section below in epic for further details.  Past Medical History: Past Medical History:  Diagnosis Date  . Anxiety   . Bipolar 2 disorder, major depressive episode ([place])   . Complication of anesthesia   . COPD (chronic obstructive pulmonary disease) ([place])    smoker  . Fibromyalgia   . Insomnia   . Multiple sclerosis ([place])   . PONV (postoperative nausea and vomiting)   . Pulmonary embolism ([place]) 2000  . Sleep apnea    mild OSA no CPAP  . Ulcerative colitis [facility])    Past Surgical History: Past Surgical History:  Procedure Laterality Date  . FOOT SURGERY Left    bone spur  . HEMORRHOID SURGERY    . HUMERUS FRACTURE SURGERY Left   . MYOMECTOMY    . NASAL SINUS SURGERY    . SHOULDER ARTHROSCOPY WITH SUBACROMIAL DECOMPRESSION AND BICEP TENDON REPAIR Left 07/27/2016   Procedure: SHOULDER ARTHROSCOPY DEBRIDEMENT ROTATOR CUFF AND LABRUM, SUBACROMIAL DECOMPRESSION, BICEPS TENODESIS;  Surgeon: Tania Ade, MD;  Location: [place];  Service: Orthopedics;  Laterality: Left;  SHOULDER ARTHROSCOPY DEBRIDEMENT ROTATOR CUFF AND LABRUM, SUBACROMIAL DECOMPRESSION, BICEPS TENOTOMY, POSSIBLE TENODESIS  . THUMB ARTHROSCOPY     5 surgeries on left thumb, 4 surgeries on right  . TONSILLECTOMY AND ADENOIDECTOMY     Social History: Social History   Socioeconomic History  . Marital status: Divorced    Spouse name: Not on file  . Number of children: Not on file  . Years of education: Not on file   . Highest education level: Not on file  Occupational History  . Not on file  Social Needs  . Financial resource strain: Not on file  . Food insecurity    Worry: Not on file    Inability: Not on file  . Transportation needs    Medical: Not on file    Non-medical: Not on file  Tobacco Use  . Smoking status: Former Smoker    Packs/day: 0.25    Types: Cigarettes    Quit date: 07/08/2016    Years since quitting: 3.0  . Smokeless tobacco: Never Used  Substance and Sexual Activity  . Alcohol use: Yes    Alcohol/week: 0.0 standard drinks    Comment: rare   . Drug use: No    Types: Marijuana    Comment: last use 5 month ago  . Sexual activity: Yes    Partners: Male    Birth control/protection: None  Lifestyle  . Physical activity    Days per week: Not on file    Minutes per session: Not on file  . Stress: Not on file  Relationships  . Social Herbalist on phone: Not on file    Gets together: Not on file    Attends religious service: Not on file    Active member of club or organization: Not on file    Attends meetings of clubs or organizations: Not on file    Relationship status:  Not on file  Other Topics Concern  . Not on file  Social History Narrative  . Not on file   Family History: Family History  Adopted: Yes  Problem Relation Age of Onset  . Breast cancer Sister        Triple Negative   Allergies: Allergies  Allergen Reactions  . Ketamine Anaphylaxis  . Oxycodone Diarrhea  . Papaya Enzyme Anaphylaxis   Medications: See med rec.  Review of Systems: No fevers, chills, night sweats, weight loss, chest pain, or shortness of breath.   Objective:    General: Well Developed, well nourished, and in no acute distress.  Neuro: Alert and oriented x3, extra-ocular muscles intact, sensation grossly intact.  HEENT: Normocephalic, atraumatic, pupils equal round reactive to light, neck supple, no masses, no lymphadenopathy, thyroid nonpalpable.  Skin: Warm  and dry, no rashes. Cardiac: Regular rate and rhythm, no murmurs rubs or gallops, no lower extremity edema.  Respiratory: Clear to auscultation bilaterally. Not using accessory muscles, speaking in full sentences. Left hand: Bruised and swollen over the distal phalanx and the IP joint of the thumb.  X-rays show postsurgical changes but no obvious fractures.  Static dorsal extension splint placed over the thumb.  Impression and Recommendations:    Left wrist injury History of CMC and MCP fusions, pain is at the IP joint. There is a bit of swelling and bruising but x-rays are negative for acute fracture. I applied a dorsal extension splint, this will heal on its own.   ___________________________________________ Gwen Her. Dianah Field, M.D., ABFM., CAQSM. Primary Care and Sports Medicine [facility]  Adjunct Professor of [place] of [employer] [facility] of Medicine

## 2019-07-31 NOTE — Progress Notes (Signed)
[id number]

## 2019-07-31 NOTE — Assessment & Plan Note (Signed)
History of CMC and MCP fusions, pain is at the IP joint. There is a bit of swelling and bruising but x-rays are negative for acute fracture. I applied a dorsal extension splint, this will heal on its own.

## 2019-08-02 DIAGNOSIS — H25043 Posterior subcapsular polar age-related cataract, bilateral: Secondary | ICD-10-CM | POA: Diagnosis not present

## 2019-08-09 ENCOUNTER — Ambulatory Visit (INDEPENDENT_AMBULATORY_CARE_PROVIDER_SITE_OTHER): Payer: Medicare Other

## 2019-08-09 ENCOUNTER — Ambulatory Visit: Payer: Medicare Other | Admitting: Sports Medicine

## 2019-08-09 ENCOUNTER — Other Ambulatory Visit: Payer: Self-pay

## 2019-08-09 ENCOUNTER — Ambulatory Visit (INDEPENDENT_AMBULATORY_CARE_PROVIDER_SITE_OTHER): Payer: Medicare Other | Admitting: Sports Medicine

## 2019-08-09 DIAGNOSIS — S92301D Fracture of unspecified metatarsal bone(s), right foot, subsequent encounter for fracture with routine healing: Secondary | ICD-10-CM

## 2019-08-09 DIAGNOSIS — S92901D Unspecified fracture of right foot, subsequent encounter for fracture with routine healing: Secondary | ICD-10-CM | POA: Diagnosis not present

## 2019-08-09 DIAGNOSIS — S6992XD Unspecified injury of left wrist, hand and finger(s), subsequent encounter: Secondary | ICD-10-CM

## 2019-08-09 NOTE — Assessment & Plan Note (Signed)
History of CMC and MCP fusions, pain at the IP joint, this was after a fall, she has done well after 2 weeks in the dorsal extension splint and has no more discomfort.

## 2019-08-09 NOTE — Assessment & Plan Note (Signed)
Continue cast, postop shoe will be placed on the cast to allow her to walk with a cane. I really think maybe 2 more weeks in the cast and then we can remove it.

## 2019-08-09 NOTE — Progress Notes (Signed)
  Subjective: 2 weeks post cast immobilization of the right foot, second and third metatarsal fractures.  Doing well.  Objective: General: Well-developed, well-nourished, and in no acute distress. Right foot: Cast is in acceptable shape.  X-rays show stable alignment of the metatarsal fractures, no obvious bony callus seen yet.  Assessment/plan:   Fracture of second and third metatarsals of the right foot Continue cast, postop shoe will be placed on the cast to allow her to walk with a cane. I really think maybe 2 more weeks in the cast and then we can remove it.  Left wrist injury History of CMC and MCP fusions, pain at the IP joint, this was after a fall, she has done well after 2 weeks in the dorsal extension splint and has no more discomfort.    ___________________________________________ Gwen Her. Dianah Field, M.D., ABFM., CAQSM. Primary Care and Sports Medicine [facility]  Adjunct Professor of [place] of [facility] of Medicine

## 2019-08-11 ENCOUNTER — Ambulatory Visit: Payer: Medicare Other | Admitting: Sports Medicine

## 2019-08-15 DIAGNOSIS — Z23 Encounter for immunization: Secondary | ICD-10-CM | POA: Diagnosis not present

## 2019-08-22 DIAGNOSIS — H04123 Dry eye syndrome of bilateral lacrimal glands: Secondary | ICD-10-CM | POA: Diagnosis not present

## 2019-08-22 DIAGNOSIS — H25042 Posterior subcapsular polar age-related cataract, left eye: Secondary | ICD-10-CM | POA: Diagnosis not present

## 2019-08-22 DIAGNOSIS — H25043 Posterior subcapsular polar age-related cataract, bilateral: Secondary | ICD-10-CM | POA: Diagnosis not present

## 2019-08-23 ENCOUNTER — Ambulatory Visit (INDEPENDENT_AMBULATORY_CARE_PROVIDER_SITE_OTHER): Payer: Medicare Other

## 2019-08-23 ENCOUNTER — Ambulatory Visit (INDEPENDENT_AMBULATORY_CARE_PROVIDER_SITE_OTHER): Payer: Medicare Other | Admitting: Sports Medicine

## 2019-08-23 ENCOUNTER — Encounter (HOSPITAL_COMMUNITY): Payer: Self-pay | Admitting: Psychiatry

## 2019-08-23 ENCOUNTER — Ambulatory Visit (INDEPENDENT_AMBULATORY_CARE_PROVIDER_SITE_OTHER): Payer: Medicare Other | Admitting: Psychiatry

## 2019-08-23 ENCOUNTER — Other Ambulatory Visit: Payer: Self-pay

## 2019-08-23 DIAGNOSIS — X58XXXD Exposure to other specified factors, subsequent encounter: Secondary | ICD-10-CM

## 2019-08-23 DIAGNOSIS — S92321D Displaced fracture of second metatarsal bone, right foot, subsequent encounter for fracture with routine healing: Secondary | ICD-10-CM | POA: Diagnosis not present

## 2019-08-23 DIAGNOSIS — F4310 Post-traumatic stress disorder, unspecified: Secondary | ICD-10-CM

## 2019-08-23 DIAGNOSIS — F3181 Bipolar II disorder: Secondary | ICD-10-CM | POA: Diagnosis not present

## 2019-08-23 DIAGNOSIS — S92301D Fracture of unspecified metatarsal bone(s), right foot, subsequent encounter for fracture with routine healing: Secondary | ICD-10-CM

## 2019-08-23 DIAGNOSIS — S92331D Displaced fracture of third metatarsal bone, right foot, subsequent encounter for fracture with routine healing: Secondary | ICD-10-CM | POA: Diagnosis not present

## 2019-08-23 DIAGNOSIS — F411 Generalized anxiety disorder: Secondary | ICD-10-CM | POA: Diagnosis not present

## 2019-08-23 NOTE — Progress Notes (Signed)
[facility] Outpatient Follow up visit   Patient Identification: Paige Lozano MRN:  [id number] Date of Evaluation:  08/23/2019 Referral Source: Luvenia Starch Primary care Chief Complaint:   Bipolar follow up  Visit Diagnosis:    ICD-10-CM   1. Bipolar 2 disorder (HCC)  F31.81   2. PTSD (post-traumatic stress disorder)  F43.10   3. GAD (generalized anxiety disorder)  F41.1     I connected with Paige Lozano on 08/23/19 at  1:30 PM EDT by telephone and verified that I am speaking with the correct person using two identifiers.   I discussed the limitations of evaluation and management by telemedicine and the availability of in person appointments. The patient expressed understanding and agreed to proceed.  History of Present Illness:  53  years old Returns for follow-up and medication management  Had breakup with BF and lost job, stressors were high but getting better, went to depression and then hyped mood with decreased sleep [place] , has some support On MS meds lamictal has kept balance and seroquel for sleep and bipolar Feels meds are at right dose  No rash Primary care has started Seroquel.  Says it was for insomnia   Aggravating factor: surgeries . Multiple medical .surgeries Modifying factor: dogs  Duration more then 10 years     Previous Psychotropic Medications: Yes   Substance Abuse History in the last 12 months:  Yes.   as per history Marijuana says it is medical for her condition.  Consequences of Substance Abuse: Medical Consequences:  fatigue, poor concenctration  Past Medical History:  Past Medical History:  Diagnosis Date  . Anxiety   . Bipolar 2 disorder, major depressive episode ([place])   . Complication of anesthesia   . COPD (chronic obstructive pulmonary disease) ([place])    smoker  . Fibromyalgia   . Insomnia   . Multiple sclerosis ([place])   . PONV (postoperative nausea and vomiting)   . Pulmonary embolism ([place]) 2000  .  Sleep apnea    mild OSA no CPAP  . Ulcerative colitis [facility])     Past Surgical History:  Procedure Laterality Date  . FOOT SURGERY Left    bone spur  . HEMORRHOID SURGERY    . HUMERUS FRACTURE SURGERY Left   . MYOMECTOMY    . NASAL SINUS SURGERY    . SHOULDER ARTHROSCOPY WITH SUBACROMIAL DECOMPRESSION AND BICEP TENDON REPAIR Left 07/27/2016   Procedure: SHOULDER ARTHROSCOPY DEBRIDEMENT ROTATOR CUFF AND LABRUM, SUBACROMIAL DECOMPRESSION, BICEPS TENODESIS;  Surgeon: Tania Ade, MD;  Location: [place];  Service: Orthopedics;  Laterality: Left;  SHOULDER ARTHROSCOPY DEBRIDEMENT ROTATOR CUFF AND LABRUM, SUBACROMIAL DECOMPRESSION, BICEPS TENOTOMY, POSSIBLE TENODESIS  . THUMB ARTHROSCOPY     5 surgeries on left thumb, 4 surgeries on right  . TONSILLECTOMY AND ADENOIDECTOMY       Family History:  Family History  Adopted: Yes  Problem Relation Age of Onset  . Breast cancer Sister        Triple Negative    Social History:   Social History   Socioeconomic History  . Marital status: Divorced    Spouse name: Not on file  . Number of children: Not on file  . Years of education: Not on file  . Highest education level: Not on file  Occupational History  . Not on file  Social Needs  . Financial resource strain: Not on file  . Food insecurity    Worry: Not on  file    Inability: Not on file  . Transportation needs    Medical: Not on file    Non-medical: Not on file  Tobacco Use  . Smoking status: Former Smoker    Packs/day: 0.25    Types: Cigarettes    Quit date: 07/08/2016    Years since quitting: 3.1  . Smokeless tobacco: Never Used  Substance and Sexual Activity  . Alcohol use: Yes    Alcohol/week: 0.0 standard drinks    Comment: rare   . Drug use: No    Types: Marijuana    Comment: last use 5 month ago  . Sexual activity: Yes    Partners: Male    Birth control/protection: None  Lifestyle  . Physical activity    Days per week: Not on file     Minutes per session: Not on file  . Stress: Not on file  Relationships  . Social Herbalist on phone: Not on file    Gets together: Not on file    Attends religious service: Not on file    Active member of club or organization: Not on file    Attends meetings of clubs or organizations: Not on file    Relationship status: Not on file  Other Topics Concern  . Not on file  Social History Narrative  . Not on file     Allergies:   Allergies  Allergen Reactions  . Ketamine Anaphylaxis  . Oxycodone Diarrhea  . Papaya Enzyme Anaphylaxis    Metabolic Disorder Labs: No results found for: HGBA1C, MPG No results found for: PROLACTIN No results found for: CHOL, TRIG, HDL, CHOLHDL, VLDL, LDLCALC   Current Medications: Current Outpatient Medications  Medication Sig Dispense Refill  . acetaminophen (TYLENOL) 650 MG CR tablet Take 1 tablet (650 mg total) by mouth every 8 (eight) hours as needed for pain. 90 tablet 3  . albuterol (PROVENTIL HFA;VENTOLIN HFA) 108 (90 Base) MCG/ACT inhaler Inhale 2 puffs into the lungs every 6 (six) hours as needed for wheezing or shortness of breath. 1 Inhaler 0  . balsalazide (COLAZAL) 750 MG capsule Take 3 capsules (2,250 mg total) by mouth 3 (three) times daily. 270 capsule 1  . celecoxib (CELEBREX) 200 MG capsule One to 2 tablets by mouth daily as needed for pain. 60 capsule 2  . Cholecalciferol (VITAMIN D-1000 MAX ST) 1000 units tablet Take by mouth.    . clonazePAM (KLONOPIN) 0.5 MG tablet TAKE ONE TABLET BY MOUTH TWICE A DAY AS NEEDED 60 tablet 2  . fexofenadine (ALLEGRA ALLERGY) 180 MG tablet Take 1 tablet (180 mg total) by mouth daily. 90 tablet 4  . fluticasone (FLONASE) 50 MCG/ACT nasal spray Place 2 sprays into both nostrils daily. 16 g 3  . folic acid (FOLVITE) 1 MG tablet Take 1 mg by mouth daily.    Marland Kitchen HYDROcodone-acetaminophen (NORCO) 10-325 MG tablet Take 1 tablet by mouth every 8 (eight) hours as needed. 30 tablet 0  . hydrOXYzine  (VISTARIL) 25 MG capsule Take 1 capsule (25 mg total) by mouth 3 (three) times daily as needed. 90 capsule 1  . lamoTRIgine (LAMICTAL) 150 MG tablet Take 1 tablet (150 mg total) by mouth 2 (two) times daily. 60 tablet 2  . methocarbamol (ROBAXIN) 500 MG tablet Take 1 tablet (500 mg total) by mouth 3 (three) times daily. 90 tablet 0  . Multiple Vitamin (MULTI-VITAMINS) TABS Take by mouth.    . QUEtiapine (SEROQUEL) 200 MG tablet Take 1 tablet (  200 mg total) by mouth at bedtime. 30 tablet 5  . triamcinolone cream (KENALOG) 0.5 % Apply 1 application topically 2 (two) times daily. To affected areas. 30 g 3   No current facility-administered medications for this visit.       Psychiatric Specialty Exam: Review of Systems  Cardiovascular: Negative for chest pain and palpitations.  Musculoskeletal: Positive for myalgias.  Skin: Negative for rash.  Neurological: Negative for tremors.  Psychiatric/Behavioral: Negative for suicidal ideas.    There were no vitals taken for this visit.There is no height or weight on file to calculate BMI.  General Appearance: Casual  Eye Contact:  Fair  Speech:  Normal Rate  Volume:  normal  Mood:fair  Affect:  congruent  Thought Process:  Goal Directed  Orientation:  Full (Time, Place, and Person)  Thought Content:  Logical  Suicidal Thoughts:  No  Homicidal Thoughts:  No  Memory:  Immediate;   Fair Recent;   Fair  Judgement:  Fair  Insight:  Fair  Psychomotor Activity:  Normal  Concentration:  Concentration: Fair and Attention Span: Fair  Recall:  [employer] of Knowledge:Fair  Language: Fair  Akathisia:  Negative  Handed:  Right  AIMS (if indicated):    Assets:  Desire for Improvement  ADL's:  Intact  Cognition: WNL  Sleep:  Fair while on meds    Treatment Plan Summary: Medication management and Plan as follows  Bipolar 2: had been depressed last month but now better,  lamictal now at 300mg . W She is planning to move but will keep  appointments here for  Now or call for refill   GAD: balanced on prn klonopine Consider therapy if needed   Insomnia: fluctuates. Reviewed sleep hygiene. Getting seroquel from primary care  avoid phenteramine or stimulants FU 38m or earlier if needed I discussed the assessment and treatment plan with the patient. The patient was provided an opportunity to ask questions and all were answered. The patient agreed with the plan and demonstrated an understanding of the instructions.   The patient was advised to call back or seek an in-person evaluation if the symptoms worsen or if the condition fails to improve as anticipated. Fu 43m.   Merian Capron, MD 10/14/20201:53 PM

## 2019-08-23 NOTE — Assessment & Plan Note (Signed)
Noted approximately 5 weeks ago, has been in a cast, transitioning to a postop shoe. X-rays today. Return in 1 month.

## 2019-08-23 NOTE — Progress Notes (Signed)
  Subjective: Now approximately 5 weeks post second and third metatarsal shaft fractures, in a cast.  Objective: General: Well-developed, well-nourished, and in no acute distress. Right foot: Cast is removed, still with minimal tenderness over the fractures.  Postop shoe applied.  Assessment/plan:   Fracture of second and third metatarsals of the right foot Noted approximately 5 weeks ago, has been in a cast, transitioning to a postop shoe. X-rays today. Return in 1 month.   ___________________________________________ Gwen Her. Dianah Field, M.D., ABFM., CAQSM. Primary Care and Sports Medicine [facility]  Adjunct Professor of [place] of [facility] of Medicine

## 2019-08-25 ENCOUNTER — Encounter: Payer: Self-pay | Admitting: Physician Assistant

## 2019-08-25 ENCOUNTER — Other Ambulatory Visit: Payer: Self-pay

## 2019-08-25 ENCOUNTER — Ambulatory Visit (INDEPENDENT_AMBULATORY_CARE_PROVIDER_SITE_OTHER): Payer: Medicare Other | Admitting: Physician Assistant

## 2019-08-25 VITALS — BP 110/81 | HR 88 | Ht 68.5 in | Wt 167.0 lb

## 2019-08-25 DIAGNOSIS — Z1329 Encounter for screening for other suspected endocrine disorder: Secondary | ICD-10-CM

## 2019-08-25 DIAGNOSIS — L570 Actinic keratosis: Secondary | ICD-10-CM | POA: Diagnosis not present

## 2019-08-25 DIAGNOSIS — Z8781 Personal history of (healed) traumatic fracture: Secondary | ICD-10-CM

## 2019-08-25 DIAGNOSIS — Z13820 Encounter for screening for osteoporosis: Secondary | ICD-10-CM

## 2019-08-25 DIAGNOSIS — Z13220 Encounter for screening for lipoid disorders: Secondary | ICD-10-CM

## 2019-08-25 DIAGNOSIS — T380X5S Adverse effect of glucocorticoids and synthetic analogues, sequela: Secondary | ICD-10-CM

## 2019-08-25 DIAGNOSIS — F5101 Primary insomnia: Secondary | ICD-10-CM | POA: Diagnosis not present

## 2019-08-25 DIAGNOSIS — G35 Multiple sclerosis: Secondary | ICD-10-CM

## 2019-08-25 DIAGNOSIS — G35D Multiple sclerosis, unspecified: Secondary | ICD-10-CM

## 2019-08-25 DIAGNOSIS — Z780 Asymptomatic menopausal state: Secondary | ICD-10-CM | POA: Diagnosis not present

## 2019-08-25 DIAGNOSIS — F3181 Bipolar II disorder: Secondary | ICD-10-CM

## 2019-08-25 DIAGNOSIS — Z131 Encounter for screening for diabetes mellitus: Secondary | ICD-10-CM | POA: Diagnosis not present

## 2019-08-25 DIAGNOSIS — Z01818 Encounter for other preprocedural examination: Secondary | ICD-10-CM | POA: Diagnosis not present

## 2019-08-25 MED ORDER — QUETIAPINE FUMARATE 200 MG PO TABS: 200.0000 mg | ORAL_TABLET | Freq: Every day | ORAL | 5 refills | Status: DC

## 2019-08-25 NOTE — Progress Notes (Signed)
Subjective:    Patient ID: Paige Lozano, female    DOB: [birth year], 53 y.o.   MRN: [id number]  HPI  Pt is a 53 yo female with bipolar 2, insomnia, anxiety, fibromyalgia, ulcerative colitis, MS who presents to the clinic to follow up [place] She plans to keep most of her doctors here for as long as she can. She will be coming back here frequently.   She is doing better. She is excited about the move to start over a little bit. She recently has gone through a bad break up and lost her job.    Pt is sleeping well on seroquel. No problems or concerns.   .. Active Ambulatory Problems    Diagnosis Date Noted  . Multiple sclerosis ([place]) 05/29/2016  . Fibromyalgia 05/29/2016  . 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 Insomnia 05/29/2016  . Ulcerative pancolitis without complication ([place]) [id number]  . Bipolar 2 disorder ([place]) 05/29/2016  . Current smoker 05/29/2016  . Primary osteoarthritis of right knee with recent ACL tear and femoral condylar impaction fracture 06/01/2016  . Anxiety state 06/01/2016  . Left shoulder pain 06/01/2016  . GAD (generalized anxiety disorder) 07/20/2016  . History of pulmonary embolus (PE) 07/21/2016  . Right elbow pain 07/21/2016  . Hot flashes 12/29/2016  . Body aches 12/29/2016  . Pruritus 12/29/2016  . Palpitations 03/08/2017  . Chest tightness 03/08/2017  . Tachycardia 03/08/2017  . Sinus tachycardia 03/12/2017  . PAC (premature atrial contraction) 03/12/2017  . Lumbar degenerative disc disease 06/02/2017  . Plantar fasciitis, bilateral 10/29/2017  . Onychomycosis 02/17/2018  . 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 Subcutaneous mass 04/07/2018  . Family history of breast cancer 12/02/2018  . Acute stress reaction 03/13/2019  . Fracture of second and third metatarsals of the right foot 07/14/2019  . Left wrist injury 07/31/2019  . Thyroid disorder screen 09/04/2019   Resolved Ambulatory Problems    Diagnosis Date Noted  .  Right lateral epicondylitis 07/21/2016  . Sinusitis 11/30/2016  . Right wrist injury 12/29/2016  . Hand injury, left, subsequent encounter 12/30/2016  . Chills (without fever) 03/08/2017   Past Medical History:  Diagnosis Date  . Anxiety   . Bipolar 2 disorder, major depressive episode ([place])   . Complication of anesthesia   . COPD (chronic obstructive pulmonary disease) ([place])   . PONV (postoperative nausea and vomiting)   . Pulmonary embolism ([place]) 2000  . Sleep apnea   . Ulcerative colitis ([place])     Review of Systems  All other systems reviewed and are negative.      Objective:   Physical Exam Vitals signs reviewed.  Constitutional:      Appearance: Normal appearance.  Cardiovascular:     Rate and Rhythm: Normal rate and regular rhythm.  Pulmonary:     Effort: Pulmonary effort is normal.     Breath sounds: Normal breath sounds.  Skin:    Comments: Left forehead scalp line scaly lesion on erythematous base.   Neurological:     General: No focal deficit present.     Mental Status: She is alert and oriented to person, place, and time.  Psychiatric:        Mood and Affect: Mood normal.        Behavior: Behavior normal.          Assessment & Plan:  Marland KitchenMarland KitchenCelissa was seen today for follow-up.  Diagnoses and all orders for this visit:  Primary insomnia -  QUEtiapine (SEROQUEL) 200 MG tablet; Take 1 tablet (200 mg total) by mouth at bedtime.  Multiple sclerosis ([place])  Screening for lipid disorders -     Lipid Panel w/reflex Direct LDL  Screening for diabetes mellitus -     COMPLETE METABOLIC PANEL WITH GFR  Thyroid disorder screen -     TSH  Bipolar 2 disorder (HCC)   The only thing psych does not prescribe is Seroquel. Pt is sleeping well.  Refilled that today.  She also uses klonapin as needed. Limit this usage. Now that she is doing better after break up hopefully that will continue to decline in usage. Pt aware of dependency risk.   Bone density  ordered. Lab ordered.  Pt aware she needs pap.   Cryotherapy Procedure Note  Pre-operative Diagnosis: Actinic keratosis  Post-operative Diagnosis: Actinic keratosis  Locations: top left forehead scalp line.   Indications: precancerous  Procedure Details  History of allergy to iodine: no. Pacemaker? no.  Patient informed of risks (permanent scarring, infection, light or dark discoloration, bleeding, infection, weakness, numbness and recurrence of the lesion) and benefits of the procedure and verbal informed consent obtained.  The areas are treated with liquid nitrogen therapy, frozen until ice ball extended 2 mm beyond lesion, allowed to thaw, and treated again. The patient tolerated procedure well.  The patient was instructed on post-op care, warned that there may be blister formation, redness and pain. Recommend OTC analgesia as needed for pain.  Condition: Stable  Complications: none.  Plan: 1. Instructed to keep the area dry and covered for 24-48h and clean thereafter. 2. Warning signs of infection were reviewed.   3. Recommended that the patient use OTC acetaminophen as needed for pain.

## 2019-08-25 NOTE — Patient Instructions (Signed)
Actinic Keratosis An actinic keratosis is a precancerous growth on the skin. If there is more than one growth, the condition is called actinic keratoses. Actinic keratoses appear most often on areas of skin that get a lot of sun exposure, including the scalp, face, ears, lips, upper back, forearms, and the backs of the hands. If left untreated, these growths may develop into a skin cancer called squamous cell carcinoma. It is important to have all these growths checked by a health care provider to determine the best treatment approach. What are the causes? Actinic keratoses are caused by getting too much ultraviolet (UV) radiation from the sun or other UV light sources. What increases the risk? You are more likely to develop this condition if you:  Have light-colored skin and blue eyes.  Have blond or red hair.  Spend a lot of time in the sun.  Do not protect your skin from the sun when outdoors.  Are an older person. The risk of developing an actinic keratosis increases with age. What are the signs or symptoms? Actinic keratoses feel like scaly, rough spots of skin. Symptoms of this condition include growths that may:  Be as small as a pinhead or as big as a quarter.  Itch, hurt, or feel sensitive.  Be skin-colored, light tan, dark tan, pink, or a combination of any of these colors. In most cases, the growths become red.  Have a small piece of pink or gray skin (skin tag) growing from them. It may be easier to notice actinic keratoses by feeling them, rather than seeing them. Sometimes, actinic keratoses disappear, but many reappear a few days to a few weeks later. How is this diagnosed? This condition is usually diagnosed with a physical exam.  A tissue sample may be removed from the actinic keratosis and examined under a microscope (biopsy). How is this treated? If needed, this condition may be treated by:  Scraping off the actinic keratosis (curettage).  Freezing the actinic  keratosis with liquid nitrogen (cryosurgery). This causes the growth to eventually fall off the skin.  Applying medicated creams or gels to destroy the cells in the growth.  Applying chemicals to the actinic keratosis to make the outer layers of skin peel off (chemical peel).  Using photodynamic therapy. In this procedure, medicated cream is applied to the actinic keratosis. This cream increases your skin's sensitivity to light. Then, a strong light is aimed at the actinic keratosis to destroy cells in the growth. Follow these instructions at home: Skin care  Apply cool, wet cloths (cool compresses) to the affected areas.  Do not scratch your skin.  Check your skin regularly for any growths, especially growths that: ? Start to itch or bleed. ? Change in size, shape, or color. Caring for the treated area  Keep the treated area clean and dry as told by your health care provider.  Do not apply any medicine, cream, or lotion to the treated area unless your health care provider tells you to do that.  Do not pick at blisters or try to break them open. This can cause infection and scarring.  If you have red or irritated skin after treatment, follow instructions from your health care provider about how to take care of the treated area. Make sure you: ? Wash your hands with soap and water before you change your bandage (dressing). If soap and water are not available, use hand sanitizer. ? Change your dressing as told by your health care provider.  If  you have red or irritated skin after treatment, check your treated area every day for signs of infection. Check for: ? Redness, swelling, or pain. ? Fluid or blood. ? Warmth. ? Pus or a bad smell. General instructions  Take or apply over-the-counter and prescription medicines only as told by your health care provider.  Return to your normal activities as told by your health care provider. Ask your health care provider what activities are  safe for you.  Have a skin exam done every year by a health care provider who is a skin specialist (dermatologist).  Keep all follow-up visits as told by your health care provider. This is important. Lifestyle  Do not use any products that contain nicotine or tobacco, such as cigarettes and e-cigarettes. If you need help quitting, ask your health care provider.  Take steps to protect your skin from the sun. ? Try to avoid the sun between 10:00 a.m. and 4:00 p.m. This is when the UV light is the strongest. ? Use a sunscreen or sunblock with SPF 30 (sun protection factor 30) or greater. ? Apply sunscreen before you are exposed to sunlight and reapply as often as directed by the instructions on the sunscreen container. ? Always wear sunglasses that have UV protection, and always wear a hat and clothing to protect your skin from sunlight. ? When possible, avoid medicines that increase your sensitivity to sunlight. ? Do not use tanning beds or other indoor tanning devices. Contact a health care provider if:  You notice any changes or new growths on your skin.  You have swelling, pain, or more redness around your treated area.  You have fluid or blood coming from your treated area.  Your treated area feels warm to the touch.  You have pus or a bad smell coming from your treated area.  You have a fever.  You have a blister that becomes large and painful. Summary  An actinic keratosis is a precancerous growth on the skin. If there is more than one growth, the condition is called actinic keratoses. In some cases, if left untreated, these growths can develop into skin cancer.  Check your skin regularly for any growths, especially growths that start to itch or bleed, or change in size, shape, or color.  Take steps to protect your skin from the sun.  Contact a health care provider if you notice any changes or new growths on your skin.  Keep all follow-up visits as told by your health  care provider. This is important. This information is not intended to replace advice given to you by your health care provider. Make sure you discuss any questions you have with your health care provider. Document Released: 01/22/2009 Document Revised: 03/08/2018 Document Reviewed: 03/08/2018 Elsevier Patient Education  2020 Elsevier Inc.  

## 2019-08-28 DIAGNOSIS — H25012 Cortical age-related cataract, left eye: Secondary | ICD-10-CM | POA: Diagnosis not present

## 2019-08-28 DIAGNOSIS — F319 Bipolar disorder, unspecified: Secondary | ICD-10-CM | POA: Diagnosis not present

## 2019-08-28 DIAGNOSIS — Z87891 Personal history of nicotine dependence: Secondary | ICD-10-CM | POA: Diagnosis not present

## 2019-08-28 DIAGNOSIS — Z79899 Other long term (current) drug therapy: Secondary | ICD-10-CM | POA: Diagnosis not present

## 2019-08-28 DIAGNOSIS — H25011 Cortical age-related cataract, right eye: Secondary | ICD-10-CM | POA: Diagnosis not present

## 2019-08-28 DIAGNOSIS — H2512 Age-related nuclear cataract, left eye: Secondary | ICD-10-CM | POA: Diagnosis not present

## 2019-08-28 DIAGNOSIS — H25042 Posterior subcapsular polar age-related cataract, left eye: Secondary | ICD-10-CM | POA: Diagnosis not present

## 2019-08-28 DIAGNOSIS — G35 Multiple sclerosis: Secondary | ICD-10-CM | POA: Diagnosis not present

## 2019-08-29 DIAGNOSIS — K5100 Ulcerative (chronic) pancolitis without complications: Secondary | ICD-10-CM | POA: Diagnosis not present

## 2019-09-01 DIAGNOSIS — G35 Multiple sclerosis: Secondary | ICD-10-CM | POA: Diagnosis not present

## 2019-09-04 ENCOUNTER — Encounter: Payer: Self-pay | Admitting: Physician Assistant

## 2019-09-04 DIAGNOSIS — Z1329 Encounter for screening for other suspected endocrine disorder: Secondary | ICD-10-CM | POA: Insufficient documentation

## 2019-09-04 DIAGNOSIS — L570 Actinic keratosis: Secondary | ICD-10-CM | POA: Insufficient documentation

## 2019-09-07 DIAGNOSIS — R5383 Other fatigue: Secondary | ICD-10-CM | POA: Diagnosis not present

## 2019-09-07 DIAGNOSIS — R200 Anesthesia of skin: Secondary | ICD-10-CM | POA: Diagnosis not present

## 2019-09-07 DIAGNOSIS — G35 Multiple sclerosis: Secondary | ICD-10-CM | POA: Diagnosis not present

## 2019-09-07 DIAGNOSIS — R413 Other amnesia: Secondary | ICD-10-CM | POA: Diagnosis not present

## 2019-09-16 DIAGNOSIS — Z01818 Encounter for other preprocedural examination: Secondary | ICD-10-CM | POA: Diagnosis not present

## 2019-09-18 DIAGNOSIS — G4730 Sleep apnea, unspecified: Secondary | ICD-10-CM | POA: Diagnosis not present

## 2019-09-18 DIAGNOSIS — H25011 Cortical age-related cataract, right eye: Secondary | ICD-10-CM | POA: Diagnosis not present

## 2019-09-18 DIAGNOSIS — H2511 Age-related nuclear cataract, right eye: Secondary | ICD-10-CM | POA: Diagnosis not present

## 2019-09-18 DIAGNOSIS — M1990 Unspecified osteoarthritis, unspecified site: Secondary | ICD-10-CM | POA: Diagnosis not present

## 2019-09-18 DIAGNOSIS — F419 Anxiety disorder, unspecified: Secondary | ICD-10-CM | POA: Diagnosis not present

## 2019-09-18 DIAGNOSIS — H04123 Dry eye syndrome of bilateral lacrimal glands: Secondary | ICD-10-CM | POA: Diagnosis not present

## 2019-09-18 DIAGNOSIS — Z79899 Other long term (current) drug therapy: Secondary | ICD-10-CM | POA: Diagnosis not present

## 2019-09-18 DIAGNOSIS — M5136 Other intervertebral disc degeneration, lumbar region: Secondary | ICD-10-CM | POA: Diagnosis not present

## 2019-09-18 DIAGNOSIS — G35 Multiple sclerosis: Secondary | ICD-10-CM | POA: Diagnosis not present

## 2019-09-18 DIAGNOSIS — K219 Gastro-esophageal reflux disease without esophagitis: Secondary | ICD-10-CM | POA: Diagnosis not present

## 2019-09-18 DIAGNOSIS — H25041 Posterior subcapsular polar age-related cataract, right eye: Secondary | ICD-10-CM | POA: Diagnosis not present

## 2019-09-19 ENCOUNTER — Encounter: Payer: Self-pay | Admitting: Sports Medicine

## 2019-09-19 ENCOUNTER — Ambulatory Visit (INDEPENDENT_AMBULATORY_CARE_PROVIDER_SITE_OTHER): Payer: Medicare Other

## 2019-09-19 ENCOUNTER — Ambulatory Visit (INDEPENDENT_AMBULATORY_CARE_PROVIDER_SITE_OTHER): Payer: Medicare Other | Admitting: Sports Medicine

## 2019-09-19 ENCOUNTER — Other Ambulatory Visit: Payer: Self-pay

## 2019-09-19 DIAGNOSIS — S92301G Fracture of unspecified metatarsal bone(s), right foot, subsequent encounter for fracture with delayed healing: Secondary | ICD-10-CM

## 2019-09-19 DIAGNOSIS — S92901G Unspecified fracture of right foot, subsequent encounter for fracture with delayed healing: Secondary | ICD-10-CM

## 2019-09-19 DIAGNOSIS — S92321K Displaced fracture of second metatarsal bone, right foot, subsequent encounter for fracture with nonunion: Secondary | ICD-10-CM | POA: Diagnosis not present

## 2019-09-19 DIAGNOSIS — S92301D Fracture of unspecified metatarsal bone(s), right foot, subsequent encounter for fracture with routine healing: Secondary | ICD-10-CM

## 2019-09-19 DIAGNOSIS — S92301A Fracture of unspecified metatarsal bone(s), right foot, initial encounter for closed fracture: Secondary | ICD-10-CM

## 2019-09-19 MED ORDER — HYDROCODONE-ACETAMINOPHEN 10-325 MG PO TABS: 1.0000 | ORAL_TABLET | Freq: Three times a day (TID) | ORAL | 0 refills | Status: DC | PRN

## 2019-09-19 NOTE — Progress Notes (Addendum)
  Subjective: 9 weeks post fracture, had improvement, followed by worsening of pain.  Xrays haven't been helpful.   Objective: General: Well-developed, well-nourished, and in no acute distress. Right foot: No visible erythema or swelling. Range of motion is full in all directions. Strength is 5/5 in all directions. No hallux valgus. No pes cavus or pes planus. No abnormal callus noted. No pain over the navicular prominence, or base of fifth metatarsal. No tenderness to palpation of the calcaneal insertion of plantar fascia. No pain at the Achilles insertion. No pain over the calcaneal bursa. No pain of the retrocalcaneal bursa. Tender palpation on the second and third metatarsal shafts No hallux rigidus or limitus. No tenderness palpation over interphalangeal joints. No pain with compression of the metatarsal heads. Neurovascularly intact distally.  Assessment/plan:   Fracture of second and third metatarsals of the right foot Improved but now having worsening pain over the second and third metatarsal shafts, she did have a fracture, now 9 weeks out. I am concerned about nonunion, CT of the foot right now. Transition back into the boot. If we do see a nonunion or malunion I will need to pick a orthopedic foot and ankle surgeon locally though she does live in [facility].  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  Considering that this is not healing I am going to do a referral to Dr. Melony Overly with orthopedic foot and ankle surgery here in [place].    ___________________________________________ Gwen Her. Dianah Field, M.D., ABFM., CAQSM. Primary Care and Sports Medicine [facility]  Adjunct Professor of [employer]

## 2019-09-19 NOTE — Addendum Note (Signed)
Addended by: Silverio Decamp on: 09/19/2019 05:09 PM   Modules accepted: Orders

## 2019-09-19 NOTE — Assessment & Plan Note (Addendum)
Improved but now having worsening pain over the second and third metatarsal shafts, she did have a fracture, now 9 weeks out. I am concerned about nonunion, CT of the foot right now. Transition back into the boot. If we do see a nonunion or malunion I will need to pick a orthopedic foot and ankle surgeon locally though she does live in [facility].  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  Considering that this is not healing I am going to do a referral to Dr. Melony Overly with orthopedic foot and ankle surgery here in [place].

## 2019-09-19 NOTE — Addendum Note (Signed)
Addended by: Silverio Decamp on: 09/19/2019 03:13 PM   Modules accepted: Orders

## 2019-09-22 ENCOUNTER — Ambulatory Visit: Payer: Medicare Other | Admitting: Sports Medicine

## 2019-10-04 DIAGNOSIS — S92324A Nondisplaced fracture of second metatarsal bone, right foot, initial encounter for closed fracture: Secondary | ICD-10-CM | POA: Diagnosis not present

## 2019-10-04 DIAGNOSIS — M79671 Pain in right foot: Secondary | ICD-10-CM | POA: Diagnosis not present

## 2019-10-08 ENCOUNTER — Other Ambulatory Visit (HOSPITAL_COMMUNITY): Payer: Self-pay | Admitting: Psychiatry

## 2019-10-13 ENCOUNTER — Telehealth: Payer: Self-pay | Admitting: Physician Assistant

## 2019-10-13 NOTE — Telephone Encounter (Signed)
Patient called and is not sure if she had an insect bite or spider bite. She has go to for an infusion this afternoon and is going to get advice from the medical professionals at this time at [facility]. If she is not able to get treatment there she is going to [facility] to be evaluated since the area is red and hot to touch. No other questions at this time.

## 2019-10-16 ENCOUNTER — Ambulatory Visit (INDEPENDENT_AMBULATORY_CARE_PROVIDER_SITE_OTHER): Payer: Medicare Other | Admitting: Physician Assistant

## 2019-10-16 ENCOUNTER — Encounter: Payer: Self-pay | Admitting: Physician Assistant

## 2019-10-16 ENCOUNTER — Other Ambulatory Visit: Payer: Self-pay

## 2019-10-16 ENCOUNTER — Other Ambulatory Visit: Payer: Self-pay | Admitting: Neurology

## 2019-10-16 VITALS — Ht 68.5 in | Wt 166.0 lb

## 2019-10-16 DIAGNOSIS — M797 Fibromyalgia: Secondary | ICD-10-CM

## 2019-10-16 DIAGNOSIS — Z87891 Personal history of nicotine dependence: Secondary | ICD-10-CM | POA: Diagnosis not present

## 2019-10-16 DIAGNOSIS — F430 Acute stress reaction: Secondary | ICD-10-CM

## 2019-10-16 DIAGNOSIS — R21 Rash and other nonspecific skin eruption: Secondary | ICD-10-CM | POA: Diagnosis not present

## 2019-10-16 DIAGNOSIS — S90464A Insect bite (nonvenomous), right lesser toe(s), initial encounter: Secondary | ICD-10-CM

## 2019-10-16 DIAGNOSIS — Z888 Allergy status to other drugs, medicaments and biological substances status: Secondary | ICD-10-CM

## 2019-10-16 DIAGNOSIS — K5190 Ulcerative colitis, unspecified, without complications: Secondary | ICD-10-CM

## 2019-10-16 DIAGNOSIS — Z885 Allergy status to narcotic agent status: Secondary | ICD-10-CM

## 2019-10-16 DIAGNOSIS — G35 Multiple sclerosis: Secondary | ICD-10-CM | POA: Diagnosis not present

## 2019-10-16 DIAGNOSIS — F411 Generalized anxiety disorder: Secondary | ICD-10-CM

## 2019-10-16 DIAGNOSIS — W57XXXA Bitten or stung by nonvenomous insect and other nonvenomous arthropods, initial encounter: Secondary | ICD-10-CM | POA: Diagnosis not present

## 2019-10-16 DIAGNOSIS — Z91018 Allergy to other foods: Secondary | ICD-10-CM

## 2019-10-16 DIAGNOSIS — L089 Local infection of the skin and subcutaneous tissue, unspecified: Secondary | ICD-10-CM

## 2019-10-16 MED ORDER — HYDROXYZINE HCL 25 MG PO TABS: 25.0000 mg | ORAL_TABLET | Freq: Four times a day (QID) | ORAL | 0 refills | Status: DC | PRN

## 2019-10-16 MED ORDER — CLONAZEPAM 0.5 MG PO TABS: 0.5000 mg | ORAL_TABLET | Freq: Two times a day (BID) | ORAL | 2 refills | Status: DC | PRN

## 2019-10-16 MED ORDER — DOXYCYCLINE HYCLATE 100 MG PO TABS: 100.0000 mg | ORAL_TABLET | Freq: Two times a day (BID) | ORAL | 0 refills | Status: AC

## 2019-10-16 NOTE — Telephone Encounter (Signed)
Done

## 2019-10-16 NOTE — Telephone Encounter (Signed)
Patient left vm asking for refill on Clonazepam. She states she has not been able to find a primary care doctor where she moved yet. Last refill 06/14/2019 #60 with two refills. Last appt 08/25/2019. Please advise.

## 2019-10-16 NOTE — Progress Notes (Signed)
Virtual Visit via Video (App used: Doximity) Note  I connected with      Paige Lozano on 10/16/19 at 5:55 PM  by a telemedicine application and verified that I am speaking with the correct person using two identifiers.  Patient is located at permanent residence at [place] I am in office    I discussed the limitations of evaluation and management by telemedicine and the availability of in person appointments. The patient expressed understanding and agreed to proceed.  History of Present Illness: Paige Lozano is a 53 y.o. female with PMH significant for MS, UC, fibromyalgia who would like to discuss rash/insect bites  Reports she was bit by fire ants on right foot 1.5-2 weeks ago. She imeediately had itching and pain at the sites on the dorsum of her foot. She treated with Benadryl and an OTC ointment. 2nd toe at the knuckle appears very swollen, red and inflamed beginning 4 days ago Pain is moderate, persistent and worse with direct pressure/wearing shoes. She noticed there is a wound in the center that has drained a very scant amount of fluid.  She also reports a separate migratory rash that began in the last week. It started on her right medial ankle. Rash starts as a red bump and then gradually increases in size and can become very large (>4 cm). It is tender and is extremely pruritic She then noticed additional areas on her right shin, right rib cage and then below right ear and right auricle Rash lasts for 3-5 days and gradually fades Itching improves with Benadryl Denies fever, chills, arthralgias/joint swelling, headache, chest pain. Denies hx of tick bite. However, she spends a lot of time outdoors and does not always wear shoes. She had an MS infusion last week and typically has malaise for several days afterward.   Observations/Objective: Ht 5' 8.5" (1.74 m)   Wt 166 lb (75.3 kg)   BMI 24.87 kg/m  BP Readings from Last 3 Encounters:  09/19/19 109/69   08/25/19 110/81  07/31/19 119/78   Exam: Limited by video visit Gen: alert, well-appearing, appropriate for age, no acute distress Pulm: normal work of breathing, speaking in full sentences Skin: (patient will send [employer] with photos) Right 2nd distal phalanx appears red and swollen with a shallow central ulceration; scattered hypopigmented areas overlying the dorsum of the right foot  Lab and Radiology Results No results found for this or any previous visit (from the past 72 hour(s)). No results found.     Assessment and Plan: 53 y.o. female with The primary encounter diagnosis was Rash and nonspecific skin eruption. A diagnosis of Insect bite of toe, infected, right, initial encounter was also pertinent to this visit.  1. Infected insect bite, right 2nd toe Warm soaks with soapy water or diluted antiseptic x 20 mins 3-4 times per day Doxycycline bid x 7 days - chose this antibiotic due to the migratory rash and risk of tick bite/RMSF  2. Rash DDx includes allergic vasculitis Unable to fully assess due to video visit Referral to dermatology for further eval Hydroxyzine QID prn itching Counseled on callback/ED precautions Doxycycline to cover for tick-borne illness  PDMP not reviewed this encounter. Orders Placed This Encounter  Procedures  . Ambulatory referral to Dermatology    Referral Priority:   Urgent    Referral Type:   Consultation    Referral Reason:   Specialty Services Required    Requested Specialty:   Dermatology  Number of Visits Requested:   1   Meds ordered this encounter  Medications  . hydrOXYzine (ATARAX/VISTARIL) 25 MG tablet    Sig: Take 1 tablet (25 mg total) by mouth 4 (four) times daily as needed for itching.    Dispense:  30 tablet    Refill:  0    Order Specific Question:   Supervising Provider    Answer:   Emeterio Reeve [id number]  . doxycycline (VIBRA-TABS) 100 MG tablet    Sig: Take 1 tablet (100 mg total) by mouth 2  (two) times daily for 7 days.    Dispense:  14 tablet    Refill:  0    Order Specific Question:   Supervising Provider    Answer:   Emeterio Reeve [id number]   There are no Patient Instructions on file for this visit.  Instructions sent via MyChart. If MyChart not available, pt was given option for info via personal e-mail w/ no guarantee of protected health info over unsecured e-mail communication, and MyChart sign-up instructions were sent to patient.   Follow Up Instructions: No follow-ups on file.    I discussed the assessment and treatment plan with the patient. The patient was provided an opportunity to ask questions and all were answered. The patient agreed with the plan and demonstrated an understanding of the instructions.   The patient was advised to call back or seek an in-person evaluation if any new concerns, if symptoms worsen or if the condition fails to improve as anticipated.  25 minutes of non-face-to-face time was provided during this encounter.      . . . . . . . . . . . . . Marland Kitchen                   Historical information moved to improve visibility of documentation.  Past Medical History:  Diagnosis Date  . Anxiety   . Bipolar 2 disorder, major depressive episode ([place])   . Complication of anesthesia   . COPD (chronic obstructive pulmonary disease) ([place])    smoker  . Fibromyalgia   . Insomnia   . Multiple sclerosis ([place])   . PONV (postoperative nausea and vomiting)   . Pulmonary embolism ([place]) 2000  . Sleep apnea    mild OSA no CPAP  . Ulcerative colitis [facility])    Past Surgical History:  Procedure Laterality Date  . FOOT SURGERY Left    bone spur  . HEMORRHOID SURGERY    . HUMERUS FRACTURE SURGERY Left   . MYOMECTOMY    . NASAL SINUS SURGERY    . SHOULDER ARTHROSCOPY WITH SUBACROMIAL DECOMPRESSION AND BICEP TENDON REPAIR Left 07/27/2016   Procedure: SHOULDER ARTHROSCOPY DEBRIDEMENT ROTATOR CUFF AND LABRUM, SUBACROMIAL  DECOMPRESSION, BICEPS TENODESIS;  Surgeon: Tania Ade, MD;  Location: [facility];  Service: Orthopedics;  Laterality: Left;  SHOULDER ARTHROSCOPY DEBRIDEMENT ROTATOR CUFF AND LABRUM, SUBACROMIAL DECOMPRESSION, BICEPS TENOTOMY, POSSIBLE TENODESIS  . THUMB ARTHROSCOPY     5 surgeries on left thumb, 4 surgeries on right  . TONSILLECTOMY AND ADENOIDECTOMY     Social History   Tobacco Use  . Smoking status: Former Smoker    Packs/day: 0.25    Types: Cigarettes    Quit date: 07/08/2016    Years since quitting: 3.2  . Smokeless tobacco: Never Used  Substance Use Topics  . Alcohol use: Yes    Alcohol/week: 0.0 standard drinks    Comment: rare    family history includes Breast cancer in  her sister. She was adopted.  Medications: Current Outpatient Medications  Medication Sig Dispense Refill  . albuterol (PROVENTIL HFA;VENTOLIN HFA) 108 (90 Base) MCG/ACT inhaler Inhale 2 puffs into the lungs every 6 (six) hours as needed for wheezing or shortness of breath. 1 Inhaler 0  . balsalazide (COLAZAL) 750 MG capsule Take 3 capsules (2,250 mg total) by mouth 3 (three) times daily. 270 capsule 1  . Cholecalciferol (VITAMIN D-1000 MAX ST) 1000 units tablet Take by mouth.    . clonazePAM (KLONOPIN) 0.5 MG tablet Take 1 tablet (0.5 mg total) by mouth 2 (two) times daily as needed. 60 tablet 2  . fluticasone (FLONASE) 50 MCG/ACT nasal spray Place 2 sprays into both nostrils daily. 16 g 3  . folic acid (FOLVITE) 1 MG tablet Take 1 mg by mouth daily.    Marland Kitchen ibuprofen (ADVIL) 800 MG tablet Take 800 mg by mouth as needed.    . lamoTRIgine (LAMICTAL) 150 MG tablet TAKE 1 TABLET BY MOUTH TWICE DAILY 60 tablet 2  . methocarbamol (ROBAXIN) 500 MG tablet Take 1 tablet (500 mg total) by mouth 3 (three) times daily. 90 tablet 0  . Multiple Vitamin (MULTI-VITAMINS) TABS Take by mouth.    . QUEtiapine (SEROQUEL) 200 MG tablet Take 1 tablet (200 mg total) by mouth at bedtime. 30 tablet 5  .  triamcinolone cream (KENALOG) 0.5 % Apply 1 application topically 2 (two) times daily. To affected areas. 30 g 3  . doxycycline (VIBRA-TABS) 100 MG tablet Take 1 tablet (100 mg total) by mouth 2 (two) times daily for 7 days. 14 tablet 0  . hydrOXYzine (ATARAX/VISTARIL) 25 MG tablet Take 1 tablet (25 mg total) by mouth 4 (four) times daily as needed for itching. 30 tablet 0   No current facility-administered medications for this visit.    Allergies  Allergen Reactions  . Ketamine Anaphylaxis  . Oxycodone Diarrhea  . Papaya Enzyme Anaphylaxis

## 2019-10-16 NOTE — Telephone Encounter (Signed)
Patient requests a refill on Clonazepam.

## 2019-11-13 DIAGNOSIS — L508 Other urticaria: Secondary | ICD-10-CM | POA: Diagnosis not present

## 2019-11-21 ENCOUNTER — Other Ambulatory Visit: Payer: Self-pay

## 2019-11-21 ENCOUNTER — Ambulatory Visit (INDEPENDENT_AMBULATORY_CARE_PROVIDER_SITE_OTHER): Payer: Medicare Other | Admitting: Physician Assistant

## 2019-11-21 VITALS — BP 134/79 | HR 90 | Ht 68.5 in | Wt 174.0 lb

## 2019-11-21 DIAGNOSIS — M7989 Other specified soft tissue disorders: Secondary | ICD-10-CM

## 2019-11-21 DIAGNOSIS — T148XXA Other injury of unspecified body region, initial encounter: Secondary | ICD-10-CM

## 2019-11-21 DIAGNOSIS — R2241 Localized swelling, mass and lump, right lower limb: Secondary | ICD-10-CM

## 2019-11-21 DIAGNOSIS — Z86018 Personal history of other benign neoplasm: Secondary | ICD-10-CM | POA: Diagnosis not present

## 2019-11-22 ENCOUNTER — Other Ambulatory Visit (HOSPITAL_BASED_OUTPATIENT_CLINIC_OR_DEPARTMENT_OTHER): Payer: Self-pay | Admitting: Physician Assistant

## 2019-11-22 ENCOUNTER — Encounter: Payer: Self-pay | Admitting: Physician Assistant

## 2019-11-22 ENCOUNTER — Telehealth: Payer: Self-pay | Admitting: Neurology

## 2019-11-22 DIAGNOSIS — R200 Anesthesia of skin: Secondary | ICD-10-CM | POA: Diagnosis not present

## 2019-11-22 DIAGNOSIS — R413 Other amnesia: Secondary | ICD-10-CM | POA: Diagnosis not present

## 2019-11-22 DIAGNOSIS — Z86018 Personal history of other benign neoplasm: Secondary | ICD-10-CM | POA: Insufficient documentation

## 2019-11-22 DIAGNOSIS — T148XXA Other injury of unspecified body region, initial encounter: Secondary | ICD-10-CM | POA: Insufficient documentation

## 2019-11-22 DIAGNOSIS — Z79899 Other long term (current) drug therapy: Secondary | ICD-10-CM | POA: Diagnosis not present

## 2019-11-22 DIAGNOSIS — R5383 Other fatigue: Secondary | ICD-10-CM | POA: Diagnosis not present

## 2019-11-22 DIAGNOSIS — R2241 Localized swelling, mass and lump, right lower limb: Secondary | ICD-10-CM

## 2019-11-22 DIAGNOSIS — M7989 Other specified soft tissue disorders: Secondary | ICD-10-CM

## 2019-11-22 DIAGNOSIS — G35 Multiple sclerosis: Secondary | ICD-10-CM | POA: Diagnosis not present

## 2019-11-22 NOTE — Telephone Encounter (Signed)
Patient left vm that her derma bond from yesterday has come off. I spoke with Luvenia Starch and she told me to instruct patient to keep area clean with soap/water, pat dry, and cover with non-stick bandage. She was made aware and will call with any problems.

## 2019-11-22 NOTE — Progress Notes (Signed)
Subjective:    Patient ID: Paige Lozano, female    DOB: 1966/08/24, 54 y.o.   MRN: [id number]  HPI Pt is a 54 yo female who presents to the clinic with abrasion to left posterior heel and mass of right hip.   Pt has noticed the mass in right hip for a few weeks. She has hx of lipomas. She is concerned because her sister was just dx with cancer and she wants it checked out. She thinks it could be growing some. Not painful. No injury.   Yesterday her storm door hit her left heel. It has bled a lot. She has washed it and put neosporin on it. She wonders if it needs stitches.   .. Active Ambulatory Problems    Diagnosis Date Noted  . Multiple sclerosis ([place]) 05/29/2016  . Fibromyalgia 05/29/2016  . 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 Insomnia 05/29/2016  . Ulcerative pancolitis without complication ([place]) [id number]  . Bipolar 2 disorder ([place]) 05/29/2016  . Current smoker 05/29/2016  . Primary osteoarthritis of right knee with recent ACL tear and femoral condylar impaction fracture 06/01/2016  . Anxiety state 06/01/2016  . Left shoulder pain 06/01/2016  . GAD (generalized anxiety disorder) 07/20/2016  . History of pulmonary embolus (PE) 07/21/2016  . Right elbow pain 07/21/2016  . Hot flashes 12/29/2016  . Body aches 12/29/2016  . Pruritus 12/29/2016  . Palpitations 03/08/2017  . Chest tightness 03/08/2017  . Tachycardia 03/08/2017  . Sinus tachycardia 03/12/2017  . PAC (premature atrial contraction) 03/12/2017  . Lumbar degenerative disc disease 06/02/2017  . Plantar fasciitis, bilateral 10/29/2017  . Onychomycosis 02/17/2018  . 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 Subcutaneous mass 04/07/2018  . Family history of breast cancer 12/02/2018  . Acute stress reaction 03/13/2019  . Fracture of second and third metatarsals of the right foot 07/14/2019  . Left wrist injury 07/31/2019  . Thyroid disorder screen 09/04/2019  . Actinic keratoses 09/04/2019  . Abrasion 11/22/2019  .  History of lipoma 11/22/2019   Resolved Ambulatory Problems    Diagnosis Date Noted  . Right lateral epicondylitis 07/21/2016  . Sinusitis 11/30/2016  . Right wrist injury 12/29/2016  . Hand injury, left, subsequent encounter 12/30/2016  . Chills (without fever) 03/08/2017   Past Medical History:  Diagnosis Date  . Anxiety   . Bipolar 2 disorder, major depressive episode ([place])   . Complication of anesthesia   . COPD (chronic obstructive pulmonary disease) ([place])   . PONV (postoperative nausea and vomiting)   . Pulmonary embolism ([place]) 2000  . Sleep apnea   . Ulcerative colitis ([place])       Review of Systems See HPI.     Objective:   Physical Exam Vitals reviewed.  Constitutional:      Appearance: Normal appearance.  Pulmonary:     Effort: Pulmonary effort is normal.  Musculoskeletal:     Comments: Right lateral gluteus medias walnut sized mobile non tender mass.   Skin:    Comments: Left heel just below insertion of achilles tendon 2 cm curved shaped superficial abrasion.  NROM of foot. No swelling or drainage.   Neurological:     General: No focal deficit present.     Mental Status: She is alert and oriented to person, place, and time.           Assessment & Plan:  Marland KitchenMarland KitchenSenai was seen today for mass.  Diagnoses and all orders for this visit:  Soft tissue mass -     Cancel:  [place] MiscellaneoUS Localization -     US Pelvis Limited; Future  Hip mass, right -     Cancel: [place] MiscellaneoUS Localization -     US Pelvis Limited; Future  Abrasion  History of lipoma   Pt has hx of lipoma. Mass is mobile and non tender. I suspect lipoma. Pt is concerned and her sister was recently diagnosed with cancer and she wants to be sure.   Abrasion was cleaned with Hibiclens and derma bond used to close small flap of skin that was open and still bleeding. Non stick bandaid placed on wound and wrapped with coban.

## 2019-11-23 ENCOUNTER — Other Ambulatory Visit: Payer: Self-pay | Admitting: Physician Assistant

## 2019-11-23 NOTE — Addendum Note (Signed)
Addended byAnnamaria Helling on: 11/23/2019 02:09 PM   Modules accepted: Orders

## 2019-11-24 ENCOUNTER — Other Ambulatory Visit: Payer: Self-pay

## 2019-11-24 ENCOUNTER — Ambulatory Visit (HOSPITAL_BASED_OUTPATIENT_CLINIC_OR_DEPARTMENT_OTHER): Payer: Medicare Other

## 2019-11-24 ENCOUNTER — Ambulatory Visit (HOSPITAL_BASED_OUTPATIENT_CLINIC_OR_DEPARTMENT_OTHER)
Admission: RE | Admit: 2019-11-24 | Discharge: 2019-11-24 | Disposition: A | Discharge status: Home / Self Care | Payer: Medicare Other | Source: Ambulatory Visit | Attending: Physician Assistant | Admitting: Physician Assistant

## 2019-11-24 DIAGNOSIS — R2241 Localized swelling, mass and lump, right lower limb: Secondary | ICD-10-CM | POA: Diagnosis not present

## 2019-11-24 DIAGNOSIS — M7989 Other specified soft tissue disorders: Secondary | ICD-10-CM | POA: Insufficient documentation

## 2019-11-26 NOTE — Progress Notes (Signed)
Paige Lozano,   Soft tissue nodule with adjacent fluid was unclear by ultrasound. Radiology suggest could be inflammatory or due to trauma but if persist we need to get MRI without and with contrast to further evaluate.

## 2019-12-08 ENCOUNTER — Telehealth: Payer: Self-pay

## 2019-12-08 MED ORDER — FLUCONAZOLE 150 MG PO TABS: 150.0000 mg | ORAL_TABLET | Freq: Once | ORAL | 0 refills | Status: AC

## 2019-12-08 NOTE — Telephone Encounter (Signed)
Paige Lozano called and reports vaginal itching for a couple of days. She states she was on an antibiotic a week ago. Denies fever, chills, sweats or pelvic pain. She would like the diflucan sent to [facility] in [facility]. Pharmacy pended.

## 2019-12-08 NOTE — Telephone Encounter (Signed)
Sent to pharmacy 

## 2019-12-08 NOTE — Telephone Encounter (Signed)
Patient advised of prescription.

## 2019-12-09 DIAGNOSIS — M47812 Spondylosis without myelopathy or radiculopathy, cervical region: Secondary | ICD-10-CM | POA: Diagnosis not present

## 2019-12-09 DIAGNOSIS — M5124 Other intervertebral disc displacement, thoracic region: Secondary | ICD-10-CM | POA: Diagnosis not present

## 2019-12-09 DIAGNOSIS — G35 Multiple sclerosis: Secondary | ICD-10-CM | POA: Diagnosis not present

## 2019-12-09 DIAGNOSIS — M47814 Spondylosis without myelopathy or radiculopathy, thoracic region: Secondary | ICD-10-CM | POA: Diagnosis not present

## 2019-12-12 DIAGNOSIS — T783XXD Angioneurotic edema, subsequent encounter: Secondary | ICD-10-CM | POA: Diagnosis not present

## 2019-12-12 DIAGNOSIS — L299 Pruritus, unspecified: Secondary | ICD-10-CM | POA: Diagnosis not present

## 2019-12-20 ENCOUNTER — Telehealth: Payer: Self-pay | Admitting: Neurology

## 2019-12-20 MED ORDER — CHANTIX STARTING MONTH PAK 0.5 MG X 11 & 1 MG X 42 PO TABS: ORAL_TABLET | ORAL | 0 refills | Status: DC

## 2019-12-20 NOTE — Telephone Encounter (Signed)
Patient left vm stating she has started smoking again. She has used Chantix in the past and has two bottles left of it. She would like to restart, but wondering if we can send in starter pack. Please advise if okay to send.

## 2019-12-20 NOTE — Telephone Encounter (Signed)
Patient made aware. Requested this be sent to [facility]. RX sent. She will let us know if not affordable.

## 2019-12-20 NOTE — Telephone Encounter (Signed)
Ok to send

## 2019-12-21 NOTE — Telephone Encounter (Signed)
Spoke with patient. She states starter pack is $450. Coupons state the same on goodrx. She wanted to know about splitting tablets. I spoke with Dr. Madilyn Fireman who states they are not scored for cutting. They are tablets, however, so if she is able to cut in half with pill cutter she could proceed with the following directions: take 1/2 tablet QD for 3 days, then 1/2 tablet BID for 7 days. She can then increase to 1 mg tablet BID. Patient made aware of instructions. She will call back if needed. [place].

## 2020-01-04 DIAGNOSIS — L600 Ingrowing nail: Secondary | ICD-10-CM | POA: Diagnosis not present

## 2020-01-05 DIAGNOSIS — G35 Multiple sclerosis: Secondary | ICD-10-CM | POA: Diagnosis not present

## 2020-01-15 DIAGNOSIS — M19071 Primary osteoarthritis, right ankle and foot: Secondary | ICD-10-CM | POA: Diagnosis not present

## 2020-01-15 DIAGNOSIS — M25571 Pain in right ankle and joints of right foot: Secondary | ICD-10-CM | POA: Diagnosis not present

## 2020-01-15 DIAGNOSIS — L600 Ingrowing nail: Secondary | ICD-10-CM | POA: Diagnosis not present

## 2020-01-20 ENCOUNTER — Other Ambulatory Visit (HOSPITAL_COMMUNITY): Payer: Self-pay | Admitting: Psychiatry

## 2020-01-24 ENCOUNTER — Other Ambulatory Visit (HOSPITAL_COMMUNITY): Payer: Self-pay | Admitting: Psychiatry

## 2020-01-24 MED ORDER — LAMOTRIGINE 150 MG PO TABS: 150.0000 mg | ORAL_TABLET | Freq: Two times a day (BID) | ORAL | 0 refills | Status: DC

## 2020-01-24 NOTE — Telephone Encounter (Signed)
Pt calling requesting refill on Lamictal [facility] [place].

## 2020-02-08 DIAGNOSIS — L600 Ingrowing nail: Secondary | ICD-10-CM | POA: Diagnosis not present

## 2020-02-12 ENCOUNTER — Other Ambulatory Visit: Payer: Self-pay | Admitting: Physician Assistant

## 2020-02-12 DIAGNOSIS — F430 Acute stress reaction: Secondary | ICD-10-CM

## 2020-02-12 DIAGNOSIS — F411 Generalized anxiety disorder: Secondary | ICD-10-CM

## 2020-02-13 ENCOUNTER — Telehealth: Payer: Self-pay | Admitting: Neurology

## 2020-02-13 DIAGNOSIS — F411 Generalized anxiety disorder: Secondary | ICD-10-CM

## 2020-02-13 DIAGNOSIS — F430 Acute stress reaction: Secondary | ICD-10-CM

## 2020-02-13 MED ORDER — CLONAZEPAM 0.5 MG PO TABS: 0.5000 mg | ORAL_TABLET | Freq: Two times a day (BID) | ORAL | 1 refills | Status: DC | PRN

## 2020-02-13 NOTE — Telephone Encounter (Signed)
Clonazepam denied yesterday. Patient keeps calling and requesting a refill. Abigail Butts wants to know reason for denial to let patient know. It looks like hasn't had a normal follow up since October. Please advise.

## 2020-02-13 NOTE — Telephone Encounter (Signed)
Sent refill. It had been 6 months since last refill.

## 2020-02-14 NOTE — Telephone Encounter (Signed)
Patient made aware. Sent to front to make follow up appt.

## 2020-02-23 ENCOUNTER — Encounter (HOSPITAL_COMMUNITY): Payer: Self-pay | Admitting: Psychiatry

## 2020-02-23 ENCOUNTER — Ambulatory Visit (INDEPENDENT_AMBULATORY_CARE_PROVIDER_SITE_OTHER): Payer: Medicare Other | Admitting: Psychiatry

## 2020-02-23 DIAGNOSIS — F3181 Bipolar II disorder: Secondary | ICD-10-CM

## 2020-02-23 DIAGNOSIS — F4310 Post-traumatic stress disorder, unspecified: Secondary | ICD-10-CM | POA: Diagnosis not present

## 2020-02-23 DIAGNOSIS — F411 Generalized anxiety disorder: Secondary | ICD-10-CM | POA: Diagnosis not present

## 2020-02-23 DIAGNOSIS — F5102 Adjustment insomnia: Secondary | ICD-10-CM

## 2020-02-23 MED ORDER — LAMOTRIGINE 150 MG PO TABS: 150.0000 mg | ORAL_TABLET | Freq: Two times a day (BID) | ORAL | 2 refills | Status: DC

## 2020-02-23 NOTE — Progress Notes (Signed)
[facility] Outpatient Follow up visit   Patient Identification: Paige Lozano MRN:  [id number] Date of Evaluation:  02/23/2020 Referral Source: Luvenia [facility] Primary care Chief Complaint:    bipolar follow up  Visit Diagnosis:    ICD-10-CM   1. Bipolar 2 disorder (HCC)  F31.81   2. PTSD (post-traumatic stress disorder)  F43.10   3. GAD (generalized anxiety disorder)  F41.1   4. Adjustment insomnia  F51.02      I connected with [name] on 02/23/20 at  9:30 AM EDT by telephone and verified that I am speaking with the correct person using two identifiers.    I discussed the limitations of evaluation and management by telemedicine and the availability of in person appointments. The patient expressed understanding and agreed to proceed.  History of Present Illness:  54  years old Returns for follow-up and medication management   Last seen 7 months ago. Running low on lamictal [place] moved to [place] likes it there and adjusting , made some friends   Had breakup with BF and lost job in the past that effected mood  On seroquel at night No rash on lamictal  Feels meds are at right dose.  Takes klonopine prn for anxiety and mood   Aggravating factor: surgeries . Multiple medical .surgeries, breakdup Modifying factor: dogs   Duration more then 10 years     Previous Psychotropic Medications: Yes   Substance Abuse History in the last 12 months:  Yes.   as per history Marijuana says it is medical for her condition.  Consequences of Substance Abuse: Medical Consequences:  fatigue, poor concenctration  Past Medical History:  Past Medical History:  Diagnosis Date  . Anxiety   . Bipolar 2 disorder, major depressive episode ([place])   . Complication of anesthesia   . COPD (chronic obstructive pulmonary disease) ([place])    smoker  . Fibromyalgia   . Insomnia   . Multiple sclerosis ([place])   . PONV (postoperative nausea and vomiting)   . Pulmonary embolism ([place]) 2000  . Sleep apnea    mild OSA no CPAP  . Ulcerative colitis [facility])     Past Surgical History:  Procedure Laterality Date  . FOOT SURGERY Left    bone spur  . HEMORRHOID SURGERY    . HUMERUS FRACTURE SURGERY Left   . MYOMECTOMY    . NASAL SINUS SURGERY    . SHOULDER ARTHROSCOPY WITH SUBACROMIAL DECOMPRESSION AND BICEP TENDON REPAIR Left 07/27/2016   Procedure: SHOULDER ARTHROSCOPY DEBRIDEMENT ROTATOR CUFF AND LABRUM, SUBACROMIAL DECOMPRESSION, BICEPS TENODESIS;  Surgeon: Tania Ade, MD;  Location: [facility];  Service: Orthopedics;  Laterality: Left;  SHOULDER ARTHROSCOPY DEBRIDEMENT ROTATOR CUFF AND LABRUM, SUBACROMIAL DECOMPRESSION, BICEPS TENOTOMY, POSSIBLE TENODESIS  . THUMB ARTHROSCOPY     5 surgeries on left thumb, 4 surgeries on right  . TONSILLECTOMY AND ADENOIDECTOMY       Family History:  Family History  Adopted: Yes  Problem Relation Age of Onset  . Breast cancer Sister        Triple Negative    Social History:   Social History   Socioeconomic History  . Marital status: Divorced    Spouse name: Not on file  . Number of children: Not on file  . Years of education: Not on file  . Highest education level: Not on file  Occupational History  . Not on file  Tobacco Use  . Smoking status: Former Smoker    Packs/day: 0.25    Types:  Cigarettes    Quit date: 07/08/2016    Years since quitting: 3.6  . Smokeless tobacco: Never Used  Substance and Sexual Activity  . Alcohol use: Yes    Alcohol/week: 0.0 standard drinks    Comment: rare   . Drug use: No    Types: Marijuana    Comment: last use 5 month ago  . Sexual activity: Yes    Partners: Male    Birth control/protection: None  Other Topics Concern  . Not on file  Social History Narrative  . Not on file   Social Determinants of Health   Financial Resource Strain:   . Difficulty of Paying Living Expenses:   Food Insecurity:   . Worried About Charity fundraiser in the Last Year:   . Arboriculturist in the  Last Year:   Transportation Needs:   . Film/video editor (Medical):   Marland Kitchen Lack of Transportation (Non-Medical):   Physical Activity:   . Days of Exercise per Week:   . Minutes of Exercise per Session:   Stress:   . Feeling of Stress :   Social Connections:   . Frequency of Communication with Friends and Family:   . Frequency of Social Gatherings with Friends and Family:   . Attends Religious Services:   . Active Member of Clubs or Organizations:   . Attends Archivist Meetings:   Marland Kitchen Marital Status:      Allergies:   Allergies  Allergen Reactions  . Ketamine Anaphylaxis  . Oxycodone Diarrhea  . Papaya Enzyme Anaphylaxis    Metabolic Disorder Labs: No results found for: HGBA1C, MPG No results found for: PROLACTIN No results found for: CHOL, TRIG, HDL, CHOLHDL, VLDL, LDLCALC   Current Medications: Current Outpatient Medications  Medication Sig Dispense Refill  . albuterol (PROVENTIL HFA;VENTOLIN HFA) 108 (90 Base) MCG/ACT inhaler Inhale 2 puffs into the lungs every 6 (six) hours as needed for wheezing or shortness of breath. 1 Inhaler 0  . balsalazide (COLAZAL) 750 MG capsule Take 3 capsules (2,250 mg total) by mouth 3 (three) times daily. 270 capsule 1  . Cholecalciferol (VITAMIN D-1000 MAX ST) 1000 units tablet Take by mouth.    . clonazePAM (KLONOPIN) 0.5 MG tablet Take 1 tablet (0.5 mg total) by mouth 2 (two) times daily as needed. 60 tablet 1  . fluticasone (FLONASE) 50 MCG/ACT nasal spray Place 2 sprays into both nostrils daily. 16 g 3  . hydrOXYzine (ATARAX/VISTARIL) 25 MG tablet Take 1 tablet (25 mg total) by mouth 4 (four) times daily as needed for itching. 30 tablet 0  . ibuprofen (ADVIL) 800 MG tablet Take 800 mg by mouth as needed.    . lamoTRIgine (LAMICTAL) 150 MG tablet Take 1 tablet (150 mg total) by mouth 2 (two) times daily. 60 tablet 2  . Multiple Vitamin (MULTI-VITAMINS) TABS Take by mouth.    . QUEtiapine (SEROQUEL) 200 MG tablet Take 1  tablet (200 mg total) by mouth at bedtime. 30 tablet 5  . varenicline (CHANTIX STARTING MONTH PAK) 0.5 MG X 11 & 1 MG X 42 tablet Take one 0.5 mg tablet by mouth once daily for 3 days, then increase to one 0.5 mg tablet twice daily for 4 days, then increase to one 1 mg tablet twice daily. 53 tablet 0   No current facility-administered medications for this visit.      Psychiatric Specialty Exam: Review of Systems  Cardiovascular: Negative for chest pain and palpitations.  Musculoskeletal: Positive  for myalgias.  Skin: Negative for rash.  Neurological: Negative for tremors.  Psychiatric/Behavioral: Negative for suicidal ideas.    There were no vitals taken for this visit.There is no height or weight on file to calculate BMI.  General Appearance:   Eye Contact:    Speech:  Normal Rate  Volume:  normal  Mood: fair  Affect:  congruent  Thought Process:  Goal Directed  Orientation:  Full (Time, Place, and Person)  Thought Content:  Logical  Suicidal Thoughts:  No  Homicidal Thoughts:  No  Memory:  Immediate;   Fair Recent;   Fair  Judgement:  Fair  Insight:  Fair  Psychomotor Activity:  Normal  Concentration:  Concentration: Fair and Attention Span: Fair  Recall:  [employer] of Knowledge:Fair  Language: Fair  Akathisia:  Negative  Handed:  Right  AIMS (if indicated):    Assets:  Desire for Improvement  ADL's:  Intact  Cognition: WNL  Sleep:  Fair while on meds    Treatment Plan Summary: Medication management and Plan as follows  Bipolar 2: depressed; doing fair, [place], continue lamictal and seroquel No tremors descrived GAD: balanced on klonopine gets from primary care Consider therapy if needed   Insomnia: fluctuates, reviewed sleep hygiene , take seroquel at night  FU 7m or earlier if needed I discussed the assessment and treatment plan with the patient. The patient was provided an opportunity to ask questions and all were answered. The patient  agreed with the plan and demonstrated an understanding of the instructions.   The patient was advised to call back or seek an in-person evaluation if the symptoms worsen or if the condition fails to improve as anticipated. Time spent non face to face 15 min or less   Merian Capron, MD 4/16/20219:45 AM

## 2020-04-04 ENCOUNTER — Other Ambulatory Visit: Payer: Self-pay | Admitting: Neurology

## 2020-04-04 ENCOUNTER — Other Ambulatory Visit: Payer: Self-pay | Admitting: Physician Assistant

## 2020-04-04 DIAGNOSIS — F411 Generalized anxiety disorder: Secondary | ICD-10-CM

## 2020-04-04 DIAGNOSIS — F430 Acute stress reaction: Secondary | ICD-10-CM

## 2020-04-04 NOTE — Telephone Encounter (Signed)
Called patient and let her know that refill is on file at pharmacy. Patient didn't realize she had refill, no other concerns at this time.   No further needs at this time

## 2020-04-04 NOTE — Telephone Encounter (Signed)
Patient called in needing a refill for clonazePAM (KLONOPIN) 0.5 MG tablet [id number] . She needs enough to hold her off with PCP. Please send to [place] [place] [place] [phone] as soon as possible. Thanks.

## 2020-04-04 NOTE — Telephone Encounter (Signed)
Last filled 02/13/2020 #60 with one refill.  Patient making follow up appt. Please fill until she can get in.

## 2020-04-05 NOTE — Telephone Encounter (Signed)
Looks like patient called back about this. I removed Clonazepam request.

## 2020-04-05 NOTE — Telephone Encounter (Signed)
I had a note earlier that pt had refill at pharmacy. I can decline this one?

## 2020-04-15 ENCOUNTER — Ambulatory Visit: Payer: Medicare Other | Admitting: Sports Medicine

## 2020-04-22 ENCOUNTER — Telehealth (INDEPENDENT_AMBULATORY_CARE_PROVIDER_SITE_OTHER): Payer: Medicare Other | Admitting: Physician Assistant

## 2020-04-22 VITALS — Ht 68.5 in | Wt 167.0 lb

## 2020-04-22 DIAGNOSIS — F430 Acute stress reaction: Secondary | ICD-10-CM

## 2020-04-22 DIAGNOSIS — F3181 Bipolar II disorder: Secondary | ICD-10-CM

## 2020-04-22 DIAGNOSIS — F411 Generalized anxiety disorder: Secondary | ICD-10-CM

## 2020-04-22 DIAGNOSIS — F5101 Primary insomnia: Secondary | ICD-10-CM | POA: Diagnosis not present

## 2020-04-22 MED ORDER — CLONAZEPAM 0.5 MG PO TABS: 0.5000 mg | ORAL_TABLET | Freq: Two times a day (BID) | ORAL | 5 refills | Status: DC | PRN

## 2020-04-22 MED ORDER — QUETIAPINE FUMARATE 200 MG PO TABS: 200.0000 mg | ORAL_TABLET | Freq: Every day | ORAL | 5 refills | Status: DC

## 2020-04-22 NOTE — Progress Notes (Signed)
Patient ID: Paige Lozano, female   DOB: August 25, 1966, 54 y.o.   MRN: [id number] .Marland KitchenVirtual Visit via Video Note  I connected with Paige Lozano on 04/22/2020 at  4:20 PM EDT by a video enabled telemedicine application and verified that I am speaking with the correct person using two identifiers.  Location: Patient: car Provider: clinic   I discussed the limitations of evaluation and management by telemedicine and the availability of in person appointments. The patient expressed understanding and agreed to proceed.  History of Present Illness: Patient is a 54 year old female with ulcerative pancolitis, PACs, fibromyalgia, bipolar 2 disorder, GAD who calls into the clinic for discussion about healthcare and refills.  She recently moved to [facility] and is having a lot of trouble finding a good healthcare team.  She is willing to make travel back to our office if we will keep seeing her as a patient.  She wants to discuss how often she would have to be seen in the office.  [place].  She feels like her mood is very well controlled.  Her sleep is also doing very well.  She will need a refill on Seroquel and Klonopin.  She recently did injure her meniscus after fully hearing from her ACL tear.  She is having some trouble with exercising due to knee pain.  She sees orthopedics for this.  .. Active Ambulatory Problems    Diagnosis Date Noted  . Multiple sclerosis ([place]) 05/29/2016  . Fibromyalgia 05/29/2016  . 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 Insomnia 05/29/2016  . Ulcerative pancolitis without complication ([place]) 11/17/3233  . Bipolar 2 disorder ([place]) 05/29/2016  . Current smoker 05/29/2016  . Primary osteoarthritis of right knee with recent ACL tear and femoral condylar impaction fracture 06/01/2016  . Anxiety state 06/01/2016  . Left shoulder pain 06/01/2016  . GAD (generalized anxiety disorder) 07/20/2016  . History of pulmonary embolus ([place]) 07/21/2016   . Right elbow pain 07/21/2016  . Hot flashes 12/29/2016  . Body aches 12/29/2016  . Pruritus 12/29/2016  . Palpitations 03/08/2017  . Chest tightness 03/08/2017  . Tachycardia 03/08/2017  . Sinus tachycardia 03/12/2017  . PAC (premature atrial contraction) 03/12/2017  . Lumbar degenerative disc disease 06/02/2017  . Plantar fasciitis, bilateral 10/29/2017  . Onychomycosis 02/17/2018  . 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 Subcutaneous mass 04/07/2018  . Family history of breast cancer 12/02/2018  . Acute stress reaction 03/13/2019  . Fracture of second and third metatarsals of the right foot 07/14/2019  . Left wrist injury 07/31/2019  . Thyroid disorder screen 09/04/2019  . Actinic keratoses 09/04/2019  . Abrasion 11/22/2019  . History of lipoma 11/22/2019   Resolved Ambulatory Problems    Diagnosis Date Noted  . Right lateral epicondylitis 07/21/2016  . Sinusitis 11/30/2016  . Right wrist injury 12/29/2016  . Hand injury, left, subsequent encounter 12/30/2016  . Chills (without fever) 03/08/2017   Past Medical History:  Diagnosis Date  . Anxiety   . Bipolar 2 disorder, major depressive episode ([place])   . Complication of anesthesia   . COPD (chronic obstructive pulmonary disease) ([place])   . PONV (postoperative nausea and vomiting)   . Pulmonary embolism ([place]) 2000  . Sleep apnea   . Ulcerative colitis ([place])    Reviewed med, allergy, problem list.    Observations/Objective: No acute distress Normal mood and appearance Normal breathing  .Marland Kitchen Today's Vitals   04/22/20 1621  Weight: 167 lb (75.8 kg)  Height: 5'  8.5" (1.74 m)   Body mass index is 25.02 kg/m.    Assessment and Plan: Marland KitchenMarland KitchenChestine was seen today for follow-up.  Diagnoses and all orders for this visit:  GAD (generalized anxiety disorder)  Anxiety state -     clonazePAM (KLONOPIN) 0.5 MG tablet; Take 1 tablet (0.5 mg total) by mouth 2 (two) times daily as needed.  Acute stress reaction -     clonazePAM  (KLONOPIN) 0.5 MG tablet; Take 1 tablet (0.5 mg total) by mouth 2 (two) times daily as needed.  Primary insomnia -     QUEtiapine (SEROQUEL) 200 MG tablet; Take 1 tablet (200 mg total) by mouth at bedtime.  Bipolar 2 disorder ([place])   Discussed that I would need to see her in the clinic at least every 6 months.  Patient is okay with that.  Discussed there may be a need for her to go to urgent care if she has any acute needs.  Patient okay with that.  Discussed she would need to get regular labs.  For now agreed to see her even though she lives at [facility].  We will try her best to work around her needs.  Follow up in 6 months.     Follow Up Instructions:    I discussed the assessment and treatment plan with the patient. The patient was provided an opportunity to ask questions and all were answered. The patient agreed with the plan and demonstrated an understanding of the instructions.   The patient was advised to call back or seek an in-person evaluation if the symptoms worsen or if the condition fails to improve as anticipated.  I provided 12 minutes of non-face-to-face time during this encounter.   Iran Planas, PA-C

## 2020-04-22 NOTE — Progress Notes (Signed)
[place] doctor there, but wanted to discuss this with you first?

## 2020-05-08 ENCOUNTER — Telehealth: Payer: Self-pay | Admitting: Neurology

## 2020-05-08 DIAGNOSIS — G35 Multiple sclerosis: Secondary | ICD-10-CM

## 2020-05-08 DIAGNOSIS — Z131 Encounter for screening for diabetes mellitus: Secondary | ICD-10-CM

## 2020-05-08 DIAGNOSIS — Z1329 Encounter for screening for other suspected endocrine disorder: Secondary | ICD-10-CM

## 2020-05-08 DIAGNOSIS — L659 Nonscarring hair loss, unspecified: Secondary | ICD-10-CM

## 2020-05-08 NOTE — Telephone Encounter (Signed)
Patient left vm to have her lab orders sent to [facility] at [phone] for her to have done Friday.   She is having an issue with her hair falling out. She wanted to know if we could add any labs pertinent to that before I send. Please advise.

## 2020-05-08 NOTE — Telephone Encounter (Signed)
Ok to order. TSH, CBC, ferritin, iron panel, TIBC, CMP, testosterone, DHEA-S.

## 2020-05-08 NOTE — Telephone Encounter (Signed)
Labs ordered and faxed to number provided with confirmation received. Patient made aware.

## 2020-05-10 ENCOUNTER — Telehealth (HOSPITAL_COMMUNITY): Payer: Medicare Other | Admitting: Psychiatry

## 2020-05-16 DIAGNOSIS — S83231A Complex tear of medial meniscus, current injury, right knee, initial encounter: Secondary | ICD-10-CM | POA: Insufficient documentation

## 2020-05-17 LAB — HEPATIC FUNCTION PANEL
ALT: 9 (ref 7–35)
AST: 16 (ref 13–35)
Alkaline Phosphatase: 89 (ref 25–125)
Bilirubin, Total: 0.2

## 2020-05-17 LAB — TSH: TSH: 0.73 (ref 0.41–5.90)

## 2020-05-17 LAB — TESTOSTERONE: Testosterone: <3

## 2020-05-17 LAB — COMPREHENSIVE METABOLIC PANEL
Albumin: 4.3 (ref 3.5–5.0)
Calcium: 9 (ref 8.7–10.7)
Globulin: 1.8

## 2020-05-17 LAB — BASIC METABOLIC PANEL
BUN: 10 (ref 4–21)
CO2: 22 (ref 13–22)
Chloride: 105 (ref 99–108)
Creatinine: 0.9 (ref 0.5–1.1)
Glucose: 114
Potassium: 4 (ref 3.4–5.3)
Sodium: 143 (ref 137–147)

## 2020-05-17 LAB — CBC AND DIFFERENTIAL
HCT: 38 (ref 36–46)
Hemoglobin: 12.3 (ref 12.0–16.0)
Neutrophils Absolute: 4
Platelets: 179 (ref 150–399)
WBC: 6.8

## 2020-05-17 LAB — FERRITIN: Ferritin: 99.7

## 2020-05-17 LAB — DHEA: DHEA: 16.4

## 2020-05-17 LAB — CBC: RBC: 4.17 (ref 3.87–5.11)

## 2020-05-17 NOTE — Telephone Encounter (Signed)
Patient called back stating infusion was moved to the [facility] center and she wanted lab requisition sent there. Lab Req sent to [phone] with confirmation received. Patient made aware.

## 2020-05-22 ENCOUNTER — Telehealth: Payer: Self-pay | Admitting: Physician Assistant

## 2020-05-22 NOTE — Telephone Encounter (Signed)
Call pt with labs.   Hemoglobin looks good.  Iron stores good.  Glucose elevated-were you fasting?  DHEA and testosterone low.

## 2020-05-22 NOTE — Telephone Encounter (Signed)
Pt returned a call back regarding lab results. Per pt, having concerns with hair loss. Wants to know whether low DHEA and testosterone may have something to due with the hair loss. Pt is requesting recommendation and/or medications to help reduce hair loss. [facility]. Pls advise, thanks.

## 2020-05-22 NOTE — Telephone Encounter (Signed)
Left message on machine for patient to call back.

## 2020-05-27 ENCOUNTER — Telehealth: Payer: Self-pay

## 2020-05-27 NOTE — Telephone Encounter (Signed)
See last note

## 2020-05-27 NOTE — Telephone Encounter (Signed)
Second call -  Pt called regarding severe hair loss. Per pt, having concerns with hair loss / abnormal labs. Wants to know whether low DHEA and testosterone may have something to due with the hair loss. Pt is requesting recommendation and/or medications to help reduce hair loss. [facility]. Pls advise, thanks.

## 2020-05-27 NOTE — Telephone Encounter (Signed)
If you had too much of these they could contribute to hair loss but not enough of them should not. Where is the hair loss distribution? Patches? Top of head? The most common type of hair loss is telogen effluvium(stress cyclical) or medication induced. Have you started any new medications? Have you tried biotin supplement?

## 2020-05-28 NOTE — Telephone Encounter (Signed)
Spoke with patient. She [place] She has just noticed an overall thinning of the hair, no patches. No changes in medications, but her MS infusions are done differently at the new hospital even though it is the same medication. She has tried Biotin but felt it didn't help. Vitamin E has seemed to help more so she is currently taking that. She wanted to know if she should be taking anything to help the low DHEA and testosterone levels?

## 2020-05-29 NOTE — Telephone Encounter (Signed)
Testosterone/DHEA is expected to decrease in menopause just like estrogen. Replacing it would be a version of hormone replacement. If you choose to replacement testosterone most women would replace all the hormones estrogen included. It does come with some risk of increased blood clots and breast cancer and mood changes(sometimes for the better).

## 2020-05-29 NOTE — Telephone Encounter (Signed)
Called patient back and she was made aware of recommendations. She will call back if she decides she would like medication.

## 2020-05-29 NOTE — Telephone Encounter (Signed)
It would likely not have any effect on hair.

## 2020-06-10 ENCOUNTER — Telehealth (HOSPITAL_COMMUNITY): Payer: Medicare Other | Admitting: Psychiatry

## 2020-06-24 ENCOUNTER — Other Ambulatory Visit (HOSPITAL_COMMUNITY): Payer: Self-pay | Admitting: Psychiatry

## 2020-07-24 ENCOUNTER — Other Ambulatory Visit (HOSPITAL_COMMUNITY): Payer: Self-pay | Admitting: Psychiatry

## 2020-09-04 ENCOUNTER — Telehealth (INDEPENDENT_AMBULATORY_CARE_PROVIDER_SITE_OTHER): Payer: Medicare Other | Admitting: Psychiatry

## 2020-09-04 ENCOUNTER — Encounter (HOSPITAL_COMMUNITY): Payer: Self-pay | Admitting: Psychiatry

## 2020-09-04 DIAGNOSIS — F411 Generalized anxiety disorder: Secondary | ICD-10-CM | POA: Diagnosis not present

## 2020-09-04 DIAGNOSIS — R21 Rash and other nonspecific skin eruption: Secondary | ICD-10-CM | POA: Diagnosis not present

## 2020-09-04 DIAGNOSIS — F4310 Post-traumatic stress disorder, unspecified: Secondary | ICD-10-CM | POA: Diagnosis not present

## 2020-09-04 DIAGNOSIS — F3181 Bipolar II disorder: Secondary | ICD-10-CM | POA: Diagnosis not present

## 2020-09-04 MED ORDER — HYDROXYZINE HCL 25 MG PO TABS: 25.0000 mg | ORAL_TABLET | Freq: Two times a day (BID) | ORAL | 0 refills | Status: DC | PRN

## 2020-09-04 MED ORDER — LAMOTRIGINE 200 MG PO TABS
ORAL_TABLET | ORAL | 1 refills | Status: DC
Start: 2020-09-04 — End: 2020-11-11

## 2020-09-04 NOTE — Progress Notes (Signed)
[facility] Outpatient Follow up visit   Patient Identification: Paige Lozano MRN:  [id number] Date of Evaluation:  09/04/2020 Referral Source: Luvenia Starch Primary care Chief Complaint:    bipolar follow up , depression Visit Diagnosis:    ICD-10-CM   1. Bipolar 2 disorder (HCC)  F31.81   2. PTSD (post-traumatic stress disorder)  F43.10   3. GAD (generalized anxiety disorder)  F41.1   4. Rash and nonspecific skin eruption  R21 hydrOXYzine (ATARAX/VISTARIL) 25 MG tablet     I connected with Paige Lozano on 09/04/20 at 10:30 AM EDT by a video enabled telemedicine application and verified that I am speaking with the correct person using two identifiers.    I discussed the limitations of evaluation and management by telemedicine and the availability of in person appointments. The patient expressed understanding and agreed to proceed. Patient location : home Provider location: home office lHistory of Present Illness:  54  years old Returns for follow-up and medication management    Feeling depressed, had a DUI have to choose for a rehab or detox program 7 days not to go to jail Step daughter age 63 died of possible OD, investigation is on for cause   Upset and stressed[place] Worried about coming knee surgery  States does not drink says it was mild alcohol   seroquel helps at night for sleep  Aggravating factor: surgeries . Multiple medical .surgeries, breakup, DUI Modifying factor: dogs   Duration more then 10 years     Previous Psychotropic Medications: Yes   Substance Abuse History in the last 12 months:  Yes.   as per history Marijuana says it is medical for her condition.  Consequences of Substance Abuse: Medical Consequences:  fatigue, poor concenctration  Past Medical History:  Past Medical History:  Diagnosis Date  . Anxiety   . Bipolar 2 disorder, major depressive episode ([place])   . Complication of anesthesia   . COPD (chronic  obstructive pulmonary disease) ([place])    smoker  . Fibromyalgia   . Insomnia   . Multiple sclerosis ([place])   . PONV (postoperative nausea and vomiting)   . Pulmonary embolism ([place]) 2000  . Sleep apnea    mild OSA no CPAP  . Ulcerative colitis [facility])     Past Surgical History:  Procedure Laterality Date  . FOOT SURGERY Left    bone spur  . HEMORRHOID SURGERY    . HUMERUS FRACTURE SURGERY Left   . MYOMECTOMY    . NASAL SINUS SURGERY    . SHOULDER ARTHROSCOPY WITH SUBACROMIAL DECOMPRESSION AND BICEP TENDON REPAIR Left 07/27/2016   Procedure: SHOULDER ARTHROSCOPY DEBRIDEMENT ROTATOR CUFF AND LABRUM, SUBACROMIAL DECOMPRESSION, BICEPS TENODESIS;  Surgeon: Tania Ade, MD;  Location: [place];  Service: Orthopedics;  Laterality: Left;  SHOULDER ARTHROSCOPY DEBRIDEMENT ROTATOR CUFF AND LABRUM, SUBACROMIAL DECOMPRESSION, BICEPS TENOTOMY, POSSIBLE TENODESIS  . THUMB ARTHROSCOPY     5 surgeries on left thumb, 4 surgeries on right  . TONSILLECTOMY AND ADENOIDECTOMY       Family History:  Family History  Adopted: Yes  Problem Relation Age of Onset  . Breast cancer Sister        Triple Negative    Social History:   Social History   Socioeconomic History  . Marital status: Divorced    Spouse name: Not on file  . Number of children: Not on file  . Years of education: Not on file  . Highest education level:  Not on file  Occupational History  . Not on file  Tobacco Use  . Smoking status: Former Smoker    Packs/day: 0.25    Types: Cigarettes    Quit date: 07/08/2016    Years since quitting: 4.1  . Smokeless tobacco: Never Used  Vaping Use  . Vaping Use: Never used  Substance and Sexual Activity  . Alcohol use: Yes    Alcohol/week: 0.0 standard drinks    Comment: rare   . Drug use: No    Types: Marijuana    Comment: last use 5 month ago  . Sexual activity: Yes    Partners: Male    Birth control/protection: None  Other Topics Concern  . Not on file   Social History Narrative  . Not on file   Social Determinants of Health   Financial Resource Strain:   . Difficulty of Paying Living Expenses: Not on file  Food Insecurity:   . Worried About Charity fundraiser in the Last Year: Not on file  . Ran Out of Food in the Last Year: Not on file  Transportation Needs:   . Lack of Transportation (Medical): Not on file  . Lack of Transportation (Non-Medical): Not on file  Physical Activity:   . Days of Exercise per Week: Not on file  . Minutes of Exercise per Session: Not on file  Stress:   . Feeling of Stress : Not on file  Social Connections:   . Frequency of Communication with Friends and Family: Not on file  . Frequency of Social Gatherings with Friends and Family: Not on file  . Attends Religious Services: Not on file  . Active Member of Clubs or Organizations: Not on file  . Attends Archivist Meetings: Not on file  . Marital Status: Not on file     Allergies:   Allergies  Allergen Reactions  . Ketamine Anaphylaxis  . Oxycodone Diarrhea  . Papaya Enzyme Anaphylaxis    Metabolic Disorder Labs: No results found for: HGBA1C, MPG No results found for: PROLACTIN No results found for: CHOL, TRIG, HDL, CHOLHDL, VLDL, LDLCALC   Current Medications: Current Outpatient Medications  Medication Sig Dispense Refill  . albuterol (PROVENTIL HFA;VENTOLIN HFA) 108 (90 Base) MCG/ACT inhaler Inhale 2 puffs into the lungs every 6 (six) hours as needed for wheezing or shortness of breath. 1 Inhaler 0  . Cholecalciferol (VITAMIN D-1000 MAX ST) 1000 units tablet Take by mouth.    . clonazePAM (KLONOPIN) 0.5 MG tablet Take 1 tablet (0.5 mg total) by mouth 2 (two) times daily as needed. 60 tablet 5  . fluticasone (FLONASE) 50 MCG/ACT nasal spray Place 2 sprays into both nostrils daily. 16 g 3  . hydrOXYzine (ATARAX/VISTARIL) 25 MG tablet Take 1 tablet (25 mg total) by mouth 2 (two) times daily as needed for itching. 60 tablet 0  .  ibuprofen (ADVIL) 800 MG tablet Take 800 mg by mouth as needed.    . lamoTRIgine (LAMICTAL) 200 MG tablet Take 200mg   BY MOUTH TWICE DAILY 60 tablet 1  . Multiple Vitamin (MULTI-VITAMINS) TABS Take by mouth.    . natalizumab (TYSABRI) 300 MG/15ML injection Inject into the vein.    Marland Kitchen QUEtiapine (SEROQUEL) 200 MG tablet Take 1 tablet (200 mg total) by mouth at bedtime. 30 tablet 5  . vitamin B-12 (CYANOCOBALAMIN) 1000 MCG tablet Take 1,000 mcg by mouth daily.    . vitamin E 1000 UNIT capsule Take 1,000 Units by mouth daily.  No current facility-administered medications for this visit.      Psychiatric Specialty Exam: Review of Systems  Cardiovascular: Negative for chest pain and palpitations.  Skin: Negative for rash.  Neurological: Negative for tremors.  Psychiatric/Behavioral: Positive for depression. Negative for suicidal ideas. The patient is nervous/anxious.     There were no vitals taken for this visit.There is no height or weight on file to calculate BMI.  General Appearance:   Eye Contact:    Speech:  Normal Rate  Volume:  normal  Mood:depressed  Affect:  congruent  Thought Process:  Goal Directed  Orientation:  Full (Time, Place, and Person)  Thought Content:  Logical  Suicidal Thoughts:  No  Homicidal Thoughts:  No  Memory:  Immediate;   Fair Recent;   Fair  Judgement:  Fair  Insight:  Fair  Psychomotor Activity:  Normal  Concentration:  Concentration: Fair and Attention Span: Fair  Recall:  [employer] of Knowledge:Fair  Language: Fair  Akathisia:  Negative  Handed:  Right  AIMS (if indicated):    Assets:  Desire for Improvement  ADL's:  Intact  Cognition: WNL  Sleep:  Fair while on meds    Treatment Plan Summary: Medication management and Plan as follows  Bipolar 2: depressed;depressed, increase lamictal to 200mg  bid, not suicidal, plans to join 7 day alcohol program Highly recommend and to consider therapy long term Continue seroquel. No  tremors GAD: anxious, re start vistaril or take upto bid  Consider therapy Insomnia: fluctuates, seroqeul helps, work on distraction from negative thoughts  I discussed the assessment and treatment plan with the patient. The patient was provided an opportunity to ask questions and all were answered. The patient agreed with the plan and demonstrated an understanding of the instructions.   The patient was advised to call back or seek an in-person evaluation if the symptoms worsen or if the condition fails to improve as anticipated. Time spent non face to face 20  min or less  Fu 3-4 weeks or earlier if symptoms worsen Merian Capron, MD 10/27/202110:51 AM

## 2020-09-12 ENCOUNTER — Telehealth (INDEPENDENT_AMBULATORY_CARE_PROVIDER_SITE_OTHER): Payer: Medicare Other | Admitting: Nurse Practitioner

## 2020-09-12 ENCOUNTER — Encounter: Payer: Self-pay | Admitting: Nurse Practitioner

## 2020-09-12 DIAGNOSIS — F430 Acute stress reaction: Secondary | ICD-10-CM | POA: Diagnosis not present

## 2020-09-12 DIAGNOSIS — F411 Generalized anxiety disorder: Secondary | ICD-10-CM

## 2020-09-12 MED ORDER — CLONAZEPAM 0.5 MG PO TABS: 0.5000 mg | ORAL_TABLET | Freq: Two times a day (BID) | ORAL | 2 refills | Status: DC | PRN

## 2020-09-12 NOTE — Patient Instructions (Addendum)
I am so sorry for your recent losses. I hope you have safe travels back to the [facility].    Please plan to follow-up with Jade in about 3 months.

## 2020-09-12 NOTE — Progress Notes (Signed)
Virtual Video Visit via MyChart Note  I connected with  Paige Lozano on 09/12/20 at  4:10 PM EDT by the video enabled telemedicine application for , MyChart, and verified that I am speaking with the correct person using two identifiers.   I introduced myself as a Designer, jewellery with the practice. We discussed the limitations of evaluation and management by telemedicine and the availability of in person appointments. The patient expressed understanding and agreed to proceed.  The patient is: at home (Paige Lozano, [place]) I am: in the office  Subjective:    CC: Anxiety  HPI: Paige Lozano is a 54 y.o. y/o female presenting via [place] today for anxiety and refills for clonazepam.    She tells me that she has recently experienced the death of her 4 year old step-daughter and her father and step-mother. All deaths were within the last 8 weeks and were unexpected.   She reports that she traveled to the [place] to pick up her 59 year old stepdaughter in September of this year.  Shortly before she was to return back to [place] she tells me her stepdaughter was found deceased from a reported homicide. She also tells me that this past weekend she was notified that her that her father and stepmother had both passed away unexpectedly in their home.   While on the [facility] for her step-daughter, she tells me she had her prescriptions transferred. She picked up one refill of the 6 for her clonazepam while there. When she went to the pharmacy when she went to refill her clonazepam in [place] she was told the remaining refills could not be transferred back to [place] due to it being a controlled substance.  She tells me that she does not typically take 2 clonazepam a day and can generally make the prescriptions last. She reports that she has been prescribed hydroxyzine by Dr. De Nurse and she has tried up to 75 mg of this medication, but it is not helpful for her to sleep or for  anxiety.   She endorses increased anxiety and difficulty sleeping. She denies thoughts of self harm.    Past medical history, Surgical history, Family history not pertinant except as noted below, Social history, Allergies, and medications have been entered into the medical record, reviewed, and corrections made.   Review of Systems:  See HPI for pertinent positive and negatives  Objective:    General: Speaking clearly in complete sentences without any shortness of breath. Alert and oriented x3. Normal mood. Normal judgment. No apparent acute distress.  Impression and Recommendations:    1. Anxiety state 2. GAD (generalized anxiety disorder) 3. Acute stress reaction Symptoms and presentation consistent with exacerbation of generalized anxiety disorder due to recent unexpected loss of close family members.  PDMP reviewed today- patient picked up the first of 7 clonazepam prescriptions written in 04/2020 in 06/2020. Unable to view refills picked up out of state. Patient reports that she picked up another prescription in [place] after they were transferred.  Based on PDMP review, it appears that the patient has not abused her medication and has made her prescriptions last for longer than the expected 30 days.  Will plan to refill clonazepam today with 2 additional refills.  Do not take this medication with other sedative medications such as hydroxyzine or alcohol.  Patient aware of recommended follow-up with PCP in 3 months.  Encouraged patient to reach out to psychiatrist or PCP for increasing anxiety.  - clonazePAM (  KLONOPIN) 0.5 MG tablet; Take 1 tablet (0.5 mg total) by mouth 2 (two) times daily as needed for anxiety.  Dispense: 60 tablet; Refill: 2      I discussed the assessment and treatment plan with the patient. The patient was provided an opportunity to ask questions and all were answered. The patient agreed with the plan and demonstrated an understanding of the instructions.    The patient was advised to call back or seek an in-person evaluation if the symptoms worsen or if the condition fails to improve as anticipated.  I provided 30 minutes of non-face-to-face interaction with this [place] visit including intake, same-day documentation, and chart review.   Orma Render, NP

## 2020-09-23 ENCOUNTER — Telehealth: Payer: Self-pay

## 2020-09-23 MED ORDER — ALBUTEROL SULFATE HFA 108 (90 BASE) MCG/ACT IN AERS: 2.0000 | INHALATION_SPRAY | Freq: Four times a day (QID) | RESPIRATORY_TRACT | 0 refills | Status: DC | PRN

## 2020-09-23 NOTE — Telephone Encounter (Signed)
Patient advised.

## 2020-09-23 NOTE — Telephone Encounter (Signed)
Patient called requesting an RX for albuterol be sent in to [place] at [facility]. She has not been given this since 2019 and it was written by Dr Georgina Snell. She states she has been having some shortness of breath, she thinks could be due to some panic attacks.

## 2020-09-23 NOTE — Telephone Encounter (Signed)
I sent albuterol inhaler but sOB due to anxiety albuterol is not going to help like it would due to inflammation and could make anxiety worse if using too much. You need to have appt to dicuss if this continues to be an issue.

## 2020-09-24 ENCOUNTER — Other Ambulatory Visit: Payer: Self-pay | Admitting: Physician Assistant

## 2020-09-24 NOTE — Telephone Encounter (Signed)
Last written for 1 pill at bedtime.  Refill came thru for 1.5 tablets at bedtime.  Read Sara's last note and note from behavior health and don't see where this was increased? Please advise.

## 2020-09-30 ENCOUNTER — Other Ambulatory Visit (HOSPITAL_COMMUNITY): Payer: Self-pay | Admitting: Psychiatry

## 2020-09-30 DIAGNOSIS — R21 Rash and other nonspecific skin eruption: Secondary | ICD-10-CM

## 2020-10-02 ENCOUNTER — Encounter (HOSPITAL_COMMUNITY): Payer: Self-pay | Admitting: Psychiatry

## 2020-10-02 ENCOUNTER — Telehealth (INDEPENDENT_AMBULATORY_CARE_PROVIDER_SITE_OTHER): Payer: Medicare Other | Admitting: Psychiatry

## 2020-10-02 DIAGNOSIS — F3181 Bipolar II disorder: Secondary | ICD-10-CM

## 2020-10-02 DIAGNOSIS — F411 Generalized anxiety disorder: Secondary | ICD-10-CM

## 2020-10-02 DIAGNOSIS — F5102 Adjustment insomnia: Secondary | ICD-10-CM

## 2020-10-02 NOTE — Progress Notes (Signed)
[facility] Outpatient Follow up visit   Patient Identification: Paige Lozano MRN:  [id number] Date of Evaluation:  10/02/2020 Referral Source: Luvenia Starch Primary care Chief Complaint:    bipolar follow up , depression Visit Diagnosis:    ICD-10-CM   1. Bipolar 2 disorder (HCC)  F31.81   2. GAD (generalized anxiety disorder)  F41.1   3. Adjustment insomnia  F51.02      I connected with Ebbie Sorenson on 10/02/20 at  1:00 PM EST by telephone and verified that I am speaking with the correct person using two identifiers.   I discussed the limitations of evaluation and management by telemedicine and the availability of in person appointments. The patient expressed understanding and agreed to proceed. Patient location : home Provider location: home office lHistory of Present Illness:  54  years old Returns for follow-up and medication management    Brief recent history  had a DUI have to choose for a rehab or detox program 7 days not to go to jail Step daughter age 22 died of possible OD, investigation is on for cause    Mind races at night, vistaril didn't help much, on klonopne takes prn, understands the sedative effect of both and risk Dwells on worries mind races at night and cant sleep Under treatment for MS a  Worried about coming knee surgery  States does not drink says it was mild alcohol   seroquel helps some  Aggravating factor: surgeries . Multiple medical .surgeries, breakup, DUI Modifying factor: dogs   Duration more then 10 years     Previous Psychotropic Medications: Yes   Substance Abuse History in the last 12 months:  Yes.   as per history Marijuana says it is medical for her condition.  Consequences of Substance Abuse: Medical Consequences:  fatigue, poor concenctration  Past Medical History:  Past Medical History:  Diagnosis Date  . Anxiety   . Bipolar 2 disorder, major depressive episode ([place])   . Complication of anesthesia   . COPD (chronic obstructive  pulmonary disease) ([place])    smoker  . Fibromyalgia   . Insomnia   . Multiple sclerosis ([place])   . PONV (postoperative nausea and vomiting)   . Pulmonary embolism ([place]) 2000  . Sleep apnea    mild OSA no CPAP  . Ulcerative colitis [facility])     Past Surgical History:  Procedure Laterality Date  . FOOT SURGERY Left    bone spur  . HEMORRHOID SURGERY    . HUMERUS FRACTURE SURGERY Left   . MYOMECTOMY    . NASAL SINUS SURGERY    . SHOULDER ARTHROSCOPY WITH SUBACROMIAL DECOMPRESSION AND BICEP TENDON REPAIR Left 07/27/2016   Procedure: SHOULDER ARTHROSCOPY DEBRIDEMENT ROTATOR CUFF AND LABRUM, SUBACROMIAL DECOMPRESSION, BICEPS TENODESIS;  Surgeon: Tania Ade, MD;  Location: [place];  Service: Orthopedics;  Laterality: Left;  SHOULDER ARTHROSCOPY DEBRIDEMENT ROTATOR CUFF AND LABRUM, SUBACROMIAL DECOMPRESSION, BICEPS TENOTOMY, POSSIBLE TENODESIS  . THUMB ARTHROSCOPY     5 surgeries on left thumb, 4 surgeries on right  . TONSILLECTOMY AND ADENOIDECTOMY       Family History:  Family History  Adopted: Yes  Problem Relation Age of Onset  . Breast cancer Sister        Triple Negative    Social History:   Social History   Socioeconomic History  . Marital status: Divorced    Spouse name: Not on file  . Number of children: Not on file  . Years of education: Not on file  .  Highest education level: Not on file  Occupational History  . Not on file  Tobacco Use  . Smoking status: Former Smoker    Packs/day: 0.25    Types: Cigarettes    Quit date: 07/08/2016    Years since quitting: 4.2  . Smokeless tobacco: Never Used  Vaping Use  . Vaping Use: Never used  Substance and Sexual Activity  . Alcohol use: Yes    Alcohol/week: 0.0 standard drinks    Comment: rare   . Drug use: No    Types: Marijuana    Comment: last use 5 month ago  . Sexual activity: Yes    Partners: Male    Birth control/protection: None  Other Topics Concern  . Not on file  Social History  Narrative  . Not on file   Social Determinants of Health   Financial Resource Strain:   . Difficulty of Paying Living Expenses: Not on file  Food Insecurity:   . Worried About Charity fundraiser in the Last Year: Not on file  . Ran Out of Food in the Last Year: Not on file  Transportation Needs:   . Lack of Transportation (Medical): Not on file  . Lack of Transportation (Non-Medical): Not on file  Physical Activity:   . Days of Exercise per Week: Not on file  . Minutes of Exercise per Session: Not on file  Stress:   . Feeling of Stress : Not on file  Social Connections:   . Frequency of Communication with Friends and Family: Not on file  . Frequency of Social Gatherings with Friends and Family: Not on file  . Attends Religious Services: Not on file  . Active Member of Clubs or Organizations: Not on file  . Attends Archivist Meetings: Not on file  . Marital Status: Not on file     Allergies:   Allergies  Allergen Reactions  . Ketamine Anaphylaxis  . Oxycodone Diarrhea  . Papaya Enzyme Anaphylaxis    Metabolic Disorder Labs: No results found for: HGBA1C, MPG No results found for: PROLACTIN No results found for: CHOL, TRIG, HDL, CHOLHDL, VLDL, LDLCALC   Current Medications: Current Outpatient Medications  Medication Sig Dispense Refill  . albuterol (VENTOLIN HFA) 108 (90 Base) MCG/ACT inhaler Inhale 2 puffs into the lungs every 6 (six) hours as needed for wheezing or shortness of breath. 1 each 0  . Cholecalciferol (VITAMIN D-1000 MAX ST) 1000 units tablet Take by mouth.    . clonazePAM (KLONOPIN) 0.5 MG tablet Take 1 tablet (0.5 mg total) by mouth 2 (two) times daily as needed for anxiety. 60 tablet 2  . fluticasone (FLONASE) 50 MCG/ACT nasal spray Place 2 sprays into both nostrils daily. 16 g 3  . hydrOXYzine (ATARAX/VISTARIL) 25 MG tablet TAKE 1 TABLET(25 MG) BY MOUTH TWICE DAILY AS NEEDED FOR ITCHING 60 tablet 0  . ibuprofen (ADVIL) 800 MG tablet Take  800 mg by mouth as needed.    . lamoTRIgine (LAMICTAL) 200 MG tablet Take 200mg   BY MOUTH TWICE DAILY 60 tablet 1  . Multiple Vitamin (MULTI-VITAMINS) TABS Take by mouth.    . natalizumab (TYSABRI) 300 MG/15ML injection Inject into the vein.    Marland Kitchen QUEtiapine (SEROQUEL) 200 MG tablet Take 1 tablet (200 mg total) by mouth at bedtime. 30 tablet 5  . vitamin B-12 (CYANOCOBALAMIN) 1000 MCG tablet Take 1,000 mcg by mouth daily.    . vitamin E 1000 UNIT capsule Take 1,000 Units by mouth daily.  No current facility-administered medications for this visit.      Psychiatric Specialty Exam: Review of Systems  Cardiovascular: Negative for chest pain and palpitations.  Skin: Negative for rash.  Psychiatric/Behavioral: Negative for suicidal ideas. The patient is nervous/anxious.     There were no vitals taken for this visit.There is no height or weight on file to calculate BMI.  General Appearance:   Eye Contact:    Speech:  Normal Rate  Volume:  normal  Mood: stress  Affect:  congruent  Thought Process:  Goal Directed  Orientation:  Full (Time, Place, and Person)  Thought Content:  Logical  Suicidal Thoughts:  No  Homicidal Thoughts:  No  Memory:  Immediate;   Fair Recent;   Fair  Judgement:  Fair  Insight:  Fair  Psychomotor Activity:  Normal  Concentration:  Concentration: Fair and Attention Span: Fair  Recall:  [employer] of Knowledge:Fair  Language: Fair  Akathisia:  Negative  Handed:  Right  AIMS (if indicated):    Assets:  Desire for Improvement  ADL's:  Intact  Cognition: WNL  Sleep:  Fair while on meds    Treatment Plan Summary: Medication management and Plan as follows  Bipolar 2: depressed;depressed, subdued, stress, increase seroquel to 300mg , has 200mg  for now can take one and half of them at night Highly recommend and to consider therapy long term  GAD: stressed, currently on klonopine and vistaril Consider therapy Insomnia: fluctuates, increase seroquel to  300mg    I discussed the assessment and treatment plan with the patient. The patient was provided an opportunity to ask questions and all were answered. The patient agreed with the plan and demonstrated an understanding of the instructions.   The patient was advised to call back or seek an in-person evaluation if the symptoms worsen or if the condition fails to improve as anticipated. Time spent non face to face 15  min  Fu 5 -5 weeks or earlier if symptoms worsen Merian Capron, MD 11/24/20211:13 PM

## 2020-10-15 ENCOUNTER — Other Ambulatory Visit: Payer: Self-pay | Admitting: Physician Assistant

## 2020-10-29 ENCOUNTER — Telehealth: Payer: Self-pay | Admitting: Neurology

## 2020-10-29 NOTE — Telephone Encounter (Signed)
Received request from [facility] for surgical clearance.   Patient needs appt with Rumi Taras. Please call to schedule.

## 2020-11-05 ENCOUNTER — Other Ambulatory Visit (HOSPITAL_COMMUNITY): Payer: Self-pay | Admitting: Psychiatry

## 2020-11-05 NOTE — Telephone Encounter (Signed)
Mychart video appointment scheduled for tomorrow.

## 2020-11-06 ENCOUNTER — Encounter: Payer: Self-pay | Admitting: Physician Assistant

## 2020-11-06 ENCOUNTER — Encounter: Payer: Medicare Other | Admitting: Physician Assistant

## 2020-11-11 NOTE — Progress Notes (Signed)
Error note

## 2020-11-13 ENCOUNTER — Other Ambulatory Visit (HOSPITAL_COMMUNITY): Payer: Self-pay

## 2020-11-13 DIAGNOSIS — F5101 Primary insomnia: Secondary | ICD-10-CM

## 2020-11-13 DIAGNOSIS — R21 Rash and other nonspecific skin eruption: Secondary | ICD-10-CM

## 2020-11-13 MED ORDER — QUETIAPINE FUMARATE 200 MG PO TABS: 300.0000 mg | ORAL_TABLET | Freq: Every day | ORAL | 1 refills | Status: DC

## 2020-11-13 MED ORDER — HYDROXYZINE HCL 25 MG PO TABS: ORAL_TABLET | ORAL | 0 refills | Status: DC

## 2020-11-14 ENCOUNTER — Telehealth (HOSPITAL_COMMUNITY): Payer: Medicare Other | Admitting: Psychiatry

## 2020-11-27 ENCOUNTER — Encounter: Payer: Self-pay | Admitting: Physician Assistant

## 2020-11-27 ENCOUNTER — Other Ambulatory Visit: Payer: Self-pay

## 2020-11-27 ENCOUNTER — Ambulatory Visit (INDEPENDENT_AMBULATORY_CARE_PROVIDER_SITE_OTHER): Payer: Medicare PPO | Admitting: Physician Assistant

## 2020-11-27 ENCOUNTER — Ambulatory Visit (INDEPENDENT_AMBULATORY_CARE_PROVIDER_SITE_OTHER): Payer: Medicare PPO

## 2020-11-27 VITALS — BP 135/84 | HR 88 | Wt 174.9 lb

## 2020-11-27 DIAGNOSIS — R9431 Abnormal electrocardiogram [ECG] [EKG]: Secondary | ICD-10-CM

## 2020-11-27 DIAGNOSIS — R0602 Shortness of breath: Secondary | ICD-10-CM

## 2020-11-27 DIAGNOSIS — Z01818 Encounter for other preprocedural examination: Secondary | ICD-10-CM | POA: Diagnosis not present

## 2020-11-27 DIAGNOSIS — R0989 Other specified symptoms and signs involving the circulatory and respiratory systems: Secondary | ICD-10-CM | POA: Diagnosis not present

## 2020-11-27 DIAGNOSIS — I4510 Unspecified right bundle-branch block: Secondary | ICD-10-CM | POA: Diagnosis not present

## 2020-11-27 NOTE — Progress Notes (Signed)
l °

## 2020-11-27 NOTE — Progress Notes (Signed)
Subjective:    Patient ID: Paige Lozano, female    DOB: 02-12-1966, 55 y.o.   MRN: [id number]  HPI  Pt is a 55 yo female who presents to the clinic for pre-op clearance for right total knee replacement surgery.   Pt denies any concerns or complaints related to having surgery.   Overall she is having some episodes of "air hunger" or "SOB" that she feels like she cannot get a good breath of air in and she gasp at times. She is not active due to her right knee pain. She was told when she was smoking he had COPD but not on any inhalers other than her albuterol as needed. She uses it at times and does seem to help. No chronic or productive cough. No CP or palpitations. Hx of PE many years ago. No problems since.    .. Active Ambulatory Problems    Diagnosis Date Noted  . Multiple sclerosis ([place]) 05/29/2016  . Fibromyalgia 05/29/2016  . 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 Insomnia 05/29/2016  . Ulcerative pancolitis without complication ([place]) 11/17/3233  . Bipolar 2 disorder ([place]) 05/29/2016  . Current smoker 05/29/2016  . Primary osteoarthritis of right knee with recent ACL tear and femoral condylar impaction fracture 06/01/2016  . Anxiety state 06/01/2016  . Left shoulder pain 06/01/2016  . GAD (generalized anxiety disorder) 07/20/2016  . History of pulmonary embolus (PE) 07/21/2016  . Right elbow pain 07/21/2016  . Hot flashes 12/29/2016  . Body aches 12/29/2016  . Pruritus 12/29/2016  . Palpitations 03/08/2017  . Chest tightness 03/08/2017  . Tachycardia 03/08/2017  . Sinus tachycardia 03/12/2017  . PAC (premature atrial contraction) 03/12/2017  . Lumbar degenerative disc disease 06/02/2017  . Plantar fasciitis, bilateral 10/29/2017  . Onychomycosis 02/17/2018  . 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 Subcutaneous mass 04/07/2018  . Family history of breast cancer 12/02/2018  . Acute stress reaction 03/13/2019  . Fracture of second and third metatarsals of the right foot  07/14/2019  . Left wrist injury 07/31/2019  . Thyroid disorder screen 09/04/2019  . Actinic keratoses 09/04/2019  . Abrasion 11/22/2019  . History of lipoma 11/22/2019  . Air hunger 11/28/2020  . SOB (shortness of breath) 11/28/2020  . Right bundle branch block 11/28/2020   Resolved Ambulatory Problems    Diagnosis Date Noted  . Right lateral epicondylitis 07/21/2016  . Sinusitis 11/30/2016  . Right wrist injury 12/29/2016  . Hand injury, left, subsequent encounter 12/30/2016  . Chills (without fever) 03/08/2017   Past Medical History:  Diagnosis Date  . Anxiety   . Bipolar 2 disorder, major depressive episode ([place])   . Complication of anesthesia   . COPD (chronic obstructive pulmonary disease) ([place])   . PONV (postoperative nausea and vomiting)   . Pulmonary embolism ([place]) 2000  . Sleep apnea   . Ulcerative colitis ([place])     Review of Systems  All other systems reviewed and are negative.      Objective:   Physical Exam Vitals reviewed.  Constitutional:      Appearance: Normal appearance.  HENT:     Nose: Nose normal.  Eyes:     Conjunctiva/sclera: Conjunctivae normal.  Cardiovascular:     Rate and Rhythm: Regular rhythm. Tachycardia present.     Pulses: Normal pulses.     Heart sounds: Normal heart sounds.  Pulmonary:     Effort: Pulmonary effort is normal.     Breath sounds: Normal breath sounds. No wheezing or rhonchi.  Abdominal:  General: There is no distension.     Palpations: Abdomen is soft.     Tenderness: There is no abdominal tenderness.  Musculoskeletal:     Cervical back: Normal range of motion and neck supple.     Right lower leg: No edema.     Left lower leg: No edema.     Comments: No calf swelling, pain, warmth, or tenderness.   Lymphadenopathy:     Cervical: No cervical adenopathy.  Neurological:     General: No focal deficit present.     Mental Status: She is alert and oriented to person, place, and time.  Psychiatric:        Mood  and Affect: Mood normal.           Assessment & Plan:  Marland KitchenMarland KitchenDiagnoses and all orders for this visit:  Pre-op examination -     CBC with Differential/Platelet -     COMPLETE METABOLIC PANEL WITH GFR -     APTT -     DG Chest 2 View -     TSH -     EKG 12-Lead  Air hunger -     DG Chest 2 View -     EKG 12-Lead  SOB (shortness of breath) -     DG Chest 2 View -     EKG 12-Lead  Right bundle branch block -     CBC with Differential/Platelet -     COMPLETE METABOLIC PANEL WITH GFR -     APTT -     TSH -     EKG 12-Lead  Pre-op lab ordered.   Unfortunately today her EKG shows some changes from 2017 and I would like cardiology to give full cardiac clearance. I will make referral. Pt is really wanting surgery as soon as possible and told I will put in urgently.   2017 no sign of block, 2020 incomplete RBBB, 2022 RBBB.  Will get CXR.  No signs of PE or urgent cause of RBBB.   Dr. Linna Darner [phone] fax To send letter for partial clearance pending cardiac clearance to ortho.

## 2020-11-28 ENCOUNTER — Encounter: Payer: Self-pay | Admitting: Physician Assistant

## 2020-11-28 DIAGNOSIS — I4510 Unspecified right bundle-branch block: Secondary | ICD-10-CM | POA: Insufficient documentation

## 2020-11-28 DIAGNOSIS — R0602 Shortness of breath: Secondary | ICD-10-CM | POA: Insufficient documentation

## 2020-11-28 DIAGNOSIS — R0989 Other specified symptoms and signs involving the circulatory and respiratory systems: Secondary | ICD-10-CM | POA: Insufficient documentation

## 2020-11-28 LAB — COMPLETE METABOLIC PANEL WITH GFR
AG Ratio: 2.5 (calc) (ref 1.0–2.5)
ALT: 12 U/L (ref 6–29)
AST: 17 U/L (ref 10–35)
Albumin: 4.9 g/dL (ref 3.6–5.1)
Alkaline phosphatase (APISO): 86 U/L (ref 37–153)
BUN: 14 mg/dL (ref 7–25)
CO2: 24 mmol/L (ref 20–32)
Calcium: 10.2 mg/dL (ref 8.6–10.4)
Chloride: 104 mmol/L (ref 98–110)
Creat: 0.87 mg/dL (ref 0.50–1.05)
GFR, Est African American: 88 mL/min/{1.73_m2} (ref >=60)
GFR, Est Non African American: 76 mL/min/{1.73_m2} (ref >=60)
Globulin: 2 g/dL (calc) (ref 1.9–3.7)
Glucose, Bld: 83 mg/dL (ref 65–139)
Potassium: 4 mmol/L (ref 3.5–5.3)
Sodium: 141 mmol/L (ref 135–146)
Total Bilirubin: 0.4 mg/dL (ref 0.2–1.2)
Total Protein: 6.9 g/dL (ref 6.1–8.1)

## 2020-11-28 LAB — CBC WITH DIFFERENTIAL/PLATELET
Absolute Monocytes: 413 cells/uL (ref 200–950)
Basophils Absolute: 53 cells/uL (ref 0–200)
Basophils Relative: 0.7 %
Eosinophils Absolute: 285 cells/uL (ref 15–500)
Eosinophils Relative: 3.8 %
HCT: 40.3 % (ref 35.0–45.0)
Hemoglobin: 13.9 g/dL (ref 11.7–15.5)
Lymphs Abs: 3758 cells/uL (ref 850–3900)
MCH: 31 pg (ref 27.0–33.0)
MCHC: 34.5 g/dL (ref 32.0–36.0)
MCV: 90 fL (ref 80.0–100.0)
MPV: 10.8 fL (ref 7.5–12.5)
Monocytes Relative: 5.5 %
Neutro Abs: 2993 cells/uL (ref 1500–7800)
Neutrophils Relative %: 39.9 %
Platelets: 244 10*3/uL (ref 140–400)
RBC: 4.48 10*6/uL (ref 3.80–5.10)
RDW: 14.1 % (ref 11.0–15.0)
Total Lymphocyte: 50.1 %
WBC: 7.5 10*3/uL (ref 3.8–10.8)

## 2020-11-28 LAB — TSH: TSH: 0.72 mIU/L

## 2020-11-28 LAB — APTT: aPTT: 24 s (ref 23–32)

## 2020-11-28 NOTE — Progress Notes (Signed)
Labs look good. I will get that note to ortho for partial clearance.

## 2020-11-29 NOTE — Progress Notes (Signed)
Normal CXR

## 2020-12-03 DIAGNOSIS — T8859XA Other complications of anesthesia, initial encounter: Secondary | ICD-10-CM | POA: Insufficient documentation

## 2020-12-03 DIAGNOSIS — R112 Nausea with vomiting, unspecified: Secondary | ICD-10-CM | POA: Insufficient documentation

## 2020-12-03 DIAGNOSIS — F3181 Bipolar II disorder: Secondary | ICD-10-CM | POA: Insufficient documentation

## 2020-12-03 DIAGNOSIS — K5190 Ulcerative colitis, unspecified, without complications: Secondary | ICD-10-CM | POA: Insufficient documentation

## 2020-12-03 DIAGNOSIS — Z9889 Other specified postprocedural states: Secondary | ICD-10-CM | POA: Insufficient documentation

## 2020-12-03 DIAGNOSIS — J449 Chronic obstructive pulmonary disease, unspecified: Secondary | ICD-10-CM | POA: Insufficient documentation

## 2020-12-03 DIAGNOSIS — F419 Anxiety disorder, unspecified: Secondary | ICD-10-CM | POA: Insufficient documentation

## 2020-12-03 DIAGNOSIS — G4730 Sleep apnea, unspecified: Secondary | ICD-10-CM | POA: Insufficient documentation

## 2020-12-08 ENCOUNTER — Other Ambulatory Visit (HOSPITAL_COMMUNITY): Payer: Self-pay | Admitting: Psychiatry

## 2020-12-08 DIAGNOSIS — R21 Rash and other nonspecific skin eruption: Secondary | ICD-10-CM

## 2020-12-09 NOTE — Progress Notes (Addendum)
Cardiology Office Note:    Date:  12/10/2020   ID:  Paige Lozano, DOB [birth year], MRN [id number]  PCP:  Donella Stade, PA-C  Cardiologist:  Shirlee More, MD   Referring MD: Donella Stade, PA-C  ASSESSMENT:    1. Shortness of breath   2. Preoperative cardiovascular examination   3. Right bundle branch block   4. Chronic obstructive pulmonary disease, unspecified COPD type ([place])   5. History of pulmonary embolism    PLAN:    In order of problems listed above:  1. My office evaluation and testing to date there is no obvious etiology.  Differential diagnosis includes sequelae of chronic cigarette smoking COPD, sequelae of pulmonary embolism recurrent pulmonary embolism or heart failure although she has no physical findings of volume overload neck vein distention gallop.  I advised her to have lab work done particularly a proBNP level to direct [place] towards her against heart failure D-dimer and with her high risk with previous pulmonary embolism and shortness of breath to undergo a CTA.  [place] conflicts in her schedule and to accommodate her work I do her best to the echo in our office today CTA at the hospital.  If the studies did not show pulmonary embolism or cardiac source of shortness of breath I think it be best to direct her back to her primary care physician further evaluation could include a pulmonary evaluation full PFTs.  She acknowledges that she has a great deal of stress and anxiety and wonders if it is related to her symptoms.  It is conceivable. 2. I will await the results of the above testing and from a cardiology perspective they are reassuring and I think from a cardiology perspective she is ready for anticipated total joint surgery but would need further pulmonary evaluation 3. Stable EKG pattern in general unassociated with cardiomyopathy 4. Likely may well be the source of shortness of breath  especially she finds relief with bronchodilator 5. At high risk for recurrent pulmonary embolism check CTA today shows normal renal function  Next appointment be determined depending on results if cardiac testing is normal will refer back to her PCP  She has called my office with a question of whether she is cleared for planned orthopedic surgery from a cardiology perspective. The answer is yes She still has additional evaluation needs to be done for shortness of breath and is pending chest CTA for pulmonary venous thromboembolism through her primary care physician.   Medication Adjustments/Labs and Tests Ordered: Current medicines are reviewed at length with the patient today.  Concerns regarding medicines are outlined above.  Orders Placed This Encounter  Procedures  . CT ANGIO CHEST PE W OR WO CONTRAST  . Pro b natriuretic peptide (BNP)  . Comprehensive metabolic panel  . CBC  . D-dimer, quantitative (not at [facility])  . EKG 12-Lead  . ECHOCARDIOGRAM COMPLETE   No orders of the defined types were placed in this encounter.    Chief complaint: I been short of breath since September.  History of Present Illness:    Paige Lozano is a 55 y.o. female who is being seen today for preoperative cardiology evaluation with right total knee arthroplasty at the request of Breeback, [place], PA-C.  This is a very complex issue with her ED visit yesterday and her anticipated but unscheduled total knee arthroplasty. She has a history of COPD with a 20-pack-year or  less smoking history. She uses a bronchodilator and feels improved She relates a history of venous thromboembolism after thyroid surgery in the early 2000's treated with 6 months of anticoagulant.  She has had no recurrent venous thromboembolism. She has no known history of heart disease congenital rheumatic or heart murmur. There is no diagnosis of heart failure and she has not been treated with diuretics. She thought she had a CTA of  the chest done yesterday left the room went back out and reviewed her records again and it was not done she was seen in the ED as a code stroke. She had a great deal of trauma with deaths in her family and since her daughter died in August 11, 2023 she finds her self short of breath it occurs at rest when she casts she finds her self short of breath with activities light housework walking the dog but it does not force her to stop and she is not having edema orthopnea or PND.  There is no chest pain palpitation or syncope.  As I mentioned she is better with a bronchodilator but does not cough or wheeze.  On evaluation 11/27/2020 complaint of dyspnea and noted on EKG to have sinus rhythm right bundle branch block and repolarization changes. She had a chest x-ray performed 11/27/2020 which showed normal lungs and cardiovascular structures  She was seen [facility] health ED yesterday for right face neck and arm painful paresthesia with a history of underlying multiple sclerosis.  Chart review relates a history of pulmonary embolism in 2005.  CT of the head was performed with no acute intracranial abnormality she is sent by neurology with recommendations to have an MRI performed performed showing sinus rhythm right bundle branch block.  CMP normal except for glucose of 127 creatinine 0.9 GFR 84 cc/min CBC was normal hemoglobin 13.2.  She did have an MRI performed which showed atrophy moderate white matter changes consistent with chronic small vessel ischemic disease and no acute abnormality.  Revised cardiac risk index is 0 class I with a low risk of cardiac perioperative complications 3.7%.  Chart review: Holter monitor 03/09/2017 showed sinus rhythm and sinus tachycardia with a few atrial premature beats. She had a treadmill exercise stress test performed 03/09/2017 there is no arrhythmia during stress blood pressure response was normal patient achieved 85% of maximum predicted heart rate with a normal exercise  tolerance greater than 7 METS Past Medical History:  Diagnosis Date  . Anxiety   . Bipolar 2 disorder, major depressive episode ([place])   . Complication of anesthesia   . COPD (chronic obstructive pulmonary disease) ([place])    smoker  . Fibromyalgia   . Insomnia   . Multiple sclerosis ([place])   . PONV (postoperative nausea and vomiting)   . Pulmonary embolism ([place]) 2000  . Sleep apnea    mild OSA no CPAP  . Ulcerative colitis [facility])     Past Surgical History:  Procedure Laterality Date  . FOOT SURGERY Left    bone spur  . HEMORRHOID SURGERY    . HUMERUS FRACTURE SURGERY Left   . MYOMECTOMY    . NASAL SINUS SURGERY    . SHOULDER ARTHROSCOPY WITH SUBACROMIAL DECOMPRESSION AND BICEP TENDON REPAIR Left 07/27/2016   Procedure: SHOULDER ARTHROSCOPY DEBRIDEMENT ROTATOR CUFF AND LABRUM, SUBACROMIAL DECOMPRESSION, BICEPS TENODESIS;  Surgeon: Tania Ade, MD;  Location: [facility];  Service: Orthopedics;  Laterality: Left;  SHOULDER ARTHROSCOPY DEBRIDEMENT ROTATOR CUFF AND LABRUM, SUBACROMIAL DECOMPRESSION, BICEPS TENOTOMY, POSSIBLE TENODESIS  .  THUMB ARTHROSCOPY     5 surgeries on left thumb, 4 surgeries on right  . TONSILLECTOMY AND ADENOIDECTOMY      Current Medications: Current Meds  Medication Sig  . albuterol (VENTOLIN HFA) 108 (90 Base) MCG/ACT inhaler INHALE 2 PUFFS EVERY 6 HOURS AS NEEDED FOR WHEEZING OR SHORTNESS OF BREATH  . Biotin 10 MG CAPS Take by mouth daily.  . calcium carbonate (OSCAL) 1500 (600 Ca) MG TABS tablet Take 600 mg by mouth in the morning and at bedtime.  . Cholecalciferol 25 MCG (1000 UT) tablet Take by mouth.  . ferrous sulfate 325 (65 FE) MG tablet Take 1 tablet by mouth daily.  . fluticasone (FLONASE) 50 MCG/ACT nasal spray Place 2 sprays into both nostrils daily.  Marland Kitchen ibuprofen (ADVIL) 800 MG tablet Take 800 mg by mouth as needed.  . lamoTRIgine (LAMICTAL) 200 MG tablet TAKE 1 TABLET BY MOUTH TWICE DAILY  . methocarbamol (ROBAXIN) 500 MG  tablet Take 1 tablet by mouth every 6 (six) hours as needed.  . natalizumab (TYSABRI) 300 MG/15ML injection Inject into the vein.  Marland Kitchen QUEtiapine (SEROQUEL) 200 MG tablet Take 1.5 tablets (300 mg total) by mouth at bedtime.  . vitamin B-12 (CYANOCOBALAMIN) 1000 MCG tablet Take 1,000 mcg by mouth daily.  . vitamin E 1000 UNIT capsule Take 1,000 Units by mouth daily.     Allergies:   Ketamine, Oxycodone, Papaya enzyme, and Digestive enzymes   Social History   Socioeconomic History  . Marital status: Divorced    Spouse name: Not on file  . Number of children: Not on file  . Years of education: Not on file  . Highest education level: Not on file  Occupational History  . Not on file  Tobacco Use  . Smoking status: Former Smoker    Packs/day: 0.25    Types: Cigarettes    Quit date: 07/08/2016    Years since quitting: 4.4  . Smokeless tobacco: Never Used  Vaping Use  . Vaping Use: Never used  Substance and Sexual Activity  . Alcohol use: Yes    Alcohol/week: 0.0 standard drinks    Comment: rare   . Drug use: No    Types: Marijuana    Comment: last use 5 month ago  . Sexual activity: Yes    Partners: Male    Birth control/protection: None  Other Topics Concern  . Not on file  Social History Narrative  . Not on file   Social Determinants of Health   Financial Resource Strain: Not on file  Food Insecurity: Not on file  Transportation Needs: Not on file  Physical Activity: Not on file  Stress: Not on file  Social Connections: Not on file     Family History: The patient's family history includes Breast cancer in her sister. She was adopted.  ROS:   ROS Please see the history of present illness.     All other systems reviewed and are negative.  EKGs/Labs/Other Studies Reviewed:    The following studies were reviewed today:   EKG:  EKG is  ordered today.  The ekg ordered today is personally reviewed and demonstrates right bundle branch block left axis  deviation  Recent Labs: 11/27/2020: ALT 12; BUN 14; Creat 0.87; Hemoglobin 13.9; Platelets 244; Potassium 4.0; Sodium 141; TSH 0.72  Recent Lipid Panel 10/04/2018 lipid profile shows a cholesterol 196 LDL 129 triglycerides 206 HDL 49 non-HDL cholesterol 147  Physical Exam:    VS:  BP 100/70   Pulse Marland Kitchen)  102   Ht 5' 8.5" (1.74 m)   Wt 174 lb 6.4 oz (79.1 kg)   SpO2 98%   BMI 26.13 kg/m     Wt Readings from Last 3 Encounters:  12/10/20 174 lb 6.4 oz (79.1 kg)  11/27/20 174 lb 14.4 oz (79.3 kg)  04/22/20 167 lb (75.8 kg)     GEN: Intermittently stops and takes a deep breath telling me this is the symptoms that she is aware of well nourished, well developed in no acute distress HEENT: Normal NECK: No JVD; No carotid bruits LYMPHATICS: No lymphadenopathy CARDIAC: RRR, no murmurs, rubs, gallops RESPIRATORY:  Clear to auscultation without rales, wheezing or rhonchi  ABDOMEN: Soft, non-tender, non-distended MUSCULOSKELETAL:  No edema; No deformity  SKIN: Warm and dry NEUROLOGIC:  Alert and oriented x 3 PSYCHIATRIC:  Normal affect     Signed, Shirlee More, MD  12/10/2020 10:25 AM    [facility]

## 2020-12-10 ENCOUNTER — Other Ambulatory Visit: Payer: Self-pay

## 2020-12-10 ENCOUNTER — Ambulatory Visit (INDEPENDENT_AMBULATORY_CARE_PROVIDER_SITE_OTHER): Payer: Medicare PPO | Admitting: Cardiology

## 2020-12-10 ENCOUNTER — Encounter: Payer: Self-pay | Admitting: Cardiology

## 2020-12-10 ENCOUNTER — Ambulatory Visit (INDEPENDENT_AMBULATORY_CARE_PROVIDER_SITE_OTHER): Payer: Medicare PPO

## 2020-12-10 VITALS — BP 100/70 | HR 102 | Ht 68.5 in | Wt 174.4 lb

## 2020-12-10 DIAGNOSIS — Z01810 Encounter for preprocedural cardiovascular examination: Secondary | ICD-10-CM | POA: Diagnosis not present

## 2020-12-10 DIAGNOSIS — J449 Chronic obstructive pulmonary disease, unspecified: Secondary | ICD-10-CM | POA: Diagnosis not present

## 2020-12-10 DIAGNOSIS — I4510 Unspecified right bundle-branch block: Secondary | ICD-10-CM | POA: Diagnosis not present

## 2020-12-10 DIAGNOSIS — Z86711 Personal history of pulmonary embolism: Secondary | ICD-10-CM | POA: Diagnosis not present

## 2020-12-10 DIAGNOSIS — R0602 Shortness of breath: Secondary | ICD-10-CM

## 2020-12-10 LAB — CBC
Hematocrit: 42.6 % (ref 34.0–46.6)
Hemoglobin: 14.6 g/dL (ref 11.1–15.9)
MCH: 30.7 pg (ref 26.6–33.0)
MCHC: 34.3 g/dL (ref 31.5–35.7)
MCV: 90 fL (ref 79–97)
Platelets: 214 10*3/uL (ref 150–450)
RBC: 4.76 x10E6/uL (ref 3.77–5.28)
RDW: 14.1 % (ref 11.7–15.4)
WBC: 9.4 10*3/uL (ref 3.4–10.8)

## 2020-12-10 LAB — COMPREHENSIVE METABOLIC PANEL
ALT: 12 IU/L (ref 0–32)
AST: 18 IU/L (ref 0–40)
Albumin/Globulin Ratio: 2.7 — ABNORMAL HIGH (ref 1.2–2.2)
Albumin: 4.8 g/dL (ref 3.8–4.9)
Alkaline Phosphatase: 98 IU/L (ref 44–121)
BUN/Creatinine Ratio: 11 (ref 9–23)
BUN: 11 mg/dL (ref 6–24)
Bilirubin Total: 0.3 mg/dL (ref 0.0–1.2)
CO2: 22 mmol/L (ref 20–29)
Calcium: 9.9 mg/dL (ref 8.7–10.2)
Chloride: 101 mmol/L (ref 96–106)
Creatinine, Ser: 0.99 mg/dL (ref 0.57–1.00)
GFR calc Af Amer: 75 mL/min/{1.73_m2} (ref >59)
GFR calc non Af Amer: 65 mL/min/{1.73_m2} (ref >59)
Globulin, Total: 1.8 g/dL (ref 1.5–4.5)
Glucose: 94 mg/dL (ref 65–99)
Potassium: 4.1 mmol/L (ref 3.5–5.2)
Sodium: 140 mmol/L (ref 134–144)
Total Protein: 6.6 g/dL (ref 6.0–8.5)

## 2020-12-10 LAB — D-DIMER, QUANTITATIVE: D-DIMER: 0.26 mg/L FEU (ref 0.00–0.49)

## 2020-12-10 LAB — PRO B NATRIURETIC PEPTIDE: NT-Pro BNP: 16 pg/mL (ref 0–249)

## 2020-12-10 NOTE — Progress Notes (Signed)
Complete echocardiogram performed.  Jimmy Manus Weedman RDCS, RVT  

## 2020-12-10 NOTE — Patient Instructions (Addendum)
Medication Instructions:  Your physician recommends that you continue on your current medications as directed. Please refer to the Current Medication list given to you today.  *If you need a refill on your cardiac medications before your next appointment, please call your pharmacy*   Lab Work: Your physician recommends that you return for lab work in: TODAY D-dimer, ProBNP, CBC, CMP If you have labs (blood work) drawn today and your tests are completely normal, you will receive your results only by: Marland Kitchen MyChart Message (if you have MyChart) OR . A paper copy in the mail If you have any lab test that is abnormal or we need to change your treatment, we will call you to review the results.   Testing/Procedures: Your physician has requested that you have an echocardiogram. Echocardiography is a painless test that uses sound waves to create images of your heart. It provides your doctor with information about the size and shape of your heart and how well your heart's chambers and valves are working. This procedure takes approximately one hour. There are no restrictions for this procedure.  CT-scan of the chest has been ordered at [facility]. Please go there as soon as you are done with your echo and they will get this done for you.    Follow-Up: At [facility], you and your health needs are our priority.  As part of our continuing mission to provide you with exceptional heart care, we have created designated Provider Care Teams.  These Care Teams include your primary Cardiologist (physician) and Advanced Practice Providers (APPs -  Physician Assistants and Nurse Practitioners) who all work together to provide you with the care you need, when you need it.  We recommend signing up for the patient portal called "MyChart".  Sign up information is provided on this After Visit Summary.  MyChart is used to connect with patients for Virtual Visits (Telemedicine).  Patients are able to view lab/test  results, encounter notes, upcoming appointments, etc.  Non-urgent messages can be sent to your provider as well.   To learn more about what you can do with MyChart, go to [email or web address].    Your next appointment:   As needed/Based off of test results  The format for your next appointment:   In Person  Provider:   Shirlee More, MD   Other Instructions

## 2020-12-11 ENCOUNTER — Telehealth: Payer: Self-pay | Admitting: Physician Assistant

## 2020-12-11 ENCOUNTER — Ambulatory Visit: Payer: Medicare PPO | Admitting: Cardiology

## 2020-12-11 ENCOUNTER — Telehealth: Payer: Self-pay

## 2020-12-11 LAB — ECHOCARDIOGRAM COMPLETE
Area-P 1/2: 3.48 cm2
Height: 68.5 in
S' Lateral: 2.5 cm
Weight: 2790.4 oz

## 2020-12-11 NOTE — Telephone Encounter (Signed)
Pt is wanting to try vistaril(hydroxyzine) would you like to rx this? FYI [facility] rx 1/28 clonazepam.

## 2020-12-11 NOTE — Telephone Encounter (Signed)
I don't see anxiety medication on chart, but looks like Clonazepam was written from [facility] on 12/06/2020 (pulling from [place]). Do you remember a conversation with patient about this?

## 2020-12-11 NOTE — Telephone Encounter (Signed)
Pt is requesting medication Vistaril be prescribed instead of the other anxiety medication. She said she had spoken to you about this medication before and that she did not need a visit. She is leaving out of town tomorrow morning and had stated she wants this rx to be sent to the [facility] she uses here.

## 2020-12-11 NOTE — Telephone Encounter (Signed)
-----   Message from Richardo Priest, MD sent at 12/11/2020 12:48 PM EST ----- The echocardiogram is normal  There are no findings of heart failure in the laboratory test for both heart failure and fragments of clot from DVT and pulmonary embolism are normal  I think that she should follow-up with her primary care physician and she can get a CTA of her chest done closer to home and be directed for further testing looking for noncardiology causes of her air hunger.

## 2020-12-11 NOTE — Telephone Encounter (Signed)
Spoke with patient regarding results and recommendation.  Patient verbalizes understanding and is agreeable to plan of care. Advised patient to call back with any issues or concerns.  

## 2020-12-12 ENCOUNTER — Telehealth: Payer: Self-pay

## 2020-12-12 ENCOUNTER — Telehealth: Payer: Self-pay | Admitting: Cardiology

## 2020-12-12 NOTE — Telephone Encounter (Signed)
   Primary Cardiologist: Shirlee More, MD  Chart reviewed as part of pre-operative protocol coverage. Patient was seen by Dr. Bettina Gavia 12/11/20 for preoperative evaluation. Recommended to undergo a CTA chest to r/o PE given SOB. Dr. Bettina Gavia to address preop status pending results of imaging.   I will remove from the preop pool at this time.    Pre-op covering staff: - Please contact requesting surgeon's office via preferred method (i.e, phone, fax) to inform them of need for additional testing prior to surgery. Dr. Bettina Gavia to finalize risk assessment.   Abigail Butts, PA-C 12/12/2020, 1:53 PM

## 2020-12-12 NOTE — Telephone Encounter (Signed)
Yes vistaril/hydoxyzine should be fine, you can send and she can avoid klonopine

## 2020-12-12 NOTE — Telephone Encounter (Signed)
Called patient Paige Lozano at the request of Hardie Shackleton from [facility] to inform her that her cardiac clearance is pending results of recent CTA of the chest. Patient stated that she is frustrated because when she went to have the CT done the person could not get an IV and that she was not going to keep being poked with a needle. She said that she had an Echo done as well and that it came back normal. She also stated that she was in the ED at [facility] and that the doctors there along with her PCP has cleared her and that everyone keeps telling her that "No PE" has been found or seen on any of her recent testing. She is to visit with her PCP tomorrow and address the matter and hopefully get the clearance needed for the knee surgery. [name] [place] and would like to know if Dr. Bettina Gavia can fax or place an order for the CT to be done where she is currently located. I told patient that I would have to rach out to Dr. Bettina Gavia and his nurse about what to do about the CT of chest.  Reached out to Resa Miner, RN nurse for Dr. Bettina Gavia and she informed me that Dr. Bettina Gavia addressed the CT of the chest in her result note and patient was also informed of the recommendations.    Richardo Priest, MD  12/11/2020 12:48 PM EST      The echocardiogram is normal  There are no findings of heart failure in the laboratory test for both heart failure and fragments of clot from DVT and pulmonary embolism are normal  I think that she should follow-up with her primary care physician and she can get a CTA of her chest done closer to home and be directed for further testing looking for noncardiology causes of her air hunger.

## 2020-12-12 NOTE — Telephone Encounter (Signed)
Yes I will put an addendum

## 2020-12-12 NOTE — Telephone Encounter (Signed)
Called the requesting office of [facility] and spoke with [facility] the surgery scheduler. I informed her the patient's cardiac clearance is pending the results of CTA of chest to r/o PE. Once images are received and viewed by Dr. Bettina Gavia cardiac clearance will be addressed. She asked if I could call and explain it to the patient due to patient giving their office a call this morning stating she has been cleared. I agreed to call patient to make her aware cardiac clearance is pending results of CTA of chest. Cheetah thanked me for calling.

## 2020-12-12 NOTE — Telephone Encounter (Signed)
°  ° °  [facility] Pre-operative Risk Assessment    Request for surgical clearance:  1. What type of surgery is being performed? Total Knee Arthroplasty of the Right Knee   2. When is this surgery scheduled? TBD   3. What type of clearance is required (medical clearance vs. Pharmacy clearance to hold med vs. Both)? Both, if patient is on a blood thinner.   4. Are there any medications that need to be held prior to surgery and how long? Per [place], with [facility], if the patient is on a blood thinner, she will need to hold it 1 week prior to the procedure.  5. Practice name and name of physician performing surgery? [facility]   Dr. Inda Castle   6. What is your office phone number? [phone]   7.   What is your office fax number? [phone]  8.   Anesthesia type (None, local, MAC, general) ? Spinal    Paige Lozano 12/12/2020, 10:59 AM  _________________________________________________________________

## 2020-12-13 ENCOUNTER — Encounter: Payer: Self-pay | Admitting: Physician Assistant

## 2020-12-13 ENCOUNTER — Telehealth (INDEPENDENT_AMBULATORY_CARE_PROVIDER_SITE_OTHER): Payer: Medicare PPO | Admitting: Physician Assistant

## 2020-12-13 VITALS — Ht 68.5 in | Wt 174.0 lb

## 2020-12-13 DIAGNOSIS — R0989 Other specified symptoms and signs involving the circulatory and respiratory systems: Secondary | ICD-10-CM

## 2020-12-13 DIAGNOSIS — I4510 Unspecified right bundle-branch block: Secondary | ICD-10-CM

## 2020-12-13 DIAGNOSIS — Z86711 Personal history of pulmonary embolism: Secondary | ICD-10-CM | POA: Diagnosis not present

## 2020-12-13 DIAGNOSIS — M5412 Radiculopathy, cervical region: Secondary | ICD-10-CM

## 2020-12-13 DIAGNOSIS — Z01818 Encounter for other preprocedural examination: Secondary | ICD-10-CM

## 2020-12-13 MED ORDER — HYDROXYZINE HCL 10 MG PO TABS: 10.0000 mg | ORAL_TABLET | Freq: Three times a day (TID) | ORAL | 0 refills | Status: DC | PRN

## 2020-12-13 NOTE — Telephone Encounter (Signed)
Sent vistaril to pharmacy. Ok to take to avoid using clonazepam. Ok to take up to three times a day.

## 2020-12-13 NOTE — Telephone Encounter (Signed)
[facility] is calling requesting the prior authorization submitted for Paige Lozano to have the CT location be changed to where she is currently located. Please advise.

## 2020-12-13 NOTE — Telephone Encounter (Signed)
Patient has appt today. Will discuss with provider.

## 2020-12-13 NOTE — Progress Notes (Signed)
Patient ID: Paige Lozano, female   DOB: [birth year], 55 y.o.   MRN: [id number] .Marland KitchenVirtual Visit via Telephone Note  I connected with Paige Lozano on 12/13/20 at 10:10 AM EST by telephone and verified that I am speaking with the correct person using two identifiers.  Location: Patient: home Provider: clinic  .Marland KitchenParticipating in visit:  Patient: Paige Lozano Provider: Iran Planas PA-C   I discussed the limitations, risks, security and privacy concerns of performing an evaluation and management service by telephone and the availability of in person appointments. I also discussed with the patient that there may be a patient responsible charge related to this service. The patient expressed understanding and agreed to proceed.   History of Present Illness: Patient is a 55 year old female who calls into the clinic to discuss need for surgical clearance. She initially had surgical clearance here in our office but she had an abnormal EKG which showed a right bundle branch block and with her history of PE I made her get a surgical cardiac clearance. She did see Dr. Bettina Gavia at the end of January. She was cleared from a cardiology standpoint as long as her CTA was normal. She was unable to get this done while she was in town because of multiple sticks that were unsuccessful. Without contrast she was unable to get image. She is leaving this image so that she can get cleared and have her right total knee replacement. She continues to have some air hunger and shortness of breath but it is unchanged. She did have an incident while she was in town where she had arm numbness and tingling. She went to the ED with a full woke work-up; MRI of head was normal, CT of head was normal, MRI of cervical spine did show some see 3 4 through C6 cervical degeneration changes and mild disc bulge. This was likely what was causing her numbness and tingling of her extremities.  .. Active Ambulatory Problems    Diagnosis Date Noted  .  Multiple sclerosis ([place]) 05/29/2016  . Fibromyalgia 05/29/2016  . 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 Insomnia 05/29/2016  . Ulcerative pancolitis without complication ([place]) 05/08/1600  . Bipolar 2 disorder ([place]) 05/29/2016  . Current smoker 05/29/2016  . Primary osteoarthritis of right knee with recent ACL tear and femoral condylar impaction fracture 06/01/2016  . Anxiety state 06/01/2016  . Left shoulder pain 06/01/2016  . GAD (generalized anxiety disorder) 07/20/2016  . History of pulmonary embolus (PE) 07/21/2016  . Right elbow pain 07/21/2016  . Hot flashes 12/29/2016  . Body aches 12/29/2016  . Pruritus 12/29/2016  . Palpitations 03/08/2017  . Chest tightness 03/08/2017  . Tachycardia 03/08/2017  . Sinus tachycardia 03/12/2017  . PAC (premature atrial contraction) 03/12/2017  . Lumbar degenerative disc disease 06/02/2017  . Plantar fasciitis, bilateral 10/29/2017  . Onychomycosis 02/17/2018  . 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 Subcutaneous mass 04/07/2018  . Family history of breast cancer 12/02/2018  . Acute stress reaction 03/13/2019  . Fracture of second and third metatarsals of the right foot 07/14/2019  . Left wrist injury 07/31/2019  . Thyroid disorder screen 09/04/2019  . Actinic keratoses 09/04/2019  . Abrasion 11/22/2019  . History of lipoma 11/22/2019  . Air hunger 11/28/2020  . SOB (shortness of breath) 11/28/2020  . Right bundle branch block 11/28/2020  . Ulcerative colitis ([place])   . Sleep apnea   . PONV (postoperative nausea and vomiting)   . Pulmonary embolism ([place]) 2000  . COPD (chronic obstructive pulmonary disease) ([place])   .  Complication of anesthesia   . Bipolar 2 disorder, major depressive episode ([place])   . Anxiety   . Cervical radiculopathy 12/13/2020   Resolved Ambulatory Problems    Diagnosis Date Noted  . Right lateral epicondylitis 07/21/2016  . Sinusitis 11/30/2016  . Right wrist injury 12/29/2016  . Hand injury, left, subsequent  encounter 12/30/2016  . Chills (without fever) 03/08/2017   No Additional Past Medical History   Reviewed med, allergy, problem list.       Observations/Objective: No acute distress  Normal mood  Ongoing short of breath but no labored breathing on telephone call.   .. Today's Vitals   12/13/20 0940  Weight: 174 lb (78.9 kg)  Height: 5' 8.5" (1.74 m)   Body mass index is 26.07 kg/m.    Assessment and Plan: Marland KitchenMarland KitchenSharisse was seen today for follow-up.  Diagnoses and all orders for this visit:  History of pulmonary embolism -     CT Angio Chest W/Cm &/Or Wo Cm  Right bundle branch block -     CT Angio Chest W/Cm &/Or Wo Cm  Air hunger -     CT Angio Chest W/Cm &/Or Wo Cm  Pre-op evaluation  Cervical radiculopathy   I reviewed Dr. Joya Gaskins note and he did agree to release her from a cardiac standpoint if her CTA was normal. Since she was unable to get this here in town we are ordering for [facility]. She needs to get this and I will then give her preop clearance. All labs, echo, EKG have been reassuring.  If continue to have problems with radiculopathy follow up with orthopedic.    Follow Up Instructions:    I discussed the assessment and treatment plan with the patient. The patient was provided an opportunity to ask questions and all were answered. The patient agreed with the plan and demonstrated an understanding of the instructions.   The patient was advised to call back or seek an in-person evaluation if the symptoms worsen or if the condition fails to improve as anticipated.  I provided 20 minutes of non-face-to-face time during this encounter.   Iran Planas, PA-C

## 2020-12-13 NOTE — Progress Notes (Signed)
Went to ED on 12/09/2020 Paige Lozano is a 55 y.o. female with PMHx MS on Tysabri.  They have presented with a chief complaint of paresthesias for which neurology was asked to evaluate.   She was last known well on 12/09/20 around midnight. Overnight and into this morning she experienced the fingers on her right hand "turning white" and since then has had shooting pains into her head, neck and right arm. She additionally feels anxious and disoriented. She denies visual changes, visual loss, weakness.    CT head negative.  MRI brain negative. MR Cervical spine IMPRESSION:  1. No cervical cord demyelinating lesions identified.  2. Cervical degenerative disc disease most significant at C5-6 with disc osteophyte causing mild canal stenosis and moderate bilateral neural foraminal narrowing unchanged from March 02, 2019.  3. Disc bulging at L5 C4-5 causes increased mild canal stenosis.  EKG [facility] MUSE - 12/09/2020 3:43 PM EST  Diagnosis Class Abnormal Acquisition Device N0I Systolic BP 370 Diastolic BP 77 Ventricular Rate 86 Atrial Rate 86 P-R Interval 166 QRS Duration 134 Q-T Interval 368 QTC Calculation(Bazett) 440 Calculated P Axis 87 Calculated R Axis 65 Calculated T Axis 56  Diagnosis Normal sinus rhythm Right bundle branch block Abnormal ECG No previous ECGs available Pamala Duffel [phone]) on 07/25/9449 3:88:82 PM certifies that he/she has reviewed the ECG tracing and confirms the independent interpretation is correct.

## 2020-12-19 NOTE — Telephone Encounter (Signed)
Katharine Look with [facility] called in and need have the authorization    on file needs to be changed [facility]   Tax ID  [id number] NPI  [id number]

## 2021-01-13 ENCOUNTER — Other Ambulatory Visit (HOSPITAL_COMMUNITY): Payer: Self-pay | Admitting: Psychiatry

## 2021-01-15 ENCOUNTER — Telehealth: Payer: Self-pay | Admitting: Neurology

## 2021-01-15 NOTE — Telephone Encounter (Signed)
Orthopedic office called, they received medical clearance from [place], they have not heard from cardiology. They wanted to know if we were getting the information and giving cardiac clearance or if patient is seeing a cardiologist. I see where she was referred and CT ordered, but no results or notes. Do you know anything about this?  Orthopedic office ph- [phone] fax - [phone]

## 2021-01-17 ENCOUNTER — Other Ambulatory Visit (HOSPITAL_COMMUNITY): Payer: Self-pay | Admitting: Psychiatry

## 2021-01-17 DIAGNOSIS — F5101 Primary insomnia: Secondary | ICD-10-CM

## 2021-01-22 ENCOUNTER — Telehealth: Payer: Self-pay

## 2021-01-22 MED ORDER — FLUCONAZOLE 150 MG PO TABS: 150.0000 mg | ORAL_TABLET | Freq: Once | ORAL | 0 refills | Status: AC

## 2021-01-22 NOTE — Telephone Encounter (Signed)
Patient advised.

## 2021-01-22 NOTE — Telephone Encounter (Signed)
Sent to pharmacy 

## 2021-01-22 NOTE — Telephone Encounter (Signed)
Paige Lozano reports vaginal itching for 3 days. Denies chills, sweats, fever or pelvic pain. She is requesting yeast infection medication. Pended pharmacy.

## 2021-02-11 ENCOUNTER — Telehealth (INDEPENDENT_AMBULATORY_CARE_PROVIDER_SITE_OTHER): Payer: Medicare PPO | Admitting: Physician Assistant

## 2021-02-11 VITALS — Ht 68.5 in | Wt 168.0 lb

## 2021-02-11 DIAGNOSIS — L723 Sebaceous cyst: Secondary | ICD-10-CM | POA: Diagnosis not present

## 2021-02-11 DIAGNOSIS — F3181 Bipolar II disorder: Secondary | ICD-10-CM | POA: Diagnosis not present

## 2021-02-11 DIAGNOSIS — F5101 Primary insomnia: Secondary | ICD-10-CM

## 2021-02-11 DIAGNOSIS — F411 Generalized anxiety disorder: Secondary | ICD-10-CM

## 2021-02-11 MED ORDER — CLONAZEPAM 0.5 MG PO TABS: 0.5000 mg | ORAL_TABLET | Freq: Two times a day (BID) | ORAL | 2 refills | Status: DC

## 2021-02-11 NOTE — Progress Notes (Signed)
Patient ID: Paige Lozano, female   DOB: [birth year], 55 y.o.   MRN: [id number]  .Marland KitchenVirtual Visit via Video Note  I connected with Paige Lozano on 02/11/2021 at  1:00 PM EDT by a video enabled telemedicine application and verified that I am speaking with the correct person using two identifiers.  Location: Patient: home Provider: clinic  .Marland KitchenParticipating in visit:  Patient: Paige Lozano Provider: Iran Planas PA-C   I discussed the limitations of evaluation and management by telemedicine and the availability of in person appointments. The patient expressed understanding and agreed to proceed.  History of Present Illness: Patient is a 55 year old female with bipolar 2 with focus on major depressive disorder, anxiety, insomnia, COPD, ulcerative colitis who presents to the clinic for medication refill and concerns.  Patient is currently having a lot of problems sleeping.  She cannot stop her mind at night.  She is taking Seroquel 150 mg at bedtime.  She is having to use her Klonopin on top of that.  She does see Dr. De Nurse downstairs.  She feels like she needs medication adjustment.  He does not prescribe her Klonopin.  She is taking on average twice a day.  She denies any suicidal thoughts or homicidal idealizations.  She also complains of a slightly tender nodule in her right breast.  She has had this for quite some time at least a year.  Per patient she was told by myself that it was benign and not to worry about unless starts causing problems.  It does seem to be getting redder.  She denies any fever, chills, body aches.  She is not having any other breast or nipple issues. Last mammogram 2020.    .. Active Ambulatory Problems    Diagnosis Date Noted  . Multiple sclerosis ([place]) 05/29/2016  . Fibromyalgia 05/29/2016  . 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 Insomnia 05/29/2016  . Ulcerative pancolitis without complication ([place]) 78/93/8101  . Bipolar 2 disorder ([place]) 05/29/2016  . Current smoker  05/29/2016  . Primary osteoarthritis of right knee with recent ACL tear and femoral condylar impaction fracture 06/01/2016  . Anxiety state 06/01/2016  . Left shoulder pain 06/01/2016  . GAD (generalized anxiety disorder) 07/20/2016  . History of pulmonary embolus (PE) 07/21/2016  . Right elbow pain 07/21/2016  . Hot flashes 12/29/2016  . Body aches 12/29/2016  . Pruritus 12/29/2016  . Palpitations 03/08/2017  . Chest tightness 03/08/2017  . Tachycardia 03/08/2017  . Sinus tachycardia 03/12/2017  . PAC (premature atrial contraction) 03/12/2017  . Lumbar degenerative disc disease 06/02/2017  . Plantar fasciitis, bilateral 10/29/2017  . Onychomycosis 02/17/2018  . 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 Subcutaneous mass 04/07/2018  . Family history of breast cancer 12/02/2018  . Acute stress reaction 03/13/2019  . Fracture of second and third metatarsals of the right foot 07/14/2019  . Left wrist injury 07/31/2019  . Thyroid disorder screen 09/04/2019  . Actinic keratoses 09/04/2019  . Abrasion 11/22/2019  . History of lipoma 11/22/2019  . Air hunger 11/28/2020  . SOB (shortness of breath) 11/28/2020  . Right bundle branch block 11/28/2020  . Ulcerative colitis ([place])   . Sleep apnea   . PONV (postoperative nausea and vomiting)   . Pulmonary embolism ([place]) 2000  . COPD (chronic obstructive pulmonary disease) ([employer])   . Complication of anesthesia   . Bipolar 2 disorder, major depressive episode ([place])   . Anxiety   . Cervical radiculopathy 12/13/2020  . Inflamed sebaceous cyst 02/11/2021   Resolved Ambulatory Problems  Diagnosis Date Noted  . Right lateral epicondylitis 07/21/2016  . Sinusitis 11/30/2016  . Right wrist injury 12/29/2016  . Hand injury, left, subsequent encounter 12/30/2016  . Chills (without fever) 03/08/2017   No Additional Past Medical History   Reviewed med, allergy, problem list.  Observations/Objective: No acute distress Normal mood Normal  breathing  Right breast appearance of sebaceous cyst( flesh color mobile nodule)  Inflamed(with surrounding erythema) medial breast about 2 oclock in the upper inner quadrant approximately the size of a pea.   .. Today's Vitals   02/11/21 1304  Weight: 168 lb (76.2 kg)  Height: 5' 8.5" (1.74 m)   Body mass index is 25.17 kg/m.   Assessment and Plan: Marland KitchenMarland KitchenDiagnoses and all orders for this visit:  Bipolar 2 disorder, major depressive episode ([place])  Primary insomnia  Inflamed sebaceous cyst -     Ambulatory referral to Dermatology  Anxiety state -     clonazePAM (KLONOPIN) 0.5 MG tablet; Take 1 tablet (0.5 mg total) by mouth in the morning and at bedtime.   On seroquel and lamictal with klonapin for mood. She sees Dr. De Nurse. She is still not sleeping well. I am wondering if this is still some mania not controlled. I think patient would be a great candidate for Vraylar. I am going to send message to start this discussed.  In meantime to see if we can get her sleeping increase seroquel to 200mg  at bedtime.   Pt aware that klonapin is NOT the most appropriate medication for this.discussed abuse and dependence potential.  I will refill but continue to use sparing and use all other treatment options with exercise, counseling etc.   Appears like seb cyst. Will make referral to dermatology for removal since bothering her so much.    Follow Up Instructions:    I discussed the assessment and treatment plan with the patient. The patient was provided an opportunity to ask questions and all were answered. The patient agreed with the plan and demonstrated an understanding of the instructions.   The patient was advised to call back or seek an in-person evaluation if the symptoms worsen or if the condition fails to improve as anticipated.  Spent 30 minutes on video and with patient discussing symptoms, medications, treatment plan.    Iran Planas, PA-C

## 2021-02-11 NOTE — Progress Notes (Signed)
Not sleeping, Seroquel isn't working

## 2021-02-12 ENCOUNTER — Telehealth: Payer: Self-pay | Admitting: Physician Assistant

## 2021-02-12 ENCOUNTER — Encounter: Payer: Self-pay | Admitting: Physician Assistant

## 2021-02-12 NOTE — Telephone Encounter (Signed)
See My chart message

## 2021-02-12 NOTE — Telephone Encounter (Signed)
I have not seen anything. I did sent patient and mychart message to see what is going on. I see nothing in EMR.

## 2021-02-17 ENCOUNTER — Other Ambulatory Visit (HOSPITAL_COMMUNITY): Payer: Self-pay | Admitting: Psychiatry

## 2021-02-19 ENCOUNTER — Encounter: Payer: Self-pay | Admitting: Physician Assistant

## 2021-02-19 ENCOUNTER — Other Ambulatory Visit (HOSPITAL_COMMUNITY): Payer: Self-pay

## 2021-02-19 DIAGNOSIS — F5101 Primary insomnia: Secondary | ICD-10-CM

## 2021-02-19 MED ORDER — QUETIAPINE FUMARATE 200 MG PO TABS: ORAL_TABLET | ORAL | 0 refills | Status: DC

## 2021-02-20 ENCOUNTER — Encounter (HOSPITAL_COMMUNITY): Payer: Self-pay | Admitting: Psychiatry

## 2021-02-20 ENCOUNTER — Telehealth (INDEPENDENT_AMBULATORY_CARE_PROVIDER_SITE_OTHER): Payer: Medicare PPO | Admitting: Psychiatry

## 2021-02-20 DIAGNOSIS — F5101 Primary insomnia: Secondary | ICD-10-CM

## 2021-02-20 DIAGNOSIS — F3181 Bipolar II disorder: Secondary | ICD-10-CM | POA: Diagnosis not present

## 2021-02-20 DIAGNOSIS — F411 Generalized anxiety disorder: Secondary | ICD-10-CM

## 2021-02-20 MED ORDER — QUETIAPINE FUMARATE 400 MG PO TABS: ORAL_TABLET | ORAL | 0 refills | Status: DC

## 2021-02-20 NOTE — Progress Notes (Signed)
[facility] Outpatient Follow up visit   Patient Identification: Paige Lozano MRN:  [id number] Date of Evaluation:  02/20/2021 Referral Source: [facility] Primary care Chief Complaint:    bipolar follow up , depression Visit Diagnosis:    ICD-10-CM   1. Bipolar 2 disorder (HCC)  F31.81   2. Primary insomnia  F51.01 QUEtiapine (SEROQUEL) 400 MG tablet  3. GAD (generalized anxiety disorder)  F41.1     Virtual Visit via Telephone Note  I connected with Paige Lozano on 02/20/21 at  4:30 PM EDT by telephone and verified that I am speaking with the correct person using two identifiers.  Location: Patient: outside Provider: work   I discussed the limitations, risks, security and privacy concerns of performing an evaluation and management service by telephone and the availability of in person appointments. I also discussed with the patient that there may be a patient responsible charge related to this service. The patient expressed understanding and agreed to proceed.      I discussed the assessment and treatment plan with the patient. The patient was provided an opportunity to ask questions and all were answered. The patient agreed with the plan and demonstrated an understanding of the instructions.   The patient was advised to call back or seek an in-person evaluation if the symptoms worsen or if the condition fails to improve as anticipated.  I provided 15  minutes of non-face-to-face time during this encounter    lHistory of Present Illness:   Last seen October 2021 was supposed to follow up 5 week  She has made appointment for today  Having difficulty falling asleep, was out or ran low on seroqeul Jade her primary care have discussed about meds and was suggested to increase seroquel to 300mg   She is also on lamictal  Mom is sick and patient is flying to [place] next week, feels stressed and that effects her sleep as well  Does not endorse alcohol use today , had DUI prior to last  visit   Under treatment for MS   Aggravating factor: surgeries . Multiple medical .surgeries, breakup in past, MS Modifying factor: dogs   Duration more then 10 years     Previous Psychotropic Medications: Yes   Substance Abuse History in the last 12 months:  Yes.   as per history Marijuana says it is medical for her condition.  Consequences of Substance Abuse: Medical Consequences:  fatigue, poor concenctration  Past Medical History:  Past Medical History:  Diagnosis Date  . Anxiety   . Bipolar 2 disorder, major depressive episode ([place])   . Complication of anesthesia   . COPD (chronic obstructive pulmonary disease) ([place])    smoker  . Fibromyalgia   . Insomnia   . Multiple sclerosis ([place])   . PONV (postoperative nausea and vomiting)   . Pulmonary embolism ([place]) 2000  . Sleep apnea    mild OSA no CPAP  . Ulcerative colitis [facility])     Past Surgical History:  Procedure Laterality Date  . FOOT SURGERY Left    bone spur  . HEMORRHOID SURGERY    . HUMERUS FRACTURE SURGERY Left   . MYOMECTOMY    . NASAL SINUS SURGERY    . SHOULDER ARTHROSCOPY WITH SUBACROMIAL DECOMPRESSION AND BICEP TENDON REPAIR Left 07/27/2016   Procedure: SHOULDER ARTHROSCOPY DEBRIDEMENT ROTATOR CUFF AND LABRUM, SUBACROMIAL DECOMPRESSION, BICEPS TENODESIS;  Surgeon: Tania Ade, MD;  Location: [place];  Service: Orthopedics;  Laterality: Left;  SHOULDER ARTHROSCOPY DEBRIDEMENT ROTATOR CUFF AND LABRUM,  SUBACROMIAL DECOMPRESSION, BICEPS TENOTOMY, POSSIBLE TENODESIS  . THUMB ARTHROSCOPY     5 surgeries on left thumb, 4 surgeries on right  . TONSILLECTOMY AND ADENOIDECTOMY       Family History:  Family History  Adopted: Yes  Problem Relation Age of Onset  . Breast cancer Sister        Triple Negative    Social History:   Social History   Socioeconomic History  . Marital status: Divorced    Spouse name: Not on file  . Number of children: Not on file  . Years of  education: Not on file  . Highest education level: Not on file  Occupational History  . Not on file  Tobacco Use  . Smoking status: Former Smoker    Packs/day: 0.25    Types: Cigarettes    Quit date: 07/08/2016    Years since quitting: 4.6  . Smokeless tobacco: Never Used  Vaping Use  . Vaping Use: Never used  Substance and Sexual Activity  . Alcohol use: Yes    Alcohol/week: 0.0 standard drinks    Comment: rare   . Drug use: No    Types: Marijuana    Comment: last use 5 month ago  . Sexual activity: Yes    Partners: Male    Birth control/protection: None  Other Topics Concern  . Not on file  Social History Narrative  . Not on file   Social Determinants of Health   Financial Resource Strain: Not on file  Food Insecurity: Not on file  Transportation Needs: Not on file  Physical Activity: Not on file  Stress: Not on file  Social Connections: Not on file     Allergies:   Allergies  Allergen Reactions  . Ketamine Anaphylaxis  . Oxycodone Diarrhea and Other (See Comments)  . Papaya Enzyme Anaphylaxis  . Digestive Enzymes Swelling    Other reaction(s): Throat Swelling    Metabolic Disorder Labs: No results found for: HGBA1C, MPG No results found for: PROLACTIN No results found for: CHOL, TRIG, HDL, CHOLHDL, VLDL, LDLCALC   Current Medications: Current Outpatient Medications  Medication Sig Dispense Refill  . albuterol (VENTOLIN HFA) 108 (90 Base) MCG/ACT inhaler INHALE 2 PUFFS EVERY 6 HOURS AS NEEDED FOR WHEEZING OR SHORTNESS OF BREATH 6.7 each 0  . Biotin 10 MG CAPS Take by mouth daily.    . calcium carbonate (OSCAL) 1500 (600 Ca) MG TABS tablet Take 600 mg by mouth in the morning and at bedtime.    . Cholecalciferol 25 MCG (1000 UT) tablet Take by mouth.    . clonazePAM (KLONOPIN) 0.5 MG tablet Take 1 tablet (0.5 mg total) by mouth in the morning and at bedtime. 60 tablet 2  . ferrous sulfate 325 (65 FE) MG tablet Take 1 tablet by mouth daily.    .  fluticasone (FLONASE) 50 MCG/ACT nasal spray Place 2 sprays into both nostrils daily. 16 g 3  . gabapentin (NEURONTIN) 300 MG capsule Take 300 mg by mouth in the morning and at bedtime.    Marland Kitchen ibuprofen (ADVIL) 800 MG tablet Take 800 mg by mouth as needed.    . lamoTRIgine (LAMICTAL) 200 MG tablet TAKE 1 TABLET BY MOUTH TWICE DAILY 60 tablet 1  . natalizumab (TYSABRI) 300 MG/15ML injection Inject into the vein.    Marland Kitchen QUEtiapine (SEROQUEL) 400 MG tablet Dose increased to 400mg  today. Patient may call for refill as she may run low in 2 weeks 30 tablet 0  . vitamin B-12 (  CYANOCOBALAMIN) 1000 MCG tablet Take 1,000 mcg by mouth daily.    . vitamin E 1000 UNIT capsule Take 1,000 Units by mouth daily.     No current facility-administered medications for this visit.      Psychiatric Specialty Exam: Review of Systems  Psychiatric/Behavioral: Negative for suicidal ideas. The patient has insomnia.     There were no vitals taken for this visit.There is no height or weight on file to calculate BMI.  General Appearance:   Eye Contact:    Speech:  Normal Rate  Volume:  normal  Mood: stress  Affect:  congruent  Thought Process:  Goal Directed  Orientation:  Full (Time, Place, and Person)  Thought Content:  Logical  Suicidal Thoughts:  No  Homicidal Thoughts:  No  Memory:  Immediate;   Fair Recent;   Fair  Judgement:  Fair  Insight:  Fair  Psychomotor Activity:  Normal  Concentration:  Concentration: Fair and Attention Span: Fair  Recall:  [employer] of Knowledge:Fair  Language: Fair  Akathisia:  Negative  Handed:  Right  AIMS (if indicated):    Assets:  Desire for Improvement  ADL's:  Intact  Cognition: WNL  Sleep:  Fair while on meds    Treatment Plan Summary: Medication management and Plan as follows  Bipolar 2: depressed;depressed,  Subdued/stressed, wants to increase seroquel to help sleep as well, will increase to 400mg  denies side effects, continue lamictal  GAD: stressed, on  klonopine bid have suggested therapy Discussed compliance with appointments and regular follow up  Insomnia: fluctuates, gets worse under stress, increase seroquel to 400mg    Fu 6 weeks to 33months. Says will call office to make appointment  Merian Capron, MD 4/14/20224:46 PM

## 2021-02-25 ENCOUNTER — Other Ambulatory Visit: Payer: Self-pay | Admitting: Physician Assistant

## 2021-03-11 ENCOUNTER — Other Ambulatory Visit (HOSPITAL_COMMUNITY): Payer: Self-pay

## 2021-03-11 DIAGNOSIS — F5101 Primary insomnia: Secondary | ICD-10-CM

## 2021-03-11 MED ORDER — QUETIAPINE FUMARATE 400 MG PO TABS: ORAL_TABLET | ORAL | 0 refills | Status: DC

## 2021-03-11 MED ORDER — LAMOTRIGINE 200 MG PO TABS: 200.0000 mg | ORAL_TABLET | Freq: Two times a day (BID) | ORAL | 1 refills | Status: DC

## 2021-04-08 ENCOUNTER — Telehealth (HOSPITAL_COMMUNITY): Payer: Self-pay | Admitting: Psychiatry

## 2021-04-08 DIAGNOSIS — F5101 Primary insomnia: Secondary | ICD-10-CM

## 2021-04-08 MED ORDER — QUETIAPINE FUMARATE 400 MG PO TABS: ORAL_TABLET | ORAL | 0 refills | Status: DC

## 2021-04-08 NOTE — Telephone Encounter (Signed)
Per pt Needs refill on seroquel [facility] [place]

## 2021-04-08 NOTE — Telephone Encounter (Signed)
Ok sent!

## 2021-04-10 ENCOUNTER — Other Ambulatory Visit: Payer: Self-pay | Admitting: *Deleted

## 2021-04-10 DIAGNOSIS — F411 Generalized anxiety disorder: Secondary | ICD-10-CM

## 2021-04-11 NOTE — Telephone Encounter (Signed)
Has 2 refills on it from last month.

## 2021-04-14 ENCOUNTER — Other Ambulatory Visit: Payer: Self-pay | Admitting: Neurology

## 2021-04-14 ENCOUNTER — Other Ambulatory Visit (HOSPITAL_COMMUNITY): Payer: Self-pay

## 2021-04-14 DIAGNOSIS — F411 Generalized anxiety disorder: Secondary | ICD-10-CM

## 2021-04-14 MED ORDER — CLONAZEPAM 0.5 MG PO TABS: 0.5000 mg | ORAL_TABLET | Freq: Two times a day (BID) | ORAL | 0 refills | Status: DC

## 2021-04-14 MED ORDER — LAMOTRIGINE 200 MG PO TABS: 200.0000 mg | ORAL_TABLET | Freq: Two times a day (BID) | ORAL | 0 refills | Status: DC

## 2021-04-14 NOTE — Telephone Encounter (Signed)
Received Clonazepam refill request from [facility] written 02/11/21 #60 with 2 refills to local pharmacy Please advise.

## 2021-04-14 NOTE — Telephone Encounter (Signed)
So she does not need refills until July?

## 2021-04-14 NOTE — Telephone Encounter (Signed)
No she is asking for this to be sent to mail order instead of local.

## 2021-04-29 NOTE — Telephone Encounter (Signed)
Reviewed Dr. Joya Gaskins note from 12/10/2020. He did states she was OK for surgery at that time. Given it has been over 4 months since this time, I attempted to call patient to make sure she has not had any new cardiac symptoms since that time. Left message to call back and ask to speak with pre-op team.  Darreld Mclean, PA-C 04/29/2021 2:26 PM

## 2021-04-29 NOTE — Telephone Encounter (Signed)
Received a fax from [facility] today requesting that this clearance be faxed to them. They gave a new phone and fax number of   Phone [phone] Fax [phone]

## 2021-04-30 ENCOUNTER — Other Ambulatory Visit: Payer: Self-pay | Admitting: Physician Assistant

## 2021-05-09 NOTE — Telephone Encounter (Signed)
Left message to call back at 12:45 PM on 05/09/2021.  Patient needs call back.

## 2021-05-14 NOTE — Telephone Encounter (Signed)
   Patient Name: Paige Lozano  DOB: 03/17/1966  MRN: [id number]   Primary Cardiologist: Shirlee More, MD  Chart reviewed as part of pre-operative protocol coverage. Patient seen in 12/2020 - deemed needing CTA prior to clearance. Per PCP notes, patient was out of town and did not get to r/s. We have tried to reach out to patient several times since then without response back. LMTCB again today. Given inability to reach patient, will remove from pre-op pool until patient returns call. Otherwise will route this message to requesting surgeon team to let them know we were unable to establish contact with patient.  Charlie Pitter, PA-C 05/14/2021, 9:29 AM

## 2021-05-20 ENCOUNTER — Other Ambulatory Visit: Payer: Self-pay | Admitting: Neurology

## 2021-05-20 ENCOUNTER — Other Ambulatory Visit: Payer: Self-pay | Admitting: Physician Assistant

## 2021-05-20 DIAGNOSIS — F411 Generalized anxiety disorder: Secondary | ICD-10-CM

## 2021-05-20 MED ORDER — CLONAZEPAM 0.5 MG PO TABS: 0.5000 mg | ORAL_TABLET | Freq: Two times a day (BID) | ORAL | 0 refills | Status: DC

## 2021-05-20 NOTE — Telephone Encounter (Signed)
Patient called and left vm asking for refills of clonazepam. Last written 04/14/2021 #180 with no refills. I called [facility] and patient never picked up RX. I had them cancel prescription. Can you send to [facility] per patient request? Pended.

## 2021-06-12 ENCOUNTER — Telehealth (HOSPITAL_COMMUNITY): Payer: Self-pay | Admitting: *Deleted

## 2021-06-12 ENCOUNTER — Other Ambulatory Visit (HOSPITAL_COMMUNITY): Payer: Self-pay | Admitting: *Deleted

## 2021-06-12 DIAGNOSIS — F5101 Primary insomnia: Secondary | ICD-10-CM

## 2021-06-12 MED ORDER — QUETIAPINE FUMARATE 400 MG PO TABS: ORAL_TABLET | ORAL | 0 refills | Status: DC

## 2021-06-12 NOTE — Telephone Encounter (Signed)
Pt called requesting Rx Seroquel 400 mg  Last VV--02/20/21 Last refill--04/08/21

## 2021-06-12 NOTE — Telephone Encounter (Signed)
Sent Rx seroquel  400 mg to the pharmacy.

## 2021-06-17 ENCOUNTER — Other Ambulatory Visit (HOSPITAL_COMMUNITY): Payer: Self-pay | Admitting: *Deleted

## 2021-06-18 ENCOUNTER — Encounter (HOSPITAL_COMMUNITY): Payer: Self-pay | Admitting: Psychiatry

## 2021-06-18 ENCOUNTER — Telehealth (INDEPENDENT_AMBULATORY_CARE_PROVIDER_SITE_OTHER): Payer: Medicare PPO | Admitting: Psychiatry

## 2021-06-18 DIAGNOSIS — F3181 Bipolar II disorder: Secondary | ICD-10-CM

## 2021-06-18 DIAGNOSIS — F411 Generalized anxiety disorder: Secondary | ICD-10-CM

## 2021-06-18 DIAGNOSIS — F5102 Adjustment insomnia: Secondary | ICD-10-CM | POA: Diagnosis not present

## 2021-06-18 MED ORDER — LAMOTRIGINE 200 MG PO TABS: 200.0000 mg | ORAL_TABLET | Freq: Two times a day (BID) | ORAL | 0 refills | Status: DC

## 2021-06-18 NOTE — Progress Notes (Signed)
[facility] Outpatient Follow up visit   Patient Identification: Brandilee Hanko MRN:  [id number] Date of Evaluation:  06/18/2021 Referral Source: Luvenia Starch Primary care Chief Complaint:    bipolar follow up , depression Visit Diagnosis:    ICD-10-CM   1. Bipolar 2 disorder (HCC)  F31.81     2. Adjustment insomnia  F51.02     3. GAD (generalized anxiety disorder)  F41.1     Virtual Visit via Video Note  I connected with [name] on 06/18/21 at 11:30 AM EDT by a video enabled telemedicine application and verified that I am speaking with the correct person using two identifiers.  Location: Patient: home Provider: home office   I discussed the limitations of evaluation and management by telemedicine and the availability of in person appointments. The patient expressed understanding and agreed to proceed.     I discussed the assessment and treatment plan with the patient. The patient was provided an opportunity to ask questions and all were answered. The patient agreed with the plan and demonstrated an understanding of the instructions.   The patient was advised to call back or seek an in-person evaluation if the symptoms worsen or if the condition fails to improve as anticipated.  I provided 20 minutes of non-face-to-face time during this encounter.   lHistory of Present Illness:    Patient is with his mom was sick in [place] difficult few weeks now back in [place] still communicates with them and her sister  Mind races at nighttime Seroquel and Lamictal does help she wants to have a 90-day supply on the Lamictal.  She is taking Klonopin 1 instead of 2 times she understands the risk of dependence  She discussed distraction and we talked about distraction may be part of bipolar and to avoid any stimulant medication follow-up with her primary care physician and regarding to multiple sclerosis and is treatment   Something to take Seroquel lower tablet to reduce the sedative side  effect  Has had DUI in the past.  She is stating not taking marijuana regularly Understands the risk of marijuana and other medication combined    Aggravating factor: surgeries . Multiple medical .surgeries, breakup in past, MS Modifying factor: Dogs   Duration more then 10 years     Previous Psychotropic Medications: Yes   Substance Abuse History in the last 12 months:  Yes.   as per history Marijuana says it is medical for her condition.  Consequences of Substance Abuse: Medical Consequences:  fatigue, poor concenctration  Past Medical History:  Past Medical History:  Diagnosis Date   Anxiety    Bipolar 2 disorder, major depressive episode ([place])    Complication of anesthesia    COPD (chronic obstructive pulmonary disease) (HCC)    smoker   Fibromyalgia    Insomnia    Multiple sclerosis (HCC)    PONV (postoperative nausea and vomiting)    Pulmonary embolism (HCC) 2000   Sleep apnea    mild OSA no CPAP   Ulcerative colitis ([place])     Past Surgical History:  Procedure Laterality Date   FOOT SURGERY Left    bone spur   HEMORRHOID SURGERY     HUMERUS FRACTURE SURGERY Left    MYOMECTOMY     NASAL SINUS SURGERY     SHOULDER ARTHROSCOPY WITH SUBACROMIAL DECOMPRESSION AND BICEP TENDON REPAIR Left 07/27/2016   Procedure: SHOULDER ARTHROSCOPY DEBRIDEMENT ROTATOR CUFF AND LABRUM, SUBACROMIAL DECOMPRESSION, BICEPS TENODESIS;  Surgeon: Tania Ade, MD;  Location: [facility];  Service: Orthopedics;  Laterality: Left;  SHOULDER ARTHROSCOPY DEBRIDEMENT ROTATOR CUFF AND LABRUM, SUBACROMIAL DECOMPRESSION, BICEPS TENOTOMY, POSSIBLE TENODESIS   THUMB ARTHROSCOPY     5 surgeries on left thumb, 4 surgeries on right   TONSILLECTOMY AND ADENOIDECTOMY       Family History:  Family History  Adopted: Yes  Problem Relation Age of Onset   Breast cancer Sister        Triple Negative    Social History:   Social History   Socioeconomic History   Marital status:  Divorced    Spouse name: Not on file   Number of children: Not on file   Years of education: Not on file   Highest education level: Not on file  Occupational History   Not on file  Tobacco Use   Smoking status: Former    Packs/day: 0.25    Types: Cigarettes    Quit date: 07/08/2016    Years since quitting: 4.9   Smokeless tobacco: Never  Vaping Use   Vaping Use: Never used  Substance and Sexual Activity   Alcohol use: Yes    Alcohol/week: 0.0 standard drinks    Comment: rare    Drug use: No    Types: Marijuana    Comment: last use 5 month ago   Sexual activity: Yes    Partners: Male    Birth control/protection: None  Other Topics Concern   Not on file  Social History Narrative   Not on file   Social Determinants of Health   Financial Resource Strain: Not on file  Food Insecurity: Not on file  Transportation Needs: Not on file  Physical Activity: Not on file  Stress: Not on file  Social Connections: Not on file     Allergies:   Allergies  Allergen Reactions   Ketamine Anaphylaxis   Oxycodone Diarrhea and Other (See Comments)   Papaya Enzyme Anaphylaxis   Digestive Enzymes Swelling    Other reaction(s): Throat Swelling    Metabolic Disorder Labs: No results found for: HGBA1C, MPG No results found for: PROLACTIN No results found for: CHOL, TRIG, HDL, CHOLHDL, VLDL, LDLCALC   Current Medications: Current Outpatient Medications  Medication Sig Dispense Refill   albuterol (VENTOLIN HFA) 108 (90 Base) MCG/ACT inhaler INHALE 2 PUFFS INTO THE LUNGS EVERY 6 HOURS AS NEEDED FOR WHEEZING OR SHORTNESS OF BREATH 6.7 g 0   Biotin 10 MG CAPS Take by mouth daily.     calcium carbonate (OSCAL) 1500 (600 Ca) MG TABS tablet Take 600 mg by mouth in the morning and at bedtime.     Cholecalciferol 25 MCG (1000 UT) tablet Take by mouth.     clonazePAM (KLONOPIN) 0.5 MG tablet Take 1 tablet (0.5 mg total) by mouth in the morning and at bedtime. 180 tablet 0   ferrous sulfate  325 (65 FE) MG tablet Take 1 tablet by mouth daily.     fluticasone (FLONASE) 50 MCG/ACT nasal spray Place 2 sprays into both nostrils daily. 16 g 3   gabapentin (NEURONTIN) 300 MG capsule Take 300 mg by mouth in the morning and at bedtime.     ibuprofen (ADVIL) 800 MG tablet Take 800 mg by mouth as needed.     lamoTRIgine (LAMICTAL) 200 MG tablet Take 1 tablet (200 mg total) by mouth 2 (two) times daily. 180 tablet 0   natalizumab (TYSABRI) 300 MG/15ML injection Inject into the vein.     QUEtiapine (SEROQUEL) 400 MG tablet Dose increased to '400mg'$   today. Patient may call for refill as she may run low in 2 weeks 30 tablet 0   vitamin B-12 (CYANOCOBALAMIN) 1000 MCG tablet Take 1,000 mcg by mouth daily.     vitamin E 1000 UNIT capsule Take 1,000 Units by mouth daily.     No current facility-administered medications for this visit.      Psychiatric Specialty Exam: Review of Systems  Psychiatric/Behavioral:  Negative for suicidal ideas.    There were no vitals taken for this visit.There is no height or weight on file to calculate BMI.  General Appearance: Casual  Eye Contact: Fair  Speech:  Normal Rate  Volume:  normal  Mood: stress  Affect:  congruent  Thought Process:  Goal Directed  Orientation:  Full (Time, Place, and Person)  Thought Content:  Logical  Suicidal Thoughts:  No  Homicidal Thoughts:  No  Memory:  Immediate;   Fair Recent;   Fair  Judgement:  Fair  Insight:  Fair  Psychomotor Activity:  Normal  Concentration:  Concentration: Fair and Attention Span: Fair  Recall:  [employer] of Knowledge:Fair  Language: Fair  Akathisia:  Negative  Handed:  Right  AIMS (if indicated):    Assets:  Desire for Improvement  ADL's:  Intact  Cognition: WNL  Sleep:  Fair while on meds    Treatment Plan Summary: Medication management and Plan as follows    Prior documentation reviewed Bipolar 2: depressed; fair but has circumstantial depression as well related to her mom and  psychosocial stressors continue Seroquel and Lamictal consider therapy  GAD: Get stressed medication does help keep balance continue low-dose Klonopin and Seroquel  Insomnia: fluctuates, gets worse under stress, reviewed sleep hygiene continue Seroquel avoid marijuana and discussed effects of marijuana to add up and taking other medication weaning Klonopin  Follow-up in 2 to 3 months or earlier if needed  Merian Capron, MD 8/10/202211:52 AM

## 2021-06-25 ENCOUNTER — Other Ambulatory Visit (HOSPITAL_COMMUNITY): Payer: Self-pay

## 2021-06-25 MED ORDER — LAMOTRIGINE 200 MG PO TABS: 200.0000 mg | ORAL_TABLET | Freq: Two times a day (BID) | ORAL | 0 refills | Status: DC

## 2021-07-01 ENCOUNTER — Telehealth (HOSPITAL_COMMUNITY): Payer: Self-pay

## 2021-07-01 ENCOUNTER — Other Ambulatory Visit (HOSPITAL_COMMUNITY): Payer: Self-pay

## 2021-07-01 MED ORDER — LAMOTRIGINE 200 MG PO TABS: 200.0000 mg | ORAL_TABLET | Freq: Two times a day (BID) | ORAL | 0 refills | Status: DC

## 2021-07-01 NOTE — Telephone Encounter (Signed)
Patient called because she moved and [facility] sent medication to wrong home. She has to wait until it is sent to correct home. Sent a one month supply of Lamictal to [facility] until pt gets mail order sent to correct address. Nothing further is needed at this time

## 2021-07-02 LAB — HM COLONOSCOPY

## 2021-07-04 ENCOUNTER — Other Ambulatory Visit: Payer: Self-pay | Admitting: Physician Assistant

## 2021-07-04 ENCOUNTER — Ambulatory Visit (INDEPENDENT_AMBULATORY_CARE_PROVIDER_SITE_OTHER): Payer: Self-pay | Admitting: Physician Assistant

## 2021-07-04 DIAGNOSIS — Z13220 Encounter for screening for lipoid disorders: Secondary | ICD-10-CM

## 2021-07-04 DIAGNOSIS — Z5329 Procedure and treatment not carried out because of patient's decision for other reasons: Secondary | ICD-10-CM

## 2021-07-04 NOTE — Progress Notes (Signed)
No show

## 2021-07-09 ENCOUNTER — Ambulatory Visit (INDEPENDENT_AMBULATORY_CARE_PROVIDER_SITE_OTHER): Payer: Self-pay | Admitting: Physician Assistant

## 2021-07-09 DIAGNOSIS — Z13220 Encounter for screening for lipoid disorders: Secondary | ICD-10-CM

## 2021-07-09 DIAGNOSIS — Z5329 Procedure and treatment not carried out because of patient's decision for other reasons: Secondary | ICD-10-CM

## 2021-07-09 NOTE — Progress Notes (Signed)
Canceled at appt time.

## 2021-07-15 ENCOUNTER — Other Ambulatory Visit (HOSPITAL_COMMUNITY): Payer: Self-pay | Admitting: Psychiatry

## 2021-07-15 DIAGNOSIS — F5101 Primary insomnia: Secondary | ICD-10-CM

## 2021-07-15 NOTE — Telephone Encounter (Signed)
Pt called cannot sleep - wants emergency rx  Med: QUEtiapine (SEROQUEL) 400 MG tablet  Send to: [facility], [place] - [address] AT [place]

## 2021-07-16 NOTE — Telephone Encounter (Signed)
Medication refill sent to pharmacy  

## 2021-07-21 ENCOUNTER — Telehealth (HOSPITAL_COMMUNITY): Payer: Self-pay

## 2021-07-21 NOTE — Telephone Encounter (Signed)
[facility] called requesting a refill on Quetiapine. I told them I would reach out to patient because that medication was sent to [facility] in [place] on 07/16/21. I left vm requesting patient to let us know which pharmacy she will be using because we can't send same medication to two different pharmacies.

## 2021-07-31 ENCOUNTER — Telehealth (HOSPITAL_COMMUNITY): Payer: Self-pay | Admitting: Psychiatry

## 2021-07-31 DIAGNOSIS — F3181 Bipolar II disorder: Secondary | ICD-10-CM

## 2021-07-31 MED ORDER — LAMOTRIGINE 200 MG PO TABS: 200.0000 mg | ORAL_TABLET | Freq: Two times a day (BID) | ORAL | 0 refills | Status: DC

## 2021-07-31 MED ORDER — LAMOTRIGINE 200 MG PO TABS
200.0000 mg | ORAL_TABLET | Freq: Two times a day (BID) | ORAL | 0 refills | Status: DC
Start: 2021-07-31 — End: 2021-08-29

## 2021-07-31 NOTE — Telephone Encounter (Signed)
Pt stated she needs a refill on  lamoTRIgine (LAMICTAL) 200 MG tablet  She said she spoke with Dr. De Nurse about sending all prescriptions to [facility] pharmacy and that she will get a 90 day refill at no charge if sent here.  A partial refill can be sent to  [facility], [place] - [address] AT [place] number to contact pt is [phone]

## 2021-07-31 NOTE — Addendum Note (Signed)
Addended by: Watt Climes on: 07/31/2021 05:06 PM   Modules accepted: Orders

## 2021-07-31 NOTE — Telephone Encounter (Signed)
Medication management - Telephone call with patient to verify how many days of coverage she was needing of Lamictal until her 90 day supply is delivered from her mail delivery pharmacy.  Patient requested a 5 day order be sent to her [facility] located at [address].  Five day order verbally approved by Dr. De Nurse to bridge patient until her full 90 day order is delivered.  Order e-scribed as approved to [facility] requested [facility].

## 2021-08-16 ENCOUNTER — Other Ambulatory Visit (HOSPITAL_COMMUNITY): Payer: Self-pay | Admitting: Psychiatry

## 2021-08-16 DIAGNOSIS — F5101 Primary insomnia: Secondary | ICD-10-CM

## 2021-08-18 ENCOUNTER — Other Ambulatory Visit (HOSPITAL_COMMUNITY): Payer: Self-pay

## 2021-08-18 DIAGNOSIS — F5101 Primary insomnia: Secondary | ICD-10-CM

## 2021-08-18 MED ORDER — QUETIAPINE FUMARATE 400 MG PO TABS
400.0000 mg | ORAL_TABLET | Freq: Every day | ORAL | 0 refills | Status: DC
Start: 2021-08-18 — End: 2021-11-27

## 2021-08-29 ENCOUNTER — Telehealth (INDEPENDENT_AMBULATORY_CARE_PROVIDER_SITE_OTHER): Payer: Medicare PPO | Admitting: Physician Assistant

## 2021-08-29 ENCOUNTER — Encounter: Payer: Self-pay | Admitting: Physician Assistant

## 2021-08-29 ENCOUNTER — Other Ambulatory Visit: Payer: Self-pay

## 2021-08-29 DIAGNOSIS — F439 Reaction to severe stress, unspecified: Secondary | ICD-10-CM

## 2021-08-29 DIAGNOSIS — F411 Generalized anxiety disorder: Secondary | ICD-10-CM

## 2021-08-29 DIAGNOSIS — F17299 Nicotine dependence, other tobacco product, with unspecified nicotine-induced disorders: Secondary | ICD-10-CM

## 2021-08-29 DIAGNOSIS — R0602 Shortness of breath: Secondary | ICD-10-CM | POA: Diagnosis not present

## 2021-08-29 DIAGNOSIS — I7300 Raynaud's syndrome without gangrene: Secondary | ICD-10-CM | POA: Insufficient documentation

## 2021-08-29 MED ORDER — NICOTINE 21 MG/24HR TD PT24: 21.0000 mg | MEDICATED_PATCH | Freq: Every day | TRANSDERMAL | 0 refills | Status: DC

## 2021-08-29 MED ORDER — CLONAZEPAM 0.5 MG PO TABS: 0.5000 mg | ORAL_TABLET | Freq: Two times a day (BID) | ORAL | 1 refills | Status: DC

## 2021-08-29 NOTE — Progress Notes (Signed)
..Virtual Visit via Video Note  I connected with Paige Lozano on 09/01/21 at  8:50 AM EDT by a video enabled telemedicine application and verified that I am speaking with the correct person using two identifiers.  Location: Patient: home Provider: clinic  .Marland KitchenParticipating in visit:  Patient: Paige Lozano Provider: Iran Planas PA-C   I discussed the limitations of evaluation and management by telemedicine and the availability of in person appointments. The patient expressed understanding and agreed to proceed.  History of Present Illness: Pt is a 55 yo female with bipolar 2, anxiety, SOB who presents to the clinic via video to discuss medication.   She is heading back to [place] to settle her fathers estate. She needs klonapin refilled.   She started vaping not knowing there was nicotine in vape. She wants to get off of it but needs help.   Starting having white finger tips all of a sudden.   sOB still continues. Not had spirometry.   .. Active Ambulatory Problems    Diagnosis Date Noted   Multiple sclerosis ([place]) 05/29/2016   Fibromyalgia 05/29/2016   Osteoarthritis of thumb 05/29/2016   Insomnia 05/29/2016   Ulcerative pancolitis without complication ([place]) 31/51/7616   Bipolar 2 disorder ([place]) 05/29/2016   Nicotine dependence 05/29/2016   Primary osteoarthritis of right knee with recent ACL tear and femoral condylar impaction fracture 06/01/2016   Anxiety state 06/01/2016   Left shoulder pain 06/01/2016   GAD (generalized anxiety disorder) 07/20/2016   History of pulmonary embolus ([facility]) 07/21/2016   Right elbow pain 07/21/2016   Hot flashes 12/29/2016   Body aches 12/29/2016   Pruritus 12/29/2016   Palpitations 03/08/2017   Chest tightness 03/08/2017   Tachycardia 03/08/2017   Sinus tachycardia 03/12/2017   PAC (premature atrial contraction) 03/12/2017   Lumbar degenerative disc disease 06/02/2017   Plantar fasciitis, bilateral 10/29/2017   Onychomycosis 02/17/2018    Neurodermatitis 03/29/2018   Subcutaneous mass 04/07/2018   Family history of breast cancer 12/02/2018   Acute stress reaction 03/13/2019   Fracture of second and third metatarsals of the right foot 07/14/2019   Left wrist injury 07/31/2019   Thyroid disorder screen 09/04/2019   Actinic keratoses 09/04/2019   Abrasion 11/22/2019   History of lipoma 11/22/2019   Air hunger 11/28/2020   SOB (shortness of breath) 11/28/2020   Right bundle branch block 11/28/2020   Ulcerative colitis ([place])    Sleep apnea    PONV (postoperative nausea and vomiting)    Pulmonary embolism ([place]) 2000   COPD (chronic obstructive pulmonary disease) ([place])    Complication of anesthesia    Bipolar 2 disorder, major depressive episode ([place])    Anxiety    Cervical radiculopathy 12/13/2020   Inflamed sebaceous cyst 02/11/2021   Raynaud's phenomenon without gangrene 08/29/2021   Stress at home 08/29/2021   Resolved Ambulatory Problems    Diagnosis Date Noted   Right lateral epicondylitis 07/21/2016   Sinusitis 11/30/2016   Right wrist injury 12/29/2016   Hand injury, left, subsequent encounter 12/30/2016   Chills (without fever) 03/08/2017   No Additional Past Medical History    Observations/Objective: No acute distress Normal breathing  .Marland KitchenThere were no vitals filed for this visit. There is no height or weight on file to calculate BMI.    Assessment and Plan:  Marland KitchenMarland KitchenDiagnoses and all orders for this visit:  Anxiety state -     clonazePAM (KLONOPIN) 0.5 MG tablet; Take 1 tablet (0.5 mg total) by mouth in the morning and at  bedtime.  Raynaud's phenomenon without gangrene  Stress at home  SOB (shortness of breath)  Other tobacco product nicotine dependence with nicotine-induced disorder -     nicotine (NICODERM CQ) 21 mg/24hr patch; Place 1 patch (21 mg total) onto the skin daily.  Klonapin refilled.  Discussed raynauds and link to autoimmune could also be the recent vaping.  Start nicotine  patches and stop vaping then will titrate down on patches.  SOB could be from vaping and anxiety. Needs spirometry.  Follow Up Instructions:    I discussed the assessment and treatment plan with the patient. The patient was provided an opportunity to ask questions and all were answered. The patient agreed with the plan and demonstrated an understanding of the instructions.   The patient was advised to call back or seek an in-person evaluation if the symptoms worsen or if the condition fails to improve as anticipated.    Iran Planas, PA-C

## 2021-09-01 ENCOUNTER — Telehealth: Payer: Self-pay | Admitting: Physician Assistant

## 2021-09-01 ENCOUNTER — Telehealth: Payer: Self-pay | Admitting: *Deleted

## 2021-09-01 NOTE — Telephone Encounter (Signed)
Clonazepam was sent on 08/29/2021 to [facility], [address] AT [place], [place] [phone]

## 2021-09-01 NOTE — Telephone Encounter (Signed)
Thank you :)

## 2021-09-01 NOTE — Telephone Encounter (Signed)
Was left on hold.  Spoke w/Jade and was told to approve her for the early release of her medication.

## 2021-09-01 NOTE — Telephone Encounter (Signed)
Pt called. She needs  a refill on her Alprazolam. She is  flying out tomorrow and needs med today.She states script was supposed to have been sent out already. Please [facility] [address] In [place].  Thank you.

## 2021-09-01 NOTE — Telephone Encounter (Signed)
Pt informed of RX being sent on 08/29/2021.  Apparently she told the pharmacy alprazolam rather than clonazepam.  She will check with the pharmacy.  Charyl Bigger, CMA

## 2021-09-12 ENCOUNTER — Ambulatory Visit (INDEPENDENT_AMBULATORY_CARE_PROVIDER_SITE_OTHER): Payer: Medicare PPO | Admitting: Physician Assistant

## 2021-09-12 VITALS — Ht 68.5 in | Wt 156.1 lb

## 2021-09-12 DIAGNOSIS — Z0000 Encounter for general adult medical examination without abnormal findings: Secondary | ICD-10-CM

## 2021-09-12 DIAGNOSIS — Z1231 Encounter for screening mammogram for malignant neoplasm of breast: Secondary | ICD-10-CM | POA: Diagnosis not present

## 2021-09-12 NOTE — Patient Instructions (Addendum)
[place] Maintenance Summary and Written Plan of Care  [name] ,  Thank you for allowing me to perform your Medicare Annual Wellness Visit and for your ongoing commitment to your health.   Health Maintenance & Immunization History Health Maintenance  Topic Date Due   PAP SMEAR-Modifier  09/12/2021 (Originally 10/06/1987)   COVID-19 Vaccine (3 - Moderna risk series) 09/28/2021 (Originally 04/13/2020)   Zoster Vaccines- Shingrix (1 of 2) 12/13/2021 (Originally 10/05/1985)   INFLUENZA VACCINE  02/06/2022 (Originally 06/09/2021)   Pneumococcal Vaccine 68-28 Years old (2 - PCV) 09/12/2022 (Originally 12/16/2016)   MAMMOGRAM  09/12/2022 (Originally 12/08/2020)   DEXA SCAN  09/12/2022 (Originally 1965/12/14)   Hepatitis C Screening  09/12/2022 (Originally 10/05/1984)   HIV Screening  09/12/2022 (Originally 10/05/1981)   TETANUS/TDAP  10/11/2030   COLONOSCOPY (Pts 45-36yrs Insurance coverage will need to be confirmed)  07/03/2031   HPV VACCINES  Aged Out   Immunization History  Administered Date(s) Administered   Influenza,inj,Quad PF,6+ Mos 12/06/2018   Influenza,inj,quad, With Preservative 08/20/2009   Influenza-Unspecified 07/10/2016   Moderna Sars-Covid-2 Vaccination 02/17/2020, 03/16/2020   Pneumococcal Polysaccharide-23 12/17/2015   Tdap 05/25/2011, 08/04/2018, 10/11/2020    These are the patient goals that we discussed:  Goals Addressed               This Visit's Progress     Patient Stated (pt-stated)        09/12/2021 AWV Goal: Improved Nutrition/Diet  Patient will verbalize understanding that diet plays an important role in overall health and that a poor diet is a risk factor for many chronic medical conditions.  Over the next year, patient will improve self management of their diet by incorporating improved meal pattern. Patient will utilize available community resources to help with food acquisition if needed (ex: food pantries, [address], etc) Patient will work with nutrition specialist if a referral was made          This is a list of Health Maintenance Items that are overdue or due now: Pneumococcal vaccine  Influenza vaccine Screening mammography Screening Pap smear and pelvic exam  Bone densitometry screening Shingles vaccine Eye exam Hearing exam  Orders/Referrals Placed Today: Orders Placed This Encounter  Procedures   Mammogram 3D SCREEN BREAST BILATERAL    Standing Status:   Future    Standing Expiration Date:   09/12/2022    Scheduling Instructions:     Please call patient to schedule.    Order Specific Question:   Reason for Exam (SYMPTOM  OR DIAGNOSIS REQUIRED)    Answer:   breast cancer screening    Order Specific Question:   Is the patient pregnant?    Answer:   No    Order Specific Question:   Preferred imaging location?    Answer:   [facility]    (Contact our referral department at [phone] if you have not spoken with someone about your referral appointment within the next 5 days)    Follow-up Plan Follow-up with Donella Stade, PA-C as planned Schedule your your shingles vaccine at the pharmacy.  Pneumonia vaccine can be done in the office after you discuss it with Jade.  Flu vaccine can also be done in the office or the pharmacy after you are feeling better. Mammogram referral has been sent.  Please schedule your eye exam and hearing exam.  Pap smear and Bone density can be scheduled after you discuss it with Jade. Medicare wellness visit in one year.  AVS printed and mailed to the patient.      Health Maintenance, Female Adopting a healthy lifestyle and getting preventive care are important in promoting health and wellness. Ask your health care provider about: The right schedule for you to have regular tests and exams. Things you can do on your own to prevent diseases and keep yourself healthy. What should I know about diet, weight, and exercise? Eat a  healthy diet  Eat a diet that includes plenty of vegetables, fruits, low-fat dairy products, and lean protein. Do not eat a lot of foods that are high in solid fats, added sugars, or sodium. Maintain a healthy weight Body mass index (BMI) is used to identify weight problems. It estimates body fat based on height and weight. Your health care provider can help determine your BMI and help you achieve or maintain a healthy weight. Get regular exercise Get regular exercise. This is one of the most important things you can do for your health. Most adults should: Exercise for at least 150 minutes each week. The exercise should increase your heart rate and make you sweat (moderate-intensity exercise). Do strengthening exercises at least twice a week. This is in addition to the moderate-intensity exercise. Spend less time sitting. Even light physical activity can be beneficial. Watch cholesterol and blood lipids Have your blood tested for lipids and cholesterol at 55 years of age, then have this test every 5 years. Have your cholesterol levels checked more often if: Your lipid or cholesterol levels are high. You are older than 55 years of age. You are at high risk for heart disease. What should I know about cancer screening? Depending on your health history and family history, you may need to have cancer screening at various ages. This may include screening for: Breast cancer. Cervical cancer. Colorectal cancer. Skin cancer. Lung cancer. What should I know about heart disease, diabetes, and high blood pressure? Blood pressure and heart disease High blood pressure causes heart disease and increases the risk of stroke. This is more likely to develop in people who have high blood pressure readings or are overweight. Have your blood pressure checked: Every 3-5 years if you are 17-31 years of age. Every year if you are 51 years old or older. Diabetes Have regular diabetes screenings. This checks  your fasting blood sugar level. Have the screening done: Once every three years after age 25 if you are at a normal weight and have a low risk for diabetes. More often and at a younger age if you are overweight or have a high risk for diabetes. What should I know about preventing infection? Hepatitis B If you have a higher risk for hepatitis B, you should be screened for this virus. Talk with your health care provider to find out if you are at risk for hepatitis B infection. Hepatitis C Testing is recommended for: Everyone born from [birth year] through 1965. Anyone with known risk factors for hepatitis C. Sexually transmitted infections (STIs) Get screened for STIs, including gonorrhea and chlamydia, if: You are sexually active and are younger than 55 years of age. You are older than 55 years of age and your health care provider tells you that you are at risk for this type of infection. Your sexual activity has changed since you were last screened, and you are at increased risk for chlamydia or gonorrhea. Ask your health care provider if you are at risk. Ask your health care provider about whether you are at high risk for HIV.  Your health care provider may recommend a prescription medicine to help prevent HIV infection. If you choose to take medicine to prevent HIV, you should first get tested for HIV. You should then be tested every 3 months for as long as you are taking the medicine. Pregnancy If you are about to stop having your period (premenopausal) and you may become pregnant, seek counseling before you get pregnant. Take 400 to 800 micrograms (mcg) of folic acid every day if you become pregnant. Ask for birth control (contraception) if you want to prevent pregnancy. Osteoporosis and menopause Osteoporosis is a disease in which the bones lose minerals and strength with aging. This can result in bone fractures. If you are 33 years old or older, or if you are at risk for osteoporosis and fractures,  ask your health care provider if you should: Be screened for bone loss. Take a calcium or vitamin D supplement to lower your risk of fractures. Be given hormone replacement therapy (HRT) to treat symptoms of menopause. Follow these instructions at home: Alcohol use Do not drink alcohol if: Your health care provider tells you not to drink. You are pregnant, may be pregnant, or are planning to become pregnant. If you drink alcohol: Limit how much you have to: 0-1 drink a day. Know how much alcohol is in your drink. In the [place] one drink equals one 12 oz bottle of beer (355 mL), one 5 oz glass of wine (148 mL), or one 1 oz glass of hard liquor (44 mL). Lifestyle Do not use any products that contain nicotine or tobacco. These products include cigarettes, chewing tobacco, and vaping devices, such as e-cigarettes. If you need help quitting, ask your health care provider. Do not use street drugs. Do not share needles. Ask your health care provider for help if you need support or information about quitting drugs. General instructions Schedule regular health, dental, and eye exams. Stay current with your vaccines. Tell your health care provider if: You often feel depressed. You have ever been abused or do not feel safe at home. Summary Adopting a healthy lifestyle and getting preventive care are important in promoting health and wellness. Follow your health care provider's instructions about healthy diet, exercising, and getting tested or screened for diseases. Follow your health care provider's instructions on monitoring your cholesterol and blood pressure. This information is not intended to replace advice given to you by your health care provider. Make sure you discuss any questions you have with your health care provider. Document Revised: 03/17/2021 Document Reviewed: 03/17/2021 [employer] Patient Education  [employer].

## 2021-09-12 NOTE — Progress Notes (Addendum)
MEDICARE ANNUAL WELLNESS VISIT  09/12/2021  Telephone Visit Disclaimer This Medicare AWV was conducted by telephone due to national recommendations for restrictions regarding the COVID-19 Pandemic (e.g. social distancing).  I verified, using two identifiers, that I am speaking with Paige Lozano or their authorized healthcare agent. I discussed the limitations, risks, security, and privacy concerns of performing an evaluation and management service by telephone and the potential availability of an in-person appointment in the future. The patient expressed understanding and agreed to proceed.  Location of Patient: Home Location of Provider (nurse):  In the office.  Subjective:    Paige Lozano is a 55 y.o. female patient of Alden Hipp, Royetta Car, PA-C who had a Medicare Annual Wellness Visit today via telephone. Paige Lozano is Legally disabled and lives alone. Paige Lozano has 1 child but her daughter passed away in 17-Feb-2020. Paige Lozano reports that Paige Lozano is socially active and does interact with friends/family regularly. Paige Lozano is moderately physically active and enjoys workout and spending time with her friends.  Confirmed with patient about medicare status- Paige Lozano stated that Paige Lozano has had medicare since 02-17-2011.  Patient Care Team: Lavada Mesi as PCP - General (Family Medicine) Richardo Priest, MD as PCP - Cardiology (Cardiology)  Advanced Directives 09/12/2021 08/11/2016 07/27/2016 07/22/2016 05/29/2016  Does Patient Have a Medical Advance Directive? No Yes No No Yes  Type of Advance Directive - Living will - - Living will;Out of facility DNR (pink MOST or yellow form)  Copy of [place] in Chart? - No - copy requested - - -  Would patient like information on creating a medical advance directive? No - Patient declined - No - patient declined information - [facility] Utilization Over the Past 12 Months: # of hospitalizations or ER visits: 6 # of surgeries: 0  Review of Systems    Patient  reports that her overall health is better compared to last year.  History obtained from chart review and the patient  Patient Reported Readings (BP, Pulse, CBG, Weight, etc) Weight: 156.1 lb Height: 87f8.5 in   Pain Assessment Pain : No/denies pain     Current Medications & Allergies (verified) Allergies as of 09/12/2021       Reactions   Ketamine Anaphylaxis   Other Anaphylaxis, Hives, Swelling   curry curry   Oxycodone Diarrhea, Other (See Comments)   Papaya Enzyme Anaphylaxis   Digestive Enzymes Swelling   Other reaction(s): Throat Swelling        Medication List        Accurate as of September 12, 2021  2:19 PM. If you have any questions, ask your nurse or doctor.          albuterol 108 (90 Base) MCG/ACT inhaler Commonly known as: VENTOLIN HFA INHALE 2 PUFFS INTO THE LUNGS EVERY 6 HOURS AS NEEDED FOR WHEEZING OR SHORTNESS OF BREATH   Biotin 10 MG Caps Take by mouth daily.   calcium carbonate 1500 (600 Ca) MG Tabs tablet Commonly known as: OSCAL Take 600 mg by mouth in the morning and at bedtime.   Cholecalciferol 25 MCG (1000 UT) tablet Take by mouth.   clonazePAM 0.5 MG tablet Commonly known as: KLONOPIN Take 1 tablet (0.5 mg total) by mouth in the morning and at bedtime.   ferrous sulfate 325 (65 FE) MG tablet Take 1 tablet by mouth daily.   fluticasone 50 MCG/ACT nasal spray Commonly known as: FLONASE Place 2 sprays into both nostrils daily.  folic acid 1 MG tablet Commonly known as: FOLVITE Take 1 mg by mouth daily.   gabapentin 300 MG capsule Commonly known as: NEURONTIN Take 300 mg by mouth in the morning and at bedtime.   ibuprofen 800 MG tablet Commonly known as: ADVIL Take 800 mg by mouth as needed.   lamoTRIgine 200 MG tablet Commonly known as: LAMICTAL Take 1 tablet (200 mg total) by mouth 2 (two) times daily. Just a bridge rx until mail order comes   nicotine 21 mg/24hr patch Commonly known as: Nicoderm CQ Place 1 patch  (21 mg total) onto the skin daily.   QUEtiapine 400 MG tablet Commonly known as: SEROQUEL Take 1 tablet (400 mg total) by mouth daily.   sulfaSALAzine 500 MG tablet Commonly known as: AZULFIDINE Take by mouth.   Tysabri 300 MG/15ML injection Generic drug: natalizumab Inject into the vein.   vitamin B-12 1000 MCG tablet Commonly known as: CYANOCOBALAMIN Take 1,000 mcg by mouth daily.   vitamin E 1000 UNIT capsule Take 1,000 Units by mouth daily.        History (reviewed): Past Medical History:  Diagnosis Date   Anxiety    Bipolar 2 disorder, major depressive episode ([place])    Complication of anesthesia    COPD (chronic obstructive pulmonary disease) (HCC)    smoker   Fibromyalgia    Insomnia    Multiple sclerosis (HCC)    PONV (postoperative nausea and vomiting)    Pulmonary embolism ([place]) 2000   Sleep apnea    mild OSA no CPAP   Ulcerative colitis ([place])    Past Surgical History:  Procedure Laterality Date   FOOT SURGERY Left    bone spur   HEMORRHOID SURGERY     HUMERUS FRACTURE SURGERY Left    MYOMECTOMY     NASAL SINUS SURGERY     SHOULDER ARTHROSCOPY WITH SUBACROMIAL DECOMPRESSION AND BICEP TENDON REPAIR Left 07/27/2016   Procedure: SHOULDER ARTHROSCOPY DEBRIDEMENT ROTATOR CUFF AND LABRUM, SUBACROMIAL DECOMPRESSION, BICEPS TENODESIS;  Surgeon: Tania Ade, MD;  Location: [place];  Service: Orthopedics;  Laterality: Left;  SHOULDER ARTHROSCOPY DEBRIDEMENT ROTATOR CUFF AND LABRUM, SUBACROMIAL DECOMPRESSION, BICEPS TENOTOMY, POSSIBLE TENODESIS   THUMB ARTHROSCOPY     5 surgeries on left thumb, 4 surgeries on right   TONSILLECTOMY AND ADENOIDECTOMY     Family History  Adopted: Yes  Problem Relation Age of Onset   Breast cancer Sister        Triple Negative   Social History   Socioeconomic History   Marital status: Divorced    Spouse name: Not on file   Number of children: 1   Years of education: 16   Highest education level:  Bachelor's degree (e.g., BA, AB, BS)  Occupational History   Occupation: Legally disabled  Tobacco Use   Smoking status: Former    Packs/day: 0.25    Types: Cigarettes    Quit date: 07/08/2016    Years since quitting: 5.1   Smokeless tobacco: Never  Vaping Use   Vaping Use: Every day   Substances: Nicotine  Substance and Sexual Activity   Alcohol use: Yes    Alcohol/week: 0.0 standard drinks    Comment: rare    Drug use: No    Types: Marijuana    Comment: 09/05/21   Sexual activity: Yes    Partners: Male    Birth control/protection: None  Other Topics Concern   Not on file  Social History Narrative   Lives alone with her  two dogs. Paige Lozano had a daughter who has passed away. Likes to work out and enjoys spending time with friends.   Social Determinants of Health   Financial Resource Strain: Medium Risk   Difficulty of Paying Living Expenses: Somewhat hard  Food Insecurity: Food Insecurity Present   Worried About [place] in the Last Year: Sometimes true   Ran Out of Food in the Last Year: Sometimes true  Transportation Needs: No Transportation Needs   Lack of Transportation (Medical): No   Lack of Transportation (Non-Medical): No  Physical Activity: Sufficiently Active   Days of Exercise per Week: 5 days   Minutes of Exercise per Session: 120 min  Stress: Stress Concern Present   Feeling of Stress : Rather much  Social Connections: Moderately Isolated   Frequency of Communication with Friends and Family: More than three times a week   Frequency of Social Gatherings with Friends and Family: More than three times a week   Attends Religious Services: 1 to 4 times per year   Active Member of [employer] or Organizations: No   Attends Archivist Meetings: Never   Marital Status: Divorced    Activities of Daily Living In your present state of health, do you have any difficulty performing the following activities: 09/12/2021  Hearing? 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 Y  Comment hasnt seen an eye doctor.  Difficulty concentrating or making decisions? Y  Comment difficulty decisions due to MS; remembering has been constant such as not remember why Paige Lozano went ina room and has to use a list when Paige Lozano goes out.  Walking or climbing stairs? Y  Comment Paige Lozano is going to have knee replacement on the right knee in January.  Dressing or bathing? N  Doing errands, shopping? N  Preparing Food and eating ? Y  Comment sometimes preparing food is difficult.  Using the Toilet? N  In the past six months, have you accidently leaked urine? N  Do you have problems with loss of bowel control? N  Managing your Medications? N  Managing your Finances? Y  Comment Paige Lozano feels that Paige Lozano is not the best at managing her finances.  Housekeeping or managing your Housekeeping? N  Some recent data might be hidden    Patient Education/ Literacy How often do you need to have someone help you when you read instructions, pamphlets, or other written materials from your doctor or pharmacy?: 1 - Never What is the last grade level you completed in school?: Bachelor's degree  Exercise Current Exercise Habits: Home exercise routine, Type of exercise: Other - see comments (swimming), Time (Minutes): > 60, Frequency (Times/Week): 5, Weekly Exercise (Minutes/Week): 0, Intensity: Moderate, Exercise limited by: None identified  Diet Patient reports consuming 1 meals a day and 1 snack(s) a day Patient reports that her primary diet is: Regular Patient reports that Paige Lozano does have regular access to food.   Depression Screen PHQ 2/9 Scores 09/12/2021 03/08/2019 08/06/2017  PHQ - 2 Score 5 6 0  PHQ- 9 Score 9 21 -     Fall Risk Fall Risk  09/12/2021  Falls in the past year? 1  Number falls in past yr: 1  Injury with Fall? 1  Risk for fall due to : History of fall(s);Impaired balance/gait  Follow up Falls evaluation completed;Education provided;Falls prevention discussed      Objective:  Paige Lozano seemed alert and oriented and Paige Lozano participated appropriately during our telephone visit.  Blood  Pressure Weight BMI  BP Readings from Last 3 Encounters:  12/10/20 100/70  11/27/20 135/84  11/21/19 134/79   Wt Readings from Last 3 Encounters:  09/12/21 156 lb 1.6 oz (70.8 kg)  02/11/21 168 lb (76.2 kg)  12/13/20 174 lb (78.9 kg)   BMI Readings from Last 1 Encounters:  09/12/21 23.39 kg/m    *Unable to obtain current vital signs, weight, and BMI due to telephone visit type  Hearing/Vision  Paige Lozano did not seem to have difficulty with hearing/understanding during the telephone conversation Reports that Paige Lozano has not had a formal eye exam by an eye care professional within the past year Reports that Paige Lozano has not had a formal hearing evaluation within the past year *Unable to fully assess hearing and vision during telephone visit type  Cognitive Function: 6CIT Screen 09/12/2021  What Year? 0 points  What month? 0 points  What time? 0 points  Count back from 20 2 points  Months in reverse 0 points  Repeat phrase 2 points  Total Score 4   (Normal:0-7, Significant for Dysfunction: >8)  Normal Cognitive Function Screening: Yes   Immunization & Health Maintenance Record Immunization History  Administered Date(s) Administered   Influenza,inj,Quad PF,6+ Mos 12/06/2018   Influenza,inj,quad, With Preservative 08/20/2009   Influenza-Unspecified 07/10/2016   Moderna Sars-Covid-2 Vaccination 02/17/2020, 03/16/2020   Pneumococcal Polysaccharide-23 12/17/2015   Tdap 05/25/2011, 08/04/2018, 10/11/2020    Health Maintenance  Topic Date Due   PAP SMEAR-Modifier  09/12/2021 (Originally 10/06/1987)   COVID-19 Vaccine (3 - Moderna risk series) 09/28/2021 (Originally 04/13/2020)   Zoster Vaccines- Shingrix (1 of 2) 12/13/2021 (Originally 10/05/1985)   INFLUENZA VACCINE  02/06/2022 (Originally 06/09/2021)   Pneumococcal Vaccine 51-63 Years old (2 - PCV) 09/12/2022  (Originally 12/16/2016)   MAMMOGRAM  09/12/2022 (Originally 12/08/2020)   DEXA SCAN  09/12/2022 (Originally 05-14-1966)   Hepatitis C Screening  09/12/2022 (Originally 10/05/1984)   HIV Screening  09/12/2022 (Originally 10/05/1981)   TETANUS/TDAP  10/11/2030   COLONOSCOPY (Pts 45-64yrs Insurance coverage will need to be confirmed)  07/03/2031   HPV VACCINES  Aged Out       Assessment  This is a routine wellness examination for Paige Lozano.  Health Maintenance: Due or Overdue There are no preventive care reminders to display for this patient.   Paige Lozano does not need a referral for Community Assistance: Care Management:   no Social Work:    no Prescription Assistance:  no Nutrition/Diabetes Education:  no   Plan:  Personalized Goals  Goals Addressed               This Visit's Progress     Patient Stated (pt-stated)        09/12/2021 AWV Goal: Improved Nutrition/Diet  Patient will verbalize understanding that diet plays an important role in overall health and that a poor diet is a risk factor for many chronic medical conditions.  Over the next year, patient will improve self management of their diet by incorporating improved meal pattern. Patient will utilize available community resources to help with food acquisition if needed (ex: food pantries, [address], etc) Patient will work with nutrition specialist if a referral was made        Personalized Health Maintenance & Screening Recommendations  Pneumococcal vaccine  Influenza vaccine Screening mammography Screening Pap smear and pelvic exam  Bone densitometry screening Shingles vaccine Eye exam Hearing exam  Lung Cancer Screening Recommended: no (Low Dose CT Chest recommended if Age 57-80 years, 30 pack-year currently  smoking OR have quit w/in past 15 years) Hepatitis C Screening recommended: yes HIV Screening recommended: yes  Advanced Directives: Written information was not prepared per patient's  request.  Referrals & Orders Orders Placed This Encounter  Procedures   Mammogram 3D SCREEN BREAST BILATERAL     Follow-up Plan Follow-up with Donella Stade, PA-C as planned Schedule your your shingles vaccine at the pharmacy.  Pneumonia vaccine can be done in the office after you discuss it with Jade.  Flu vaccine can also be done in the office or the pharmacy after you are feeling better. Mammogram referral has been sent.  Please schedule your eye exam and hearing exam.  Pap smear and Bone density can be scheduled after you discuss it with Jade. Medicare wellness visit in one year. AVS printed and mailed to the patient.   I have personally reviewed and noted the following in the patient's chart:   Medical and social history Use of alcohol, tobacco or illicit drugs  Current medications and supplements Functional ability and status Nutritional status Physical activity Advanced directives List of other physicians Hospitalizations, surgeries, and ER visits in previous 12 months Vitals Screenings to include cognitive, depression, and falls Referrals and appointments  In addition, I have reviewed and discussed with Paige Lozano certain preventive protocols, quality metrics, and best practice recommendations. A written personalized care plan for preventive services as well as general preventive health recommendations is available and can be mailed to the patient at her request.      Tinnie Gens, RN  09/12/2021

## 2021-09-15 ENCOUNTER — Telehealth (HOSPITAL_COMMUNITY): Payer: Medicare PPO | Admitting: Psychiatry

## 2021-09-23 ENCOUNTER — Telehealth (INDEPENDENT_AMBULATORY_CARE_PROVIDER_SITE_OTHER): Payer: Medicare PPO | Admitting: Psychiatry

## 2021-09-23 ENCOUNTER — Encounter (HOSPITAL_COMMUNITY): Payer: Self-pay | Admitting: Psychiatry

## 2021-09-23 DIAGNOSIS — F411 Generalized anxiety disorder: Secondary | ICD-10-CM | POA: Diagnosis not present

## 2021-09-23 DIAGNOSIS — F3181 Bipolar II disorder: Secondary | ICD-10-CM

## 2021-09-23 DIAGNOSIS — F5102 Adjustment insomnia: Secondary | ICD-10-CM | POA: Diagnosis not present

## 2021-09-23 NOTE — Progress Notes (Addendum)
[facility] Outpatient Follow up visit   Patient Identification: Paige Lozano MRN:  [id number] Date of Evaluation:  09/23/2021 Referral Source: Luvenia Starch Primary care Chief Complaint:    bipolar follow up , depression Visit Diagnosis:    ICD-10-CM   1. Bipolar 2 disorder (HCC)  F31.81     2. Adjustment insomnia  F51.02     3. GAD (generalized anxiety disorder)  F41.1     Virtual Visit via Video Note  I connected with Paige Lozano on 09/23/21 at  4:30 PM EST by a video enabled telemedicine application and verified that I am speaking with the correct person using two identifiers.  Location: Patient: home Provider: office   I discussed the limitations of evaluation and management by telemedicine and the availability of in person appointments. The patient expressed understanding and agreed to proceed.     I discussed the assessment and treatment plan with the patient. The patient was provided an opportunity to ask questions and all were answered. The patient agreed with the plan and demonstrated an understanding of the instructions.   The patient was advised to call back or seek an in-person evaluation if the symptoms worsen or if the condition fails to improve as anticipated.  I provided 15  minutes of non-face-to-face time during this encounter.     lHistory of Present Illness:    [place], difficult year with finances, MS and mom sickness Seroquel helps with sleep  Lamictal at a reasonable dose Klonopine helps anxiety Gets distracted and worriful due to stressors Understands not to use THC and it can effect meds and mood Overall feel circumstances are challenging but meds doing what it can    Aggravating factor: surgeries . Multiple medical .surgeries, breakup in past, MS Modifying factor: dogs   Duration more then 10 years     Previous Psychotropic Medications: Yes   Substance Abuse History in the last 12 months:  Yes.   as per  history Marijuana says it is medical for her condition.  Consequences of Substance Abuse: Medical Consequences:  fatigue, poor concenctration  Past Medical History:  Past Medical History:  Diagnosis Date   Anxiety    Bipolar 2 disorder, major depressive episode ([place])    Complication of anesthesia    COPD (chronic obstructive pulmonary disease) (HCC)    smoker   Fibromyalgia    Insomnia    Multiple sclerosis (HCC)    PONV (postoperative nausea and vomiting)    Pulmonary embolism ([place]) 2000   Sleep apnea    mild OSA no CPAP   Ulcerative colitis ([place])     Past Surgical History:  Procedure Laterality Date   FOOT SURGERY Left    bone spur   HEMORRHOID SURGERY     HUMERUS FRACTURE SURGERY Left    MYOMECTOMY     NASAL SINUS SURGERY     SHOULDER ARTHROSCOPY WITH SUBACROMIAL DECOMPRESSION AND BICEP TENDON REPAIR Left 07/27/2016   Procedure: SHOULDER ARTHROSCOPY DEBRIDEMENT ROTATOR CUFF AND LABRUM, SUBACROMIAL DECOMPRESSION, BICEPS TENODESIS;  Surgeon: Tania Ade, MD;  Location: [place];  Service: Orthopedics;  Laterality: Left;  SHOULDER ARTHROSCOPY DEBRIDEMENT ROTATOR CUFF AND LABRUM, SUBACROMIAL DECOMPRESSION, BICEPS TENOTOMY, POSSIBLE TENODESIS   THUMB ARTHROSCOPY     5 surgeries on left thumb, 4 surgeries on right   TONSILLECTOMY AND ADENOIDECTOMY       Family History:  Family History  Adopted: Yes  Problem Relation Age of Onset   Breast cancer Sister  Triple Negative    Social History:   Social History   Socioeconomic History   Marital status: Divorced    Spouse name: Not on file   Number of children: 1   Years of education: 16   Highest education level: Bachelor's degree (e.g., BA, AB, BS)  Occupational History   Occupation: Legally disabled  Tobacco Use   Smoking status: Former    Packs/day: 0.25    Types: Cigarettes    Quit date: 07/08/2016    Years since quitting: 5.2   Smokeless tobacco: Never  Vaping Use   Vaping Use:  Every day   Substances: Nicotine  Substance and Sexual Activity   Alcohol use: Yes    Alcohol/week: 0.0 standard drinks    Comment: rare    Drug use: No    Types: Marijuana    Comment: 09/05/21   Sexual activity: Yes    Partners: Male    Birth control/protection: None  Other Topics Concern   Not on file  Social History Narrative   Lives alone with her two dogs. She had a daughter who has passed away. Likes to work out and enjoys spending time with friends.   Social Determinants of Health   Financial Resource Strain: Medium Risk   Difficulty of Paying Living Expenses: Somewhat hard  Food Insecurity: Food Insecurity Present   Worried About [place] in the Last Year: Sometimes true   Ran Out of Food in the Last Year: Sometimes true  Transportation Needs: No Transportation Needs   Lack of Transportation (Medical): No   Lack of Transportation (Non-Medical): No  Physical Activity: Sufficiently Active   Days of Exercise per Week: 5 days   Minutes of Exercise per Session: 120 min  Stress: Stress Concern Present   Feeling of Stress : Rather much  Social Connections: Moderately Isolated   Frequency of Communication with Friends and Family: More than three times a week   Frequency of Social Gatherings with Friends and Family: More than three times a week   Attends Religious Services: 1 to 4 times per year   Active Member of [employer] or Organizations: No   Attends Archivist Meetings: Never   Marital Status: Divorced     Allergies:   Allergies  Allergen Reactions   Ketamine Anaphylaxis   Other Anaphylaxis, Hives and Swelling    curry curry   Oxycodone Diarrhea and Other (See Comments)   Papaya Enzyme Anaphylaxis   Digestive Enzymes Swelling    Other reaction(s): Throat Swelling    Metabolic Disorder Labs: No results found for: HGBA1C, MPG No results found for: PROLACTIN No results found for: CHOL, TRIG, HDL, CHOLHDL, VLDL, LDLCALC   Current  Medications: Current Outpatient Medications  Medication Sig Dispense Refill   albuterol (VENTOLIN HFA) 108 (90 Base) MCG/ACT inhaler INHALE 2 PUFFS INTO THE LUNGS EVERY 6 HOURS AS NEEDED FOR WHEEZING OR SHORTNESS OF BREATH 6.7 g 0   Biotin 10 MG CAPS Take by mouth daily.     calcium carbonate (OSCAL) 1500 (600 Ca) MG TABS tablet Take 600 mg by mouth in the morning and at bedtime.     Cholecalciferol 25 MCG (1000 UT) tablet Take by mouth.     clonazePAM (KLONOPIN) 0.5 MG tablet Take 1 tablet (0.5 mg total) by mouth in the morning and at bedtime. 180 tablet 1   ferrous sulfate 325 (65 FE) MG tablet Take 1 tablet by mouth daily.     fluticasone (FLONASE) 50 MCG/ACT nasal  spray Place 2 sprays into both nostrils daily. 16 g 3   folic acid (FOLVITE) 1 MG tablet Take 1 mg by mouth daily.     gabapentin (NEURONTIN) 300 MG capsule Take 300 mg by mouth in the morning and at bedtime. (Patient not taking: Reported on 09/12/2021)     ibuprofen (ADVIL) 800 MG tablet Take 800 mg by mouth as needed.     lamoTRIgine (LAMICTAL) 200 MG tablet Take 1 tablet (200 mg total) by mouth 2 (two) times daily. Just a bridge rx until mail order comes 180 tablet 0   natalizumab (TYSABRI) 300 MG/15ML injection Inject into the vein.     nicotine (NICODERM CQ) 21 mg/24hr patch Place 1 patch (21 mg total) onto the skin daily. 90 patch 0   QUEtiapine (SEROQUEL) 400 MG tablet Take 1 tablet (400 mg total) by mouth daily. 90 tablet 0   sulfaSALAzine (AZULFIDINE) 500 MG tablet Take by mouth.     vitamin B-12 (CYANOCOBALAMIN) 1000 MCG tablet Take 1,000 mcg by mouth daily. (Patient not taking: Reported on 09/12/2021)     vitamin E 1000 UNIT capsule Take 1,000 Units by mouth daily.     No current facility-administered medications for this visit.      Psychiatric Specialty Exam: Review of Systems  Psychiatric/Behavioral:  Negative for suicidal ideas.    There were no vitals taken for this visit.There is no height or weight on file  to calculate BMI.  General Appearance: Casual  Eye Contact: Fair  Speech:  Normal Rate  Volume:  normal  Mood: gets stressed  Affect:  congruent  Thought Process:  Goal Directed  Orientation:  Full (Time, Place, and Person)  Thought Content:  Logical  Suicidal Thoughts:  No  Homicidal Thoughts:  No  Memory:  Immediate;   Fair Recent;   Fair  Judgement:  Fair  Insight:  Fair  Psychomotor Activity:  Normal  Concentration:  Concentration: Fair and Attention Span: Fair  Recall:  [employer] of Knowledge:Fair  Language: Fair  Akathisia:  Negative  Handed:  Right  AIMS (if indicated):    Assets:  Desire for Improvement  ADL's:  Intact  Cognition: WNL  Sleep:  Fair while on meds    Treatment Plan Summary: Medication management and Plan as follows   Prior documentation reviewed Bipolar 2: depressed; gets subdued, working on positive thinking, continue lamictal, seroquel Reviewed side effects  GAD: see above, anxious due to family concerns, continue klonopine, trying to focus on [place] positive thinkgs   Insomnia: fluctuates, seroquel helps Fu 2 m , consider therapy to deal with stressors   Merian Capron, MD 11/15/20224:49 PM

## 2021-10-08 ENCOUNTER — Ambulatory Visit: Payer: Medicare PPO

## 2021-11-09 HISTORY — PX: WRIST FRACTURE SURGERY: SHX121

## 2021-11-20 DIAGNOSIS — M1711 Unilateral primary osteoarthritis, right knee: Secondary | ICD-10-CM | POA: Diagnosis not present

## 2021-11-20 DIAGNOSIS — M25461 Effusion, right knee: Secondary | ICD-10-CM | POA: Diagnosis not present

## 2021-11-20 DIAGNOSIS — R799 Abnormal finding of blood chemistry, unspecified: Secondary | ICD-10-CM | POA: Diagnosis not present

## 2021-11-20 DIAGNOSIS — R936 Abnormal findings on diagnostic imaging of limbs: Secondary | ICD-10-CM | POA: Diagnosis not present

## 2021-11-25 ENCOUNTER — Encounter: Payer: Self-pay | Admitting: Physician Assistant

## 2021-11-25 ENCOUNTER — Ambulatory Visit (INDEPENDENT_AMBULATORY_CARE_PROVIDER_SITE_OTHER): Payer: Medicare HMO | Admitting: Physician Assistant

## 2021-11-25 ENCOUNTER — Ambulatory Visit (INDEPENDENT_AMBULATORY_CARE_PROVIDER_SITE_OTHER): Payer: Medicare HMO

## 2021-11-25 ENCOUNTER — Other Ambulatory Visit: Payer: Self-pay

## 2021-11-25 VITALS — BP 121/68 | HR 87 | Ht 68.5 in | Wt 160.0 lb

## 2021-11-25 DIAGNOSIS — Z716 Tobacco abuse counseling: Secondary | ICD-10-CM

## 2021-11-25 DIAGNOSIS — M79644 Pain in right finger(s): Secondary | ICD-10-CM

## 2021-11-25 DIAGNOSIS — F411 Generalized anxiety disorder: Secondary | ICD-10-CM | POA: Diagnosis not present

## 2021-11-25 DIAGNOSIS — M79641 Pain in right hand: Secondary | ICD-10-CM | POA: Diagnosis not present

## 2021-11-25 DIAGNOSIS — F17299 Nicotine dependence, other tobacco product, with unspecified nicotine-induced disorders: Secondary | ICD-10-CM

## 2021-11-25 DIAGNOSIS — G8929 Other chronic pain: Secondary | ICD-10-CM | POA: Diagnosis not present

## 2021-11-25 DIAGNOSIS — I7300 Raynaud's syndrome without gangrene: Secondary | ICD-10-CM | POA: Diagnosis not present

## 2021-11-25 DIAGNOSIS — M79645 Pain in left finger(s): Secondary | ICD-10-CM

## 2021-11-25 DIAGNOSIS — Z981 Arthrodesis status: Secondary | ICD-10-CM | POA: Diagnosis not present

## 2021-11-25 MED ORDER — DICLOFENAC SODIUM 1 % EX GEL: 4.0000 g | Freq: Four times a day (QID) | CUTANEOUS | 1 refills | Status: DC

## 2021-11-25 MED ORDER — HYDROCODONE-ACETAMINOPHEN 5-325 MG PO TABS: 1.0000 | ORAL_TABLET | Freq: Four times a day (QID) | ORAL | 0 refills | Status: DC | PRN

## 2021-11-25 MED ORDER — VARENICLINE TARTRATE 0.5 MG PO TABS: 0.5000 mg | ORAL_TABLET | Freq: Two times a day (BID) | ORAL | 0 refills | Status: DC

## 2021-11-25 MED ORDER — VARENICLINE TARTRATE 1 MG PO TABS: 1.0000 mg | ORAL_TABLET | Freq: Two times a day (BID) | ORAL | 3 refills | Status: DC

## 2021-11-25 NOTE — Patient Instructions (Addendum)
Trigger Finger Trigger finger, also called stenosing tenosynovitis,  is a condition that causes a finger to get stuck in a bent position. Each finger has a tendon, which is a tough, cord-like tissue that connects muscle to bone, and each tendon passes through a tunnel of tissue called a tendon sheath. To move your finger, your tendon needs to glide freely through the sheath. Trigger finger happens when the tendon or the sheath thickens, making it difficult to move your finger. Trigger finger can affect any finger or a thumb. It may affect more than one finger. Mild cases may clear up with rest and medicine. Severe cases require more treatment. What are the causes? Trigger finger is caused by a thickened finger tendon or tendon sheath. The cause of this thickening is not known. What increases the risk? The following factors may make you more likely to develop this condition: Doing activities that require a strong grip. Having rheumatoid arthritis, gout, or diabetes. Being 74-79 years old. Being female. What are the signs or symptoms? Symptoms of this condition include: Pain when bending or straightening your finger. Tenderness or swelling where your finger attaches to the palm of your hand. A lump in the palm of your hand or on the inside of your finger. Hearing a noise like a pop or a snap when you try to straighten your finger. Feeling a catching or locking sensation when you try to straighten your finger. Being unable to straighten your finger. How is this diagnosed? This condition is diagnosed based on your symptoms and a physical exam. How is this treated? This condition may be treated by: Resting your finger and avoiding activities that make symptoms worse. Wearing a finger splint to keep your finger extended. Taking NSAIDs, such as ibuprofen, to relieve pain and swelling. Doing gentle exercises to stretch the finger as told by your health care provider. Having medicine that reduces  swelling and inflammation (steroids) injected into the tendon sheath. Injections may need to be repeated. Having surgery to open the tendon sheath. This may be done if other treatments do not work and you cannot straighten your finger. You may need physical therapy after surgery. Follow these instructions at home: If you have a splint: Wear the splint as told by your health care provider. Remove it only as told by your health care provider. Loosen it if your fingers tingle, become numb, or turn cold and blue. Keep it clean. If the splint is not waterproof: Do not let it get wet. Cover it with a watertight covering when you take a bath or shower. Managing pain, stiffness, and swelling   If directed, apply heat to the affected area as often as told by your health care provider. Use the heat source that your health care provider recommends, such as a moist heat pack or a heating pad. Place a towel between your skin and the heat source. Leave the heat on for 20-30 minutes. Remove the heat if your skin turns bright red. This is especially important if you are unable to feel pain, heat, or cold. You may have a greater risk of getting burned. If directed, put ice on the painful area. To do this: If you have a removable splint, remove it as told by your health care provider. Put ice in a plastic bag. Place a towel between your skin and the bag or between your splint and the bag. Leave the ice on for 20 minutes, 2-3 times a day.  Activity Rest your finger as  told by your health care provider. Avoid activities that make the pain worse. Return to your normal activities as told by your health care provider. Ask your health care provider what activities are safe for you. Do exercises as told by your health care provider. Ask your health care provider when it is safe to drive if you have a splint on your hand. General instructions Take over-the-counter and prescription medicines only as told by your  health care provider. Keep all follow-up visits as told by your health care provider. This is important. Contact a health care provider if: Your symptoms are not improving with home care. Summary Trigger finger, also called stenosing tenosynovitis, causes your finger to get stuck in a bent position. This can make it difficult and painful to straighten your finger. This condition develops when a finger tendon or tendon sheath thickens. Treatment may include resting your finger, wearing a splint, and taking medicines. In severe cases, surgery to open the tendon sheath may be needed. This information is not intended to replace advice given to you by your health care provider. Make sure you discuss any questions you have with your health care provider. Document Revised: 03/13/2019 Document Reviewed: 03/13/2019 Elsevier Patient Education  2022 [place] Raynaud's phenomenon is a condition that affects the blood vessels (arteries) that carry blood to the fingers and toes. The arteries that supply blood to the ears, lips, nipples, or the tip of the nose might also be affected. Raynaud's phenomenon causes the arteries to become narrow temporarily (spasm). As a result, the flow of blood to the affected areas is temporarily decreased. This usually occurs in response to cold temperatures or stress. During an attack, the skin in the affected areas turns white, then blue, and finally red. A person may also feel tingling or numbness in those areas. Attacks usually last for only a brief period, and then the blood flow to the area returns to normal. In most cases, Raynaud's phenomenon does not cause serious health problems. What are the causes? In many cases, the cause of this condition is not known. The condition may occur on its own (primary Raynaud's phenomenon) or may be associated with other diseases or factors (secondary Raynaud's phenomenon). Possible causes may  include: Diseases or medical conditions that damage the arteries. Injuries and repetitive actions that hurt the hands or feet. Being exposed to certain chemicals. Taking medicines that narrow the arteries. Other medical conditions, such as lupus, scleroderma, rheumatoid arthritis, thyroid problems, blood disorders, Sjogren syndrome, or atherosclerosis. What increases the risk? The following factors may make you more likely to develop this condition: Being 92-81 years old. Being female. Having a family history of Raynaud's phenomenon. Living in a cold climate. Smoking. What are the signs or symptoms? Symptoms of this condition usually occur when you are exposed to cold temperatures or when you have emotional stress. The symptoms may last for a few minutes or up to several hours. They usually affect your fingers but may also affect your toes, nipples, lips, ears, or the tip of your nose. Symptoms may include: Changes in skin color. The skin in the affected areas will turn pale or white. The skin may then change from white to bluish to red as normal blood flow returns to the area. Numbness, tingling, or pain in the affected areas. In severe cases, symptoms may include: Skin sores. Tissues decaying and dying (gangrene). How is this diagnosed? This condition may be diagnosed based on: Your symptoms and  medical history. A physical exam. During the exam, you may be asked to put your hands in cold water to check for a reaction to cold temperature. Tests, such as: Blood tests to check for other diseases or conditions. A test to check the movement of blood through your arteries and veins (vascular ultrasound). A test in which the skin at the base of your fingernail is examined under a microscope (nailfold capillaroscopy). How is this treated? During an episode, you can take actions to help symptoms go away faster. Options include moving your arms around in a windmill pattern, warming your fingers  under warm water, or placing your fingers in a warm body fold, such as your armpit. Long-term treatment for this condition often involves making lifestyle changes and taking steps to control your exposure to cold temperature. For more severe cases, medicine (calcium channel blockers) may be used to improve blood circulation. Follow these instructions at home: Avoiding cold temperatures Take these steps to avoid exposure to cold: If possible, stay indoors during cold weather. When you go outside during cold weather, dress in layers and wear mittens, a hat, a scarf, and warm footwear. Wear mittens or gloves when handling ice or frozen food. Use holders for glasses or cans containing cold drinks. Let warm water run for a while before taking a shower or bath. Warm up the car before driving in cold weather. Lifestyle If possible, avoid stressful and emotional situations. Try to find ways to manage your stress, such as: Exercise. Yoga. Meditation. Biofeedback. Do not use any products that contain nicotine or tobacco. These products include cigarettes, chewing tobacco, and vaping devices, such as e-cigarettes. If you need help quitting, ask your health care provider. Avoid secondhand smoke. Limit your use of caffeine. Switch to decaffeinated coffee, tea, and soda. Avoid chocolate. Avoid vibrating tools and machinery. General instructions Protect your hands and feet from injuries, cuts, or bruises. Avoid wearing tight rings or wristbands. Wear loose fitting socks and comfortable, roomy shoes. Take over-the-counter and prescription medicines only as told by your health care provider. Where to find support Raynaud's Association: www.raynauds.org Where to find more information [employer] of Arthritis and Musculoskeletal and Skin Diseases: www.niams.SouthExposed.es Contact a health care provider if: Your discomfort becomes worse despite lifestyle changes. You develop sores on your fingers or  toes that do not heal. You have breaks in the skin on your fingers or toes. You have a fever. You have pain or swelling in your joints. You have a rash. Your symptoms occur on only one side of your body. Get help right away if: Your fingers or toes turn black. You have severe pain in the affected areas. These symptoms may represent a serious problem that is an emergency. Do not wait to see if the symptoms will go away. Get medical help right away. Call your local emergency services (911 in the [place] Do not drive yourself to the hospital. Summary Raynaud's phenomenon is a condition that affects the arteries that carry blood to the fingers, toes, ears, lips, nipples, or the tip of the nose. In many cases, the cause of this condition is not known. Symptoms of this condition include changes in skin color along with numbness and tingling in the affected area. Treatment for this condition includes lifestyle changes and reducing exposure to cold temperatures. Medicines may be used for severe cases of the condition. Contact your health care provider if your condition worsens despite treatment. This information is not intended to replace advice given to  you by your health care provider. Make sure you discuss any questions you have with your health care provider. Document Revised: 12/31/2020 Document Reviewed: 12/31/2020 Elsevier Patient Education  2022 [employer].

## 2021-11-25 NOTE — Progress Notes (Signed)
Subjective:    Patient ID: Paige Lozano, female    DOB: [birth year], 56 y.o.   MRN: [id number]  HPI Pt is a 56 yo female with COPD, OSA, UC, MS, Bipolar 2 who presents to the clinic to discuss concerns.   She is having a knee replacement on right knee in next 4 weeks and would like to not be smoking. She wants help. Chantix is the only thing that has worked in the past. She never tried patches.   She brings in pictures of occasional right middle finger going completely white and numb. Not always in response to cold but happening more frequently now about once every couple of weeks.   She now has pain at the base of the right middle finger and at times feels like it is going to "lock". It has not locked. Tramadol does not help. Cannot take NSAIDs. It is getting progressively more painful. No trauma.   Needs refills of xanax.   .. 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   Insomnia 05/29/2016   Ulcerative pancolitis without complication ([place]) 25/49/8264   Bipolar 2 disorder ([place]) 05/29/2016   Nicotine dependence 05/29/2016   Primary osteoarthritis of right knee with recent ACL tear and femoral condylar impaction fracture 06/01/2016   Anxiety state 06/01/2016   Left shoulder pain 06/01/2016   GAD (generalized anxiety disorder) 07/20/2016   History of pulmonary embolus (PE) 07/21/2016   Right elbow pain 07/21/2016   Hot flashes 12/29/2016   Body aches 12/29/2016   Pruritus 12/29/2016   Palpitations 03/08/2017   Chest tightness 03/08/2017   Tachycardia 03/08/2017   Sinus tachycardia 03/12/2017   PAC (premature atrial contraction) 03/12/2017   Lumbar degenerative disc disease 06/02/2017   Plantar fasciitis, bilateral 10/29/2017   Onychomycosis 02/17/2018   Neurodermatitis 03/29/2018   Subcutaneous mass 04/07/2018   Family history of breast cancer 12/02/2018   Acute  stress reaction 03/13/2019   Fracture of second and third metatarsals of the right foot 07/14/2019   Left wrist injury 07/31/2019   Thyroid disorder screen 09/04/2019   Actinic keratoses 09/04/2019   Abrasion 11/22/2019   History of lipoma 11/22/2019   Air hunger 11/28/2020   SOB (shortness of breath) 11/28/2020   Right bundle branch block 11/28/2020   Ulcerative colitis ([place])    Sleep apnea    PONV (postoperative nausea and vomiting)    Pulmonary embolism ([place]) 2000   COPD (chronic obstructive pulmonary disease) ([place])    Complication of anesthesia    Bipolar 2 disorder, major depressive episode ([place])    Anxiety    Cervical radiculopathy 12/13/2020   Inflamed sebaceous cyst 02/11/2021   Raynaud's phenomenon without gangrene 08/29/2021   Stress at home 08/29/2021   Pain of left middle finger 11/28/2021   Resolved Ambulatory Problems    Diagnosis Date Noted   Right lateral epicondylitis 07/21/2016   Sinusitis 11/30/2016   Right wrist injury 12/29/2016   Hand injury, left, subsequent encounter 12/30/2016   Chills (without fever) 03/08/2017   No Additional Past Medical History     Review of Systems   See HPI.  Objective:   Physical Exam  Pictures that show white right middle finger. Finger looks great today.  Pain to palpation at the base of the right middle finger on the palm side.       Assessment & Plan:  Marland KitchenMarland KitchenSinclaire was seen today for  follow-up.  Diagnoses and all orders for this visit:  Pain of right middle finger -     DG Hand Complete Right; Future -     diclofenac Sodium (VOLTAREN) 1 % GEL; Apply 4 g topically 4 (four) times daily. To affected joint. -     HYDROcodone-acetaminophen (NORCO/VICODIN) 5-325 MG tablet; Take 1 tablet by mouth every 6 (six) hours as needed for moderate pain.  Encounter for smoking cessation counseling -     varenicline (CHANTIX) 0.5 MG tablet; Take 1 tablet (0.5 mg total) by mouth 2 (two) times daily. -     varenicline (CHANTIX) 1  MG tablet; Take 1 tablet (1 mg total) by mouth 2 (two) times daily.  Raynaud's phenomenon without gangrene  Other tobacco product nicotine dependence with nicotine-induced disorder -     varenicline (CHANTIX) 0.5 MG tablet; Take 1 tablet (0.5 mg total) by mouth 2 (two) times daily. -     varenicline (CHANTIX) 1 MG tablet; Take 1 tablet (1 mg total) by mouth 2 (two) times daily.  Anxiety state   Likely raynauds. HO given.  Smoking cessation could help Limit caffeine Raynauds is associated with autoimmune diseases  Discussed smoking cessation Start chantix Patches and wellbutrin failed Stop smoking over the next month.   Will get xray.  Voltaren gel HO given for trigger finger  Sounds like could be the start Pt is going out of town and wants to feel better Follow up with Dr. Darene Lozano on friday  .Marland KitchenPDMP reviewed during this encounter. Small quanity of norco given as needed for pain break through

## 2021-11-26 ENCOUNTER — Encounter: Payer: Self-pay | Admitting: Physician Assistant

## 2021-11-26 NOTE — Progress Notes (Signed)
No acute or erosive changes noted over area of pain. Treatment plan stays the same.

## 2021-11-27 ENCOUNTER — Other Ambulatory Visit (HOSPITAL_COMMUNITY): Payer: Self-pay | Admitting: Psychiatry

## 2021-11-27 DIAGNOSIS — F5101 Primary insomnia: Secondary | ICD-10-CM

## 2021-11-28 ENCOUNTER — Other Ambulatory Visit: Payer: Self-pay

## 2021-11-28 ENCOUNTER — Ambulatory Visit (INDEPENDENT_AMBULATORY_CARE_PROVIDER_SITE_OTHER): Payer: Medicare HMO

## 2021-11-28 ENCOUNTER — Ambulatory Visit (INDEPENDENT_AMBULATORY_CARE_PROVIDER_SITE_OTHER): Payer: Medicare HMO | Admitting: Sports Medicine

## 2021-11-28 DIAGNOSIS — M65841 Other synovitis and tenosynovitis, right hand: Secondary | ICD-10-CM

## 2021-11-28 DIAGNOSIS — G35 Multiple sclerosis: Secondary | ICD-10-CM | POA: Diagnosis not present

## 2021-11-28 DIAGNOSIS — M79645 Pain in left finger(s): Secondary | ICD-10-CM | POA: Insufficient documentation

## 2021-11-28 NOTE — Assessment & Plan Note (Signed)
This is a pleasant 56 year old female, increasing pain volar right hand third finger, no trauma. On exam she has moderate swelling and tenderness, palpable flexor tendon nodule, she is not at the point yet where she has triggering but there is certainly a tenosynovitis. She does desire aggressive treatment today so we did a third flexor sheath injection with ultrasound guidance, home conditioning given, return to see me in 4 to 6 weeks as needed.

## 2021-11-28 NOTE — Progress Notes (Signed)
° ° °  Procedures performed today:    Procedure: Real-time Ultrasound Guided injection of the right third flexor tendon sheath Device: Samsung HS60  Verbal informed consent obtained.  Time-out conducted.  Noted no overlying erythema, induration, or other signs of local infection.  Skin prepped in a sterile fashion.  Local anesthesia: Topical Ethyl chloride.  With sterile technique and under real time ultrasound guidance: Noted sheath effusion,0.5 cc Kenalog 40, 0.5 cc lidocaine injected easily Completed without difficulty  Advised to call if fevers/chills, erythema, induration, drainage, or persistent bleeding.  Images permanently stored and available for review in PACS.  Impression: Technically successful ultrasound guided injection.  Independent interpretation of notes and tests performed by another provider:   None.  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 On exam she has moderate swelling and tenderness, palpable flexor tendon nodule, she is not at the point yet where she has triggering but there is certainly a tenosynovitis. She does desire aggressive treatment today so we did a third flexor sheath injection with ultrasound guidance, home conditioning given, return to see me in 4 to 6 weeks as needed.   ___________________________________________ Gwen Her. Dianah Field, M.D., ABFM., CAQSM. Primary Care and [place] Instructor of [place] of [facility] of Medicine

## 2021-12-02 ENCOUNTER — Other Ambulatory Visit (HOSPITAL_COMMUNITY): Payer: Self-pay

## 2021-12-02 ENCOUNTER — Telehealth (HOSPITAL_COMMUNITY): Payer: Self-pay

## 2021-12-02 DIAGNOSIS — F5101 Primary insomnia: Secondary | ICD-10-CM

## 2021-12-02 MED ORDER — LAMOTRIGINE 200 MG PO TABS
200.0000 mg | ORAL_TABLET | Freq: Two times a day (BID) | ORAL | 0 refills | Status: DC
Start: 2021-12-02 — End: 2022-03-19

## 2021-12-02 MED ORDER — LAMOTRIGINE 200 MG PO TABS: 200.0000 mg | ORAL_TABLET | Freq: Two times a day (BID) | ORAL | 0 refills | Status: DC

## 2021-12-02 MED ORDER — QUETIAPINE FUMARATE 400 MG PO TABS: 400.0000 mg | ORAL_TABLET | Freq: Every day | ORAL | 0 refills | Status: DC

## 2021-12-02 NOTE — Telephone Encounter (Signed)
Patient states that she has not received Seroquel and Lamictal from home delivery pharmacy and is out of meds. Wanted a 30 day supply sent to a [facility]. I sent a 30 day supply of both medications to the pharmacy the pt requested until she receives her mail order. Nothing further is needed at this time.

## 2021-12-10 HISTORY — PX: KNEE SURGERY: SHX244

## 2021-12-16 DIAGNOSIS — R52 Pain, unspecified: Secondary | ICD-10-CM | POA: Diagnosis not present

## 2021-12-16 DIAGNOSIS — M1711 Unilateral primary osteoarthritis, right knee: Secondary | ICD-10-CM | POA: Diagnosis not present

## 2021-12-22 ENCOUNTER — Telehealth (HOSPITAL_COMMUNITY): Payer: Medicare PPO | Admitting: Psychiatry

## 2021-12-23 DIAGNOSIS — H524 Presbyopia: Secondary | ICD-10-CM | POA: Diagnosis not present

## 2021-12-26 ENCOUNTER — Telehealth (INDEPENDENT_AMBULATORY_CARE_PROVIDER_SITE_OTHER): Payer: Medicare HMO | Admitting: Psychiatry

## 2021-12-26 ENCOUNTER — Encounter (HOSPITAL_COMMUNITY): Payer: Self-pay | Admitting: Psychiatry

## 2021-12-26 DIAGNOSIS — F3181 Bipolar II disorder: Secondary | ICD-10-CM | POA: Diagnosis not present

## 2021-12-26 DIAGNOSIS — F5102 Adjustment insomnia: Secondary | ICD-10-CM | POA: Diagnosis not present

## 2021-12-26 DIAGNOSIS — F411 Generalized anxiety disorder: Secondary | ICD-10-CM

## 2021-12-26 NOTE — Progress Notes (Signed)
[facility] Outpatient Follow up visit   Patient Identification: Paige Lozano MRN:  [id number] Date of Evaluation:  12/26/2021 Referral Source: Luvenia Starch Primary care Chief Complaint:    bipolar follow up , depression Visit Diagnosis:    ICD-10-CM   1. Bipolar 2 disorder (HCC)  F31.81     2. Adjustment insomnia  F51.02     3. GAD (generalized anxiety disorder)  F41.1     Virtual Visit via Telephone Note  I connected with Paige Lozano on 12/26/21 at  1:00 PM EST by telephone and verified that I am speaking with the correct person using two identifiers.  Location: Patient: home Provider: home office   I discussed the limitations, risks, security and privacy concerns of performing an evaluation and management service by telephone and the availability of in person appointments. I also discussed with the patient that there may be a patient responsible charge related to this service. The patient expressed understanding and agreed to proceed.     I discussed the assessment and treatment plan with the patient. The patient was provided an opportunity to ask questions and all were answered. The patient agreed with the plan and demonstrated an understanding of the instructions.   The patient was advised to call back or seek an in-person evaluation if the symptoms worsen or if the condition fails to improve as anticipated.  I provided 21 minutes of non-face-to-face time during this encounter. Including chart review, documentation     lHistory of Present Illness:    Patient stress related to upcoming knee replacment surgery, says surgeon has mentioned to keep observation for depression She is already on mood stabilizers, discsused compliance Will have a worker and friends daughter as support Discuss to call or schedule therapy and call our office earlier if any worsening of depression  Understands not to use THC and it can effect meds and mood Has had a challening last year with mom sickness, now  surgeries and MS Aggravating factor: surgeries . Multiple medical .surgeries, breakup in past, MS Modifying factor: dogs    Duration more then 10 years     Previous Psychotropic Medications: Yes   Substance Abuse History in the last 12 months:  Yes.   as per history Marijuana says it is medical for her condition.  Consequences of Substance Abuse: Medical Consequences:  fatigue, poor concenctration  Past Medical History:  Past Medical History:  Diagnosis Date   Anxiety    Bipolar 2 disorder, major depressive episode ([place])    Complication of anesthesia    COPD (chronic obstructive pulmonary disease) (HCC)    smoker   Fibromyalgia    Insomnia    Multiple sclerosis (HCC)    PONV (postoperative nausea and vomiting)    Pulmonary embolism ([place]) 2000   Sleep apnea    mild OSA no CPAP   Ulcerative colitis ([place])     Past Surgical History:  Procedure Laterality Date   FOOT SURGERY Left    bone spur   HEMORRHOID SURGERY     HUMERUS FRACTURE SURGERY Left    MYOMECTOMY     NASAL SINUS SURGERY     SHOULDER ARTHROSCOPY WITH SUBACROMIAL DECOMPRESSION AND BICEP TENDON REPAIR Left 07/27/2016   Procedure: SHOULDER ARTHROSCOPY DEBRIDEMENT ROTATOR CUFF AND LABRUM, SUBACROMIAL DECOMPRESSION, BICEPS TENODESIS;  Surgeon: Tania Ade, MD;  Location: [place];  Service: Orthopedics;  Laterality: Left;  SHOULDER ARTHROSCOPY DEBRIDEMENT ROTATOR CUFF AND LABRUM, SUBACROMIAL DECOMPRESSION, BICEPS TENOTOMY, POSSIBLE TENODESIS   THUMB ARTHROSCOPY  5 surgeries on left thumb, 4 surgeries on right   TONSILLECTOMY AND ADENOIDECTOMY       Family History:  Family History  Adopted: Yes  Problem Relation Age of Onset   Breast cancer Sister        Triple Negative    Social History:   Social History   Socioeconomic History   Marital status: Divorced    Spouse name: Not on file   Number of children: 1   Years of education: 16   Highest education level: Bachelor's degree  (e.g., BA, AB, BS)  Occupational History   Occupation: Legally disabled  Tobacco Use   Smoking status: Former    Packs/day: 0.25    Types: Cigarettes    Quit date: 07/08/2016    Years since quitting: 5.4   Smokeless tobacco: Never  Vaping Use   Vaping Use: Every day   Substances: Nicotine  Substance and Sexual Activity   Alcohol use: Yes    Alcohol/week: 0.0 standard drinks    Comment: rare    Drug use: No    Types: Marijuana    Comment: 09/05/21   Sexual activity: Yes    Partners: Male    Birth control/protection: None  Other Topics Concern   Not on file  Social History Narrative   Lives alone with her two dogs. She had a daughter who has passed away. Likes to work out and enjoys spending time with friends.   Social Determinants of Health   Financial Resource Strain: Medium Risk   Difficulty of Paying Living Expenses: Somewhat hard  Food Insecurity: Food Insecurity Present   Worried About [place] in the Last Year: Sometimes true   Ran Out of Food in the Last Year: Sometimes true  Transportation Needs: No Transportation Needs   Lack of Transportation (Medical): No   Lack of Transportation (Non-Medical): No  Physical Activity: Sufficiently Active   Days of Exercise per Week: 5 days   Minutes of Exercise per Session: 120 min  Stress: Stress Concern Present   Feeling of Stress : Rather much  Social Connections: Moderately Isolated   Frequency of Communication with Friends and Family: More than three times a week   Frequency of Social Gatherings with Friends and Family: More than three times a week   Attends Religious Services: 1 to 4 times per year   Active Member of [employer] or Organizations: No   Attends Archivist Meetings: Never   Marital Status: Divorced     Allergies:   Allergies  Allergen Reactions   Ketamine Anaphylaxis   Other Anaphylaxis, Hives and Swelling    curry curry   Oxycodone Diarrhea and Other (See Comments)   Papaya  Enzyme Anaphylaxis   Digestive Enzymes Swelling    Other reaction(s): Throat Swelling    Metabolic Disorder Labs: No results found for: HGBA1C, MPG No results found for: PROLACTIN No results found for: CHOL, TRIG, HDL, CHOLHDL, VLDL, LDLCALC   Current Medications: Current Outpatient Medications  Medication Sig Dispense Refill   albuterol (VENTOLIN HFA) 108 (90 Base) MCG/ACT inhaler INHALE 2 PUFFS INTO THE LUNGS EVERY 6 HOURS AS NEEDED FOR WHEEZING OR SHORTNESS OF BREATH 6.7 g 0   ALPRAZolam (XANAX) 0.5 MG tablet Take by mouth.     Biotin 10 MG CAPS Take by mouth daily.     calcium carbonate (OSCAL) 1500 (600 Ca) MG TABS tablet Take 600 mg by mouth in the morning and at bedtime.     Cholecalciferol  25 MCG (1000 UT) tablet Take by mouth.     clonazePAM (KLONOPIN) 0.5 MG tablet Take 1 tablet (0.5 mg total) by mouth in the morning and at bedtime. 180 tablet 1   diclofenac Sodium (VOLTAREN) 1 % GEL Apply 4 g topically 4 (four) times daily. To affected joint. 100 g 1   ferrous sulfate 325 (65 FE) MG tablet Take 1 tablet by mouth daily.     fluticasone (FLONASE) 50 MCG/ACT nasal spray Place 2 sprays into both nostrils daily. 16 g 3   folic acid (FOLVITE) 1 MG tablet Take 1 mg by mouth daily.     HYDROcodone-acetaminophen (NORCO/VICODIN) 5-325 MG tablet Take 1 tablet by mouth every 6 (six) hours as needed for moderate pain. 20 tablet 0   ibuprofen (ADVIL) 800 MG tablet Take 800 mg by mouth as needed.     lamoTRIgine (LAMICTAL) 200 MG tablet Take 1 tablet (200 mg total) by mouth 2 (two) times daily. Just a bridge rx until mail order comes 60 tablet 0   natalizumab (TYSABRI) 300 MG/15ML injection Inject into the vein.     QUEtiapine (SEROQUEL) 400 MG tablet Take 1 tablet (400 mg total) by mouth daily. 30 tablet 0   sulfaSALAzine (AZULFIDINE) 500 MG tablet Take by mouth.     varenicline (CHANTIX) 0.5 MG tablet Take 1 tablet (0.5 mg total) by mouth 2 (two) times daily. 60 tablet 0   varenicline  (CHANTIX) 1 MG tablet Take 1 tablet (1 mg total) by mouth 2 (two) times daily. 60 tablet 3   vitamin B-12 (CYANOCOBALAMIN) 1000 MCG tablet Take 1,000 mcg by mouth daily.     vitamin E 1000 UNIT capsule Take 1,000 Units by mouth daily.     No current facility-administered medications for this visit.      Psychiatric Specialty Exam: Review of Systems  Psychiatric/Behavioral:  Negative for suicidal ideas.    There were no vitals taken for this visit.There is no height or weight on file to calculate BMI.  General Appearance:   Eye Contact:   Speech:  Normal Rate  Volume:  normal  Mood: gets stressed  Affect:   Thought Process:  Goal Directed  Orientation:  Full (Time, Place, and Person)  Thought Content:  Logical  Suicidal Thoughts:  No  Homicidal Thoughts:  No  Memory:  Immediate;   Fair Recent;   Fair  Judgement:  Fair  Insight:  Fair  Psychomotor Activity:  Normal  Concentration:  Concentration: Fair and Attention Span: Fair  Recall:  [employer] of Knowledge:Fair  Language: Fair  Akathisia:  Negative  Handed:  Right  AIMS (if indicated):    Assets:  Desire for Improvement  ADL's:  Intact  Cognition: WNL  Sleep:  Fair while on meds    Treatment Plan Summary: Medication management and Plan as follows    Prior documentation reviewed Bipolar 2: depressed;gets subdued, discuss to schedule therapy, continue seroquel and preferably keep night time dosing after surgery to prevent depression. Continue lamictal Call early for any concerns has support   GAD: related to family, surgery, is on klonopine, understands THC can impair judjement Call for scheduling therapy in office or online services   Insomnia: fluctuates, seroquel helps consider therapy to deal with stressors  FU  2-3 weeks or earlier if needed  Collaboration of Care:   Patient/Guardian was advised Release of Information must be obtained prior to any record release in order to collaborate their care with  an outside provider.  Patient/Guardian was advised if they have not already done so to contact the registration department to sign all necessary forms in order for [place] to release information regarding their care.   Consent: Patient/Guardian gives verbal consent for treatment and assignment of benefits for services provided during this visit. Patient/Guardian expressed understanding and agreed to proceed.    Merian Capron, MD 2/17/20231:18 PM

## 2021-12-27 DIAGNOSIS — Z0100 Encounter for examination of eyes and vision without abnormal findings: Secondary | ICD-10-CM | POA: Diagnosis not present

## 2021-12-29 DIAGNOSIS — D649 Anemia, unspecified: Secondary | ICD-10-CM | POA: Diagnosis not present

## 2021-12-29 DIAGNOSIS — Z79899 Other long term (current) drug therapy: Secondary | ICD-10-CM | POA: Diagnosis not present

## 2021-12-29 DIAGNOSIS — M797 Fibromyalgia: Secondary | ICD-10-CM | POA: Diagnosis not present

## 2021-12-29 DIAGNOSIS — M25561 Pain in right knee: Secondary | ICD-10-CM | POA: Diagnosis not present

## 2021-12-29 DIAGNOSIS — K5190 Ulcerative colitis, unspecified, without complications: Secondary | ICD-10-CM | POA: Diagnosis not present

## 2021-12-29 DIAGNOSIS — Z9889 Other specified postprocedural states: Secondary | ICD-10-CM | POA: Diagnosis not present

## 2021-12-29 DIAGNOSIS — J449 Chronic obstructive pulmonary disease, unspecified: Secondary | ICD-10-CM | POA: Diagnosis not present

## 2021-12-29 DIAGNOSIS — Z634 Disappearance and death of family member: Secondary | ICD-10-CM | POA: Diagnosis not present

## 2021-12-29 DIAGNOSIS — G4733 Obstructive sleep apnea (adult) (pediatric): Secondary | ICD-10-CM | POA: Diagnosis not present

## 2021-12-29 DIAGNOSIS — Z96651 Presence of right artificial knee joint: Secondary | ICD-10-CM | POA: Diagnosis not present

## 2021-12-29 DIAGNOSIS — M1711 Unilateral primary osteoarthritis, right knee: Secondary | ICD-10-CM | POA: Diagnosis not present

## 2021-12-29 DIAGNOSIS — G8918 Other acute postprocedural pain: Secondary | ICD-10-CM | POA: Diagnosis not present

## 2021-12-29 DIAGNOSIS — Z86718 Personal history of other venous thrombosis and embolism: Secondary | ICD-10-CM | POA: Diagnosis not present

## 2021-12-29 DIAGNOSIS — G35 Multiple sclerosis: Secondary | ICD-10-CM | POA: Diagnosis not present

## 2021-12-29 DIAGNOSIS — F419 Anxiety disorder, unspecified: Secondary | ICD-10-CM | POA: Diagnosis not present

## 2021-12-29 DIAGNOSIS — F319 Bipolar disorder, unspecified: Secondary | ICD-10-CM | POA: Diagnosis not present

## 2021-12-30 DIAGNOSIS — D649 Anemia, unspecified: Secondary | ICD-10-CM | POA: Diagnosis not present

## 2021-12-30 DIAGNOSIS — F419 Anxiety disorder, unspecified: Secondary | ICD-10-CM | POA: Diagnosis not present

## 2021-12-30 DIAGNOSIS — G8918 Other acute postprocedural pain: Secondary | ICD-10-CM | POA: Diagnosis not present

## 2021-12-30 DIAGNOSIS — Z634 Disappearance and death of family member: Secondary | ICD-10-CM | POA: Diagnosis not present

## 2021-12-30 DIAGNOSIS — J449 Chronic obstructive pulmonary disease, unspecified: Secondary | ICD-10-CM | POA: Diagnosis not present

## 2021-12-30 DIAGNOSIS — F32A Depression, unspecified: Secondary | ICD-10-CM | POA: Diagnosis not present

## 2021-12-30 DIAGNOSIS — G8929 Other chronic pain: Secondary | ICD-10-CM | POA: Diagnosis not present

## 2021-12-30 DIAGNOSIS — K5190 Ulcerative colitis, unspecified, without complications: Secondary | ICD-10-CM | POA: Diagnosis not present

## 2021-12-30 DIAGNOSIS — M1711 Unilateral primary osteoarthritis, right knee: Secondary | ICD-10-CM | POA: Diagnosis not present

## 2021-12-30 DIAGNOSIS — Z86718 Personal history of other venous thrombosis and embolism: Secondary | ICD-10-CM | POA: Diagnosis not present

## 2021-12-30 DIAGNOSIS — G4733 Obstructive sleep apnea (adult) (pediatric): Secondary | ICD-10-CM | POA: Diagnosis not present

## 2021-12-30 DIAGNOSIS — M797 Fibromyalgia: Secondary | ICD-10-CM | POA: Diagnosis not present

## 2021-12-30 DIAGNOSIS — G35 Multiple sclerosis: Secondary | ICD-10-CM | POA: Diagnosis not present

## 2021-12-30 DIAGNOSIS — M25561 Pain in right knee: Secondary | ICD-10-CM | POA: Diagnosis not present

## 2021-12-30 DIAGNOSIS — F319 Bipolar disorder, unspecified: Secondary | ICD-10-CM | POA: Diagnosis not present

## 2021-12-31 DIAGNOSIS — M25661 Stiffness of right knee, not elsewhere classified: Secondary | ICD-10-CM | POA: Diagnosis not present

## 2021-12-31 DIAGNOSIS — R262 Difficulty in walking, not elsewhere classified: Secondary | ICD-10-CM | POA: Diagnosis not present

## 2021-12-31 DIAGNOSIS — M1711 Unilateral primary osteoarthritis, right knee: Secondary | ICD-10-CM | POA: Diagnosis not present

## 2021-12-31 DIAGNOSIS — R52 Pain, unspecified: Secondary | ICD-10-CM | POA: Diagnosis not present

## 2021-12-31 DIAGNOSIS — R29898 Other symptoms and signs involving the musculoskeletal system: Secondary | ICD-10-CM | POA: Diagnosis not present

## 2021-12-31 DIAGNOSIS — Z96651 Presence of right artificial knee joint: Secondary | ICD-10-CM | POA: Diagnosis not present

## 2022-01-05 DIAGNOSIS — M1711 Unilateral primary osteoarthritis, right knee: Secondary | ICD-10-CM | POA: Diagnosis not present

## 2022-01-05 DIAGNOSIS — M25661 Stiffness of right knee, not elsewhere classified: Secondary | ICD-10-CM | POA: Diagnosis not present

## 2022-01-05 DIAGNOSIS — R262 Difficulty in walking, not elsewhere classified: Secondary | ICD-10-CM | POA: Diagnosis not present

## 2022-01-05 DIAGNOSIS — R29898 Other symptoms and signs involving the musculoskeletal system: Secondary | ICD-10-CM | POA: Diagnosis not present

## 2022-01-05 DIAGNOSIS — R52 Pain, unspecified: Secondary | ICD-10-CM | POA: Diagnosis not present

## 2022-01-05 DIAGNOSIS — Z96651 Presence of right artificial knee joint: Secondary | ICD-10-CM | POA: Diagnosis not present

## 2022-01-12 DIAGNOSIS — R52 Pain, unspecified: Secondary | ICD-10-CM | POA: Diagnosis not present

## 2022-01-12 DIAGNOSIS — M1711 Unilateral primary osteoarthritis, right knee: Secondary | ICD-10-CM | POA: Diagnosis not present

## 2022-01-12 DIAGNOSIS — R29898 Other symptoms and signs involving the musculoskeletal system: Secondary | ICD-10-CM | POA: Diagnosis not present

## 2022-01-12 DIAGNOSIS — Z96651 Presence of right artificial knee joint: Secondary | ICD-10-CM | POA: Diagnosis not present

## 2022-01-12 DIAGNOSIS — R262 Difficulty in walking, not elsewhere classified: Secondary | ICD-10-CM | POA: Diagnosis not present

## 2022-01-12 DIAGNOSIS — M25661 Stiffness of right knee, not elsewhere classified: Secondary | ICD-10-CM | POA: Diagnosis not present

## 2022-01-13 ENCOUNTER — Telehealth: Payer: Self-pay | Admitting: Physician Assistant

## 2022-01-13 NOTE — Telephone Encounter (Signed)
She should have enough until April by her chart rx? Can we find out what is going on?

## 2022-01-13 NOTE — Telephone Encounter (Signed)
Patient called upset about not having anymore Klonopin left. She just had surgery and has not had any sleep.  ?

## 2022-01-13 NOTE — Telephone Encounter (Signed)
Called patient and made her aware written in October for 90 days with a refill. She will contact the pharmacy. ?

## 2022-01-14 DIAGNOSIS — Z7969 Long term (current) use of other immunomodulators and immunosuppressants: Secondary | ICD-10-CM | POA: Diagnosis not present

## 2022-01-14 DIAGNOSIS — G35 Multiple sclerosis: Secondary | ICD-10-CM | POA: Diagnosis not present

## 2022-01-15 DIAGNOSIS — Z96651 Presence of right artificial knee joint: Secondary | ICD-10-CM | POA: Diagnosis not present

## 2022-01-15 DIAGNOSIS — Z471 Aftercare following joint replacement surgery: Secondary | ICD-10-CM | POA: Diagnosis not present

## 2022-01-16 DIAGNOSIS — Z96651 Presence of right artificial knee joint: Secondary | ICD-10-CM | POA: Diagnosis not present

## 2022-01-16 DIAGNOSIS — M1711 Unilateral primary osteoarthritis, right knee: Secondary | ICD-10-CM | POA: Diagnosis not present

## 2022-01-16 DIAGNOSIS — R52 Pain, unspecified: Secondary | ICD-10-CM | POA: Diagnosis not present

## 2022-01-16 DIAGNOSIS — R262 Difficulty in walking, not elsewhere classified: Secondary | ICD-10-CM | POA: Diagnosis not present

## 2022-01-16 DIAGNOSIS — M25661 Stiffness of right knee, not elsewhere classified: Secondary | ICD-10-CM | POA: Diagnosis not present

## 2022-01-16 DIAGNOSIS — R29898 Other symptoms and signs involving the musculoskeletal system: Secondary | ICD-10-CM | POA: Diagnosis not present

## 2022-01-21 DIAGNOSIS — R29898 Other symptoms and signs involving the musculoskeletal system: Secondary | ICD-10-CM | POA: Diagnosis not present

## 2022-01-21 DIAGNOSIS — R52 Pain, unspecified: Secondary | ICD-10-CM | POA: Diagnosis not present

## 2022-01-21 DIAGNOSIS — M1711 Unilateral primary osteoarthritis, right knee: Secondary | ICD-10-CM | POA: Diagnosis not present

## 2022-01-21 DIAGNOSIS — Z96651 Presence of right artificial knee joint: Secondary | ICD-10-CM | POA: Diagnosis not present

## 2022-01-21 DIAGNOSIS — R262 Difficulty in walking, not elsewhere classified: Secondary | ICD-10-CM | POA: Diagnosis not present

## 2022-01-21 DIAGNOSIS — M25661 Stiffness of right knee, not elsewhere classified: Secondary | ICD-10-CM | POA: Diagnosis not present

## 2022-01-22 ENCOUNTER — Other Ambulatory Visit: Payer: Self-pay | Admitting: Physician Assistant

## 2022-01-23 DIAGNOSIS — R262 Difficulty in walking, not elsewhere classified: Secondary | ICD-10-CM | POA: Diagnosis not present

## 2022-01-23 DIAGNOSIS — R52 Pain, unspecified: Secondary | ICD-10-CM | POA: Diagnosis not present

## 2022-01-23 DIAGNOSIS — M1711 Unilateral primary osteoarthritis, right knee: Secondary | ICD-10-CM | POA: Diagnosis not present

## 2022-01-23 DIAGNOSIS — R29898 Other symptoms and signs involving the musculoskeletal system: Secondary | ICD-10-CM | POA: Diagnosis not present

## 2022-01-23 DIAGNOSIS — Z96651 Presence of right artificial knee joint: Secondary | ICD-10-CM | POA: Diagnosis not present

## 2022-01-23 DIAGNOSIS — M25661 Stiffness of right knee, not elsewhere classified: Secondary | ICD-10-CM | POA: Diagnosis not present

## 2022-01-28 DIAGNOSIS — M25661 Stiffness of right knee, not elsewhere classified: Secondary | ICD-10-CM | POA: Diagnosis not present

## 2022-01-28 DIAGNOSIS — R29898 Other symptoms and signs involving the musculoskeletal system: Secondary | ICD-10-CM | POA: Diagnosis not present

## 2022-01-28 DIAGNOSIS — M1711 Unilateral primary osteoarthritis, right knee: Secondary | ICD-10-CM | POA: Diagnosis not present

## 2022-01-28 DIAGNOSIS — R52 Pain, unspecified: Secondary | ICD-10-CM | POA: Diagnosis not present

## 2022-01-28 DIAGNOSIS — Z96651 Presence of right artificial knee joint: Secondary | ICD-10-CM | POA: Diagnosis not present

## 2022-01-28 DIAGNOSIS — R262 Difficulty in walking, not elsewhere classified: Secondary | ICD-10-CM | POA: Diagnosis not present

## 2022-01-30 DIAGNOSIS — R52 Pain, unspecified: Secondary | ICD-10-CM | POA: Diagnosis not present

## 2022-01-30 DIAGNOSIS — M25661 Stiffness of right knee, not elsewhere classified: Secondary | ICD-10-CM | POA: Diagnosis not present

## 2022-01-30 DIAGNOSIS — R29898 Other symptoms and signs involving the musculoskeletal system: Secondary | ICD-10-CM | POA: Diagnosis not present

## 2022-01-30 DIAGNOSIS — Z96651 Presence of right artificial knee joint: Secondary | ICD-10-CM | POA: Diagnosis not present

## 2022-01-30 DIAGNOSIS — M1711 Unilateral primary osteoarthritis, right knee: Secondary | ICD-10-CM | POA: Diagnosis not present

## 2022-01-30 DIAGNOSIS — R262 Difficulty in walking, not elsewhere classified: Secondary | ICD-10-CM | POA: Diagnosis not present

## 2022-02-02 DIAGNOSIS — R262 Difficulty in walking, not elsewhere classified: Secondary | ICD-10-CM | POA: Diagnosis not present

## 2022-02-02 DIAGNOSIS — R29898 Other symptoms and signs involving the musculoskeletal system: Secondary | ICD-10-CM | POA: Diagnosis not present

## 2022-02-02 DIAGNOSIS — M25661 Stiffness of right knee, not elsewhere classified: Secondary | ICD-10-CM | POA: Diagnosis not present

## 2022-02-02 DIAGNOSIS — R52 Pain, unspecified: Secondary | ICD-10-CM | POA: Diagnosis not present

## 2022-02-02 DIAGNOSIS — M1711 Unilateral primary osteoarthritis, right knee: Secondary | ICD-10-CM | POA: Diagnosis not present

## 2022-02-02 DIAGNOSIS — Z96651 Presence of right artificial knee joint: Secondary | ICD-10-CM | POA: Diagnosis not present

## 2022-02-05 DIAGNOSIS — Z96651 Presence of right artificial knee joint: Secondary | ICD-10-CM | POA: Diagnosis not present

## 2022-02-05 DIAGNOSIS — R29898 Other symptoms and signs involving the musculoskeletal system: Secondary | ICD-10-CM | POA: Diagnosis not present

## 2022-02-05 DIAGNOSIS — M25661 Stiffness of right knee, not elsewhere classified: Secondary | ICD-10-CM | POA: Diagnosis not present

## 2022-02-05 DIAGNOSIS — M1711 Unilateral primary osteoarthritis, right knee: Secondary | ICD-10-CM | POA: Diagnosis not present

## 2022-02-05 DIAGNOSIS — R52 Pain, unspecified: Secondary | ICD-10-CM | POA: Diagnosis not present

## 2022-02-05 DIAGNOSIS — R262 Difficulty in walking, not elsewhere classified: Secondary | ICD-10-CM | POA: Diagnosis not present

## 2022-02-09 DIAGNOSIS — R29898 Other symptoms and signs involving the musculoskeletal system: Secondary | ICD-10-CM | POA: Diagnosis not present

## 2022-02-09 DIAGNOSIS — M25661 Stiffness of right knee, not elsewhere classified: Secondary | ICD-10-CM | POA: Diagnosis not present

## 2022-02-09 DIAGNOSIS — R262 Difficulty in walking, not elsewhere classified: Secondary | ICD-10-CM | POA: Diagnosis not present

## 2022-02-09 DIAGNOSIS — Z96651 Presence of right artificial knee joint: Secondary | ICD-10-CM | POA: Diagnosis not present

## 2022-02-09 DIAGNOSIS — M1711 Unilateral primary osteoarthritis, right knee: Secondary | ICD-10-CM | POA: Diagnosis not present

## 2022-02-09 DIAGNOSIS — R52 Pain, unspecified: Secondary | ICD-10-CM | POA: Diagnosis not present

## 2022-02-11 DIAGNOSIS — M1711 Unilateral primary osteoarthritis, right knee: Secondary | ICD-10-CM | POA: Diagnosis not present

## 2022-02-11 DIAGNOSIS — R262 Difficulty in walking, not elsewhere classified: Secondary | ICD-10-CM | POA: Diagnosis not present

## 2022-02-11 DIAGNOSIS — M25661 Stiffness of right knee, not elsewhere classified: Secondary | ICD-10-CM | POA: Diagnosis not present

## 2022-02-11 DIAGNOSIS — Z96651 Presence of right artificial knee joint: Secondary | ICD-10-CM | POA: Diagnosis not present

## 2022-02-11 DIAGNOSIS — R52 Pain, unspecified: Secondary | ICD-10-CM | POA: Diagnosis not present

## 2022-02-11 DIAGNOSIS — R29898 Other symptoms and signs involving the musculoskeletal system: Secondary | ICD-10-CM | POA: Diagnosis not present

## 2022-02-17 DIAGNOSIS — R262 Difficulty in walking, not elsewhere classified: Secondary | ICD-10-CM | POA: Diagnosis not present

## 2022-02-17 DIAGNOSIS — M1711 Unilateral primary osteoarthritis, right knee: Secondary | ICD-10-CM | POA: Diagnosis not present

## 2022-02-17 DIAGNOSIS — Z96651 Presence of right artificial knee joint: Secondary | ICD-10-CM | POA: Diagnosis not present

## 2022-02-17 DIAGNOSIS — M25661 Stiffness of right knee, not elsewhere classified: Secondary | ICD-10-CM | POA: Diagnosis not present

## 2022-02-17 DIAGNOSIS — R29898 Other symptoms and signs involving the musculoskeletal system: Secondary | ICD-10-CM | POA: Diagnosis not present

## 2022-02-17 DIAGNOSIS — R52 Pain, unspecified: Secondary | ICD-10-CM | POA: Diagnosis not present

## 2022-02-18 DIAGNOSIS — Z471 Aftercare following joint replacement surgery: Secondary | ICD-10-CM | POA: Diagnosis not present

## 2022-02-18 DIAGNOSIS — Z96651 Presence of right artificial knee joint: Secondary | ICD-10-CM | POA: Diagnosis not present

## 2022-02-20 DIAGNOSIS — R262 Difficulty in walking, not elsewhere classified: Secondary | ICD-10-CM | POA: Diagnosis not present

## 2022-02-20 DIAGNOSIS — R29898 Other symptoms and signs involving the musculoskeletal system: Secondary | ICD-10-CM | POA: Diagnosis not present

## 2022-02-20 DIAGNOSIS — R52 Pain, unspecified: Secondary | ICD-10-CM | POA: Diagnosis not present

## 2022-02-20 DIAGNOSIS — M25661 Stiffness of right knee, not elsewhere classified: Secondary | ICD-10-CM | POA: Diagnosis not present

## 2022-02-20 DIAGNOSIS — Z96651 Presence of right artificial knee joint: Secondary | ICD-10-CM | POA: Diagnosis not present

## 2022-02-20 DIAGNOSIS — M1711 Unilateral primary osteoarthritis, right knee: Secondary | ICD-10-CM | POA: Diagnosis not present

## 2022-02-26 ENCOUNTER — Telehealth: Payer: Self-pay | Admitting: Physician Assistant

## 2022-02-26 DIAGNOSIS — G35 Multiple sclerosis: Secondary | ICD-10-CM | POA: Diagnosis not present

## 2022-02-26 NOTE — Telephone Encounter (Signed)
Paige Lozano fell and injured her left wrist. She has an appointment tomorrow with Luvenia Starch. She is requesting a xray of her left wrist today. Please advise.  ? ?She is in town for 2 days.  ?

## 2022-02-26 NOTE — Telephone Encounter (Signed)
Patient called demanding a visit today or an xray to rule out if she sprained or broke her ankle. She has an appointment scheduled for tomorrow.  ?

## 2022-02-26 NOTE — Telephone Encounter (Signed)
I spoke with Dr Madilyn Fireman and she would prefer [place] be evaluated before we order x-rays.  ? ?Patient advised.  ?

## 2022-02-27 ENCOUNTER — Encounter: Payer: Self-pay | Admitting: Physician Assistant

## 2022-02-27 ENCOUNTER — Ambulatory Visit (INDEPENDENT_AMBULATORY_CARE_PROVIDER_SITE_OTHER): Payer: Medicare HMO | Admitting: Physician Assistant

## 2022-02-27 ENCOUNTER — Ambulatory Visit (INDEPENDENT_AMBULATORY_CARE_PROVIDER_SITE_OTHER): Payer: Medicare HMO

## 2022-02-27 VITALS — BP 134/77 | HR 89 | Ht 68.5 in | Wt 155.0 lb

## 2022-02-27 DIAGNOSIS — W08XXXA Fall from other furniture, initial encounter: Secondary | ICD-10-CM

## 2022-02-27 DIAGNOSIS — D171 Benign lipomatous neoplasm of skin and subcutaneous tissue of trunk: Secondary | ICD-10-CM | POA: Diagnosis not present

## 2022-02-27 DIAGNOSIS — F17299 Nicotine dependence, other tobacco product, with unspecified nicotine-induced disorders: Secondary | ICD-10-CM

## 2022-02-27 DIAGNOSIS — Z716 Tobacco abuse counseling: Secondary | ICD-10-CM | POA: Diagnosis not present

## 2022-02-27 DIAGNOSIS — M79642 Pain in left hand: Secondary | ICD-10-CM | POA: Diagnosis not present

## 2022-02-27 DIAGNOSIS — S6992XA Unspecified injury of left wrist, hand and finger(s), initial encounter: Secondary | ICD-10-CM

## 2022-02-27 MED ORDER — VARENICLINE TARTRATE 1 MG PO TABS: 1.0000 mg | ORAL_TABLET | Freq: Two times a day (BID) | ORAL | 3 refills | Status: DC

## 2022-02-27 MED ORDER — METHOCARBAMOL 500 MG PO TABS: 500.0000 mg | ORAL_TABLET | Freq: Three times a day (TID) | ORAL | 0 refills | Status: DC

## 2022-02-27 MED ORDER — GABAPENTIN 300 MG PO CAPS: 300.0000 mg | ORAL_CAPSULE | Freq: Three times a day (TID) | ORAL | 0 refills | Status: DC

## 2022-02-27 MED ORDER — HYDROCODONE-ACETAMINOPHEN 5-325 MG PO TABS: 1.0000 | ORAL_TABLET | Freq: Four times a day (QID) | ORAL | 0 refills | Status: DC | PRN

## 2022-02-27 NOTE — Patient Instructions (Addendum)
Wrist Sprain, Adult ?A wrist sprain is a stretch or tear in the strong tissues that connect the wrist bones to each other. These strong tissues are called ligaments. There are three types of wrist sprains: ?Grade 1. The ligament is stretched more than normal. There may be a minor amount of wrist pain. ?Grade 2. The ligament is partially torn. You may be able to move your wrist, but not very much. There may be a moderate amount of wrist pain. ?Grade 3. The ligament or ligaments are completely torn. You may find it difficult to move your wrist even a little. There may be a significant amount of wrist pain. ?What are the causes? ?This condition may be caused by using the wrist too much during sports, exercise, or work. It can also happen due to a fall or during an accident. ?What increases the risk? ?You are more likely to develop this condition if: ?You had a previous wrist or arm injury. ?You have poor wrist strength and flexibility. ?You play contact sports, such as football or soccer. ?You participate in sports that may result in a fall, such as skateboarding, biking, skiing, or snowboarding. ?You do not exercise regularly. ?You use exercise equipment that does not fit well. ?What are the signs or symptoms? ?Symptoms of this condition include: ?Pain in the wrist, arm, or hand. ?Swelling or bruised skin near the wrist, hand, or arm. The skin may look yellow or blue. ?Stiffness or trouble moving the hand. ?Hearing a noise, like a pop or a snap, at the time of injury, or feeling a tear at the time of the injury. ?A warm feeling in the skin around the wrist. ?How is this diagnosed? ?This condition is diagnosed with a physical exam. Sometimes an X-ray is taken to make sure a bone did not break. You may also have an MRI of your wrist to check for torn ligaments. ?How is this treated? ?This condition is treated by resting and applying ice to your wrist. Additional treatment may include: ?Taking medicine for pain and  inflammation. ?Wearing a splint, brace, or cast for a short period of time to keep your wrist from moving (immobilized). ?Doing exercises to strengthen and stretch your wrist. ?Having surgery. This may be done if the ligament is completely torn. ?Follow these instructions at home: ?If you have a splint or brace: ?Wear the splint or brace as told by your health care provider. Remove it only as told by your health care provider. ?Loosen it if your fingers tingle, become numb, or turn cold and blue. ?Keep it clean. ?If the splint or brace is not waterproof: ?Do not let it get wet. ?Cover it with a watertight covering when you take a bath or a shower. ?If you have a cast: ?Do not put pressure on any part of the cast until it is fully hardened. This may take several hours. ?Do not stick anything inside the cast to scratch your skin. Doing that increases your risk of infection. ?Check the skin around the cast every day. Tell your health care provider about any concerns. ?You may put lotion on dry skin around the edges of the cast. Do not put lotion on the skin underneath the cast. ?Keep it clean. ?If the cast is not waterproof: ?Do not let it get wet. ?Cover it with a watertight covering when you take a bath or shower. ?Managing pain, stiffness, and swelling ? ?If directed, put ice on the injured area. To do this: ?If you have a  removable splint or brace, remove it as told by your health care provider. ?Put ice in a plastic bag. ?Place a towel between your skin and the bag or between the splint or cast and the bag. ?Leave the ice on for 20 minutes, 2-3 times a day. ?Remove the ice if your skin turns bright red. This is very important. If you cannot feel pain, heat, or cold, you have a greater risk of damage to the area. ?Move your fingers often to reduce stiffness and swelling. ?Raise (elevate) the injured area above the level of your heart while you are sitting or lying down. ?Activity ?Rest your wrist as told by your  health care provider. Do not do things that cause pain. ?Ask your health care provider when it is safe to drive if you have a splint, brace, or cast on your wrist. ?Do exercises as told by your health care provider. ?Return to your normal activities as told by your health care provider. Ask your health care provider what activities are safe for you. ?General instructions ?Take over-the-counter and prescription medicines only as told by your health care provider. ?Do not use any products that contain nicotine or tobacco, such as cigarettes, e-cigarettes, and chewing tobacco. These can delay healing. If you need help quitting, ask your health care provider. ?Keep all follow-up visits. This is important. ?Contact a health care provider if: ?Your pain, bruising, or swelling gets worse. ?Your skin becomes red, gets a rash, or has open sores. ?Your pain does not get better or it gets worse. ?Get help right away if: ?You have a new or sudden sharp pain in the hand, arm, or wrist. ?You have tingling or numbness in your hand. ?Your fingers turn white, very red, or cold and blue. ?You cannot move your fingers. ?Summary ?A wrist sprain is damage to ligaments in your wrist. ?Wrist sprains can range from mild to severe. ?Return to your normal activities as told by your health care provider. Ask your health care provider what activities are safe for you. ?You may need to wear a splint, brace, or cast for a short period of time. ?This information is not intended to replace advice given to you by your health care provider. Make sure you discuss any questions you have with your health care provider. ?Document Revised: 03/04/2020 Document Reviewed: 03/04/2020 ?Elsevier Patient Education ? [place]. ? ?Lipoma ? ?A lipoma is a noncancerous (benign) tumor that is made up of fat cells. This is a very common type of soft-tissue growth. Lipomas are usually found under the skin (subcutaneous). They may occur in any tissue of the  body that contains fat. Common areas for lipomas to appear include the back, arms, shoulders, buttocks, and thighs. ?Lipomas grow slowly, and they are usually painless. Most lipomas do not cause problems and do not require treatment. ?What are the causes? ?The cause of this condition is not known. ?What increases the risk? ?You are more likely to develop this condition if: ?You are 3-15 years old. ?You have a family history of lipomas. ?What are the signs or symptoms? ?A lipoma usually appears as a small, round bump under the skin. In most cases, the lump will: ?Feel soft or rubbery. ?Not cause pain or other symptoms. ?However, if a lipoma is located in an area where it pushes on nerves, it can become painful or cause other symptoms. ?How is this diagnosed? ?A lipoma can usually be diagnosed with a physical exam. You may also have tests  to confirm the diagnosis and to rule out other conditions. Tests may include: ?Imaging tests, such as a CT scan or an MRI. ?Removal of a tissue sample to be looked at under a microscope (biopsy). ?How is this treated? ?Treatment for this condition depends on the size of the lipoma and whether it is causing any symptoms. ?For small lipomas that are not causing problems, no treatment is needed. ?If a lipoma is bigger or it causes problems, surgery may be done to remove the lipoma. Lipomas can also be removed to improve appearance. Most often, the procedure is done after applying a medicine that numbs the area (local anesthetic). ?Liposuction may be done to reduce the size of the lipoma before it is removed through surgery, or it may be done to remove the lipoma. Lipomas are removed with this method to limit incision size and scarring. A liposuction tube is inserted through a small incision into the lipoma, and the contents of the lipoma are removed through the tube with suction. ?Follow these instructions at home: ?Watch your lipoma for any changes. ?Keep all follow-up visits. This is  important. ?Where to find more information ?OrthoInfo: orthoinfo.aaos.org ?Contact a health care provider if: ?Your lipoma becomes larger or hard. ?Your lipoma becomes painful, red, or increasingly swollen. T

## 2022-02-27 NOTE — Progress Notes (Signed)
? ?Acute Office Visit ? ?Subjective:  ? ?  ?Patient ID: Paige Lozano, female    DOB: 06-Jan-1966, 56 y.o.   MRN: [id number] ? ?Chief Complaint  ?Patient presents with  ? Wrist Pain  ? ? ?HPI ?Patient is in today to discussed mass of right flank and left wrist injury.  ? ?Noticed since she has lost weight a mobile mass of right flank come up. She just wanted to make sure no further work up should be done. No fever, chills, body aches. She is trying to lose weight.  ? ?1 week ago she was on a step stool and fell off and landed on her left wrist more radial side. She had some initial swelling and a lot of pain. Pain is still present but function is limited now due to pain. She had a few norco she has been using and ibuprofen.  ? ?.. ?Active Ambulatory Problems  ?  Diagnosis Date Noted  ? Multiple sclerosis ([place]) 05/29/2016  ? Fibromyalgia 05/29/2016  ? Osteoarthritis of thumb 05/29/2016  ? Insomnia 05/29/2016  ? 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 Bipolar 2 disorder ([place]) 05/29/2016  ? Nicotine dependence 05/29/2016  ? Primary osteoarthritis of right knee with recent ACL tear and femoral condylar impaction fracture 06/01/2016  ? Anxiety state 06/01/2016  ? Left shoulder pain 06/01/2016  ? GAD (generalized anxiety disorder) 07/20/2016  ? History of pulmonary embolus (PE) 07/21/2016  ? Right elbow pain 07/21/2016  ? Hot flashes 12/29/2016  ? Body aches 12/29/2016  ? Pruritus 12/29/2016  ? Palpitations 03/08/2017  ? Chest tightness 03/08/2017  ? Tachycardia 03/08/2017  ? Sinus tachycardia 03/12/2017  ? PAC (premature atrial contraction) 03/12/2017  ? Lumbar degenerative disc disease 06/02/2017  ? Plantar fasciitis, bilateral 10/29/2017  ? Onychomycosis 02/17/2018  ? Neurodermatitis 03/29/2018  ? Subcutaneous mass 04/07/2018  ? Family history of breast cancer 12/02/2018  ? Acute stress reaction 03/13/2019  ? Fracture of second and third metatarsals of the right foot 07/14/2019  ? Left wrist  injury 07/31/2019  ? Thyroid disorder screen 09/04/2019  ? Actinic keratoses 09/04/2019  ? Abrasion 11/22/2019  ? History of lipoma 11/22/2019  ? Air hunger 11/28/2020  ? SOB (shortness of breath) 11/28/2020  ? Right bundle branch block 11/28/2020  ? Ulcerative colitis ([place])   ? Sleep apnea   ? PONV (postoperative nausea and vomiting)   ? Pulmonary embolism ([place]) 2000  ? COPD (chronic obstructive pulmonary disease) ([place])   ? Complication of anesthesia   ? Bipolar 2 disorder, major depressive episode ([place])   ? Anxiety   ? Cervical radiculopathy 12/13/2020  ? Inflamed sebaceous cyst 02/11/2021  ? Raynaud's phenomenon without gangrene 08/29/2021  ? Stress at home 08/29/2021  ? Pain of left middle finger 11/28/2021  ? Stenosing tenosynovitis of finger of right hand 11/28/2021  ? Lipoma of torso 03/02/2022  ? ?Resolved Ambulatory Problems  ?  Diagnosis Date Noted  ? Right lateral epicondylitis 07/21/2016  ? Sinusitis 11/30/2016  ? Right wrist injury 12/29/2016  ? Hand injury, left, subsequent encounter 12/30/2016  ? Chills (without fever) 03/08/2017  ? ?No Additional Past Medical History  ? ? ? ? ?ROS ? ?See HPI.  ?   ?Objective:  ?  ?BP 134/77   Pulse 89   Ht 5' 8.5" (1.74 m)   Wt 155 lb (70.3 kg)   SpO2 99%   BMI 23.22 kg/m?  ?BP Readings from Last 3 Encounters:  ?02/27/22 134/77  ?11/25/21 121/68  ?  12/10/20 100/70  ? ?  ? ?Physical Exam ?Vitals reviewed.  ?Constitutional:   ?   Appearance: Normal appearance.  ?HENT:  ?   Head: Normocephalic.  ?Cardiovascular:  ?   Rate and Rhythm: Normal rate and regular rhythm.  ?Pulmonary:  ?   Effort: Pulmonary effort is normal.  ?   Breath sounds: Normal breath sounds.  ?Musculoskeletal:  ?   Comments: Mobile non tender mass of the superficial skin of right flank just over her ribs. ? ?Left wrist no swelling ?Tenderness over the radial side of wrist to palpation ?Decreased hand grip 2/5. ?Decreased ROM due to pain  ?Neurological:  ?   General: No focal deficit present.  ?    Mental Status: She is alert and oriented to person, place, and time.  ?Psychiatric:     ?   Mood and Affect: Mood normal.  ? ? ? ? ?   ?Assessment & Plan:  ?..Lacresha was seen today for wrist pain. ? ?Diagnoses and all orders for this visit: ? ?Injury of left wrist, initial encounter ?-     DG Wrist Complete Left; Future ?-     gabapentin (NEURONTIN) 300 MG capsule; Take 1 capsule (300 mg total) by mouth 3 (three) times daily. ?-     methocarbamol (ROBAXIN) 500 MG tablet; Take 1 tablet (500 mg total) by mouth 3 (three) times daily. ?-     HYDROcodone-acetaminophen (NORCO/VICODIN) 5-325 MG tablet; Take 1 tablet by mouth every 6 (six) hours as needed for moderate pain. ? ?Encounter for smoking cessation counseling ?-     varenicline (CHANTIX) 1 MG tablet; Take 1 tablet (1 mg total) by mouth 2 (two) times daily. ? ?Other tobacco product nicotine dependence with nicotine-induced disorder ?-     varenicline (CHANTIX) 1 MG tablet; Take 1 tablet (1 mg total) by mouth 2 (two) times daily. ? ?Lipoma of torso ? ? ?Xray showed no fracture ?Placed in wrist splint ?Ice as needed ?Ibuprofen as needed ?Norco only for break through pain small quantity given ?[place] Kitchen.PDMP reviewed during this encounter. ?Hand exercises discussed for strength ?Follow up in 2 weeks ? ?Discussed lipoma of the right flank ?If enlarging or causing problems follow up sooner ? ?Return if symptoms worsen or fail to improve. ? ?Iran Planas, PA-C ? ? ?

## 2022-03-02 ENCOUNTER — Encounter: Payer: Self-pay | Admitting: Physician Assistant

## 2022-03-02 DIAGNOSIS — D171 Benign lipomatous neoplasm of skin and subcutaneous tissue of trunk: Secondary | ICD-10-CM | POA: Insufficient documentation

## 2022-03-13 ENCOUNTER — Telehealth: Payer: Self-pay | Admitting: Neurology

## 2022-03-13 DIAGNOSIS — M25532 Pain in left wrist: Secondary | ICD-10-CM

## 2022-03-13 DIAGNOSIS — S6992XA Unspecified injury of left wrist, hand and finger(s), initial encounter: Secondary | ICD-10-CM

## 2022-03-13 DIAGNOSIS — F411 Generalized anxiety disorder: Secondary | ICD-10-CM

## 2022-03-13 NOTE — Telephone Encounter (Signed)
Patient left vm and states after two weeks in a brace her wrist pain is worse. She is back in town and wants to get a CT of her wrist. Did you want to order?  ?

## 2022-03-16 MED ORDER — CLONAZEPAM 0.5 MG PO TABS: 0.5000 mg | ORAL_TABLET | Freq: Two times a day (BID) | ORAL | 1 refills | Status: DC

## 2022-03-16 NOTE — Telephone Encounter (Signed)
Paige Lozano called and left a rude message asking if she could have a CT of her wrist. She is also wanting a refill on Clonazepam.  ? ?Pended prescription.  ?

## 2022-03-16 NOTE — Telephone Encounter (Signed)
Patient also called and left a very rude message on my line as well. Wants Clonazepam. Last written 08/29/2021 #180 with 1 refill.  ? ?Wants an answer about the CT of her wrist, and was very mad she had not gotten an answer to that question yet.  ?

## 2022-03-16 NOTE — Telephone Encounter (Signed)
Patient made aware. Send to [facility]. Pended. ?

## 2022-03-16 NOTE — Telephone Encounter (Signed)
Ordered MRI 

## 2022-03-19 ENCOUNTER — Telehealth (HOSPITAL_COMMUNITY): Payer: Self-pay | Admitting: Psychiatry

## 2022-03-19 MED ORDER — LAMOTRIGINE 200 MG PO TABS: 200.0000 mg | ORAL_TABLET | Freq: Two times a day (BID) | ORAL | 0 refills | Status: DC

## 2022-03-19 NOTE — Telephone Encounter (Signed)
Pt calling ?Needs refill on lamictal  ? ?She needs a 90day supply sent to [facility]  ?However she is out now and needs a 10day supply sent to [facility] ? ?

## 2022-03-21 ENCOUNTER — Ambulatory Visit (INDEPENDENT_AMBULATORY_CARE_PROVIDER_SITE_OTHER): Payer: Medicare HMO

## 2022-03-21 DIAGNOSIS — S52592A Other fractures of lower end of left radius, initial encounter for closed fracture: Secondary | ICD-10-CM | POA: Diagnosis not present

## 2022-03-21 DIAGNOSIS — S6992XA Unspecified injury of left wrist, hand and finger(s), initial encounter: Secondary | ICD-10-CM

## 2022-03-21 DIAGNOSIS — M25432 Effusion, left wrist: Secondary | ICD-10-CM | POA: Diagnosis not present

## 2022-03-21 DIAGNOSIS — Z9889 Other specified postprocedural states: Secondary | ICD-10-CM | POA: Diagnosis not present

## 2022-03-21 DIAGNOSIS — S63592A Other specified sprain of left wrist, initial encounter: Secondary | ICD-10-CM | POA: Diagnosis not present

## 2022-03-21 DIAGNOSIS — M25532 Pain in left wrist: Secondary | ICD-10-CM | POA: Diagnosis not present

## 2022-03-24 ENCOUNTER — Other Ambulatory Visit: Payer: Self-pay | Admitting: Physician Assistant

## 2022-03-24 DIAGNOSIS — S6992XA Unspecified injury of left wrist, hand and finger(s), initial encounter: Secondary | ICD-10-CM | POA: Insufficient documentation

## 2022-03-24 DIAGNOSIS — S6992XD Unspecified injury of left wrist, hand and finger(s), subsequent encounter: Secondary | ICD-10-CM

## 2022-03-24 DIAGNOSIS — M25532 Pain in left wrist: Secondary | ICD-10-CM | POA: Insufficient documentation

## 2022-03-24 DIAGNOSIS — S52529D Torus fracture of lower end of unspecified radius, subsequent encounter for fracture with routine healing: Secondary | ICD-10-CM | POA: Insufficient documentation

## 2022-03-24 DIAGNOSIS — S63592A Other specified sprain of left wrist, initial encounter: Secondary | ICD-10-CM

## 2022-03-24 DIAGNOSIS — S52522D Torus fracture of lower end of left radius, subsequent encounter for fracture with routine healing: Secondary | ICD-10-CM

## 2022-03-24 NOTE — Progress Notes (Signed)
You have complex ligament tear of TFCC, metaphyseal fracture of radius and lots on swelling in the bone. Urgent referral made for hand surgery Dr. Amedeo Plenty.

## 2022-03-27 ENCOUNTER — Telehealth: Payer: Self-pay

## 2022-03-27 ENCOUNTER — Other Ambulatory Visit: Payer: Self-pay | Admitting: Neurology

## 2022-03-27 DIAGNOSIS — S6992XA Unspecified injury of left wrist, hand and finger(s), initial encounter: Secondary | ICD-10-CM

## 2022-03-27 NOTE — Telephone Encounter (Signed)
Patient called requesting refill of hydrocodone.  Given RX 02/27/2022 #20 with no refills She has appt with ortho on Monday Please advise on refill. [phone].

## 2022-03-27 NOTE — Telephone Encounter (Signed)
Request denied.  She is 4 weeks out from fracture at this point.  She should be relying on ibuprofen and Tylenol for pain relief and icing and elevation.  She really should be beyond narcotic prescriptions for pain regimen.  Again has an appointment in 48 hours so can maximize her Tylenol dose if needed.

## 2022-03-27 NOTE — Telephone Encounter (Signed)
Patient made aware.

## 2022-03-27 NOTE — Telephone Encounter (Signed)
Pt  called and is requesting Imaging to be scanned on disk, pt had mr  03/21/22* x-rays done 02/27/22  pt would prefer  to pick up before eod. Called imaging down stairs. They will have it done Eod , and contact the pt when its ready. Pt has scheduled appt on Monday at [place] with  Orthopedic Surgery  Roseanne Kaufman, MD.

## 2022-03-30 ENCOUNTER — Other Ambulatory Visit: Payer: Self-pay | Admitting: Orthopedic Surgery

## 2022-03-30 ENCOUNTER — Ambulatory Visit
Admission: RE | Admit: 2022-03-30 | Discharge: 2022-03-30 | Disposition: A | Discharge status: Home / Self Care | Payer: Medicare HMO | Source: Ambulatory Visit | Attending: Orthopedic Surgery | Admitting: Orthopedic Surgery

## 2022-03-30 DIAGNOSIS — S63502A Unspecified sprain of left wrist, initial encounter: Secondary | ICD-10-CM | POA: Diagnosis not present

## 2022-03-30 DIAGNOSIS — S52502A Unspecified fracture of the lower end of left radius, initial encounter for closed fracture: Secondary | ICD-10-CM | POA: Diagnosis not present

## 2022-03-30 DIAGNOSIS — M25532 Pain in left wrist: Secondary | ICD-10-CM | POA: Diagnosis not present

## 2022-03-30 DIAGNOSIS — Z981 Arthrodesis status: Secondary | ICD-10-CM | POA: Diagnosis not present

## 2022-03-30 DIAGNOSIS — R200 Anesthesia of skin: Secondary | ICD-10-CM | POA: Diagnosis not present

## 2022-03-30 DIAGNOSIS — Z9889 Other specified postprocedural states: Secondary | ICD-10-CM | POA: Diagnosis not present

## 2022-04-01 DIAGNOSIS — S63502A Unspecified sprain of left wrist, initial encounter: Secondary | ICD-10-CM | POA: Diagnosis not present

## 2022-04-01 DIAGNOSIS — S52502A Unspecified fracture of the lower end of left radius, initial encounter for closed fracture: Secondary | ICD-10-CM | POA: Diagnosis not present

## 2022-04-01 DIAGNOSIS — R200 Anesthesia of skin: Secondary | ICD-10-CM | POA: Diagnosis not present

## 2022-04-01 DIAGNOSIS — M25532 Pain in left wrist: Secondary | ICD-10-CM | POA: Diagnosis not present

## 2022-04-06 DIAGNOSIS — G35 Multiple sclerosis: Secondary | ICD-10-CM | POA: Diagnosis not present

## 2022-04-07 DIAGNOSIS — S52502P Unspecified fracture of the lower end of left radius, subsequent encounter for closed fracture with malunion: Secondary | ICD-10-CM | POA: Diagnosis not present

## 2022-04-14 DIAGNOSIS — Z96651 Presence of right artificial knee joint: Secondary | ICD-10-CM | POA: Diagnosis not present

## 2022-04-14 DIAGNOSIS — S81011A Laceration without foreign body, right knee, initial encounter: Secondary | ICD-10-CM | POA: Diagnosis not present

## 2022-04-14 DIAGNOSIS — W19XXXA Unspecified fall, initial encounter: Secondary | ICD-10-CM | POA: Diagnosis not present

## 2022-04-14 DIAGNOSIS — M25561 Pain in right knee: Secondary | ICD-10-CM | POA: Diagnosis not present

## 2022-04-15 DIAGNOSIS — S99912A Unspecified injury of left ankle, initial encounter: Secondary | ICD-10-CM | POA: Diagnosis not present

## 2022-04-15 DIAGNOSIS — M7989 Other specified soft tissue disorders: Secondary | ICD-10-CM | POA: Diagnosis not present

## 2022-04-15 DIAGNOSIS — M25572 Pain in left ankle and joints of left foot: Secondary | ICD-10-CM | POA: Diagnosis not present

## 2022-04-15 DIAGNOSIS — S93432A Sprain of tibiofibular ligament of left ankle, initial encounter: Secondary | ICD-10-CM | POA: Diagnosis not present

## 2022-04-20 ENCOUNTER — Other Ambulatory Visit: Payer: Self-pay

## 2022-04-20 ENCOUNTER — Other Ambulatory Visit: Payer: Self-pay | Admitting: Neurology

## 2022-04-20 DIAGNOSIS — F411 Generalized anxiety disorder: Secondary | ICD-10-CM

## 2022-04-20 MED ORDER — CLONAZEPAM 0.5 MG PO TABS: 0.5000 mg | ORAL_TABLET | Freq: Two times a day (BID) | ORAL | 0 refills | Status: DC

## 2022-04-20 NOTE — Telephone Encounter (Signed)
Paige Lozano called and left a message stating she is traveling and needs a refill on clonazepam. Pended prescription and pharmacy.

## 2022-04-20 NOTE — Telephone Encounter (Signed)
Sent to pharmacy 

## 2022-04-20 NOTE — Telephone Encounter (Signed)
Called [facility] and confirmed patient only filled #60 tablets on 03/16/2022. Can you send RX to pharmacy requested? Pended.

## 2022-04-20 NOTE — Telephone Encounter (Signed)
Patient made aware. RX sent to pharmacy.  

## 2022-04-20 NOTE — Telephone Encounter (Signed)
Patient left vm asking for Clonazepam RX to be sent to pharmacy where she is traveling. She states she has been out for 3 days. She wants this sent to [facility] on [address] in [place]. [phone].  This was sent to [facility] on 03/16/2022 #180 with 1 refill. I tried to call [facility] and they are not yet open [phone]. Will try again when they are open.

## 2022-04-22 DIAGNOSIS — M25461 Effusion, right knee: Secondary | ICD-10-CM | POA: Diagnosis not present

## 2022-04-22 DIAGNOSIS — Z9889 Other specified postprocedural states: Secondary | ICD-10-CM | POA: Diagnosis not present

## 2022-04-22 DIAGNOSIS — Z4802 Encounter for removal of sutures: Secondary | ICD-10-CM | POA: Diagnosis not present

## 2022-04-22 DIAGNOSIS — Z96651 Presence of right artificial knee joint: Secondary | ICD-10-CM | POA: Diagnosis not present

## 2022-04-22 DIAGNOSIS — Z4789 Encounter for other orthopedic aftercare: Secondary | ICD-10-CM | POA: Diagnosis not present

## 2022-04-22 DIAGNOSIS — Z471 Aftercare following joint replacement surgery: Secondary | ICD-10-CM | POA: Diagnosis not present

## 2022-05-06 DIAGNOSIS — M25632 Stiffness of left wrist, not elsewhere classified: Secondary | ICD-10-CM | POA: Diagnosis not present

## 2022-05-06 DIAGNOSIS — Z4789 Encounter for other orthopedic aftercare: Secondary | ICD-10-CM | POA: Diagnosis not present

## 2022-05-11 ENCOUNTER — Other Ambulatory Visit (HOSPITAL_COMMUNITY): Payer: Self-pay | Admitting: Psychiatry

## 2022-05-11 DIAGNOSIS — F5101 Primary insomnia: Secondary | ICD-10-CM

## 2022-05-13 ENCOUNTER — Other Ambulatory Visit: Payer: Self-pay | Admitting: Physician Assistant

## 2022-05-13 DIAGNOSIS — S6992XA Unspecified injury of left wrist, hand and finger(s), initial encounter: Secondary | ICD-10-CM

## 2022-05-14 ENCOUNTER — Other Ambulatory Visit: Payer: Self-pay | Admitting: Neurology

## 2022-05-14 DIAGNOSIS — S6992XA Unspecified injury of left wrist, hand and finger(s), initial encounter: Secondary | ICD-10-CM

## 2022-05-14 MED ORDER — METHOCARBAMOL 500 MG PO TABS: 500.0000 mg | ORAL_TABLET | Freq: Three times a day (TID) | ORAL | 0 refills | Status: DC

## 2022-05-18 ENCOUNTER — Other Ambulatory Visit: Payer: Self-pay | Admitting: Neurology

## 2022-05-18 DIAGNOSIS — F411 Generalized anxiety disorder: Secondary | ICD-10-CM

## 2022-05-18 DIAGNOSIS — G35 Multiple sclerosis: Secondary | ICD-10-CM | POA: Diagnosis not present

## 2022-05-18 NOTE — Telephone Encounter (Signed)
Patient called Paige Lozano wanting a refill on Clonazepam to [facility].  Last written 04/20/2022 for one month. Please advise.

## 2022-05-19 MED ORDER — CLONAZEPAM 0.5 MG PO TABS: 0.5000 mg | ORAL_TABLET | Freq: Two times a day (BID) | ORAL | 2 refills | Status: DC

## 2022-05-19 NOTE — Telephone Encounter (Signed)
..  PDMP reviewed during this encounter. Sent refills.

## 2022-06-04 ENCOUNTER — Other Ambulatory Visit: Payer: Self-pay | Admitting: Physician Assistant

## 2022-06-04 DIAGNOSIS — S6992XA Unspecified injury of left wrist, hand and finger(s), initial encounter: Secondary | ICD-10-CM

## 2022-06-15 DIAGNOSIS — Z4789 Encounter for other orthopedic aftercare: Secondary | ICD-10-CM | POA: Diagnosis not present

## 2022-06-29 DIAGNOSIS — G35 Multiple sclerosis: Secondary | ICD-10-CM | POA: Diagnosis not present

## 2022-07-06 DIAGNOSIS — M6210 Other rupture of muscle (nontraumatic), unspecified site: Secondary | ICD-10-CM | POA: Diagnosis not present

## 2022-07-06 DIAGNOSIS — G35 Multiple sclerosis: Secondary | ICD-10-CM | POA: Diagnosis not present

## 2022-07-06 DIAGNOSIS — M791 Myalgia, unspecified site: Secondary | ICD-10-CM | POA: Diagnosis not present

## 2022-07-06 DIAGNOSIS — E559 Vitamin D deficiency, unspecified: Secondary | ICD-10-CM | POA: Diagnosis not present

## 2022-07-06 DIAGNOSIS — R202 Paresthesia of skin: Secondary | ICD-10-CM | POA: Diagnosis not present

## 2022-07-06 DIAGNOSIS — R270 Ataxia, unspecified: Secondary | ICD-10-CM | POA: Diagnosis not present

## 2022-07-06 DIAGNOSIS — R200 Anesthesia of skin: Secondary | ICD-10-CM | POA: Diagnosis not present

## 2022-07-06 DIAGNOSIS — R5383 Other fatigue: Secondary | ICD-10-CM | POA: Diagnosis not present

## 2022-07-06 DIAGNOSIS — R413 Other amnesia: Secondary | ICD-10-CM | POA: Diagnosis not present

## 2022-07-06 DIAGNOSIS — E538 Deficiency of other specified B group vitamins: Secondary | ICD-10-CM | POA: Diagnosis not present

## 2022-07-06 DIAGNOSIS — R262 Difficulty in walking, not elsewhere classified: Secondary | ICD-10-CM | POA: Diagnosis not present

## 2022-07-14 DIAGNOSIS — G35 Multiple sclerosis: Secondary | ICD-10-CM | POA: Diagnosis not present

## 2022-07-14 DIAGNOSIS — M47814 Spondylosis without myelopathy or radiculopathy, thoracic region: Secondary | ICD-10-CM | POA: Diagnosis not present

## 2022-07-14 DIAGNOSIS — R9082 White matter disease, unspecified: Secondary | ICD-10-CM | POA: Diagnosis not present

## 2022-07-21 ENCOUNTER — Other Ambulatory Visit: Payer: Self-pay | Admitting: Physician Assistant

## 2022-07-21 DIAGNOSIS — S6992XA Unspecified injury of left wrist, hand and finger(s), initial encounter: Secondary | ICD-10-CM

## 2022-07-21 NOTE — Telephone Encounter (Signed)
Last written 06/04/2022 #90 no refills  Last appt 02/27/2022 Looks like this was for acute injury, did you want to send refill?

## 2022-08-12 DIAGNOSIS — G35 Multiple sclerosis: Secondary | ICD-10-CM | POA: Diagnosis not present

## 2022-09-07 ENCOUNTER — Other Ambulatory Visit: Payer: Self-pay | Admitting: Physician Assistant

## 2022-09-07 DIAGNOSIS — Z0000 Encounter for general adult medical examination without abnormal findings: Secondary | ICD-10-CM

## 2022-09-07 DIAGNOSIS — Z1231 Encounter for screening mammogram for malignant neoplasm of breast: Secondary | ICD-10-CM

## 2022-09-11 ENCOUNTER — Other Ambulatory Visit: Payer: Self-pay | Admitting: Physician Assistant

## 2022-09-11 DIAGNOSIS — S6992XA Unspecified injury of left wrist, hand and finger(s), initial encounter: Secondary | ICD-10-CM

## 2022-09-18 ENCOUNTER — Other Ambulatory Visit: Payer: Self-pay | Admitting: Neurology

## 2022-09-18 ENCOUNTER — Ambulatory Visit (INDEPENDENT_AMBULATORY_CARE_PROVIDER_SITE_OTHER): Payer: Medicare HMO | Admitting: Physician Assistant

## 2022-09-18 DIAGNOSIS — Z716 Tobacco abuse counseling: Secondary | ICD-10-CM

## 2022-09-18 DIAGNOSIS — S6992XA Unspecified injury of left wrist, hand and finger(s), initial encounter: Secondary | ICD-10-CM

## 2022-09-18 DIAGNOSIS — F17299 Nicotine dependence, other tobacco product, with unspecified nicotine-induced disorders: Secondary | ICD-10-CM

## 2022-09-18 DIAGNOSIS — Z0000 Encounter for general adult medical examination without abnormal findings: Secondary | ICD-10-CM | POA: Diagnosis not present

## 2022-09-18 MED ORDER — VARENICLINE TARTRATE 1 MG PO TABS: 1.0000 mg | ORAL_TABLET | Freq: Two times a day (BID) | ORAL | 3 refills | Status: DC

## 2022-09-18 MED ORDER — GABAPENTIN 300 MG PO CAPS: ORAL_CAPSULE | ORAL | 0 refills | Status: DC

## 2022-09-18 NOTE — Progress Notes (Signed)
MEDICARE ANNUAL WELLNESS VISIT  09/18/2022  Telephone Visit Disclaimer This Medicare AWV was conducted by telephone due to national recommendations for restrictions regarding the COVID-19 Pandemic (e.g. social distancing).  I verified, using two identifiers, that I am speaking with Paige Lozano or their authorized healthcare agent. I discussed the limitations, risks, security, and privacy concerns of performing an evaluation and management service by telephone and the potential availability of an in-person appointment in the future. The patient expressed understanding and agreed to proceed.  Location of Patient: Home Location of Provider (nurse):  Provider home  Subjective:    Paige Lozano is a 56 y.o. female patient of Alden Hipp, Royetta Car, PA-C who had a [employer] Visit today via telephone. Paige Lozano is Working part time and lives alone. she does not any children; her daughter passed away. she reports that she is socially active and does interact with friends/family regularly. she is minimally physically active and enjoys working out, reading and spending time with her friends.  Patient Care Team: Lavada Mesi as PCP - General (Family Medicine) Richardo Priest, MD as PCP - Cardiology (Cardiology)     09/18/2022   10:13 AM 09/12/2021    1:11 PM 08/11/2016   11:14 AM 07/27/2016   12:16 PM 07/22/2016   10:12 AM 05/29/2016    4:03 PM  Advanced Directives  Does Patient Have a Medical Advance Directive? No No Yes No No Yes  Type of Advance Directive   Living will   Living will;Out of facility DNR (pink MOST or yellow form)  Copy of [place] in Chart?   No - copy requested     Would patient like information on creating a medical advance directive? No - Patient declined No - Patient declined  No - patient declined information      Hospital Utilization Over the Past 12 Months: # of hospitalizations or ER visits: 0 # of surgeries: 2  Review of  Systems    Patient reports that her overall health is unchanged compared to last year.  History obtained from chart review and the patient  Patient Reported Readings (BP, Pulse, CBG, Weight, etc) none  Pain Assessment Pain : No/denies pain     Current Medications & Allergies (verified) Allergies as of 09/18/2022       Reactions   Bergera Koenigii (curry Tree) Rico Junker Koenigii] Anaphylaxis, Swelling, Hives   Ketamine Anaphylaxis   Other Anaphylaxis, Hives, Swelling   curry curry   Oxycodone Diarrhea, Other (See Comments)   Papaya Enzyme Anaphylaxis   Digestive Enzymes Swelling   Other reaction(s): Throat Swelling   Oxycodone Hcl Hives        Medication List        Accurate as of September 18, 2022 10:49 AM. If you have any questions, ask your nurse or doctor.          albuterol 108 (90 Base) MCG/ACT inhaler Commonly known as: VENTOLIN HFA INHALE 2 PUFFS INTO THE LUNGS EVERY 6 HOURS AS NEEDED FOR WHEEZING OR SHORTNESS OF BREATH   Biotin 10 MG Caps Take by mouth daily.   calcium carbonate 1500 (600 Ca) MG Tabs tablet Commonly known as: OSCAL Take 600 mg by mouth in the morning and at bedtime.   Cholecalciferol 25 MCG (1000 UT) tablet Take by mouth.   clonazePAM 0.5 MG tablet Commonly known as: KLONOPIN Take 1 tablet (0.5 mg total) by mouth in the morning and at bedtime.   cyanocobalamin 1000  MCG tablet Commonly known as: VITAMIN B12 Take 1,000 mcg by mouth daily.   diclofenac Sodium 1 % Gel Commonly known as: VOLTAREN Apply 4 g topically 4 (four) times daily. To affected joint.   ferrous sulfate 325 (65 FE) MG tablet Take 1 tablet by mouth daily.   fluticasone 50 MCG/ACT nasal spray Commonly known as: FLONASE Place 2 sprays into both nostrils daily.   folic acid 1 MG tablet Commonly known as: FOLVITE Take 1 mg by mouth daily.   gabapentin 300 MG capsule Commonly known as: NEURONTIN TAKE 1 CAPSULE THREE TIMES DAILY    HYDROcodone-acetaminophen 5-325 MG tablet Commonly known as: NORCO/VICODIN Take 1 tablet by mouth every 6 (six) hours as needed for moderate pain.   ibuprofen 800 MG tablet Commonly known as: ADVIL Take 800 mg by mouth as needed.   lamoTRIgine 200 MG tablet Commonly known as: LAMICTAL TAKE 1 TABLET TWICE DAILY   methocarbamol 500 MG tablet Commonly known as: ROBAXIN TAKE 1 TABLET THREE TIMES DAILY   Multi-Vitamin tablet Take 1 tablet by mouth daily.   QUEtiapine 400 MG tablet Commonly known as: SEROQUEL TAKE 1 TABLET EVERY DAY   senna-docusate 8.6-50 MG tablet Commonly known as: Senokot-S Take 2 tablets by mouth 2 (two) times daily.   sulfaSALAzine 500 MG tablet Commonly known as: AZULFIDINE Take by mouth.   Tysabri 300 MG/15ML injection Generic drug: natalizumab Inject into the vein.   varenicline 1 MG tablet Commonly known as: Chantix Take 1 tablet (1 mg total) by mouth 2 (two) times daily.   vitamin E 1000 UNIT capsule Take 1,000 Units by mouth daily.   Xarelto 10 MG Tabs tablet Generic drug: rivaroxaban Take 10 mg by mouth daily.        History (reviewed): Past Medical History:  Diagnosis Date   Anxiety    Bipolar 2 disorder, major depressive episode ([place])    Complication of anesthesia    COPD (chronic obstructive pulmonary disease) (HCC)    smoker   Fibromyalgia    Insomnia    Multiple sclerosis (HCC)    PONV (postoperative nausea and vomiting)    Pulmonary embolism ([place]) 2000   Sleep apnea    mild OSA no CPAP   Ulcerative colitis ([place])    Past Surgical History:  Procedure Laterality Date   FOOT SURGERY Left    bone spur   HEMORRHOID SURGERY     HUMERUS FRACTURE SURGERY Left    KNEE SURGERY Right 12/2021   MYOMECTOMY     NASAL SINUS SURGERY     SHOULDER ARTHROSCOPY WITH SUBACROMIAL DECOMPRESSION AND BICEP TENDON REPAIR Left 07/27/2016   Procedure: SHOULDER ARTHROSCOPY DEBRIDEMENT ROTATOR CUFF AND LABRUM, SUBACROMIAL DECOMPRESSION,  BICEPS TENODESIS;  Surgeon: Tania Ade, MD;  Location: [facility];  Service: Orthopedics;  Laterality: Left;  SHOULDER ARTHROSCOPY DEBRIDEMENT ROTATOR CUFF AND LABRUM, SUBACROMIAL DECOMPRESSION, BICEPS TENOTOMY, POSSIBLE TENODESIS   THUMB ARTHROSCOPY     5 surgeries on left thumb, 4 surgeries on right   TONSILLECTOMY AND ADENOIDECTOMY     WRIST FRACTURE SURGERY Left 2023   Family History  Adopted: Yes  Problem Relation Age of Onset   Breast cancer Sister        Triple Negative   Social History   Socioeconomic History   Marital status: Divorced    Spouse name: Not on file   Number of children: 1   Years of education: 16   Highest education level: Bachelor's degree (e.g., BA, AB, BS)  Occupational  History   Occupation: Legally disabled  Tobacco Use   Smoking status: Former    Packs/day: 0.25    Years: 29.00    Total pack years: 7.25    Types: Cigarettes    Quit date: 07/08/2016    Years since quitting: 6.2   Smokeless tobacco: Never  Vaping Use   Vaping Use: Every day   Substances: Nicotine  Substance and Sexual Activity   Alcohol use: Yes    Alcohol/week: 0.0 standard drinks of alcohol    Comment: rare    Drug use: Yes    Types: Marijuana    Comment: occassional   Sexual activity: Yes    Partners: Male    Birth control/protection: None  Other Topics Concern   Not on file  Social History Narrative   Lives alone with her two dogs. She had a daughter who has passed away. Likes to work out and enjoys spending time with friends.   Social Determinants of Health   Financial Resource Strain: Medium Risk (09/18/2022)   Overall Financial Resource Strain (CARDIA)    Difficulty of Paying Living Expenses: Somewhat hard  Food Insecurity: Food Insecurity Present (09/18/2022)   Hunger Vital Sign    Worried About Running Out of Food in the Last Year: Sometimes true    Ran Out of Food in the Last Year: Sometimes true  Transportation Needs: No Transportation  Needs (09/18/2022)   PRAPARE - Hydrologist (Medical): No    Lack of Transportation (Non-Medical): No  Physical Activity: Sufficiently Active (09/18/2022)   Exercise Vital Sign    Days of Exercise per Week: 7 days    Minutes of Exercise per Session: 30 min  Stress: Stress Concern Present (09/18/2022)   [place]    Feeling of Stress : To some extent  Social Connections: Moderately Isolated (09/18/2022)   Social Connection and Isolation Panel [NHANES]    Frequency of Communication with Friends and Family: More than three times a week    Frequency of Social Gatherings with Friends and Family: More than three times a week    Attends Religious Services: More than 4 times per year    Active Member of [employer] or Organizations: No    Attends Archivist Meetings: Never    Marital Status: Divorced    Activities of Daily Living    09/18/2022   10:29 AM  In your present state of health, do you have any difficulty performing the following activities:  Hearing? 1  Comment plans to get hearing aids.  Vision? 1  Difficulty concentrating or making decisions? 1  Comment memory loss  Walking or climbing stairs? 1  Comment has M.S. which causes fatigue and cognitive issues.  Dressing or bathing? 0  Doing errands, shopping? 0  Preparing Food and eating ? N  Using the Toilet? N  In the past six months, have you accidently leaked urine? N  Do you have problems with loss of bowel control? N  Managing your Medications? N  Managing your Finances? Y  Comment due to a difficult year; she did have some difficulty but she is working on getting things better.  Housekeeping or managing your Housekeeping? N    Patient Education/ Literacy How often do you need to have someone help you when you read instructions, pamphlets, or other written materials from your doctor or pharmacy?: 1 - Never What is the  last grade level you completed in  school?: Bachelor's degree  Exercise Current Exercise Habits: Home exercise routine, Type of exercise: walking, Time (Minutes): 30, Frequency (Times/Week): 7, Weekly Exercise (Minutes/Week): 210, Intensity: Moderate, Exercise limited by: orthopedic condition(s)  Diet Patient reports consuming 1 meals a day and 0 snack(s) a day Patient reports that her primary diet is: Regular Patient reports that she does have regular access to food.   Depression Screen    09/18/2022   10:09 AM 11/25/2021    3:23 PM 09/12/2021    1:14 PM 03/08/2019   10:00 AM 08/06/2017   11:25 AM  PHQ 2/9 Scores  PHQ - 2 Score '6 4 5 6 '$ 0  PHQ- 9 Score '16 10 9 21      '$ Fall Risk    09/18/2022   10:14 AM 09/12/2021    1:14 PM  Fall Risk   Falls in the past year? 1 1  Number falls in past yr: 1 1  Injury with Fall? 1 1  Risk for fall due to : History of fall(s);Impaired balance/gait History of fall(s);Impaired balance/gait  Follow up Falls evaluation completed;Education provided;Falls prevention discussed Falls evaluation completed;Education provided;Falls prevention discussed     Objective:  Paige Lozano seemed alert and oriented and she participated appropriately during our telephone visit.  Blood Pressure Weight BMI  BP Readings from Last 3 Encounters:  02/27/22 134/77  11/25/21 121/68  12/10/20 100/70   Wt Readings from Last 3 Encounters:  02/27/22 155 lb (70.3 kg)  11/25/21 160 lb (72.6 kg)  09/12/21 156 lb 1.6 oz (70.8 kg)   BMI Readings from Last 1 Encounters:  02/27/22 23.22 kg/m    *Unable to obtain current vital signs, weight, and BMI due to telephone visit type  Hearing/Vision  Paige Lozano did not seem to have difficulty with hearing/understanding during the telephone conversation Reports that she has had a formal eye exam by an eye care professional within the past year Reports that she has had a formal hearing evaluation within the past year *Unable to fully  assess hearing and vision during telephone visit type  Cognitive Function:    09/18/2022   10:36 AM 09/12/2021    1:47 PM  6CIT Screen  What Year? 0 points 0 points  What month? 0 points 0 points  What time? 0 points 0 points  Count back from 20 4 points 2 points  Months in reverse 0 points 0 points  Repeat phrase 2 points 2 points  Total Score 6 points 4 points   (Normal:0-7, Significant for Dysfunction: >8)  Normal Cognitive Function Screening: Yes   Immunization & Health Maintenance Record Immunization History  Administered Date(s) Administered   Influenza,inj,Quad PF,6+ Mos 12/06/2018   Influenza,inj,quad, With Preservative 08/20/2009   Influenza-Unspecified 07/10/2016   Moderna SARS-COV2 Booster Vaccination 10/25/2021   Moderna Sars-Covid-2 Vaccination 02/17/2020, 03/16/2020, 06/09/2020   Pneumococcal Polysaccharide-23 12/17/2015   Tdap 05/25/2011, 08/04/2018, 10/11/2020    Health Maintenance  Topic Date Due   PAP SMEAR-Modifier  09/18/2022 (Originally 10/06/1987)   COVID-19 Vaccine (4 - Moderna risk series) 10/04/2022 (Originally 12/20/2021)   Zoster Vaccines- Shingrix (1 of 2) 12/19/2022 (Originally 10/05/1985)   INFLUENZA VACCINE  02/07/2023 (Originally 06/09/2022)   MAMMOGRAM  09/19/2023 (Originally 12/08/2020)   DEXA SCAN  09/19/2023 (Originally 1966-08-27)   Hepatitis C Screening  09/19/2023 (Originally 10/05/1984)   HIV Screening  09/19/2023 (Originally 10/05/1981)   Medicare Annual Wellness (AWV)  09/19/2023   TETANUS/TDAP  10/11/2030   COLONOSCOPY (Pts 45-18yr Insurance coverage will need to be confirmed)  07/03/2031   HPV VACCINES  Aged Out       Assessment  This is a routine wellness examination for Paige Lozano.  Health Maintenance: Due or Overdue There are no preventive care reminders to display for this patient.   Paige Lozano does not need a referral for Community Assistance: Care Management:   no Social Work:    no Prescription  Assistance:  no Nutrition/Diabetes Education:  no   Plan:  Personalized Goals  Goals Addressed               This Visit's Progress     Patient Stated (pt-stated)        09/18/2022 AWV Goal: Tobacco Cessation  Smoking cessation instruction/counseling given:  counseled patient on the dangers of tobacco use, advised patient to stop smoking, and reviewed strategies to maximize success  Patient will verbalize understanding of the health risks associated with smoking/tobacco use Lung cancer or lung disease, such as COPD Heart disease. Stroke. Heart attack Infertility Osteoporosis and bone fractures. Patient will create a plan to quit smoking/using tobacco Pick a date to quit.  Write down the reasons why you are quitting and put it where you will see it often. Identify the people, places, things, and activities that make you want to smoke (triggers) and avoid them. Make sure to take these actions: Throw away all cigarettes at home, at work, and in your car. Throw away smoking accessories, such as Scientist, research (medical). Clean your car and make sure to empty the ashtray. Clean your home, including curtains and carpets. Tell your family, friends, and coworkers that you are quitting. Support from your loved ones can make quitting easier. Talk with your health care provider about your options for quitting smoking. Find out what treatment options are covered by your health insurance. Patient will be able to demonstrate knowledge of tobacco cessation strategies that may maximize success Quitting "cold [place]" is more successful than gradually quitting. Attending in-person counseling to help you build problem-solving skills.  Finding resources and support systems that can help you to quit smoking and remain smoke-free after you quit. These resources are most helpful when you use them often. They can include: Online chats with a Social worker. Telephone quitlines. Printed Social research officer, government. Support groups or group counseling. Text messaging programs. Mobile phone applications. Taking medicines to help you quit smoking: Nicotine patches, gum, or lozenges. Nicotine inhalers or sprays. Non-nicotine medicine that is taken by mouth. Patient will note get discouraged if the process is difficult Over the next year, patient will stop smoking or using other forms of tobacco  Smoking cessation instruction/counseling given:  counseled patient on the dangers of tobacco use, advised patient to stop smoking, and reviewed strategies to maximize success        Patient Stated (pt-stated)        09/18/2022 AWV Goal: Exercise for General Health  Patient will verbalize understanding of the benefits of increased physical activity: Exercising regularly is important. It will improve your overall fitness, flexibility, and endurance. Regular exercise also will improve your overall health. It can help you control your weight, reduce stress, and improve your bone density. Over the next year, patient will increase physical activity as tolerated with a goal of at least 150 minutes of moderate physical activity per week.  You can tell that you are exercising at a moderate intensity if your heart starts beating faster and you start breathing faster but can still hold a conversation. Moderate-intensity exercise ideas include: Walking 1  mile (1.6 km) in about 15 minutes [place] aerobics Patient will verbalize understanding of everyday activities that increase physical activity by providing examples like the following: Yard work, such as: Sales promotion account executive Gardening Washing windows or floors Patient will be able to explain general safety guidelines for exercising:  Before you start a new exercise program, talk with your health care provider. Do not exercise so much that you hurt  yourself, feel dizzy, or get very short of breath. Wear comfortable clothes and wear shoes with good support. Drink plenty of water while you exercise to prevent dehydration or heat stroke. Work out until your breathing and your heartbeat get faster.       Patient Stated (pt-stated)        Would like to work on her mental health.       Personalized Health Maintenance & Screening Recommendations  Influenza vaccine Screening mammography Bone densitometry screening Smoking cessation counseling Shingles vaccine  Lung Cancer Screening Recommended: not applicable (Low Dose CT Chest recommended if Age 52-80 years, 30 pack-year currently smoking OR have quit w/in past 15 years) Hepatitis C Screening recommended: yes HIV Screening recommended: yes  Advanced Directives: Written information was not prepared per patient's request.  Referrals & Orders No orders of the defined types were placed in this encounter.   Follow-up Plan Follow-up with Donella Stade, PA-C as planned Patient will schedule shingles vaccine and influenza vaccine. Discuss bone density scan with PCP. Medicare wellness visit in one year.  Patient will access AVS on my chart.   I have personally reviewed and noted the following in the patient's chart:   Medical and social history Use of alcohol, tobacco or illicit drugs  Current medications and supplements Functional ability and status Nutritional status Physical activity Advanced directives List of other physicians Hospitalizations, surgeries, and ER visits in previous 12 months Vitals Screenings to include cognitive, depression, and falls Referrals and appointments  In addition, I have reviewed and discussed with Paige Lozano certain preventive protocols, quality metrics, and best practice recommendations. A written personalized care plan for preventive services as well as general preventive health recommendations is available and can be mailed to the  patient at her request.      Tinnie Gens, RN BSN  09/18/2022

## 2022-09-18 NOTE — Patient Instructions (Addendum)
[facility] Maintenance Summary and Written Plan of Care  [name] ,  Thank you for allowing me to perform your Medicare Annual Wellness Visit and for your ongoing commitment to your health.   Health Maintenance & Immunization History Health Maintenance  Topic Date Due   PAP SMEAR-Modifier  09/18/2022 (Originally 10/06/1987)   COVID-19 Vaccine (4 - Moderna risk series) 10/04/2022 (Originally 12/20/2021)   Zoster Vaccines- Shingrix (1 of 2) 12/19/2022 (Originally 10/05/1985)   INFLUENZA VACCINE  02/07/2023 (Originally 06/09/2022)   MAMMOGRAM  09/19/2023 (Originally 12/08/2020)   DEXA SCAN  09/19/2023 (Originally 07/04/1966)   Hepatitis C Screening  09/19/2023 (Originally 10/05/1984)   HIV Screening  09/19/2023 (Originally 10/05/1981)   Medicare Annual Wellness (AWV)  09/19/2023   TETANUS/TDAP  10/11/2030   COLONOSCOPY (Pts 45-32yr Insurance coverage will need to be confirmed)  07/03/2031   HPV VACCINES  Aged Out   Immunization History  Administered Date(s) Administered   Influenza,inj,Quad PF,6+ Mos 12/06/2018   Influenza,inj,quad, With Preservative 08/20/2009   Influenza-Unspecified 07/10/2016   Moderna SARS-COV2 Booster Vaccination 10/25/2021   Moderna Sars-Covid-2 Vaccination 02/17/2020, 03/16/2020, 06/09/2020   Pneumococcal Polysaccharide-23 12/17/2015   Tdap 05/25/2011, 08/04/2018, 10/11/2020    These are the patient goals that we discussed:  Goals Addressed               This Visit's Progress     Patient Stated (pt-stated)        09/18/2022 AWV Goal: Tobacco Cessation  Smoking cessation instruction/counseling given:  counseled patient on the dangers of tobacco use, advised patient to stop smoking, and reviewed strategies to maximize success  Patient will verbalize understanding of the health risks associated with smoking/tobacco use Lung cancer or lung disease, such as COPD Heart disease. Stroke. Heart  attack Infertility Osteoporosis and bone fractures. Patient will create a plan to quit smoking/using tobacco Pick a date to quit.  Write down the reasons why you are quitting and put it where you will see it often. Identify the people, places, things, and activities that make you want to smoke (triggers) and avoid them. Make sure to take these actions: Throw away all cigarettes at home, at work, and in your car. Throw away smoking accessories, such as aScientist, research (medical) Clean your car and make sure to empty the ashtray. Clean your home, including curtains and carpets. Tell your family, friends, and coworkers that you are quitting. Support from your loved ones can make quitting easier. Talk with your health care provider about your options for quitting smoking. Find out what treatment options are covered by your health insurance. Patient will be able to demonstrate knowledge of tobacco cessation strategies that may maximize success Quitting "cold [place] is more successful than gradually quitting. Attending in-person counseling to help you build problem-solving skills.  Finding resources and support systems that can help you to quit smoking and remain smoke-free after you quit. These resources are most helpful when you use them often. They can include: Online chats with a cSocial worker Telephone quitlines. Printed sFurniture conservator/restorer Support groups or group counseling. Text messaging programs. Mobile phone applications. Taking medicines to help you quit smoking: Nicotine patches, gum, or lozenges. Nicotine inhalers or sprays. Non-nicotine medicine that is taken by mouth. Patient will note get discouraged if the process is difficult Over the next year, patient will stop smoking or using other forms of tobacco  Smoking cessation instruction/counseling given:  counseled patient on the dangers of tobacco use, advised patient to stop smoking, and  reviewed strategies to maximize success         Patient Stated (pt-stated)        09/18/2022 AWV Goal: Exercise for General Health  Patient will verbalize understanding of the benefits of increased physical activity: Exercising regularly is important. It will improve your overall fitness, flexibility, and endurance. Regular exercise also will improve your overall health. It can help you control your weight, reduce stress, and improve your bone density. Over the next year, patient will increase physical activity as tolerated with a goal of at least 150 minutes of moderate physical activity per week.  You can tell that you are exercising at a moderate intensity if your heart starts beating faster and you start breathing faster but can still hold a conversation. Moderate-intensity exercise ideas include: Walking 1 mile (1.6 km) in about 15 minutes Biking Hiking Golfing Dancing Water aerobics Patient will verbalize understanding of everyday activities that increase physical activity by providing examples like the following: Yard work, such as: Sales promotion account executive Gardening Washing windows or floors Patient will be able to explain general safety guidelines for exercising:  Before you start a new exercise program, talk with your health care provider. Do not exercise so much that you hurt yourself, feel dizzy, or get very short of breath. Wear comfortable clothes and wear shoes with good support. Drink plenty of water while you exercise to prevent dehydration or heat stroke. Work out until your breathing and your heartbeat get faster.       Patient Stated (pt-stated)        Would like to work on her mental health.         This is a list of Health Maintenance Items that are overdue or due now: There are no preventive care reminders to display for this patient.  Influenza vaccine Screening mammography Bone densitometry screening Smoking cessation  counseling Shingles vaccine  Orders/Referrals Placed Today: No orders of the defined types were placed in this encounter.  (Contact our referral department at [phone] if you have not spoken with someone about your referral appointment within the next 5 days)    Follow-up Plan Follow-up with Donella Stade, PA-C as planned Patient will schedule shingles vaccine and influenza vaccine. Discuss bone density with PCP. Medicare wellness visit in one year.  Patient will access AVS on my char      Health Maintenance, Female Adopting a healthy lifestyle and getting preventive care are important in promoting health and wellness. Ask your health care provider about: The right schedule for you to have regular tests and exams. Things you can do on your own to prevent diseases and keep yourself healthy. What should I know about diet, weight, and exercise? Eat a healthy diet  Eat a diet that includes plenty of vegetables, fruits, low-fat dairy products, and lean protein. Do not eat a lot of foods that are high in solid fats, added sugars, or sodium. Maintain a healthy weight Body mass index (BMI) is used to identify weight problems. It estimates body fat based on height and weight. Your health care provider can help determine your BMI and help you achieve or maintain a healthy weight. Get regular exercise Get regular exercise. This is one of the most important things you can do for your health. Most adults should: Exercise for at least 150 minutes each week. The exercise should increase your heart rate and make you sweat (moderate-intensity exercise).  Do strengthening exercises at least twice a week. This is in addition to the moderate-intensity exercise. Spend less time sitting. Even light physical activity can be beneficial. Watch cholesterol and blood lipids Have your blood tested for lipids and cholesterol at 56 years of age, then have this test every 5 years. Have your cholesterol  levels checked more often if: Your lipid or cholesterol levels are high. You are older than 56 years of age. You are at high risk for heart disease. What should I know about cancer screening? Depending on your health history and family history, you may need to have cancer screening at various ages. This may include screening for: Breast cancer. Cervical cancer. Colorectal cancer. Skin cancer. Lung cancer. What should I know about heart disease, diabetes, and high blood pressure? Blood pressure and heart disease High blood pressure causes heart disease and increases the risk of stroke. This is more likely to develop in people who have high blood pressure readings or are overweight. Have your blood pressure checked: Every 3-5 years if you are 55-32 years of age. Every year if you are 49 years old or older. Diabetes Have regular diabetes screenings. This checks your fasting blood sugar level. Have the screening done: Once every three years after age 16 if you are at a normal weight and have a low risk for diabetes. More often and at a younger age if you are overweight or have a high risk for diabetes. What should I know about preventing infection? Hepatitis B If you have a higher risk for hepatitis B, you should be screened for this virus. Talk with your health care provider to find out if you are at risk for hepatitis B infection. Hepatitis C Testing is recommended for: Everyone born from [birth year] through 1965. Anyone with known risk factors for hepatitis C. Sexually transmitted infections (STIs) Get screened for STIs, including gonorrhea and chlamydia, if: You are sexually active and are younger than 57 years of age. You are older than 56 years of age and your health care provider tells you that you are at risk for this type of infection. Your sexual activity has changed since you were last screened, and you are at increased risk for chlamydia or gonorrhea. Ask your health care provider if  you are at risk. Ask your health care provider about whether you are at high risk for HIV. Your health care provider may recommend a prescription medicine to help prevent HIV infection. If you choose to take medicine to prevent HIV, you should first get tested for HIV. You should then be tested every 3 months for as long as you are taking the medicine. Pregnancy If you are about to stop having your period (premenopausal) and you may become pregnant, seek counseling before you get pregnant. Take 400 to 800 micrograms (mcg) of folic acid every day if you become pregnant. Ask for birth control (contraception) if you want to prevent pregnancy. Osteoporosis and menopause Osteoporosis is a disease in which the bones lose minerals and strength with aging. This can result in bone fractures. If you are 25 years old or older, or if you are at risk for osteoporosis and fractures, ask your health care provider if you should: Be screened for bone loss. Take a calcium or vitamin D supplement to lower your risk of fractures. Be given hormone replacement therapy (HRT) to treat symptoms of menopause. Follow these instructions at home: Alcohol use Do not drink alcohol if: Your health care provider tells you not  to drink. You are pregnant, may be pregnant, or are planning to become pregnant. If you drink alcohol: Limit how much you have to: 0-1 drink a day. Know how much alcohol is in your drink. In the [place] one drink equals one 12 oz bottle of beer (355 mL), one 5 oz glass of wine (148 mL), or one 1 oz glass of hard liquor (44 mL). Lifestyle Do not use any products that contain nicotine or tobacco. These products include cigarettes, chewing tobacco, and vaping devices, such as e-cigarettes. If you need help quitting, ask your health care provider. Do not use street drugs. Do not share needles. Ask your health care provider for help if you need support or information about quitting drugs. General  instructions Schedule regular health, dental, and eye exams. Stay current with your vaccines. Tell your health care provider if: You often feel depressed. You have ever been abused or do not feel safe at home. Summary Adopting a healthy lifestyle and getting preventive care are important in promoting health and wellness. Follow your health care provider's instructions about healthy diet, exercising, and getting tested or screened for diseases. Follow your health care provider's instructions on monitoring your cholesterol and blood pressure. This information is not intended to replace advice given to you by your health care provider. Make sure you discuss any questions you have with your health care provider. Document Revised: 03/17/2021 Document Reviewed: 03/17/2021 Elsevier Patient Education  [place].

## 2022-09-18 NOTE — Telephone Encounter (Signed)
-----   Message from Tinnie Gens, RN sent at 09/18/2022 10:50 AM EST ----- Regarding: refill Patient requested refills for chantix (one month supply) and gabapentin when she was completing her Medicare wellness.

## 2022-09-18 NOTE — Telephone Encounter (Signed)
Chantix was last sent 02/27/2022 #60 with 3 refills  Gabapentin last written 05/13/2022 #90 no refills  Last appt 02/27/2022 (before [place] today)

## 2022-09-23 DIAGNOSIS — G35 Multiple sclerosis: Secondary | ICD-10-CM | POA: Diagnosis not present

## 2022-10-07 ENCOUNTER — Other Ambulatory Visit (HOSPITAL_COMMUNITY): Payer: Self-pay | Admitting: Psychiatry

## 2022-10-07 DIAGNOSIS — F5101 Primary insomnia: Secondary | ICD-10-CM

## 2022-10-24 ENCOUNTER — Other Ambulatory Visit (HOSPITAL_COMMUNITY): Payer: Self-pay | Admitting: Psychiatry

## 2022-11-05 DIAGNOSIS — G35 Multiple sclerosis: Secondary | ICD-10-CM | POA: Diagnosis not present

## 2022-11-10 ENCOUNTER — Ambulatory Visit (INDEPENDENT_AMBULATORY_CARE_PROVIDER_SITE_OTHER): Payer: Medicare HMO | Admitting: Sports Medicine

## 2022-11-10 ENCOUNTER — Ambulatory Visit (INDEPENDENT_AMBULATORY_CARE_PROVIDER_SITE_OTHER): Payer: Medicare HMO

## 2022-11-10 DIAGNOSIS — M65841 Other synovitis and tenosynovitis, right hand: Secondary | ICD-10-CM | POA: Diagnosis not present

## 2022-11-10 MED ORDER — TRIAMCINOLONE ACETONIDE 40 MG/ML IJ SUSP
40.0000 mg | Freq: Once | INTRAMUSCULAR | Status: AC
  Administered 2022-11-10: 40 mg via INTRAMUSCULAR

## 2022-11-10 NOTE — Progress Notes (Signed)
    Procedures performed today:    Procedure: Real-time Ultrasound Guided injection of the right third flexor tendon sheath Device: Samsung HS60  Verbal informed consent obtained.  Time-out conducted.  Noted no overlying erythema, induration, or other signs of local infection.  Skin prepped in a sterile fashion.  Local anesthesia: Topical Ethyl chloride.  With sterile technique and under real time ultrasound guidance: Noted flexor tendon sheath swelling, flexor nodule, 0.5 cc lidocaine, 0.5 cc kenalog 40 injected easily. Completed without difficulty  Advised to call if fevers/chills, erythema, induration, drainage, or persistent bleeding.  Images permanently stored and available for review in PACS.  Impression: Technically successful ultrasound guided injection.  Independent interpretation of notes and tests performed by another provider:   None.  Brief History, Exam, Impression, and Recommendations:    Stenosing tenosynovitis of finger of right hand Pleasant 57 year old female, continues to have pain volar right hand, third digit, diagnosed her about a year ago with stenosing tenosynovitis, as symptoms continue in spite of conservative treatment we did a third flexor tendon sheath injection, return to see me in 6 weeks.    ____________________________________________ Gwen Her. Dianah Field, M.D., ABFM., CAQSM., AME. Primary Care and Sports Medicine [facility]  Adjunct Professor of [employer]  Risk manager

## 2022-11-10 NOTE — Addendum Note (Signed)
Addended by: Tarri Glenn A on: 11/10/2022 11:32 AM   Modules accepted: Orders

## 2022-11-10 NOTE — Assessment & Plan Note (Signed)
Pleasant 57 year old female, continues to have pain volar right hand, third digit, diagnosed her about a year ago with stenosing tenosynovitis, as symptoms continue in spite of conservative treatment we did a third flexor tendon sheath injection, return to see me in 6 weeks.

## 2022-11-19 ENCOUNTER — Other Ambulatory Visit: Payer: Self-pay | Admitting: Physician Assistant

## 2022-11-19 DIAGNOSIS — S6992XA Unspecified injury of left wrist, hand and finger(s), initial encounter: Secondary | ICD-10-CM

## 2022-12-18 DIAGNOSIS — G35 Multiple sclerosis: Secondary | ICD-10-CM | POA: Diagnosis not present

## 2022-12-24 ENCOUNTER — Other Ambulatory Visit: Payer: Self-pay | Admitting: Neurology

## 2022-12-24 DIAGNOSIS — F411 Generalized anxiety disorder: Secondary | ICD-10-CM

## 2022-12-24 NOTE — Telephone Encounter (Signed)
Patient called asking for refills of Clonazepam.   Last appt 02/27/2022 (saw Dr. Darene Lamer 11/10/2022)  Last written 05/19/2022 #60 with 2 refills

## 2022-12-25 MED ORDER — CLONAZEPAM 0.5 MG PO TABS: 0.5000 mg | ORAL_TABLET | Freq: Two times a day (BID) | ORAL | 0 refills | Status: DC

## 2022-12-25 NOTE — Telephone Encounter (Signed)
Needs follow up every 6 months. I will send one month.

## 2022-12-25 NOTE — Telephone Encounter (Signed)
One month sent, patient needs appt - please call.

## 2022-12-31 DIAGNOSIS — Z888 Allergy status to other drugs, medicaments and biological substances status: Secondary | ICD-10-CM | POA: Diagnosis not present

## 2022-12-31 DIAGNOSIS — M5416 Radiculopathy, lumbar region: Secondary | ICD-10-CM | POA: Diagnosis not present

## 2022-12-31 DIAGNOSIS — Z885 Allergy status to narcotic agent status: Secondary | ICD-10-CM | POA: Diagnosis not present

## 2022-12-31 DIAGNOSIS — Z471 Aftercare following joint replacement surgery: Secondary | ICD-10-CM | POA: Diagnosis not present

## 2022-12-31 DIAGNOSIS — Z96651 Presence of right artificial knee joint: Secondary | ICD-10-CM | POA: Diagnosis not present

## 2023-01-01 ENCOUNTER — Encounter: Payer: Self-pay | Admitting: Physician Assistant

## 2023-01-01 ENCOUNTER — Ambulatory Visit (INDEPENDENT_AMBULATORY_CARE_PROVIDER_SITE_OTHER): Payer: Medicare HMO | Admitting: Physician Assistant

## 2023-01-01 VITALS — BP 130/81 | HR 83 | Ht 68.0 in | Wt 151.0 lb

## 2023-01-01 DIAGNOSIS — Z131 Encounter for screening for diabetes mellitus: Secondary | ICD-10-CM

## 2023-01-01 DIAGNOSIS — Z1329 Encounter for screening for other suspected endocrine disorder: Secondary | ICD-10-CM

## 2023-01-01 DIAGNOSIS — M797 Fibromyalgia: Secondary | ICD-10-CM | POA: Diagnosis not present

## 2023-01-01 DIAGNOSIS — Z79899 Other long term (current) drug therapy: Secondary | ICD-10-CM

## 2023-01-01 DIAGNOSIS — Z13220 Encounter for screening for lipoid disorders: Secondary | ICD-10-CM

## 2023-01-01 DIAGNOSIS — Z23 Encounter for immunization: Secondary | ICD-10-CM

## 2023-01-01 DIAGNOSIS — S6992XA Unspecified injury of left wrist, hand and finger(s), initial encounter: Secondary | ICD-10-CM | POA: Diagnosis not present

## 2023-01-01 DIAGNOSIS — F411 Generalized anxiety disorder: Secondary | ICD-10-CM

## 2023-01-01 DIAGNOSIS — G35 Multiple sclerosis: Secondary | ICD-10-CM | POA: Diagnosis not present

## 2023-01-01 DIAGNOSIS — F17299 Nicotine dependence, other tobacco product, with unspecified nicotine-induced disorders: Secondary | ICD-10-CM

## 2023-01-01 DIAGNOSIS — Z716 Tobacco abuse counseling: Secondary | ICD-10-CM

## 2023-01-01 MED ORDER — CLONAZEPAM 0.5 MG PO TABS: 0.5000 mg | ORAL_TABLET | Freq: Two times a day (BID) | ORAL | 5 refills | Status: DC

## 2023-01-01 MED ORDER — GABAPENTIN 300 MG PO CAPS: 300.0000 mg | ORAL_CAPSULE | Freq: Three times a day (TID) | ORAL | 3 refills | Status: DC

## 2023-01-01 MED ORDER — VARENICLINE TARTRATE 1 MG PO TABS: 1.0000 mg | ORAL_TABLET | Freq: Two times a day (BID) | ORAL | 3 refills | Status: DC

## 2023-01-01 MED ORDER — METHOCARBAMOL 500 MG PO TABS: 500.0000 mg | ORAL_TABLET | Freq: Three times a day (TID) | ORAL | 1 refills | Status: DC

## 2023-01-01 NOTE — Progress Notes (Incomplete)
Alzheimers died Mom storke

## 2023-01-01 NOTE — Progress Notes (Unsigned)
Established Patient Office Visit  Subjective   Patient ID: Paige Lozano, female    DOB: [birth year]  Age: 57 y.o. MRN: [id number]  Chief Complaint  Patient presents with   Medication Refill   Oleda Guiffre is a 57 y.o. female with COPD, OSA, UC, MS, Bipolar 2 who presents to the clinic for medication refills.    Medication Refill   Needs refills of Robaxin, Gabapentin and Chantix today.   Has been stressed with the recent passing of her parents. It has caused her MS to flare up and she has been following with Neurology.   She is fully healed from her knee replacement. Having some pain from her knee up to her hip that she is starting steroids for today. She is working with orhopedic for management.   She feels her anxiety is well controlled.   Still working on nicotine cessation.  Denies symptoms of dizziness, chest pain, shortness of breath, dyspnea. Reports headaches sometimes believed to be caused by stress.   .. 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/2016   Insomnia 05/29/2016   Ulcerative pancolitis without complication ([place]) [id number]   Bipolar 2 disorder ([place]) 05/29/2016   Nicotine dependence 05/29/2016   Primary osteoarthritis of right knee with recent ACL tear and femoral condylar impaction fracture 06/01/2016   Anxiety state 06/01/2016   Left shoulder pain 06/01/2016   GAD (generalized anxiety disorder) 07/20/2016   History of pulmonary embolus (PE) 07/21/2016   Right elbow pain 07/21/2016   Hot flashes 12/29/2016   Body aches 12/29/2016   Pruritus 12/29/2016   Palpitations 03/08/2017   Chest tightness 03/08/2017   Tachycardia 03/08/2017   Sinus tachycardia 03/12/2017   PAC (premature atrial contraction) 03/12/2017   Lumbar degenerative disc disease 06/02/2017   Plantar fasciitis, bilateral 10/29/2017   Onychomycosis 02/17/2018    Neurodermatitis 03/29/2018   Subcutaneous mass 04/07/2018   Family history of breast cancer 12/02/2018   Acute stress reaction 03/13/2019   Fracture of second and third metatarsals of the right foot 07/14/2019   Left wrist injury 07/31/2019   Thyroid disorder screen 09/04/2019   Actinic keratoses 09/04/2019   Abrasion 11/22/2019   History of lipoma 11/22/2019   Air hunger 11/28/2020   SOB (shortness of breath) 11/28/2020   Right bundle branch block 11/28/2020   Ulcerative colitis ([place])    Sleep apnea    PONV (postoperative nausea and vomiting)    Pulmonary embolism ([place]) 2000   COPD (chronic obstructive pulmonary disease) ([place])    Complication of anesthesia    Bipolar 2 disorder, major depressive episode ([place])    Anxiety    Cervical radiculopathy 12/13/2020   Inflamed sebaceous cyst 02/11/2021   Raynaud's phenomenon without gangrene 08/29/2021   Stress at home 08/29/2021   Pain of left middle finger 11/28/2021   Stenosing tenosynovitis of finger of right hand 11/28/2021   Lipoma of torso 03/02/2022   Injury of left wrist 03/24/2022   Left wrist pain 03/24/2022   Traumatic closed nondisplaced metaphyseal torus fracture of distal radius with routine healing 03/24/2022   Complex tear of triangular fibrocartilage of left wrist 03/24/2022   Resolved Ambulatory Problems    Diagnosis Date Noted   Right lateral epicondylitis 07/21/2016   Sinusitis 11/30/2016   Right wrist injury 12/29/2016   Hand injury, left, subsequent encounter 12/30/2016   Chills (without fever) 03/08/2017   No Additional Past Medical  History    ROS See HPI   Objective:     BP 130/81 (BP Location: Right Arm, Patient Position: Sitting, Cuff Size: Small)   Pulse 83   Ht '5\' 8"'$  (1.727 m)   Wt 151 lb 0.6 oz (68.5 kg)   SpO2 100%   BMI 22.97 kg/m  BP Readings from Last 3 Encounters:  01/01/23 130/81  02/27/22 134/77  11/25/21 121/68   Wt Readings from Last 3 Encounters:  01/01/23 151 lb 0.6 oz  (68.5 kg)  02/27/22 155 lb (70.3 kg)  11/25/21 160 lb (72.6 kg)    ..    01/01/2023    3:29 PM 09/18/2022   10:09 AM 11/25/2021    3:23 PM 09/12/2021    1:14 PM 03/08/2019   10:00 AM  Depression screen PHQ 2/9  Decreased Interest 0 '3 2 2 3  '$ Down, Depressed, Hopeless 0 '3 2 3 3  '$ PHQ - 2 Score 0 '6 4 5 6  '$ Altered sleeping  2 2 0 3  Tired, decreased energy  '3 1 1 3  '$ Change in appetite  '3 1 1 3  '$ Feeling bad or failure about yourself   1 0 1 3  Trouble concentrating  '1 2 1 3  '$ Moving slowly or fidgety/restless  0 0 0 0  Suicidal thoughts  0 0 0 0  PHQ-9 Score  '16 10 9 21  '$ Difficult doing work/chores  Very difficult Somewhat difficult Not difficult at all Very difficult   .Marland Kitchen    11/25/2021    3:24 PM 03/08/2019   10:02 AM  GAD 7 : Generalized Anxiety Score  Nervous, Anxious, on Edge 2 3  Control/stop worrying 2 3  Worry too much - different things 2 3  Trouble relaxing 2 3  Restless 0 3  Easily annoyed or irritable 2 3  Afraid - awful might happen 0 0  Total GAD 7 Score 10 18  Anxiety Difficulty Somewhat difficult Very difficult     Physical Exam Constitutional:      Appearance: Normal appearance.  Cardiovascular:     Rate and Rhythm: Normal rate and regular rhythm.  Pulmonary:     Effort: Pulmonary effort is normal.     Breath sounds: Normal breath sounds.  Neurological:     Mental Status: She is alert and oriented to person, place, and time.  Psychiatric:        Mood and Affect: Mood normal.      Assessment & Plan:  Marland KitchenMarland KitchenTonnette was seen today for medication refill.  Diagnoses and all orders for this visit:  Thyroid disorder screen -     TSH  Lipid screening -     Lipid Panel w/reflex Direct LDL  Diabetes mellitus screening -     COMPLETE METABOLIC PANEL WITH GFR  Medication management -     TSH -     Lipid Panel w/reflex Direct LDL -     COMPLETE METABOLIC PANEL WITH GFR -     CBC with Differential/Platelet  Anxiety state -     clonazePAM (KLONOPIN) 0.5 MG  tablet; Take 1 tablet (0.5 mg total) by mouth in the morning and at bedtime. -     gabapentin (NEURONTIN) 300 MG capsule; Take 1 capsule (300 mg total) by mouth 3 (three) times daily. One tablet three times a day.  Injury of left wrist, initial encounter -     methocarbamol (ROBAXIN) 500 MG tablet; Take 1 tablet (500 mg total) by mouth 3 (three)  times daily.  Encounter for smoking cessation counseling -     varenicline (CHANTIX) 1 MG tablet; Take 1 tablet (1 mg total) by mouth 2 (two) times daily.  Other tobacco product nicotine dependence with nicotine-induced disorder -     varenicline (CHANTIX) 1 MG tablet; Take 1 tablet (1 mg total) by mouth 2 (two) times daily.  Need for shingles vaccine -     Zoster Recombinant (Shingrix )  Multiple sclerosis (HCC) -     gabapentin (NEURONTIN) 300 MG capsule; Take 1 capsule (300 mg total) by mouth 3 (three) times daily. One tablet three times a day.  Fibromyalgia -     varenicline (CHANTIX) 1 MG tablet; Take 1 tablet (1 mg total) by mouth 2 (two) times daily. -     gabapentin (NEURONTIN) 300 MG capsule; Take 1 capsule (300 mg total) by mouth 3 (three) times daily. One tablet three times a day.     Pt received shingles vaccine today Refilled Chantix, Gabapentin, Robaxin  Counseled on smoking cessation, pt will start chantix and work on cessation.  Klonapin refilled for 5 more months.  Fasting labs ordered.   Return in about 6 months (around 07/02/2023).    Iran Planas, PA-C

## 2023-01-04 ENCOUNTER — Encounter: Payer: Self-pay | Admitting: Physician Assistant

## 2023-01-07 ENCOUNTER — Other Ambulatory Visit (HOSPITAL_COMMUNITY): Payer: Self-pay | Admitting: Psychiatry

## 2023-01-07 DIAGNOSIS — M25561 Pain in right knee: Secondary | ICD-10-CM | POA: Diagnosis not present

## 2023-01-07 DIAGNOSIS — M5416 Radiculopathy, lumbar region: Secondary | ICD-10-CM | POA: Diagnosis not present

## 2023-01-07 DIAGNOSIS — Z7409 Other reduced mobility: Secondary | ICD-10-CM | POA: Diagnosis not present

## 2023-01-07 DIAGNOSIS — Z96651 Presence of right artificial knee joint: Secondary | ICD-10-CM | POA: Diagnosis not present

## 2023-01-07 DIAGNOSIS — R531 Weakness: Secondary | ICD-10-CM | POA: Diagnosis not present

## 2023-01-07 DIAGNOSIS — G8929 Other chronic pain: Secondary | ICD-10-CM | POA: Diagnosis not present

## 2023-01-08 DIAGNOSIS — Z7409 Other reduced mobility: Secondary | ICD-10-CM | POA: Insufficient documentation

## 2023-01-08 DIAGNOSIS — G8929 Other chronic pain: Secondary | ICD-10-CM | POA: Insufficient documentation

## 2023-01-08 DIAGNOSIS — R29898 Other symptoms and signs involving the musculoskeletal system: Secondary | ICD-10-CM | POA: Insufficient documentation

## 2023-01-20 DIAGNOSIS — M25532 Pain in left wrist: Secondary | ICD-10-CM | POA: Diagnosis not present

## 2023-01-20 DIAGNOSIS — M24132 Other articular cartilage disorders, left wrist: Secondary | ICD-10-CM | POA: Diagnosis not present

## 2023-01-28 DIAGNOSIS — G35 Multiple sclerosis: Secondary | ICD-10-CM | POA: Diagnosis not present

## 2023-02-15 DIAGNOSIS — Z96651 Presence of right artificial knee joint: Secondary | ICD-10-CM | POA: Diagnosis not present

## 2023-02-15 DIAGNOSIS — M5416 Radiculopathy, lumbar region: Secondary | ICD-10-CM | POA: Diagnosis not present

## 2023-02-19 ENCOUNTER — Encounter: Payer: Self-pay | Admitting: Medical-Surgical

## 2023-02-19 ENCOUNTER — Ambulatory Visit (INDEPENDENT_AMBULATORY_CARE_PROVIDER_SITE_OTHER): Payer: Medicare HMO | Admitting: Medical-Surgical

## 2023-02-19 VITALS — BP 111/64 | HR 94 | Temp 98.2°F | Resp 20 | Ht 68.0 in | Wt 155.2 lb

## 2023-02-19 DIAGNOSIS — J0140 Acute pansinusitis, unspecified: Secondary | ICD-10-CM

## 2023-02-19 DIAGNOSIS — M79675 Pain in left toe(s): Secondary | ICD-10-CM | POA: Diagnosis not present

## 2023-02-19 DIAGNOSIS — J449 Chronic obstructive pulmonary disease, unspecified: Secondary | ICD-10-CM | POA: Diagnosis not present

## 2023-02-19 DIAGNOSIS — R2689 Other abnormalities of gait and mobility: Secondary | ICD-10-CM | POA: Insufficient documentation

## 2023-02-19 DIAGNOSIS — F3181 Bipolar II disorder: Secondary | ICD-10-CM | POA: Diagnosis not present

## 2023-02-19 MED ORDER — FLUCONAZOLE 150 MG PO TABS: 150.0000 mg | ORAL_TABLET | Freq: Once | ORAL | 0 refills | Status: AC

## 2023-02-19 MED ORDER — AMOXICILLIN-POT CLAVULANATE 875-125 MG PO TABS: 1.0000 | ORAL_TABLET | Freq: Two times a day (BID) | ORAL | 0 refills | Status: DC

## 2023-02-19 NOTE — Progress Notes (Signed)
        Established patient visit  History, exam, impression, and plan:  1. Acute non-recurrent pansinusitis Replacement 57 year old female presenting today with complaints of 2 weeks of upper respiratory symptoms including chest congestion, sinus congestion, cough productive of green mucus, shortness of breath, hoarse voice, and facial pain.  Over the last few days, her symptoms have worsened.  Had tried a a dose of Mucinex once but not done any other treatment.  Takes Tylenol regularly.  No fever, chills, chest pain, or GI symptoms.  On exam, notable for frontal and maxillary sinus pressure, posterior oropharyngeal erythema, right axillary lymphadenopathy.  Lungs CTA with even unlabored respirations.  Bilateral TMs normal.  Treating with Augmentin twice daily x 10 days.  Adding Diflucan for VVC prophylaxis.  2. Toe pain, left Has a small area on the inside of her left fifth toe that appears to have a bite of some sort.  The area is red, tender with a centralized 1 mm dark spot.  Unclear when this may have happened or what may have bitten her however the toe has been significantly painful, especially wearing shoes.  No drainage or cellulitic findings.  Recommend using Neosporin/triple antibiotic ointment covered with a Band-Aid for the next few days.  Augmentin as above may help with the symptoms.  If this worsens, return for further evaluation.  Procedures performed this visit: None.  Return if symptoms worsen or fail to improve.  __________________________________ Thayer Ohm, DNP, APRN, FNP-BC Primary Care and Sports Medicine [facility]

## 2023-02-23 DIAGNOSIS — M5416 Radiculopathy, lumbar region: Secondary | ICD-10-CM | POA: Diagnosis not present

## 2023-02-23 DIAGNOSIS — Z96651 Presence of right artificial knee joint: Secondary | ICD-10-CM | POA: Diagnosis not present

## 2023-02-25 ENCOUNTER — Other Ambulatory Visit: Payer: Self-pay | Admitting: *Deleted

## 2023-02-25 ENCOUNTER — Other Ambulatory Visit: Payer: Self-pay | Admitting: Physician Assistant

## 2023-02-25 DIAGNOSIS — R200 Anesthesia of skin: Secondary | ICD-10-CM | POA: Diagnosis not present

## 2023-02-25 DIAGNOSIS — Z131 Encounter for screening for diabetes mellitus: Secondary | ICD-10-CM | POA: Diagnosis not present

## 2023-02-25 DIAGNOSIS — G35 Multiple sclerosis: Secondary | ICD-10-CM | POA: Diagnosis not present

## 2023-02-25 DIAGNOSIS — Z1329 Encounter for screening for other suspected endocrine disorder: Secondary | ICD-10-CM | POA: Diagnosis not present

## 2023-02-25 DIAGNOSIS — R5383 Other fatigue: Secondary | ICD-10-CM | POA: Diagnosis not present

## 2023-02-25 DIAGNOSIS — R7309 Other abnormal glucose: Secondary | ICD-10-CM | POA: Diagnosis not present

## 2023-02-25 DIAGNOSIS — R413 Other amnesia: Secondary | ICD-10-CM | POA: Diagnosis not present

## 2023-02-25 DIAGNOSIS — Z79899 Other long term (current) drug therapy: Secondary | ICD-10-CM | POA: Diagnosis not present

## 2023-02-25 DIAGNOSIS — Z1231 Encounter for screening mammogram for malignant neoplasm of breast: Secondary | ICD-10-CM

## 2023-02-25 DIAGNOSIS — Z13220 Encounter for screening for lipoid disorders: Secondary | ICD-10-CM | POA: Diagnosis not present

## 2023-02-26 NOTE — Progress Notes (Signed)
Vetta,   AST a little elevated. Avoid tylenol and alcohol and recheck in 2 weeks.  Glucose is a little elevated. Will add A1c.  Thyroid looks good.  HDL, good cholesterol looks great.  LDL not to optimal level and TG elevated.   Start fish oil  a day and try to get regular exercise. Follow up in 6 months with labs.

## 2023-03-01 ENCOUNTER — Encounter: Payer: Self-pay | Admitting: Physician Assistant

## 2023-03-01 ENCOUNTER — Other Ambulatory Visit: Payer: Self-pay

## 2023-03-01 DIAGNOSIS — R7989 Other specified abnormal findings of blood chemistry: Secondary | ICD-10-CM

## 2023-03-02 ENCOUNTER — Ambulatory Visit (INDEPENDENT_AMBULATORY_CARE_PROVIDER_SITE_OTHER): Payer: Medicare HMO | Admitting: Sports Medicine

## 2023-03-02 DIAGNOSIS — M65841 Other synovitis and tenosynovitis, right hand: Secondary | ICD-10-CM

## 2023-03-02 LAB — CBC WITH DIFFERENTIAL/PLATELET
Absolute Monocytes: 524 cells/uL (ref 200–950)
Basophils Absolute: 53 cells/uL (ref 0–200)
Basophils Relative: 0.7 %
Eosinophils Absolute: 304 cells/uL (ref 15–500)
Eosinophils Relative: 4 %
HCT: 37.6 % (ref 35.0–45.0)
Hemoglobin: 12.8 g/dL (ref 11.7–15.5)
Lymphs Abs: 3450 cells/uL (ref 850–3900)
MCH: 30.8 pg (ref 27.0–33.0)
MCHC: 34 g/dL (ref 32.0–36.0)
MCV: 90.6 fL (ref 80.0–100.0)
MPV: 10.5 fL (ref 7.5–12.5)
Monocytes Relative: 6.9 %
Neutro Abs: 3268 cells/uL (ref 1500–7800)
Neutrophils Relative %: 43 %
Platelets: 246 10*3/uL (ref 140–400)
RBC: 4.15 10*6/uL (ref 3.80–5.10)
RDW: 14 % (ref 11.0–15.0)
Total Lymphocyte: 45.4 %
WBC: 7.6 10*3/uL (ref 3.8–10.8)

## 2023-03-02 LAB — COMPLETE METABOLIC PANEL WITH GFR
AG Ratio: 2.4 (calc) (ref 1.0–2.5)
ALT: 26 U/L (ref 6–29)
AST: 63 U/L — ABNORMAL HIGH (ref 10–35)
Albumin: 4.5 g/dL (ref 3.6–5.1)
Alkaline phosphatase (APISO): 90 U/L (ref 37–153)
BUN: 13 mg/dL (ref 7–25)
CO2: 26 mmol/L (ref 20–32)
Calcium: 9.4 mg/dL (ref 8.6–10.4)
Chloride: 104 mmol/L (ref 98–110)
Creat: 0.9 mg/dL (ref 0.50–1.03)
Globulin: 1.9 g/dL (calc) (ref 1.9–3.7)
Glucose, Bld: 103 mg/dL — ABNORMAL HIGH (ref 65–99)
Potassium: 4.1 mmol/L (ref 3.5–5.3)
Sodium: 141 mmol/L (ref 135–146)
Total Bilirubin: 0.4 mg/dL (ref 0.2–1.2)
Total Protein: 6.4 g/dL (ref 6.1–8.1)
eGFR: 75 mL/min/{1.73_m2} (ref >=60)

## 2023-03-02 LAB — TSH: TSH: 0.81 mIU/L (ref 0.40–4.50)

## 2023-03-02 LAB — TEST AUTHORIZATION

## 2023-03-02 LAB — HEMOGLOBIN A1C W/OUT EAG: Hgb A1c MFr Bld: 5.5 % of total Hgb (ref <5.7)

## 2023-03-02 LAB — LIPID PANEL W/REFLEX DIRECT LDL
Cholesterol: 220 mg/dL — ABNORMAL HIGH (ref <200)
HDL: 64 mg/dL (ref >=50)
LDL Cholesterol (Calc): 123 mg/dL (calc) — ABNORMAL HIGH
Non-HDL Cholesterol (Calc): 156 mg/dL (calc) — ABNORMAL HIGH (ref <130)
Total CHOL/HDL Ratio: 3.4 (calc) (ref <5.0)
Triglycerides: 213 mg/dL — ABNORMAL HIGH (ref <150)

## 2023-03-02 LAB — STRATIFY JCV AB (W/ INDEX) W/ RFLX
Index Value: 3.15 — ABNORMAL HIGH
Stratify JCV (TM) Ab w/Reflex Inhibition: POSITIVE — AB

## 2023-03-02 MED ORDER — HYDROCODONE-IBUPROFEN 5-200 MG PO TABS
1.0000 | ORAL_TABLET | Freq: Three times a day (TID) | ORAL | 0 refills | Status: DC | PRN
Start: 2023-03-02 — End: 2023-03-04

## 2023-03-02 NOTE — Progress Notes (Signed)
    Procedures performed today:    None.  Independent interpretation of notes and tests performed by another provider:   None.  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  She was also having some triggering.  We did a third flexor tendon sheath injection with ultrasound guidance, she tells me it only lasted for a few weeks, now she has similar pain third and fourth flexor tendon sheaths, I am able to feel a palpable flexor nodule, due to failure conservative treatment I will go ahead and refer her to Dr. Amanda Pea for discussion of operative intervention, we will give her some Vicoprofen to take in the meantime for pain.    ____________________________________________ Ihor Austin. Benjamin Stain, M.D., ABFM., CAQSM., AME. Primary Care and Sports Medicine  [facility]  Adjunct Professor of Family Medicine  [employer] [facility] [employer]  Restaurant manager, fast food

## 2023-03-02 NOTE — Assessment & Plan Note (Signed)
This pleasant 57 year old female returns, I last saw her in January of this year, she was having pain palmar aspect right hand, third digit.  She was also having some triggering.  We did a third flexor tendon sheath injection with ultrasound guidance, she tells me it only lasted for a few weeks, now she has similar pain third and fourth flexor tendon sheaths, I am able to feel a palpable flexor nodule, due to failure conservative treatment I will go ahead and refer her to Dr. Amanda Pea for discussion of operative intervention, we will give her some Vicoprofen to take in the meantime for pain.

## 2023-03-03 ENCOUNTER — Telehealth: Payer: Self-pay | Admitting: Sports Medicine

## 2023-03-03 DIAGNOSIS — M65841 Other synovitis and tenosynovitis, right hand: Secondary | ICD-10-CM

## 2023-03-03 NOTE — Progress Notes (Signed)
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 This test needs to be sent to patient's neurologist or rheumatologist.

## 2023-03-03 NOTE — Telephone Encounter (Signed)
Patient called requesting a refill of ; Hydrocodone -ibuprofen 5-200mg    Pharmacy ;  Please send to [facility] [address] at [facility] & [address] [place] [phone]

## 2023-03-03 NOTE — Progress Notes (Signed)
A1C has resulted and in normal range. No diabetes. No pre-diabetes.

## 2023-03-04 ENCOUNTER — Telehealth: Payer: Self-pay | Admitting: Sports Medicine

## 2023-03-04 DIAGNOSIS — M65841 Other synovitis and tenosynovitis, right hand: Secondary | ICD-10-CM

## 2023-03-04 MED ORDER — HYDROCODONE-IBUPROFEN 7.5-200 MG PO TABS
1.0000 | ORAL_TABLET | Freq: Three times a day (TID) | ORAL | 0 refills | Status: DC | PRN
Start: 2023-03-04 — End: 2023-08-30

## 2023-03-04 MED ORDER — HYDROCODONE-IBUPROFEN 5-200 MG PO TABS
1.0000 | ORAL_TABLET | Freq: Three times a day (TID) | ORAL | 0 refills | Status: DC | PRN
Start: 2023-03-04 — End: 2023-03-04

## 2023-03-04 NOTE — Telephone Encounter (Signed)
I spoke with the pharmacy and they do not have the 5-200 mg. They do have 7.5-200 mg.

## 2023-03-04 NOTE — Telephone Encounter (Signed)
Resent to the new pharmacy.

## 2023-03-04 NOTE — Telephone Encounter (Signed)
Switching to 7.5/200

## 2023-03-04 NOTE — Telephone Encounter (Signed)
>  Patient called requesting a refill of ;  patient called stated the pharmacy on has Hydrocodone-ibuprofen 7.5-325mg   Pharmacy ;  [facility] [address] [place] [facility] [phone]

## 2023-03-04 NOTE — Addendum Note (Signed)
Addended by: Monica Becton on: 03/04/2023 02:11 PM   Modules accepted: Orders

## 2023-03-08 NOTE — Progress Notes (Signed)
Lets see what neurology thinks first and then if they feel that we need to get a rheumatologist involved and they can.

## 2023-03-11 DIAGNOSIS — G35 Multiple sclerosis: Secondary | ICD-10-CM | POA: Diagnosis not present

## 2023-03-18 DIAGNOSIS — M79641 Pain in right hand: Secondary | ICD-10-CM | POA: Diagnosis not present

## 2023-03-18 DIAGNOSIS — M65341 Trigger finger, right ring finger: Secondary | ICD-10-CM | POA: Diagnosis not present

## 2023-03-18 DIAGNOSIS — S63502A Unspecified sprain of left wrist, initial encounter: Secondary | ICD-10-CM | POA: Diagnosis not present

## 2023-03-18 DIAGNOSIS — M65321 Trigger finger, right index finger: Secondary | ICD-10-CM | POA: Diagnosis not present

## 2023-03-19 ENCOUNTER — Telehealth: Payer: Self-pay | Admitting: Physician Assistant

## 2023-03-19 MED ORDER — METHYLPREDNISOLONE 4 MG PO TBPK: ORAL_TABLET | ORAL | 0 refills | Status: DC

## 2023-03-19 NOTE — Telephone Encounter (Signed)
Patient called she was seen on 02-19-23 she said she completed her Agumenting 875-125mg  and still having symptoms she would like something else sent to pharmacy to [facility] at [address] [employer] [place] she is going out of town and request it to be sent over asap

## 2023-03-21 ENCOUNTER — Other Ambulatory Visit (HOSPITAL_COMMUNITY): Payer: Self-pay | Admitting: Psychiatry

## 2023-03-31 ENCOUNTER — Ambulatory Visit: Payer: Medicare HMO

## 2023-04-22 DIAGNOSIS — G35 Multiple sclerosis: Secondary | ICD-10-CM | POA: Diagnosis not present

## 2023-04-28 DIAGNOSIS — R200 Anesthesia of skin: Secondary | ICD-10-CM | POA: Diagnosis not present

## 2023-04-28 DIAGNOSIS — R5383 Other fatigue: Secondary | ICD-10-CM | POA: Diagnosis not present

## 2023-04-28 DIAGNOSIS — G35 Multiple sclerosis: Secondary | ICD-10-CM | POA: Diagnosis not present

## 2023-04-28 DIAGNOSIS — R413 Other amnesia: Secondary | ICD-10-CM | POA: Diagnosis not present

## 2023-05-27 DIAGNOSIS — M65331 Trigger finger, right middle finger: Secondary | ICD-10-CM | POA: Diagnosis not present

## 2023-05-27 DIAGNOSIS — M65341 Trigger finger, right ring finger: Secondary | ICD-10-CM | POA: Diagnosis not present

## 2023-05-27 DIAGNOSIS — Z4789 Encounter for other orthopedic aftercare: Secondary | ICD-10-CM | POA: Diagnosis not present

## 2023-05-27 DIAGNOSIS — M65321 Trigger finger, right index finger: Secondary | ICD-10-CM | POA: Diagnosis not present

## 2023-06-04 DIAGNOSIS — G35 Multiple sclerosis: Secondary | ICD-10-CM | POA: Diagnosis not present

## 2023-06-09 DIAGNOSIS — M25641 Stiffness of right hand, not elsewhere classified: Secondary | ICD-10-CM | POA: Diagnosis not present

## 2023-06-18 ENCOUNTER — Other Ambulatory Visit: Payer: Self-pay | Admitting: *Deleted

## 2023-06-18 DIAGNOSIS — M797 Fibromyalgia: Secondary | ICD-10-CM

## 2023-06-18 DIAGNOSIS — G35 Multiple sclerosis: Secondary | ICD-10-CM

## 2023-06-18 DIAGNOSIS — S6992XA Unspecified injury of left wrist, hand and finger(s), initial encounter: Secondary | ICD-10-CM

## 2023-06-18 DIAGNOSIS — F411 Generalized anxiety disorder: Secondary | ICD-10-CM

## 2023-06-18 MED ORDER — METHOCARBAMOL 500 MG PO TABS
500.0000 mg | ORAL_TABLET | Freq: Three times a day (TID) | ORAL | 3 refills | Status: DC
Start: 2023-06-18 — End: 2023-09-01

## 2023-06-18 MED ORDER — GABAPENTIN 300 MG PO CAPS
300.0000 mg | ORAL_CAPSULE | Freq: Three times a day (TID) | ORAL | 3 refills | Status: DC
Start: 2023-06-18 — End: 2024-04-14

## 2023-06-23 DIAGNOSIS — S63637A Sprain of interphalangeal joint of left little finger, initial encounter: Secondary | ICD-10-CM | POA: Diagnosis not present

## 2023-06-23 DIAGNOSIS — Z4789 Encounter for other orthopedic aftercare: Secondary | ICD-10-CM | POA: Diagnosis not present

## 2023-07-16 DIAGNOSIS — G35 Multiple sclerosis: Secondary | ICD-10-CM | POA: Diagnosis not present

## 2023-08-02 DIAGNOSIS — S63637A Sprain of interphalangeal joint of left little finger, initial encounter: Secondary | ICD-10-CM | POA: Diagnosis not present

## 2023-08-02 DIAGNOSIS — M65331 Trigger finger, right middle finger: Secondary | ICD-10-CM | POA: Diagnosis not present

## 2023-08-02 DIAGNOSIS — M79645 Pain in left finger(s): Secondary | ICD-10-CM | POA: Diagnosis not present

## 2023-08-03 ENCOUNTER — Telehealth (HOSPITAL_COMMUNITY): Payer: Self-pay | Admitting: *Deleted

## 2023-08-03 ENCOUNTER — Telehealth: Payer: Self-pay | Admitting: Physician Assistant

## 2023-08-03 DIAGNOSIS — F411 Generalized anxiety disorder: Secondary | ICD-10-CM

## 2023-08-03 MED ORDER — LAMOTRIGINE 200 MG PO TABS: 200.0000 mg | ORAL_TABLET | Freq: Two times a day (BID) | ORAL | 0 refills | Status: DC

## 2023-08-03 NOTE — Addendum Note (Signed)
Addended by: Thresa Ross on: 08/03/2023 09:58 AM   Modules accepted: Orders

## 2023-08-03 NOTE — Telephone Encounter (Signed)
PATIENT REFILL REQUEST -- 30 DAY SUPPLY-- PATIENT IS GOING TO [place] IN THE MORNING  TO SPREAD HER MOTHER'S ASHES & TO VISIT THE GRAVE OF HER  16 [id number] - [place], [place] - [address] AT [address]   lamoTRIgine (LAMICTAL) 200 MG tablet  -30 DAY SUPPLY

## 2023-08-03 NOTE — Telephone Encounter (Signed)
Patient called requesting a refill of ; clonazePAM (KLONOPIN) 0.5 MG tablet [[id number]]   Patient is going out of town 08/04/23 at 6 am to spread her mother's ashes.  Pharmacy ;   [id number] - [place], [place] - [address] AT [address]

## 2023-08-04 MED ORDER — CLONAZEPAM 0.5 MG PO TABS
0.5000 mg | ORAL_TABLET | Freq: Two times a day (BID) | ORAL | 0 refills | Status: DC
Start: 2023-08-04 — End: 2023-08-31

## 2023-08-12 DIAGNOSIS — S60222D Contusion of left hand, subsequent encounter: Secondary | ICD-10-CM | POA: Diagnosis not present

## 2023-08-13 ENCOUNTER — Encounter (HOSPITAL_COMMUNITY): Payer: Self-pay | Admitting: Psychiatry

## 2023-08-13 ENCOUNTER — Telehealth (INDEPENDENT_AMBULATORY_CARE_PROVIDER_SITE_OTHER): Payer: Medicare HMO | Admitting: Psychiatry

## 2023-08-13 DIAGNOSIS — F3181 Bipolar II disorder: Secondary | ICD-10-CM | POA: Diagnosis not present

## 2023-08-13 DIAGNOSIS — G35 Multiple sclerosis: Secondary | ICD-10-CM

## 2023-08-13 DIAGNOSIS — F411 Generalized anxiety disorder: Secondary | ICD-10-CM | POA: Diagnosis not present

## 2023-08-13 DIAGNOSIS — F5102 Adjustment insomnia: Secondary | ICD-10-CM | POA: Diagnosis not present

## 2023-08-13 DIAGNOSIS — M797 Fibromyalgia: Secondary | ICD-10-CM

## 2023-08-13 MED ORDER — DULOXETINE HCL 30 MG PO CPEP: 30.0000 mg | ORAL_CAPSULE | Freq: Every day | ORAL | 0 refills | Status: DC

## 2023-08-13 NOTE — Progress Notes (Signed)
[facility] Outpatient Follow up visit   Patient Identification: Kamrynn Melott MRN:  [id number] Date of Evaluation:  08/13/2023 Referral Source: Lesly Rubenstein Primary care Chief Complaint:    bipolar follow up , depression Visit Diagnosis:    ICD-10-CM   1. Bipolar 2 disorder (HCC)  F31.81     2. GAD (generalized anxiety disorder)  F41.1     3. Adjustment insomnia  F51.02     4. Anxiety state  F41.1     5. Multiple sclerosis (HCC)  G35     6. Fibromyalgia  M79.7      Virtual Visit via Video Note  I connected with Ople Girgis on 08/13/23 at  1:00 PM EDT by a video enabled telemedicine application and verified that I am speaking with the correct person using two identifiers.  Location: Patient: home Provider: home office   I discussed the limitations of evaluation and management by telemedicine and the availability of in person appointments. The patient expressed understanding and agreed to proceed.     I discussed the assessment and treatment plan with the patient. The patient was provided an opportunity to ask questions and all were answered. The patient agreed with the plan and demonstrated an understanding of the instructions.   The patient was advised to call back or seek an in-person evaluation if the symptoms worsen or if the condition fails to improve as anticipated.  I provided 20 minutes of non-face-to-face time during this encounter.      lHistory of Present Illness:    Last seen more then 16months ago was supposed to follow up in few weeks after last visit. States she has been back and forth from [place] dealing with her parents death and her daughter death 3 years ago. She has before also schedule in emergency instead of keeping regular follow ups which are important to manage unstability  Overall feeling down, subdued, has been helping a person as care giver.  She has still not scheduled therapy. Her circumstances are challenging with finances, residence and grief. I  strongly encouraged her to schedule therapy  Suffers from MS and follows with neurology  States not using THC products any more and understands the risk to judjemnet and meds  Aggravating factor: surgeries . Multiple medical .surgeries, breakups in the past, Grief over parents death Modifying factor:  not that many, few friends   Severity depressed Duration more then 10 years     Previous Psychotropic Medications: Yes   Substance Abuse History in the last 12 months:  Yes.   as per history Marijuana says it is medical for her condition.  Consequences of Substance Abuse: Medical Consequences:  fatigue, poor concenctration  Past Medical History:  Past Medical History:  Diagnosis Date   Anxiety    Bipolar 2 disorder, major depressive episode (HCC)    Complication of anesthesia    COPD (chronic obstructive pulmonary disease) (HCC)    smoker   Fibromyalgia    Insomnia    Multiple sclerosis (HCC)    PONV (postoperative nausea and vomiting)    Pulmonary embolism (HCC) 2000   Sleep apnea    mild OSA no CPAP   Ulcerative colitis (HCC)     Past Surgical History:  Procedure Laterality Date   FOOT SURGERY Left    bone spur   HEMORRHOID SURGERY     HUMERUS FRACTURE SURGERY Left    KNEE SURGERY Right 12/2021   MYOMECTOMY     NASAL SINUS SURGERY     SHOULDER ARTHROSCOPY  WITH SUBACROMIAL DECOMPRESSION AND BICEP TENDON REPAIR Left 07/27/2016   Procedure: SHOULDER ARTHROSCOPY DEBRIDEMENT ROTATOR CUFF AND LABRUM, SUBACROMIAL DECOMPRESSION, BICEPS TENODESIS;  Surgeon: Jones Broom, MD;  Location: [facility];  Service: Orthopedics;  Laterality: Left;  SHOULDER ARTHROSCOPY DEBRIDEMENT ROTATOR CUFF AND LABRUM, SUBACROMIAL DECOMPRESSION, BICEPS TENOTOMY, POSSIBLE TENODESIS   THUMB ARTHROSCOPY     5 surgeries on left thumb, 4 surgeries on right   TONSILLECTOMY AND ADENOIDECTOMY     WRIST FRACTURE SURGERY Left 2023     Family History:  Family History  Adopted: Yes   Problem Relation Age of Onset   Stroke Mother    Alzheimer's disease Father    Breast cancer Sister        Triple Negative    Social History:   Social History   Socioeconomic History   Marital status: Divorced    Spouse name: Not on file   Number of children: 1   Years of education: 16   Highest education level: Bachelor's degree (e.g., BA, AB, BS)  Occupational History   Occupation: Legally disabled  Tobacco Use   Smoking status: Former    Current packs/day: 0.00    Average packs/day: 0.3 packs/day for 29.0 years (7.3 ttl pk-yrs)    Types: Cigarettes    Start date: 07/09/1987    Quit date: 07/08/2016    Years since quitting: 7.1   Smokeless tobacco: Never  Vaping Use   Vaping status: Every Day   Substances: Nicotine  Substance and Sexual Activity   Alcohol use: Yes    Alcohol/week: 0.0 standard drinks of alcohol    Comment: rare    Drug use: Yes    Types: Marijuana    Comment: occassional   Sexual activity: Yes    Partners: Male    Birth control/protection: None  Other Topics Concern   Not on file  Social History Narrative   Lives alone with her two dogs. She had a daughter who has passed away. Likes to work out and enjoys spending time with friends.   Social Determinants of Health   Financial Resource Strain: Medium Risk (09/18/2022)   Overall Financial Resource Strain (CARDIA)    Difficulty of Paying Living Expenses: Somewhat hard  Food Insecurity: Food Insecurity Present (09/18/2022)   Hunger Vital Sign    Worried About Running Out of Food in the Last Year: Sometimes true    Ran Out of Food in the Last Year: Sometimes true  Transportation Needs: No Transportation Needs (09/18/2022)   PRAPARE - Administrator, Civil Service (Medical): No    Lack of Transportation (Non-Medical): No  Physical Activity: Sufficiently Active (09/18/2022)   Exercise Vital Sign    Days of Exercise per Week: 7 days    Minutes of Exercise per Session: 30 min  Stress:  Stress Concern Present (09/18/2022)   [employer] of Occupational Health - Occupational Stress Questionnaire    Feeling of Stress : To some extent  Social Connections: Moderately Isolated (09/18/2022)   Social Connection and Isolation Panel [NHANES]    Frequency of Communication with Friends and Family: More than three times a week    Frequency of Social Gatherings with Friends and Family: More than three times a week    Attends Religious Services: More than 4 times per year    Active Member of [employer] or Organizations: No    Attends Banker Meetings: Never    Marital Status: Divorced     Allergies:   Allergies  Allergen Reactions   Bergera Koenigii Clementeen Graham Tree) Waldemar Dickens Koenigii] Anaphylaxis, Swelling and Hives   Ketamine Anaphylaxis   Other Anaphylaxis, Hives and Swelling    curry curry   Oxycodone Diarrhea and Other (See Comments)   Papaya Enzyme Anaphylaxis   Digestive Enzymes Swelling    Other reaction(s): Throat Swelling   Oxycodone Hcl Hives    Metabolic Disorder Labs: Lab Results  Component Value Date   HGBA1C 5.5 02/25/2023   No results found for: "PROLACTIN" Lab Results  Component Value Date   CHOL 220 (H) 02/25/2023   TRIG 213 (H) 02/25/2023   HDL 64 02/25/2023   CHOLHDL 3.4 02/25/2023   LDLCALC 123 (H) 02/25/2023     Current Medications: Current Outpatient Medications  Medication Sig Dispense Refill   albuterol (VENTOLIN HFA) 108 (90 Base) MCG/ACT inhaler INHALE 2 PUFFS INTO THE LUNGS EVERY 6 HOURS AS NEEDED FOR WHEEZING OR SHORTNESS OF BREATH 6.7 g 0   amoxicillin-clavulanate (AUGMENTIN) 875-125 MG tablet Take 1 tablet by mouth 2 (two) times daily. 20 tablet 0   Cholecalciferol 25 MCG (1000 UT) tablet Take by mouth.     clonazePAM (KLONOPIN) 0.5 MG tablet Take 1 tablet (0.5 mg total) by mouth in the morning and at bedtime. 60 tablet 0   DULoxetine (CYMBALTA) 30 MG capsule Take 1 capsule (30 mg total) by mouth daily. 90 capsule 0    gabapentin (NEURONTIN) 300 MG capsule Take 1 capsule (300 mg total) by mouth 3 (three) times daily. One tablet three times a day. 270 capsule 3   HYDROcodone-ibuprofen (VICOPROFEN) 7.5-200 MG tablet Take 1 tablet by mouth every 8 (eight) hours as needed for moderate pain. 15 tablet 0   lamoTRIgine (LAMICTAL) 200 MG tablet Take 1 tablet (200 mg total) by mouth 2 (two) times daily. 60 tablet 0   methocarbamol (ROBAXIN) 500 MG tablet Take 1 tablet (500 mg total) by mouth 3 (three) times daily. 90 tablet 3   methylPREDNISolone (MEDROL DOSEPAK) 4 MG TBPK tablet Take as directed by package insert. 21 tablet 0   Multiple Vitamin (MULTI-VITAMIN) tablet Take 1 tablet by mouth daily.     natalizumab (TYSABRI) 300 MG/15ML injection Inject into the vein.     QUEtiapine (SEROQUEL) 400 MG tablet TAKE 1 TABLET EVERY DAY (NEED MD APPOINTMENT) 90 tablet 3   sulfaSALAzine (AZULFIDINE) 500 MG tablet Take by mouth.     varenicline (CHANTIX) 1 MG tablet Take 1 tablet (1 mg total) by mouth 2 (two) times daily. 60 tablet 3   vitamin B-12 (CYANOCOBALAMIN) 1000 MCG tablet Take 1,000 mcg by mouth daily.     No current facility-administered medications for this visit.      Psychiatric Specialty Exam: Review of Systems  Cardiovascular:  Negative for chest pain.  Psychiatric/Behavioral:  Positive for depression. Negative for suicidal ideas. The patient is nervous/anxious.     There were no vitals taken for this visit.There is no height or weight on file to calculate BMI.  General Appearance:   Eye Contact:   Speech:  Normal Rate  Volume:  normal  Mood: subdued, stressed  Affect:   Thought Process:  Goal Directed  Orientation:  Full (Time, Place, and Person)  Thought Content:  Logical  Suicidal Thoughts:  No  Homicidal Thoughts:  No  Memory:  Immediate;   Fair Recent;   Fair  Judgement:  Fair  Insight:  Fair  Psychomotor Activity:  Normal  Concentration:  Concentration: Fair and Attention Span: Fair   Recall:  Fair  Fund of Knowledge:Fair  Language: Fair  Akathisia:  Negative  Handed:  Right  AIMS (if indicated):    Assets:  Desire for Improvement  ADL's:  Intact  Cognition: WNL  Sleep:  Fair while on meds    Treatment Plan Summary: Medication management and Plan as follows    Prior documentation reviewed  Bipolar 2: depressed; depressed, subdued and stressed, already on 3 mood stabilizers, she wanst sure if she was using lamital bid, but says probalby is 200mg  bid, no rash Seroquel taking half of 400mg , does not want to increase that No side effects reported  Also on gaba from PCP I would consider cymbalta 30mg  for depression considering she is already on mood stabililzers    GAD: related to family, surgery, MS and finances, highly recommend therapy and compliance with regular follow up for stabilization  Will start cymbalt and eval if tolerable or to increase dose    Insomnia: seroquel helps, continue sleep hygiene  Avoid THC or substance use.    FU 38m or earlier if needed  Patient/Guardian was advised Release of Information must be obtained prior to any record release in order to collaborate their care with an outside provider. Patient/Guardian was advised if they have not already done so to contact the registration department to sign all necessary forms in order for [place] to release information regarding their care.   Consent: Patient/Guardian gives verbal consent for treatment and assignment of benefits for services provided during this visit. Patient/Guardian expressed understanding and agreed to proceed.    Thresa Ross, MD 10/4/20241:13 PM

## 2023-08-19 DIAGNOSIS — G35 Multiple sclerosis: Secondary | ICD-10-CM | POA: Diagnosis not present

## 2023-08-25 DIAGNOSIS — R413 Other amnesia: Secondary | ICD-10-CM | POA: Diagnosis not present

## 2023-08-25 DIAGNOSIS — R5383 Other fatigue: Secondary | ICD-10-CM | POA: Diagnosis not present

## 2023-08-25 DIAGNOSIS — R200 Anesthesia of skin: Secondary | ICD-10-CM | POA: Diagnosis not present

## 2023-08-25 DIAGNOSIS — G35 Multiple sclerosis: Secondary | ICD-10-CM | POA: Diagnosis not present

## 2023-08-30 ENCOUNTER — Other Ambulatory Visit: Payer: Self-pay | Admitting: Physician Assistant

## 2023-08-30 ENCOUNTER — Ambulatory Visit (INDEPENDENT_AMBULATORY_CARE_PROVIDER_SITE_OTHER): Payer: Medicare HMO | Admitting: Physician Assistant

## 2023-08-30 ENCOUNTER — Encounter: Payer: Self-pay | Admitting: Physician Assistant

## 2023-08-30 ENCOUNTER — Ambulatory Visit (INDEPENDENT_AMBULATORY_CARE_PROVIDER_SITE_OTHER): Payer: Medicare HMO

## 2023-08-30 VITALS — BP 135/72 | HR 73 | Ht 68.0 in | Wt 151.0 lb

## 2023-08-30 DIAGNOSIS — R229 Localized swelling, mass and lump, unspecified: Secondary | ICD-10-CM

## 2023-08-30 DIAGNOSIS — R1031 Right lower quadrant pain: Secondary | ICD-10-CM | POA: Diagnosis not present

## 2023-08-30 DIAGNOSIS — Z23 Encounter for immunization: Secondary | ICD-10-CM

## 2023-08-30 DIAGNOSIS — F411 Generalized anxiety disorder: Secondary | ICD-10-CM | POA: Diagnosis not present

## 2023-08-30 DIAGNOSIS — R1900 Intra-abdominal and pelvic swelling, mass and lump, unspecified site: Secondary | ICD-10-CM | POA: Diagnosis not present

## 2023-08-30 DIAGNOSIS — S6992XA Unspecified injury of left wrist, hand and finger(s), initial encounter: Secondary | ICD-10-CM

## 2023-08-30 NOTE — Progress Notes (Unsigned)
   Established Patient Office Visit  Subjective   Patient ID: Paige Lozano, female    DOB: [birth year]  Age: 57 y.o. MRN: [id number]  Chief Complaint  Patient presents with   Medical Management of Chronic Issues    Mood , med refill    HPI  Wednesday nodule not moving bullseye red  Dermatologist in office  2cm by 1cm      No fever chills   Vaping.      Dr. Sheppard Plumber  {History (Optional):23778}  ROS    Objective:     There were no vitals taken for this visit. BP Readings from Last 3 Encounters:  08/30/23 135/72  02/19/23 111/64  01/01/23 130/81   Wt Readings from Last 3 Encounters:  08/30/23 151 lb (68.5 kg)  02/19/23 155 lb 3.2 oz (70.4 kg)  01/01/23 151 lb 0.6 oz (68.5 kg)      Physical Exam   No results found for any visits on 08/30/23.  {Labs (Optional):23779}  The ASCVD Risk score (Arnett DK, et al., 2019) failed to calculate for the following reasons:   Unable to determine if patient is Non-Hispanic African American    Assessment & Plan:   Problem List Items Addressed This Visit   None   No follow-ups on file.    Tandy Gaw, PA-C

## 2023-08-30 NOTE — Progress Notes (Signed)
U/S shows abdominal mass and appears like fat but needs further characterization. CT ordered.

## 2023-08-31 ENCOUNTER — Encounter: Payer: Self-pay | Admitting: Physician Assistant

## 2023-08-31 LAB — CMP14+EGFR
ALT: 13 [IU]/L (ref 0–32)
AST: 16 [IU]/L (ref 0–40)
Albumin: 4.4 g/dL (ref 3.8–4.9)
Alkaline Phosphatase: 99 [IU]/L (ref 44–121)
BUN/Creatinine Ratio: 20 (ref 9–23)
BUN: 18 mg/dL (ref 6–24)
Bilirubin Total: 0.3 mg/dL (ref 0.0–1.2)
CO2: 22 mmol/L (ref 20–29)
Calcium: 9.3 mg/dL (ref 8.7–10.2)
Chloride: 102 mmol/L (ref 96–106)
Creatinine, Ser: 0.89 mg/dL (ref 0.57–1.00)
Globulin, Total: 1.9 g/dL (ref 1.5–4.5)
Glucose: 94 mg/dL (ref 70–99)
Potassium: 4.5 mmol/L (ref 3.5–5.2)
Sodium: 142 mmol/L (ref 134–144)
Total Protein: 6.3 g/dL (ref 6.0–8.5)
eGFR: 76 mL/min/{1.73_m2} (ref >59)

## 2023-08-31 LAB — CBC WITH DIFFERENTIAL/PLATELET
Basophils Absolute: 0 10*3/uL (ref 0.0–0.2)
Basos: 1 %
EOS (ABSOLUTE): 0.4 10*3/uL (ref 0.0–0.4)
Eos: 7 %
Hematocrit: 41.1 % (ref 34.0–46.6)
Hemoglobin: 13.6 g/dL (ref 11.1–15.9)
Immature Grans (Abs): 0 10*3/uL (ref 0.0–0.1)
Immature Granulocytes: 0 %
Lymphocytes Absolute: 2.8 10*3/uL (ref 0.7–3.1)
Lymphs: 45 %
MCH: 30.4 pg (ref 26.6–33.0)
MCHC: 33.1 g/dL (ref 31.5–35.7)
MCV: 92 fL (ref 79–97)
Monocytes Absolute: 0.4 10*3/uL (ref 0.1–0.9)
Monocytes: 6 %
Neutrophils Absolute: 2.6 10*3/uL (ref 1.4–7.0)
Neutrophils: 41 %
Platelets: 173 10*3/uL (ref 150–450)
RBC: 4.47 x10E6/uL (ref 3.77–5.28)
RDW: 12.9 % (ref 11.7–15.4)
WBC: 6.2 10*3/uL (ref 3.4–10.8)

## 2023-08-31 MED ORDER — CLONAZEPAM 0.5 MG PO TABS: 0.5000 mg | ORAL_TABLET | Freq: Two times a day (BID) | ORAL | 5 refills | Status: DC

## 2023-08-31 NOTE — Progress Notes (Signed)
Paige Lozano,   No concerns with labs they look good.  Radiology does want CT scan with contrast to better characterize the masses on ultrasound. It has been ordered! Imaging should be contacting you.

## 2023-09-01 ENCOUNTER — Other Ambulatory Visit (HOSPITAL_COMMUNITY): Payer: Self-pay | Admitting: Psychiatry

## 2023-09-01 DIAGNOSIS — M65331 Trigger finger, right middle finger: Secondary | ICD-10-CM | POA: Diagnosis not present

## 2023-09-01 DIAGNOSIS — S63637A Sprain of interphalangeal joint of left little finger, initial encounter: Secondary | ICD-10-CM | POA: Diagnosis not present

## 2023-09-01 DIAGNOSIS — Z4789 Encounter for other orthopedic aftercare: Secondary | ICD-10-CM | POA: Diagnosis not present

## 2023-09-01 DIAGNOSIS — M65842 Other synovitis and tenosynovitis, left hand: Secondary | ICD-10-CM | POA: Diagnosis not present

## 2023-09-06 ENCOUNTER — Ambulatory Visit: Payer: Medicare HMO

## 2023-09-06 DIAGNOSIS — R229 Localized swelling, mass and lump, unspecified: Secondary | ICD-10-CM | POA: Diagnosis not present

## 2023-09-06 DIAGNOSIS — D259 Leiomyoma of uterus, unspecified: Secondary | ICD-10-CM | POA: Diagnosis not present

## 2023-09-06 DIAGNOSIS — R1900 Intra-abdominal and pelvic swelling, mass and lump, unspecified site: Secondary | ICD-10-CM | POA: Diagnosis not present

## 2023-09-06 DIAGNOSIS — N858 Other specified noninflammatory disorders of uterus: Secondary | ICD-10-CM | POA: Diagnosis not present

## 2023-09-06 MED ORDER — IOHEXOL 300 MG/ML  SOLN
100.0000 mL | Freq: Once | INTRAMUSCULAR | Status: AC | PRN
  Administered 2023-09-06: 100 mL via INTRAVENOUS

## 2023-09-06 MED ORDER — IOHEXOL 9 MG/ML PO SOLN: 500.0000 mL | ORAL | Status: AC

## 2023-09-06 NOTE — Progress Notes (Signed)
Fat necrosis-benign finding and occur due to injury or trauma but usually go away on their own. GREAT news.   Small uterine fibroids.

## 2023-09-15 ENCOUNTER — Other Ambulatory Visit (HOSPITAL_COMMUNITY): Payer: Self-pay | Admitting: Psychiatry

## 2023-09-20 ENCOUNTER — Telehealth (HOSPITAL_COMMUNITY): Payer: Self-pay | Admitting: *Deleted

## 2023-09-20 MED ORDER — LAMOTRIGINE 200 MG PO TABS: 200.0000 mg | ORAL_TABLET | Freq: Two times a day (BID) | ORAL | 0 refills | Status: DC

## 2023-09-20 NOTE — Addendum Note (Signed)
Addended by: Thresa Ross on: 09/20/2023 11:10 AM   Modules accepted: Orders

## 2023-09-20 NOTE — Telephone Encounter (Signed)
Patient Refill Request  lamoTRIgine (LAMICTAL) 200 MG tablet  [id number] - [place], [place] - [address]   Next Appt 09/24/23

## 2023-09-21 ENCOUNTER — Telehealth: Payer: Self-pay | Admitting: *Deleted

## 2023-09-21 ENCOUNTER — Ambulatory Visit (INDEPENDENT_AMBULATORY_CARE_PROVIDER_SITE_OTHER): Payer: Medicare HMO | Admitting: Physician Assistant

## 2023-09-21 DIAGNOSIS — Z0000 Encounter for general adult medical examination without abnormal findings: Secondary | ICD-10-CM

## 2023-09-21 NOTE — Patient Instructions (Addendum)
MEDICARE ANNUAL WELLNESS VISIT Health Maintenance Summary and Written Plan of Care  [name] ,  Thank you for allowing me to perform your Medicare Annual Wellness Visit and for your ongoing commitment to your health.   Health Maintenance & Immunization History Health Maintenance  Topic Date Due   MAMMOGRAM  12/08/2020   COVID-19 Vaccine (5 - 2023-24 season) 10/07/2023 (Originally 07/11/2023)   Cervical Cancer Screening (HPV/Pap Cotest)  09/20/2024 (Originally 10/05/1996)   DEXA SCAN  09/20/2024 (Originally [birth year])   Hepatitis C Screening  09/20/2024 (Originally 10/05/1984)   HIV Screening  09/20/2024 (Originally 10/05/1981)   Medicare Annual Wellness (AWV)  09/20/2024   DTaP/Tdap/Td (4 - Td or Tdap) 10/11/2030   Colonoscopy  07/03/2031   INFLUENZA VACCINE  Completed   Zoster Vaccines- Shingrix  Completed   HPV VACCINES  Aged Out   Immunization History  Administered Date(s) Administered   Influenza, Seasonal, Injecte, Preservative Fre 08/30/2023   Influenza,inj,Quad PF,6+ Mos 12/06/2018   Influenza,inj,quad, With Preservative 08/20/2009   Influenza-Unspecified 07/10/2016, 10/12/2022   Moderna Covid-19 Fall Seasonal Vaccine 25yrs & older 10/12/2022   Moderna SARS-COV2 Booster Vaccination 10/25/2021   Moderna Sars-Covid-2 Vaccination 02/17/2020, 03/16/2020, 06/09/2020   Pneumococcal Polysaccharide-23 12/17/2015   Tdap 05/25/2011, 08/04/2018, 10/11/2020   Zoster Recombinant(Shingrix) 01/01/2023, 08/30/2023    These are the patient goals that we discussed:  Goals Addressed               This Visit's Progress     Patient Stated (pt-stated)        Patient stated that she would like to get back in the gym.         This is a list of Health Maintenance Items that are overdue or due now: Screening mammography Screening Pap smear and pelvic exam  Bone densitometry screening   Orders/Referrals Placed Today: Orders Placed This Encounter  Procedures   AMB  Referral VBCI Care Management    Referral Priority:   Routine    Referral Type:   Consultation    Referral Reason:   Care Coordination    Number of Visits Requested:   1   (Contact our referral department at [phone] if you have not spoken with someone about your referral appointment within the next 5 days)    Follow-up Plan Follow-up with Jomarie Longs, PA-C as planned Medicare wellness visit in one year.  Patient will access AVS on my chart.      Health Maintenance, Female Adopting a healthy lifestyle and getting preventive care are important in promoting health and wellness. Ask your health care provider about: The right schedule for you to have regular tests and exams. Things you can do on your own to prevent diseases and keep yourself healthy. What should I know about diet, weight, and exercise? Eat a healthy diet  Eat a diet that includes plenty of vegetables, fruits, low-fat dairy products, and lean protein. Do not eat a lot of foods that are high in solid fats, added sugars, or sodium. Maintain a healthy weight Body mass index (BMI) is used to identify weight problems. It estimates body fat based on height and weight. Your health care provider can help determine your BMI and help you achieve or maintain a healthy weight. Get regular exercise Get regular exercise. This is one of the most important things you can do for your health. Most adults should: Exercise for at least 150 minutes each week. The exercise should increase your heart rate and make you sweat (moderate-intensity  exercise). Do strengthening exercises at least twice a week. This is in addition to the moderate-intensity exercise. Spend less time sitting. Even light physical activity can be beneficial. Watch cholesterol and blood lipids Have your blood tested for lipids and cholesterol at 57 years of age, then have this test every 5 years. Have your cholesterol levels checked more often if: Your lipid or  cholesterol levels are high. You are older than 58 years of age. You are at high risk for heart disease. What should I know about cancer screening? Depending on your health history and family history, you may need to have cancer screening at various ages. This may include screening for: Breast cancer. Cervical cancer. Colorectal cancer. Skin cancer. Lung cancer. What should I know about heart disease, diabetes, and high blood pressure? Blood pressure and heart disease High blood pressure causes heart disease and increases the risk of stroke. This is more likely to develop in people who have high blood pressure readings or are overweight. Have your blood pressure checked: Every 3-5 years if you are 59-24 years of age. Every year if you are 88 years old or older. Diabetes Have regular diabetes screenings. This checks your fasting blood sugar level. Have the screening done: Once every three years after age 66 if you are at a normal weight and have a low risk for diabetes. More often and at a younger age if you are overweight or have a high risk for diabetes. What should I know about preventing infection? Hepatitis B If you have a higher risk for hepatitis B, you should be screened for this virus. Talk with your health care provider to find out if you are at risk for hepatitis B infection. Hepatitis C Testing is recommended for: Everyone born from [birth year] through 1965. Anyone with known risk factors for hepatitis C. Sexually transmitted infections (STIs) Get screened for STIs, including gonorrhea and chlamydia, if: You are sexually active and are younger than 57 years of age. You are older than 57 years of age and your health care provider tells you that you are at risk for this type of infection. Your sexual activity has changed since you were last screened, and you are at increased risk for chlamydia or gonorrhea. Ask your health care provider if you are at risk. Ask your health care  provider about whether you are at high risk for HIV. Your health care provider may recommend a prescription medicine to help prevent HIV infection. If you choose to take medicine to prevent HIV, you should first get tested for HIV. You should then be tested every 3 months for as long as you are taking the medicine. Pregnancy If you are about to stop having your period (premenopausal) and you may become pregnant, seek counseling before you get pregnant. Take 400 to 800 micrograms (mcg) of folic acid every day if you become pregnant. Ask for birth control (contraception) if you want to prevent pregnancy. Osteoporosis and menopause Osteoporosis is a disease in which the bones lose minerals and strength with aging. This can result in bone fractures. If you are 71 years old or older, or if you are at risk for osteoporosis and fractures, ask your health care provider if you should: Be screened for bone loss. Take a calcium or vitamin D supplement to lower your risk of fractures. Be given hormone replacement therapy (HRT) to treat symptoms of menopause. Follow these instructions at home: Alcohol use Do not drink alcohol if: Your health care provider tells you  not to drink. You are pregnant, may be pregnant, or are planning to become pregnant. If you drink alcohol: Limit how much you have to: 0-1 drink a day. Know how much alcohol is in your drink. In the [place] one drink equals one 12 oz bottle of beer (355 mL), one 5 oz glass of wine (148 mL), or one 1 oz glass of hard liquor (44 mL). Lifestyle Do not use any products that contain nicotine or tobacco. These products include cigarettes, chewing tobacco, and vaping devices, such as e-cigarettes. If you need help quitting, ask your health care provider. Do not use street drugs. Do not share needles. Ask your health care provider for help if you need support or information about quitting drugs. General instructions Schedule regular health, dental, and  eye exams. Stay current with your vaccines. Tell your health care provider if: You often feel depressed. You have ever been abused or do not feel safe at home. Summary Adopting a healthy lifestyle and getting preventive care are important in promoting health and wellness. Follow your health care provider's instructions about healthy diet, exercising, and getting tested or screened for diseases. Follow your health care provider's instructions on monitoring your cholesterol and blood pressure. This information is not intended to replace advice given to you by your health care provider. Make sure you discuss any questions you have with your health care provider. Document Revised: 03/17/2021 Document Reviewed: 03/17/2021 Elsevier Patient Education  2024 [employer].

## 2023-09-21 NOTE — Progress Notes (Signed)
  Care Coordination   Note   09/21/2023 Name: [name] MRN: [id number] DOB: 10-May-1966  Paige Lozano is a 57 y.o. year old female who sees [place], Lonna [place], [place] for primary care. I reached out to [name] by phone today to offer care coordination services.  [name] was given information about Care Coordination services today including:   The Care Coordination services include support from the care team which includes your Nurse Coordinator, Clinical Social Worker, or Pharmacist.  The Care Coordination team is here to help remove barriers to the health concerns and goals most important to you. Care Coordination services are voluntary, and the patient may decline or stop services at any time by request to their care team member.   Care Coordination Consent Status: Patient agreed to services and verbal consent obtained.   Follow up plan:  Telephone appointment with care coordination team member scheduled for:  09/29/2023  Encounter Outcome:  Patient Scheduled from referral   Burman Nieves, [facility] Care Coordination Care Guide Direct Dial: [phone]

## 2023-09-21 NOTE — Progress Notes (Signed)
MEDICARE ANNUAL WELLNESS VISIT  09/21/2023  Telephone Visit Disclaimer This Medicare AWV was conducted by telephone due to national recommendations for restrictions regarding the COVID-19 Pandemic (e.g. social distancing).  I verified, using two identifiers, that I am speaking with Paige Lozano or their authorized healthcare agent. I discussed the limitations, risks, security, and privacy concerns of performing an evaluation and management service by telephone and the potential availability of an in-person appointment in the future. The patient expressed understanding and agreed to proceed.  Location of Patient: Home Location of Provider (nurse):  In the office.  Subjective:    Paige Lozano is a 57 y.o. female patient of Caleen Essex, Lonna Cobb, PA-C who had a Medicare Annual Wellness Visit today via telephone. Paige Lozano is Legally disabled and lives alone. she had one child but she has passed away. she reports that she is socially active and does interact with friends/family regularly. she is moderately physically active and enjoys working out and spending time with friends.  Patient Care Team: Nolene Ebbs as PCP - General (Family Medicine) Baldo Daub, MD as PCP - Cardiology (Cardiology)     09/21/2023   10:13 AM 09/18/2022   10:13 AM 09/12/2021    1:11 PM 08/11/2016   11:14 AM 07/27/2016   12:16 PM 07/22/2016   10:12 AM 05/29/2016    4:03 PM  Advanced Directives  Does Patient Have a Medical Advance Directive? No No No Yes No No Yes  Type of Advance Directive    Living will   Living will;Out of facility DNR (pink MOST or yellow form)  Copy of Healthcare Power of Attorney in Chart?    No - copy requested     Would patient like information on creating a medical advance directive? No - Patient declined No - Patient declined No - Patient declined  No - patient declined information      Hospital Utilization Over the Past 12 Months: # of hospitalizations or ER visits: 0 # of  surgeries: 2  Review of Systems    Patient reports that her overall health is worse compared to last year.  History obtained from chart review and the patient  Patient Reported Readings (BP, Pulse, CBG, Weight, etc) none Per patient no change in vitals since last visit, unable to obtain new vitals due to telehealth visit  Pain Assessment Pain : 0-10 Pain Score: 8  Pain Type: Chronic pain Pain Location: Generalized Pain Descriptors / Indicators: Aching Pain Onset: More than a month ago Pain Frequency: Constant Pain Relieving Factors: Ibuprofen  Pain Relieving Factors: Ibuprofen  Current Medications & Allergies (verified) Allergies as of 09/21/2023       Reactions   Bergera Koenigii (curry Tree) Waldemar Dickens Koenigii] Anaphylaxis, Swelling, Hives   Ketamine Anaphylaxis   Other Anaphylaxis, Hives, Swelling   curry curry   Oxycodone Diarrhea, Other (See Comments)   Papaya Enzyme Anaphylaxis   Digestive Enzymes Swelling   Other reaction(s): Throat Swelling   Oxycodone Hcl Hives        Medication List        Accurate as of September 21, 2023 10:40 AM. If you have any questions, ask your nurse or doctor.          albuterol 108 (90 Base) MCG/ACT inhaler Commonly known as: VENTOLIN HFA INHALE 2 PUFFS INTO THE LUNGS EVERY 6 HOURS AS NEEDED FOR WHEEZING OR SHORTNESS OF BREATH   amphetamine-dextroamphetamine 5 MG tablet Commonly known as: ADDERALL Take 1 tablet by mouth  every morning.   Cholecalciferol 25 MCG (1000 UT) tablet Take by mouth.   clonazePAM 0.5 MG tablet Commonly known as: KLONOPIN Take 1 tablet (0.5 mg total) by mouth in the morning and at bedtime.   cyanocobalamin 1000 MCG tablet Commonly known as: VITAMIN B12 Take 1,000 mcg by mouth daily.   DULoxetine 30 MG capsule Commonly known as: CYMBALTA Take 1 capsule (30 mg total) by mouth daily.   gabapentin 300 MG capsule Commonly known as: NEURONTIN Take 1 capsule (300 mg total) by mouth 3 (three)  times daily. One tablet three times a day.   lamoTRIgine 200 MG tablet Commonly known as: LAMICTAL Take 1 tablet (200 mg total) by mouth 2 (two) times daily.   methocarbamol 500 MG tablet Commonly known as: ROBAXIN TAKE 1 TABLET THREE TIMES DAILY   Multi-Vitamin tablet Take 1 tablet by mouth daily.   QUEtiapine 400 MG tablet Commonly known as: SEROQUEL TAKE 1 TABLET EVERY DAY (NEED MD APPOINTMENT)   sulfaSALAzine 500 MG tablet Commonly known as: AZULFIDINE Take by mouth.   Tysabri 300 MG/15ML injection Generic drug: natalizumab Inject into the vein.        History (reviewed): Past Medical History:  Diagnosis Date   Anxiety    Bipolar 2 disorder, major depressive episode (HCC)    Complication of anesthesia    COPD (chronic obstructive pulmonary disease) (HCC)    smoker   Fibromyalgia    Insomnia    Multiple sclerosis (HCC)    PONV (postoperative nausea and vomiting)    Pulmonary embolism (HCC) 2000   Sleep apnea    mild OSA no CPAP   Ulcerative colitis (HCC)    Past Surgical History:  Procedure Laterality Date   FOOT SURGERY Left    bone spur   HEMORRHOID SURGERY     HUMERUS FRACTURE SURGERY Left    KNEE SURGERY Right 12/2021   MYOMECTOMY     NASAL SINUS SURGERY     SHOULDER ARTHROSCOPY WITH SUBACROMIAL DECOMPRESSION AND BICEP TENDON REPAIR Left 07/27/2016   Procedure: SHOULDER ARTHROSCOPY DEBRIDEMENT ROTATOR CUFF AND LABRUM, SUBACROMIAL DECOMPRESSION, BICEPS TENODESIS;  Surgeon: Jones Broom, MD;  Location: [facility];  Service: Orthopedics;  Laterality: Left;  SHOULDER ARTHROSCOPY DEBRIDEMENT ROTATOR CUFF AND LABRUM, SUBACROMIAL DECOMPRESSION, BICEPS TENOTOMY, POSSIBLE TENODESIS   THUMB ARTHROSCOPY     5 surgeries on left thumb, 4 surgeries on right   TONSILLECTOMY AND ADENOIDECTOMY     WRIST FRACTURE SURGERY Left 2023   Family History  Adopted: Yes  Problem Relation Age of Onset   Stroke Mother    Alzheimer's disease Father     Breast cancer Sister        Triple Negative   Social History   Socioeconomic History   Marital status: Divorced    Spouse name: Not on file   Number of children: 1   Years of education: 16   Highest education level: Bachelor's degree (e.g., BA, AB, BS)  Occupational History   Occupation: Legally disabled  Tobacco Use   Smoking status: Former    Current packs/day: 0.00    Average packs/day: 0.3 packs/day for 29.0 years (7.3 ttl pk-yrs)    Types: Cigarettes    Start date: 07/09/1987    Quit date: 07/08/2016    Years since quitting: 7.2   Smokeless tobacco: Never  Vaping Use   Vaping status: Every Day   Substances: Nicotine  Substance and Sexual Activity   Alcohol use: Yes    Alcohol/week: 0.0  standard drinks of alcohol    Comment: rare    Drug use: Yes    Types: Marijuana    Comment: occassional   Sexual activity: Yes    Partners: Male    Birth control/protection: None  Other Topics Concern   Not on file  Social History Narrative   Lives alone with her two dogs. She had a daughter who has passed away. Likes to work out and enjoys spending time with friends.   Social Determinants of Health   Financial Resource Strain: Medium Risk (09/21/2023)   Overall Financial Resource Strain (CARDIA)    Difficulty of Paying Living Expenses: Somewhat hard  Food Insecurity: Food Insecurity Present (09/21/2023)   Hunger Vital Sign    Worried About Running Out of Food in the Last Year: Sometimes true    Ran Out of Food in the Last Year: Sometimes true  Transportation Needs: No Transportation Needs (09/21/2023)   PRAPARE - Administrator, Civil Service (Medical): No    Lack of Transportation (Non-Medical): No  Physical Activity: Insufficiently Active (09/21/2023)   Exercise Vital Sign    Days of Exercise per Week: 3 days    Minutes of Exercise per Session: 40 min  Stress: No Stress Concern Present (09/21/2023)   [employer] of Occupational Health - Occupational  Stress Questionnaire    Feeling of Stress : Not at all  Social Connections: Moderately Isolated (09/21/2023)   Social Connection and Isolation Panel [NHANES]    Frequency of Communication with Friends and Family: More than three times a week    Frequency of Social Gatherings with Friends and Family: More than three times a week    Attends Religious Services: More than 4 times per year    Active Member of [employer] or Organizations: No    Attends Banker Meetings: Never    Marital Status: Divorced    Activities of Daily Living    09/21/2023   10:24 AM  In your present state of health, do you have any difficulty performing the following activities:  Hearing? 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  Vision? 0  Difficulty concentrating or making decisions? 1  Walking or climbing stairs? 1  Comment when in exacerbation  Dressing or bathing? 0  Doing errands, shopping? 0  Preparing Food and eating ? N  Using the Toilet? N  In the past six months, have you accidently leaked urine? N  Do you have problems with loss of bowel control? N  Managing your Medications? N  Managing your Finances? N  Housekeeping or managing your Housekeeping? N    Patient Education/ Literacy How often do you need to have someone help you when you read instructions, pamphlets, or other written materials from your doctor or pharmacy?: 1 - Never What is the last grade level you completed in school?: Bachelor's degree  Exercise    Diet Patient reports consuming 1 meals a day and 0 snack(s) a day Patient reports that her primary diet is: Regular Patient reports that she does have regular access to food.   Depression Screen    09/21/2023   10:15 AM 08/30/2023   11:33 AM 01/01/2023    3:29 PM 09/18/2022   10:09 AM 11/25/2021    3:23 PM 09/12/2021    1:14 PM 03/08/2019   10:00 AM  PHQ 2/9 Scores  PHQ - 2 Score 0 6 0 6 4 5 6   PHQ- 9  Score  13  16  10 9 21      Fall Risk    09/21/2023   10:15 AM 01/01/2023    3:29 PM 09/18/2022   10:14 AM 09/12/2021    1:14 PM  Fall Risk   Falls in the past year? 1 0 1 1  Number falls in past yr: 1 0 1 1  Injury with Fall? 1 0 1 1  Risk for fall due to : History of fall(s) No Fall Risks History of fall(s);Impaired balance/gait History of fall(s);Impaired balance/gait  Follow up Falls evaluation completed;Education provided;Falls prevention discussed Falls evaluation completed Falls evaluation completed;Education provided;Falls prevention discussed Falls evaluation completed;Education provided;Falls prevention discussed     Objective:  Paige Lozano seemed alert and oriented and she participated appropriately during our telephone visit.  Blood Pressure Weight BMI  BP Readings from Last 3 Encounters:  08/30/23 135/72  02/19/23 111/64  01/01/23 130/81   Wt Readings from Last 3 Encounters:  08/30/23 151 lb (68.5 kg)  02/19/23 155 lb 3.2 oz (70.4 kg)  01/01/23 151 lb 0.6 oz (68.5 kg)   BMI Readings from Last 1 Encounters:  08/30/23 22.96 kg/m    *Unable to obtain current vital signs, weight, and BMI due to telephone visit type  Hearing/Vision  Paige Lozano did not seem to have difficulty with hearing/understanding during the telephone conversation Reports that she has not had a formal eye exam by an eye care professional within the past year Reports that she has not had a formal hearing evaluation within the past year *Unable to fully assess hearing and vision during telephone visit type  Cognitive Function:    09/21/2023   10:31 AM 09/18/2022   10:36 AM 09/12/2021    1:47 PM  6CIT Screen  What Year? 0 points 0 points 0 points  What month? 0 points 0 points 0 points  What time? 0 points 0 points 0 points  Count back from 20 0 points 4 points 2 points  Months in reverse 0 points 0 points 0 points  Repeat phrase 2 points 2 points 2 points  Total Score 2 points 6 points 4 points    (Normal:0-7, Significant for Dysfunction: >8)  Normal Cognitive Function Screening: Yes   Immunization & Health Maintenance Record Immunization History  Administered Date(s) Administered   Influenza, Seasonal, Injecte, Preservative Fre 08/30/2023   Influenza,inj,Quad PF,6+ Mos 12/06/2018   Influenza,inj,quad, With Preservative 08/20/2009   Influenza-Unspecified 07/10/2016, 10/12/2022   Moderna Covid-19 Fall Seasonal Vaccine 38yrs & older 10/12/2022   Moderna SARS-COV2 Booster Vaccination 10/25/2021   Moderna Sars-Covid-2 Vaccination 02/17/2020, 03/16/2020, 06/09/2020   Pneumococcal Polysaccharide-23 12/17/2015   Tdap 05/25/2011, 08/04/2018, 10/11/2020   Zoster Recombinant(Shingrix) 01/01/2023, 08/30/2023    Health Maintenance  Topic Date Due   MAMMOGRAM  12/08/2020   COVID-19 Vaccine (5 - 2023-24 season) 10/07/2023 (Originally 07/11/2023)   Cervical Cancer Screening (HPV/Pap Cotest)  09/20/2024 (Originally 10/05/1996)   DEXA SCAN  09/20/2024 (Originally 1966-03-31)   Hepatitis C Screening  09/20/2024 (Originally 10/05/1984)   HIV Screening  09/20/2024 (Originally 10/05/1981)   Medicare Annual Wellness (AWV)  09/20/2024   DTaP/Tdap/Td (4 - Td or Tdap) 10/11/2030   Colonoscopy  07/03/2031   INFLUENZA VACCINE  Completed   Zoster Vaccines- Shingrix  Completed   HPV VACCINES  Aged Out       Assessment  This is a routine wellness examination for [employer].  Health Maintenance: Due or Overdue Health Maintenance Due  Topic Date Due   MAMMOGRAM  12/08/2020    Paige Lozano needs a referral for Community Assistance: Care Management:   yes Social Work:    yes Prescription Assistance:  no Nutrition/Diabetes Education:  no   Plan:  Personalized Goals  Goals Addressed               This Visit's Progress     Patient Stated (pt-stated)        Patient stated that she would like to get back in the gym.       Personalized Health Maintenance & Screening  Recommendations  Screening mammography Screening Pap smear and pelvic exam  Bone densitometry screening  Lung Cancer Screening Recommended: no (Low Dose CT Chest recommended if Age 41-80 years, 20 pack-year currently smoking OR have quit w/in past 15 years) Hepatitis C Screening recommended: yes HIV Screening recommended: yes  Advanced Directives: Written information was not prepared per patient's request.  Referrals & Orders Orders Placed This Encounter  Procedures   AMB Referral [facility] Care Management    Follow-up Plan Follow-up with Jomarie Longs, PA-C as planned Medicare wellness visit in one year.  Patient will access AVS on my chart.   I have personally reviewed and noted the following in the patient's chart:   Medical and social history Use of alcohol, tobacco or illicit drugs  Current medications and supplements Functional ability and status Nutritional status Physical activity Advanced directives List of other physicians Hospitalizations, surgeries, and ER visits in previous 12 months Vitals Screenings to include cognitive, depression, and falls Referrals and appointments  In addition, I have reviewed and discussed with Paige Lozano certain preventive protocols, quality metrics, and best practice recommendations. A written personalized care plan for preventive services as well as general preventive health recommendations is available and can be mailed to the patient at her request.      Modesto Charon, RN BSN  09/21/2023

## 2023-09-22 ENCOUNTER — Other Ambulatory Visit: Payer: Self-pay

## 2023-09-22 DIAGNOSIS — Z13820 Encounter for screening for osteoporosis: Secondary | ICD-10-CM

## 2023-09-22 DIAGNOSIS — Z780 Asymptomatic menopausal state: Secondary | ICD-10-CM

## 2023-09-22 NOTE — Progress Notes (Signed)
Ordered dexa scan.  

## 2023-09-23 DIAGNOSIS — G35 Multiple sclerosis: Secondary | ICD-10-CM | POA: Diagnosis not present

## 2023-09-24 ENCOUNTER — Telehealth (HOSPITAL_COMMUNITY): Payer: Medicare HMO | Admitting: Psychiatry

## 2023-09-24 ENCOUNTER — Encounter (HOSPITAL_COMMUNITY): Payer: Self-pay

## 2023-09-24 ENCOUNTER — Telehealth (HOSPITAL_COMMUNITY): Payer: Self-pay | Admitting: Psychiatry

## 2023-09-24 NOTE — Telephone Encounter (Signed)
Error. Patient has refills on file.

## 2023-09-28 ENCOUNTER — Ambulatory Visit: Payer: Medicare HMO | Admitting: Physician Assistant

## 2023-09-29 ENCOUNTER — Ambulatory Visit: Payer: Self-pay

## 2023-09-29 NOTE — Patient Instructions (Signed)
Visit Information  Thank you for taking time to visit with me today. Please don't hesitate to contact me if I can be of assistance to you.   Following are the goals we discussed today:  Patient agreed to eliminate and reduce some expenses. Patient agreed to apply for Medicaid to cover medical bills. SW agreed to forward patient food resources. SW agreed to forward patient medicaid application.   Our next appointment is by telephone on 10/13/23 at 2pm  Please call the care guide team at [phone] if you need to cancel or reschedule your appointment.   If you are experiencing a Mental Health or Behavioral Health Crisis or need someone to talk to, please call 911  Patient verbalizes understanding of instructions and care plan provided today and agrees to view in MyChart. Active MyChart status and patient understanding of how to access instructions and care plan via MyChart confirmed with patient.     Telephone follow up appointment with care management team member scheduled for: 10/13/23 at 2pm  Lysle Morales, BSW Social Worker [phone]

## 2023-09-29 NOTE — Patient Outreach (Signed)
  Care Coordination   Initial Visit Note   09/29/2023 Name: Paige Lozano MRN: [id number] DOB: 1966-07-20  Paige Lozano is a 57 y.o. year old female who sees [place], Lonna Cobb, [place] for primary care. I spoke with  [name] by phone today.  What matters to the patients health and wellness today?  Patient request resources to assist with financial strain. Patients bills exceed her income [phone]).  There are other bills that prevent patient from having sufficient resources for daily expenses. SW does suggest Equal Payment plan with [employer] to manage power bill.  SW recommends eliminating Internet (except when patient is working a part time job that requires internet), [employer], apps, and storage unit.     Goals Addressed             This Visit's Progress    Care Coordination Activities       Interventions Today    Flowsheet Row Most Recent Value  Chronic Disease   Chronic disease during today's visit Chronic Obstructive Pulmonary Disease (COPD)  General Interventions   General Interventions Discussed/Reviewed General Interventions Discussed, General Interventions Reviewed, [employer] has financial stress. SW reviewed bills and pt is over budget and in an ongoing crisis. Pt agreed to apply for Medicaid due to large bills. Pt agreed to adjust some bill. SW will email a list of food banks and medicaid application.]  Education Interventions   Education Provided Provided Education  [SW educated patient on Medicaid and Foodstamps. Pt feels she may have enough medicaid bills to submit to Medicaid. Pt has received very little from foodstamps in the past and is not interested at this time with reapplying. SW provided budget counseling.]              SDOH assessments and interventions completed:  Yes     Care Coordination Interventions:  Yes, provided   Follow up plan: Follow up call scheduled for 10/13/23 at 2pm    Encounter Outcome:  Patient Visit  Completed

## 2023-09-30 DIAGNOSIS — S63637A Sprain of interphalangeal joint of left little finger, initial encounter: Secondary | ICD-10-CM | POA: Diagnosis not present

## 2023-09-30 DIAGNOSIS — M65842 Other synovitis and tenosynovitis, left hand: Secondary | ICD-10-CM | POA: Diagnosis not present

## 2023-09-30 DIAGNOSIS — Z4789 Encounter for other orthopedic aftercare: Secondary | ICD-10-CM | POA: Diagnosis not present

## 2023-10-04 ENCOUNTER — Telehealth (INDEPENDENT_AMBULATORY_CARE_PROVIDER_SITE_OTHER): Payer: Medicare HMO | Admitting: Psychiatry

## 2023-10-04 ENCOUNTER — Encounter (HOSPITAL_COMMUNITY): Payer: Self-pay | Admitting: Psychiatry

## 2023-10-04 DIAGNOSIS — F5101 Primary insomnia: Secondary | ICD-10-CM | POA: Diagnosis not present

## 2023-10-04 DIAGNOSIS — F3181 Bipolar II disorder: Secondary | ICD-10-CM | POA: Diagnosis not present

## 2023-10-04 DIAGNOSIS — F411 Generalized anxiety disorder: Secondary | ICD-10-CM | POA: Diagnosis not present

## 2023-10-04 DIAGNOSIS — F5102 Adjustment insomnia: Secondary | ICD-10-CM

## 2023-10-04 MED ORDER — DULOXETINE HCL 30 MG PO CPEP: 30.0000 mg | ORAL_CAPSULE | Freq: Every day | ORAL | 1 refills | Status: DC

## 2023-10-04 MED ORDER — LAMOTRIGINE 200 MG PO TABS: 200.0000 mg | ORAL_TABLET | Freq: Two times a day (BID) | ORAL | 1 refills | Status: DC

## 2023-10-04 MED ORDER — QUETIAPINE FUMARATE 200 MG PO TABS: 200.0000 mg | ORAL_TABLET | Freq: Every day | ORAL | 0 refills | Status: DC

## 2023-10-04 NOTE — Progress Notes (Signed)
[facility] Outpatient Follow up visit   Patient Identification: Paige Lozano MRN:  [id number] Date of Evaluation:  10/04/2023 Referral Source: [facility] Primary care Chief Complaint:    bipolar follow up , depression Visit Diagnosis:    ICD-10-CM   1. Bipolar 2 disorder (HCC)  F31.81     2. GAD (generalized anxiety disorder)  F41.1     3. Adjustment insomnia  F51.02     4. Primary insomnia  F51.01 QUEtiapine (SEROQUEL) 200 MG tablet    Virtual Visit via Video Note  I connected with Paige Lozano on 10/04/23 at  4:30 PM EST by a video enabled telemedicine application and verified that I am speaking with the correct person using two identifiers.  Location: Patient: home Provider: home office   I discussed the limitations of evaluation and management by telemedicine and the availability of in person appointments. The patient expressed understanding and agreed to proceed.      I discussed the assessment and treatment plan with the patient. The patient was provided an opportunity to ask questions and all were answered. The patient agreed with the plan and demonstrated an understanding of the instructions.   The patient was advised to call back or seek an in-person evaluation if the symptoms worsen or if the condition fails to improve as anticipated.  I provided 18 minutes of non-face-to-face time during this encounter.    lHistory of Present Illness:    Patient has had a difficult year back and forth from [place] dealing with her parents death and her daughter death 3 years ago. Last visit was after a long break of not being seen, re instated meds and reviewed was not doing well and depressed  Cymbalta was started   On eval doing better, has moved from last house landlord was giving her a hard time Doing better living with BF and support system Taking seroquel half of 400mg  at night, no rash  Overall feels more in control and looking for the holidays Suffers from MS and follows with  neurology    Aggravating factor: sureries . Multiple medical .surgeries, breakups in the past, Grief over parents death Modifying factor:  not that many, fe friends   Severity  some better Duration more then 10 years     Previous Psychotropic Medications: Yes   Substance Abuse History in the last 12 months:  Yes.   as per history Marijuana says it is medical for her condition.  Consequences of Substance Abuse: Medical Consequences:  fatigue, poor concenctration  Past Medical History:  Past Medical History:  Diagnosis Date   Anxiety    Bipolar 2 disorder, major depressive episode (HCC)    Complication of anesthesia    COPD (chronic obstructive pulmonary disease) (HCC)    smoker   Fibromyalgia    Insomnia    Multiple sclerosis (HCC)    PONV (postoperative nausea and vomiting)    Pulmonary embolism (HCC) 2000   Sleep apnea    mild OSA no CPAP   Ulcerative colitis (HCC)     Past Surgical History:  Procedure Laterality Date   FOOT SURGERY Left    bone spur   HEMORRHOID SURGERY     HUMERUS FRACTURE SURGERY Left    KNEE SURGERY Right 12/2021   MYOMECTOMY     NASAL SINUS SURGERY     SHOULDER ARTHROSCOPY WITH SUBACROMIAL DECOMPRESSION AND BICEP TENDON REPAIR Left 07/27/2016   Procedure: SHOULDER ARTHROSCOPY DEBRIDEMENT ROTATOR CUFF AND LABRUM, SUBACROMIAL DECOMPRESSION, BICEPS TENODESIS;  Surgeon: Jones Broom,  MD;  Location: [facility];  Service: Orthopedics;  Laterality: Left;  SHOULDER ARTHROSCOPY DEBRIDEMENT ROTATOR CUFF AND LABRUM, SUBACROMIAL DECOMPRESSION, BICEPS TENOTOMY, POSSIBLE TENODESIS   THUMB ARTHROSCOPY     5 surgeries on left thumb, 4 surgeries on right   TONSILLECTOMY AND ADENOIDECTOMY     WRIST FRACTURE SURGERY Left 2023     Family History:  Family History  Adopted: Yes  Problem Relation Age of Onset   Stroke Mother    Alzheimer's disease Father    Breast cancer Sister        Triple Negative    Social History:   Social  History   Socioeconomic History   Marital status: Divorced    Spouse name: Not on file   Number of children: 1   Years of education: 16   Highest education level: Bachelor's degree (e.g., BA, AB, BS)  Occupational History   Occupation: Legally disabled  Tobacco Use   Smoking status: Former    Current packs/day: 0.00    Average packs/day: 0.3 packs/day for 29.0 years (7.3 ttl pk-yrs)    Types: Cigarettes    Start date: 07/09/1987    Quit date: 07/08/2016    Years since quitting: 7.2   Smokeless tobacco: Never  Vaping Use   Vaping status: Every Day   Substances: Nicotine  Substance and Sexual Activity   Alcohol use: Yes    Alcohol/week: 0.0 standard drinks of alcohol    Comment: rare    Drug use: Yes    Types: Marijuana    Comment: occassional   Sexual activity: Yes    Partners: Male    Birth control/protection: None  Other Topics Concern   Not on file  Social History Narrative   Lives alone with her two dogs. She had a daughter who has passed away. Likes to work out and enjoys spending time with friends.   Social Determinants of Health   Financial Resource Strain: Medium Risk (09/21/2023)   Overall Financial Resource Strain (CARDIA)    Difficulty of Paying Living Expenses: Somewhat hard  Food Insecurity: Food Insecurity Present (09/21/2023)   Hunger Vital Sign    Worried About Running Out of Food in the Last Year: Sometimes true    Ran Out of Food in the Last Year: Sometimes true  Transportation Needs: No Transportation Needs (09/21/2023)   PRAPARE - Administrator, Civil Service (Medical): No    Lack of Transportation (Non-Medical): No  Physical Activity: Insufficiently Active (09/21/2023)   Exercise Vital Sign    Days of Exercise per Week: 3 days    Minutes of Exercise per Session: 40 min  Stress: No Stress Concern Present (09/21/2023)   [employer] of Occupational Health - Occupational Stress Questionnaire    Feeling of Stress : Not at all   Social Connections: Moderately Isolated (09/21/2023)   Social Connection and Isolation Panel [NHANES]    Frequency of Communication with Friends and Family: More than three times a week    Frequency of Social Gatherings with Friends and Family: More than three times a week    Attends Religious Services: More than 4 times per year    Active Member of [employer] or Organizations: No    Attends Banker Meetings: Never    Marital Status: Divorced     Allergies:   Allergies  Allergen Reactions   Bergera Koenigii Clementeen Graham Tree) Waldemar Dickens Koenigii] Anaphylaxis, Swelling and Hives   Ketamine Anaphylaxis   Other Anaphylaxis, Hives and Swelling  curry curry   Oxycodone Diarrhea and Other (See Comments)   Papaya Enzyme Anaphylaxis   Digestive Enzymes Swelling    Other reaction(s): Throat Swelling   Oxycodone Hcl Hives    Metabolic Disorder Labs: Lab Results  Component Value Date   HGBA1C 5.5 02/25/2023   No results found for: "PROLACTIN" Lab Results  Component Value Date   CHOL 220 (H) 02/25/2023   TRIG 213 (H) 02/25/2023   HDL 64 02/25/2023   CHOLHDL 3.4 02/25/2023   LDLCALC 123 (H) 02/25/2023     Current Medications: Current Outpatient Medications  Medication Sig Dispense Refill   albuterol (VENTOLIN HFA) 108 (90 Base) MCG/ACT inhaler INHALE 2 PUFFS INTO THE LUNGS EVERY 6 HOURS AS NEEDED FOR WHEEZING OR SHORTNESS OF BREATH 6.7 g 0   amphetamine-dextroamphetamine (ADDERALL) 5 MG tablet Take 1 tablet by mouth every morning.     Cholecalciferol 25 MCG (1000 UT) tablet Take by mouth.     clonazePAM (KLONOPIN) 0.5 MG tablet Take 1 tablet (0.5 mg total) by mouth in the morning and at bedtime. 60 tablet 5   DULoxetine (CYMBALTA) 30 MG capsule Take 1 capsule (30 mg total) by mouth daily. 30 capsule 1   gabapentin (NEURONTIN) 300 MG capsule Take 1 capsule (300 mg total) by mouth 3 (three) times daily. One tablet three times a day. 270 capsule 3   lamoTRIgine (LAMICTAL) 200  MG tablet Take 1 tablet (200 mg total) by mouth 2 (two) times daily. 60 tablet 1   methocarbamol (ROBAXIN) 500 MG tablet TAKE 1 TABLET THREE TIMES DAILY 270 tablet 3   Multiple Vitamin (MULTI-VITAMIN) tablet Take 1 tablet by mouth daily.     natalizumab (TYSABRI) 300 MG/15ML injection Inject into the vein.     QUEtiapine (SEROQUEL) 200 MG tablet Take 1 tablet (200 mg total) by mouth at bedtime. 30 tablet 0   sulfaSALAzine (AZULFIDINE) 500 MG tablet Take by mouth.     vitamin B-12 (CYANOCOBALAMIN) 1000 MCG tablet Take 1,000 mcg by mouth daily.     No current facility-administered medications for this visit.      Psychiatric Specialty Exam: Review of Systems  Cardiovascular:  Negative for chest pain.  Psychiatric/Behavioral:  Negative for suicidal ideas.     There were no vitals taken for this visit.There is no height or weight on file to calculate BMI.  General Appearance:   Eye Contact:   Speech:  Normal Rate  Volume:  normal  Mood: some better  Affect:   Thought Process:  Goal Directed  Orientation:  Full (Time, Place, and Person)  Thought Content:  Logical  Suicidal Thoughts:  No  Homicidal Thoughts:  No  Memory:  Immediate;   Fair Recent;   Fair  Judgement:  Fair  Insight:  Fair  Psychomotor Activity:  Normal  Concentration:  Concentration: Fair and Attention Span: Fair  Recall:  [employer] of Knowledge:Fair  Language: Fair  Akathisia:  Negative  Handed:  Right  AIMS (if indicated):    Assets:  Desire for Improvement  ADL's:  Intact  Cognition: WNL  Sleep:  Fair while on meds    Treatment Plan Summary: Medication management and Plan as follows    Prior documentation reviewed  Bipolar 2: depressed; some better and less depressed, continue lamictal, seroquel is now 200mg   Continue cymbalta Also on gaba from PCP  GAD: related to family, surgery, MS and finances,some better with cymbala, also on klonopine, should consider therapy  Insomnia:  reviewed sleep  hygiene states using seroquel 200mg  , no tremors   Avoid THC or substance use.  Fu 19m.   Patient/Guardian was advised Release of Information must be obtained prior to any record release in order to collaborate their care with an outside provider. Patient/Guardian was advised if they have not already done so to contact the registration department to sign all necessary forms in order for [place] to release information regarding their care.   Consent: Patient/Guardian gives verbal consent for treatment and assignment of benefits for services provided during this visit. Patient/Guardian expressed understanding and agreed to proceed.    Thresa Ross, MD 11/25/20244:31 PM

## 2023-10-13 ENCOUNTER — Ambulatory Visit: Payer: Self-pay

## 2023-10-13 NOTE — Patient Outreach (Signed)
  Care Coordination   Initial Visit Note   10/13/2023 Name: Jadden Prioleau MRN: [id number] DOB: 17-Oct-1966  Takota Rackow is a 57 y.o. year old female who sees [facility], Lonna Cobb, [place] for primary care. I spoke with  [name] by phone today.  What matters to the patients health and wellness today?  Patient requests to reschedule appointment as she is out in community.    Goals Addressed   None     SDOH assessments and interventions completed:  No     Care Coordination Interventions:  No, not indicated   Follow up plan: Follow up call scheduled for 10/15/23 at 2pm    Encounter Outcome:  Patient Scheduled

## 2023-10-15 ENCOUNTER — Ambulatory Visit: Payer: Self-pay

## 2023-10-15 NOTE — Patient Outreach (Signed)
  Care Coordination   10/15/2023 Name: Paige Lozano MRN: [id number] DOB: 08-Jul-1966   Care Coordination Outreach Attempts:  An unsuccessful telephone outreach was attempted for a scheduled appointment today.  Follow Up Plan:  Additional outreach attempts will be made to offer the patient care coordination information and services.   Encounter Outcome:  Patient Visit Completed   Care Coordination Interventions:  No, not indicated    Lysle Morales, BSW Social Worker  [phone]

## 2023-10-20 ENCOUNTER — Ambulatory Visit: Payer: Medicare HMO

## 2023-10-20 ENCOUNTER — Other Ambulatory Visit: Payer: Medicare HMO

## 2023-10-22 DIAGNOSIS — R630 Anorexia: Secondary | ICD-10-CM | POA: Diagnosis not present

## 2023-10-22 DIAGNOSIS — K5100 Ulcerative (chronic) pancolitis without complications: Secondary | ICD-10-CM | POA: Diagnosis not present

## 2023-10-22 DIAGNOSIS — R634 Abnormal weight loss: Secondary | ICD-10-CM | POA: Diagnosis not present

## 2023-10-22 DIAGNOSIS — R198 Other specified symptoms and signs involving the digestive system and abdomen: Secondary | ICD-10-CM | POA: Diagnosis not present

## 2023-10-22 DIAGNOSIS — R194 Change in bowel habit: Secondary | ICD-10-CM | POA: Diagnosis not present

## 2023-10-27 ENCOUNTER — Encounter: Payer: Self-pay | Admitting: Physician Assistant

## 2023-10-27 ENCOUNTER — Ambulatory Visit (INDEPENDENT_AMBULATORY_CARE_PROVIDER_SITE_OTHER): Payer: Medicare HMO | Admitting: Physician Assistant

## 2023-10-27 ENCOUNTER — Ambulatory Visit: Payer: Medicare HMO

## 2023-10-27 VITALS — BP 126/78 | HR 78

## 2023-10-27 DIAGNOSIS — Z780 Asymptomatic menopausal state: Secondary | ICD-10-CM

## 2023-10-27 DIAGNOSIS — R194 Change in bowel habit: Secondary | ICD-10-CM | POA: Diagnosis not present

## 2023-10-27 DIAGNOSIS — G35 Multiple sclerosis: Secondary | ICD-10-CM | POA: Diagnosis not present

## 2023-10-27 DIAGNOSIS — M2550 Pain in unspecified joint: Secondary | ICD-10-CM

## 2023-10-27 DIAGNOSIS — R634 Abnormal weight loss: Secondary | ICD-10-CM | POA: Diagnosis not present

## 2023-10-27 DIAGNOSIS — M353 Polymyalgia rheumatica: Secondary | ICD-10-CM | POA: Diagnosis not present

## 2023-10-27 DIAGNOSIS — M797 Fibromyalgia: Secondary | ICD-10-CM | POA: Diagnosis not present

## 2023-10-27 DIAGNOSIS — N6323 Unspecified lump in the left breast, lower outer quadrant: Secondary | ICD-10-CM | POA: Diagnosis not present

## 2023-10-27 DIAGNOSIS — Z13820 Encounter for screening for osteoporosis: Secondary | ICD-10-CM

## 2023-10-27 DIAGNOSIS — M8580 Other specified disorders of bone density and structure, unspecified site: Secondary | ICD-10-CM | POA: Insufficient documentation

## 2023-10-27 DIAGNOSIS — G6289 Other specified polyneuropathies: Secondary | ICD-10-CM | POA: Diagnosis not present

## 2023-10-27 DIAGNOSIS — K5100 Ulcerative (chronic) pancolitis without complications: Secondary | ICD-10-CM | POA: Diagnosis not present

## 2023-10-27 DIAGNOSIS — D171 Benign lipomatous neoplasm of skin and subcutaneous tissue of trunk: Secondary | ICD-10-CM

## 2023-10-27 NOTE — Progress Notes (Signed)
Acute Office Visit  Subjective:     Patient ID: Paige Lozano, female    DOB: [birth year], 57 y.o.   MRN: [id number]  No chief complaint on file.   HPI Patient is in today for left breast mass just found today when she went to screening mammogram. They would not do mammogram and stated she needed to be seen. She had not felt mass. It is not tender.   She does have hx of lipomas over body and more specifically her abdomen. She has a new tender nodule in her left lower quadrant. She would like consult to have removed. She has had some of them removed before.   Pt has known MS and having more neuropathy symptoms of lower legs.   She also has joint pain in hands, elbow, knees, hips. She wonders about RA and would like to be tested.   .. Active Ambulatory Problems    Diagnosis Date Noted   Multiple sclerosis (HCC) 05/29/2016   Fibromyalgia 05/29/2016   Osteoarthritis of thumb 05/29/2016   Insomnia 05/29/2016   Ulcerative pancolitis without complication (HCC) 05/29/2016   Bipolar 2 disorder (HCC) 05/29/2016   Nicotine dependence 05/29/2016   Primary osteoarthritis of right knee with recent ACL tear and femoral condylar impaction fracture 06/01/2016   Anxiety state 06/01/2016   Left shoulder pain 06/01/2016   GAD (generalized anxiety disorder) 07/20/2016   History of pulmonary embolus (PE) 07/21/2016   Right elbow pain 07/21/2016   Hot flashes 12/29/2016   Body aches 12/29/2016   Pruritus 12/29/2016   Palpitations 03/08/2017   Chest tightness 03/08/2017   Tachycardia 03/08/2017   Sinus tachycardia 03/12/2017   PAC (premature atrial contraction) 03/12/2017   Lumbar degenerative disc disease 06/02/2017   Plantar fasciitis, bilateral 10/29/2017   Onychomycosis 02/17/2018   Neurodermatitis 03/29/2018   Subcutaneous mass 04/07/2018   Family history of breast cancer 12/02/2018   Acute stress reaction 03/13/2019   Fracture of second and third metatarsals of the right foot  07/14/2019   Left wrist injury 07/31/2019   Thyroid disorder screen 09/04/2019   Actinic keratoses 09/04/2019   Abrasion 11/22/2019   History of lipoma 11/22/2019   Air hunger 11/28/2020   SOB (shortness of breath) 11/28/2020   Right bundle branch block 11/28/2020   Ulcerative colitis (HCC)    Sleep apnea    PONV (postoperative nausea and vomiting)    Pulmonary embolism (HCC) 2000   COPD (chronic obstructive pulmonary disease) (HCC)    Complication of anesthesia    Bipolar 2 disorder, major depressive episode (HCC)    Anxiety    Cervical radiculopathy 12/13/2020   Inflamed sebaceous cyst 02/11/2021   Raynaud's phenomenon without gangrene 08/29/2021   Stress at home 08/29/2021   Pain of left middle finger 11/28/2021   Stenosing tenosynovitis of finger of right hand 11/28/2021   Lipoma of torso 03/02/2022   Injury of left wrist 03/24/2022   Left wrist pain 03/24/2022   Traumatic closed nondisplaced metaphyseal torus fracture of distal radius with routine healing 03/24/2022   Complex tear of triangular fibrocartilage of left wrist 03/24/2022   Abnormal uterine bleeding 05/29/2014   Adjustment disorder with depressed mood 06/05/2014   Carpal tunnel syndrome, right 08/22/2014   Closed nondisplaced fracture of base of first metacarpal bone of left hand with routine healing 01/13/2017   Complex tear of medial meniscus of right knee as current injury 05/16/2020   Deviated nasal septum 01/14/2016   Diarrhea 09/27/2015   Diffuse myofascial pain syndrome  04/18/2014   Disorder of bone and cartilage 06/19/2018   Former smoker 06/19/2018   Hypertrophy of inferior nasal turbinate 01/14/2016   Impaired gait and mobility 02/19/2023   Iron deficiency 10/01/2015   Chronic pain of right knee 01/08/2023   Low back pain radiating to both legs 12/27/2014   Nonunion of osteotomy site 04/04/2014   Other reduced mobility 01/08/2023   Perimenopausal 06/19/2018   Silent sinus syndrome 02/11/2016    S/P right knee arthroscopy 11/21/2018   Third degree hemorrhoids 09/27/2015   Weakness of right leg 01/08/2023   Skin mass 08/30/2023   Osteopenia 10/27/2023   Arthralgia of multiple joints 11/01/2023   Polymyalgia (HCC) 11/01/2023   Mass of lower outer quadrant of left breast 11/01/2023   Peripheral neuropathy 11/01/2023   Resolved Ambulatory Problems    Diagnosis Date Noted   Right lateral epicondylitis 07/21/2016   Sinusitis 11/30/2016   Right wrist injury 12/29/2016   Hand injury, left, subsequent encounter 12/30/2016   Chills (without fever) 03/08/2017   No Additional Past Medical History     ROS See HPI.      Objective:    There were no vitals taken for this visit. BP Readings from Last 3 Encounters:  08/30/23 135/72  02/19/23 111/64  01/01/23 130/81   Wt Readings from Last 3 Encounters:  08/30/23 151 lb (68.5 kg)  02/19/23 155 lb 3.2 oz (70.4 kg)  01/01/23 151 lb 0.6 oz (68.5 kg)      Physical Exam Constitutional:      Appearance: Normal appearance.  Cardiovascular:     Rate and Rhythm: Normal rate.  Pulmonary:     Effort: Pulmonary effort is normal.     Comments: Left breast 4 oclock grape size mobile non tender mass.  Abdominal:     General: There is no distension.     Palpations: Abdomen is soft. There is no mass.     Tenderness: There is abdominal tenderness. There is no right CVA tenderness, left CVA tenderness, guarding or rebound.     Hernia: No hernia is present.     Comments: Tender walnut size mobile mass of left lower to mid abdomen.   Musculoskeletal:     Right lower leg: No edema.     Left lower leg: No edema.     Comments: Upper and lower extremity strength 5/5.    Neurological:     General: No focal deficit present.     Mental Status: She is alert and oriented to person, place, and time.  Psychiatric:        Mood and Affect: Mood normal.      Results for orders placed or performed in visit on 10/27/23  ANA,IFA RA Diag Pnl  w/rflx Tit/Patn  Result Value Ref Range   ANA Titer 1 Negative    Rheumatoid fact SerPl-aCnc <10.0 <14.0 IU/mL   Cyclic Citrullin Peptide Ab 4 0 - 19 units  Sed Rate (ESR)  Result Value Ref Range   Sed Rate 3 0 - 40 mm/hr  C-reactive protein  Result Value Ref Range   CRP <1 0 - 10 mg/L  CYCLIC CITRUL PEPTIDE ANTIBODY, IGG/IGA  Result Value Ref Range   Cyclic Citrullin Peptide Ab 4 0 - 19 units        Assessment & Plan:  Marland KitchenMarland KitchenDiagnoses and all orders for this visit:  Arthralgia of multiple joints -     ANA,IFA RA Diag Pnl w/rflx Tit/Patn -     Sed Rate (ESR) -  C-reactive protein -     Cyclic citrul peptide antibody, IgG -     CYCLIC CITRUL PEPTIDE ANTIBODY, IGG/IGA  Polymyalgia (HCC) -     ANA,IFA RA Diag Pnl w/rflx Tit/Patn -     Sed Rate (ESR) -     C-reactive protein -     Cyclic citrul peptide antibody, IgG -     CYCLIC CITRUL PEPTIDE ANTIBODY, IGG/IGA  Mass of lower outer quadrant of left breast -     Cancel: MM 3D DIAGNOSTIC MAMMOGRAM BILATERAL BREAST W/IMPLANT; Future -     Cancel: US BREAST COMPLETE UNI LEFT INC AXILLA -     [place] LIMITED ULTRASOUND INCLUDING AXILLA LEFT BREAST   Lipoma of torso -     Ambulatory referral to General Surgery  Multiple sclerosis (HCC)  Fibromyalgia  Other polyneuropathy   Diagnostic mammogram and u/s ordered to follow up on new left breast mass  Referral to general surgery for consult to remove mass Hx of lipoma with recent ultrasound   Follow up with neurology for MS and neuropathy  Will check ANA, ESR, CRP, RA factors to rule out joint and muscle pain from RA.   Tandy Gaw, PA-C

## 2023-10-27 NOTE — Patient Instructions (Addendum)
Breast mammogram ordered Added labs Will refer to general surgery Follow up with neurology on neuropathy

## 2023-10-27 NOTE — Progress Notes (Signed)
Osteopenia. Low bone mass. Vitamin D 1000 units calcium 1300mg  and regular exercise. Recheck in 2-3 years.

## 2023-10-28 ENCOUNTER — Ambulatory Visit
Admission: RE | Admit: 2023-10-28 | Discharge: 2023-10-28 | Disposition: A | Discharge status: Home / Self Care | Payer: Medicare HMO | Source: Ambulatory Visit | Attending: Physician Assistant | Admitting: Physician Assistant

## 2023-10-28 ENCOUNTER — Other Ambulatory Visit: Payer: Self-pay | Admitting: Physician Assistant

## 2023-10-28 DIAGNOSIS — Z96651 Presence of right artificial knee joint: Secondary | ICD-10-CM | POA: Diagnosis not present

## 2023-10-28 DIAGNOSIS — M2550 Pain in unspecified joint: Secondary | ICD-10-CM

## 2023-10-28 DIAGNOSIS — N6323 Unspecified lump in the left breast, lower outer quadrant: Secondary | ICD-10-CM

## 2023-10-28 DIAGNOSIS — M353 Polymyalgia rheumatica: Secondary | ICD-10-CM

## 2023-10-28 DIAGNOSIS — Z471 Aftercare following joint replacement surgery: Secondary | ICD-10-CM | POA: Diagnosis not present

## 2023-10-28 DIAGNOSIS — N6325 Unspecified lump in the left breast, overlapping quadrants: Secondary | ICD-10-CM | POA: Diagnosis not present

## 2023-10-28 DIAGNOSIS — D171 Benign lipomatous neoplasm of skin and subcutaneous tissue of trunk: Secondary | ICD-10-CM

## 2023-10-29 ENCOUNTER — Telehealth: Payer: Self-pay

## 2023-10-29 DIAGNOSIS — G35 Multiple sclerosis: Secondary | ICD-10-CM | POA: Diagnosis not present

## 2023-10-29 LAB — ANA,IFA RA DIAG PNL W/RFLX TIT/PATN
ANA Titer 1: NEGATIVE
Cyclic Citrullin Peptide Ab: 4 U (ref 0–19)
Rheumatoid fact SerPl-aCnc: <10 [IU]/mL (ref <14.0)

## 2023-10-29 LAB — SEDIMENTATION RATE: Sed Rate: 3 mm/h (ref 0–40)

## 2023-10-29 LAB — C-REACTIVE PROTEIN: CRP: <1 mg/L (ref 0–10)

## 2023-10-29 LAB — CYCLIC CITRUL PEPTIDE ANTIBODY, IGG/IGA: Cyclic Citrullin Peptide Ab: 4 U (ref 0–19)

## 2023-10-29 NOTE — Telephone Encounter (Signed)
Copied from CRM [id number]. Topic: Clinical - Lab/Test Results >> Oct 27, 2023  4:57 PM Antony Haste wrote: Reason for CRM: PT states she has a missed call for Labs, requesting to complete follow-up for her missed call.  Callback #: [id number]

## 2023-10-29 NOTE — Progress Notes (Signed)
RF factor and inflammatory markers are normal. Negative for rheumatoid arthritis.

## 2023-10-29 NOTE — Telephone Encounter (Signed)
Attempted return call to patient. Left a voice mail message requesting a return call.  ( Already a separate message regarding this in results notes.)

## 2023-11-01 ENCOUNTER — Encounter: Payer: Self-pay | Admitting: Physician Assistant

## 2023-11-01 DIAGNOSIS — N6323 Unspecified lump in the left breast, lower outer quadrant: Secondary | ICD-10-CM | POA: Insufficient documentation

## 2023-11-01 DIAGNOSIS — M2550 Pain in unspecified joint: Secondary | ICD-10-CM | POA: Insufficient documentation

## 2023-11-01 DIAGNOSIS — M353 Polymyalgia rheumatica: Secondary | ICD-10-CM | POA: Insufficient documentation

## 2023-11-01 DIAGNOSIS — G629 Polyneuropathy, unspecified: Secondary | ICD-10-CM | POA: Insufficient documentation

## 2023-11-13 ENCOUNTER — Other Ambulatory Visit (HOSPITAL_COMMUNITY): Payer: Self-pay | Admitting: Psychiatry

## 2023-11-24 DIAGNOSIS — R5383 Other fatigue: Secondary | ICD-10-CM | POA: Diagnosis not present

## 2023-11-24 DIAGNOSIS — R413 Other amnesia: Secondary | ICD-10-CM | POA: Diagnosis not present

## 2023-11-24 DIAGNOSIS — G35 Multiple sclerosis: Secondary | ICD-10-CM | POA: Diagnosis not present

## 2023-11-29 ENCOUNTER — Telehealth (HOSPITAL_COMMUNITY): Payer: Self-pay | Admitting: Psychiatry

## 2023-11-29 ENCOUNTER — Telehealth: Payer: Self-pay | Admitting: Physician Assistant

## 2023-11-29 DIAGNOSIS — R239 Unspecified skin changes: Secondary | ICD-10-CM

## 2023-11-29 NOTE — Telephone Encounter (Signed)
Patient left voicemail at 1608 today with refill request for 90-day supply of:   DULoxetine (CYMBALTA) 30 MG capsule   [facility] - [place], [place] - [address] Phone: [phone]  Fax: [phone]     Last ordered: 10/04/2023 -30 capsules with 1 refill  Last visit: 10/04/2023  Next visit: 12/27/2023  Patient stated she is down to her last 2 doses and the medication has been very helpful.

## 2023-11-29 NOTE — Telephone Encounter (Signed)
Copied from CRM [id number]. Topic: Referral - Request for Referral >> Nov 29, 2023 12:39 PM Gildardo Pounds wrote: Did the patient discuss referral with their provider in the last year? Yes (If No - schedule appointment) (If Yes - send message)  Appointment offered? No  Type of order/referral and detailed reason for visit: Dermatologist referral and Back referral  Preference of office, provider, location: as close to [place] as possible  If referral order, have you been seen by this specialty before? No (If Yes, this issue or another issue? When? Where?  Can we respond through MyChart? No  Please contact patient thank you

## 2023-11-30 MED ORDER — DULOXETINE HCL 30 MG PO CPEP: 30.0000 mg | ORAL_CAPSULE | Freq: Every day | ORAL | 1 refills | Status: DC

## 2023-12-02 DIAGNOSIS — G35 Multiple sclerosis: Secondary | ICD-10-CM | POA: Diagnosis not present

## 2023-12-03 NOTE — Telephone Encounter (Signed)
Paige Lozano, can you place a dermatology referral for patient? Please advise.

## 2023-12-06 ENCOUNTER — Telehealth (HOSPITAL_COMMUNITY): Payer: Self-pay | Admitting: *Deleted

## 2023-12-06 MED ORDER — DULOXETINE HCL 30 MG PO CPEP: 30.0000 mg | ORAL_CAPSULE | Freq: Every day | ORAL | 1 refills | Status: DC

## 2023-12-06 NOTE — Addendum Note (Signed)
Addended by: Thresa Ross on: 12/06/2023 12:54 PM   Modules accepted: Orders

## 2023-12-06 NOTE — Telephone Encounter (Signed)
PATIENT REQUEST DUE TO INSURANCE -- [facility] WILL BE MOST COST EFFECTIVE TO PAY FOR MEDICATION   [facility] [id number] [place], [place] - [address] [[id number]]   DULoxetine (CYMBALTA) 30 MG capsule   NEXT APPT  12/27/23 LAST APPT  10/04/23

## 2023-12-06 NOTE — Telephone Encounter (Signed)
*  THIS IS A NEW ORDER SENDING TO NEW Rx  [id number] - [place], [place] - [address] [[id number]]   DULoxetine (CYMBALTA) 30 MG capsule

## 2023-12-07 ENCOUNTER — Other Ambulatory Visit (HOSPITAL_COMMUNITY): Payer: Self-pay | Admitting: Psychiatry

## 2023-12-07 ENCOUNTER — Telehealth (HOSPITAL_COMMUNITY): Payer: Self-pay | Admitting: Psychiatry

## 2023-12-07 DIAGNOSIS — D171 Benign lipomatous neoplasm of skin and subcutaneous tissue of trunk: Secondary | ICD-10-CM | POA: Diagnosis not present

## 2023-12-07 NOTE — Telephone Encounter (Signed)
Patient called 12/06/2023 requesting letter from provider related to small claims court action she has taken against a former landlord related to emotional distress that she says occurred after  issues arose with the property she was renting and the landlord's response to her around this. She indicates this led to her being prescribed additional medications to cope. She would like the letter if possible by 12/10/2023. Attempted to clarify precisely what she expects/needs from provider letter with limited success. Advised patient to be available by phone in case provider needs additional information regarding her request.

## 2023-12-08 NOTE — Addendum Note (Signed)
Addended by: Jomarie Longs on: 12/08/2023 04:59 PM   Modules accepted: Orders

## 2023-12-27 ENCOUNTER — Telehealth (HOSPITAL_COMMUNITY): Payer: Medicare HMO | Admitting: Psychiatry

## 2023-12-27 ENCOUNTER — Encounter (HOSPITAL_COMMUNITY): Payer: Self-pay

## 2024-01-06 DIAGNOSIS — G35 Multiple sclerosis: Secondary | ICD-10-CM | POA: Diagnosis not present

## 2024-01-12 DIAGNOSIS — L821 Other seborrheic keratosis: Secondary | ICD-10-CM | POA: Diagnosis not present

## 2024-01-12 DIAGNOSIS — L814 Other melanin hyperpigmentation: Secondary | ICD-10-CM | POA: Diagnosis not present

## 2024-01-12 DIAGNOSIS — D225 Melanocytic nevi of trunk: Secondary | ICD-10-CM | POA: Diagnosis not present

## 2024-01-12 DIAGNOSIS — L570 Actinic keratosis: Secondary | ICD-10-CM | POA: Diagnosis not present

## 2024-01-12 DIAGNOSIS — C4361 Malignant melanoma of right upper limb, including shoulder: Secondary | ICD-10-CM | POA: Diagnosis not present

## 2024-01-12 DIAGNOSIS — Z7189 Other specified counseling: Secondary | ICD-10-CM | POA: Diagnosis not present

## 2024-01-12 DIAGNOSIS — D485 Neoplasm of uncertain behavior of skin: Secondary | ICD-10-CM | POA: Diagnosis not present

## 2024-01-17 DIAGNOSIS — M65331 Trigger finger, right middle finger: Secondary | ICD-10-CM | POA: Diagnosis not present

## 2024-01-17 DIAGNOSIS — G35 Multiple sclerosis: Secondary | ICD-10-CM | POA: Diagnosis not present

## 2024-01-17 DIAGNOSIS — M65842 Other synovitis and tenosynovitis, left hand: Secondary | ICD-10-CM | POA: Diagnosis not present

## 2024-01-17 DIAGNOSIS — S63637A Sprain of interphalangeal joint of left little finger, initial encounter: Secondary | ICD-10-CM | POA: Diagnosis not present

## 2024-01-25 ENCOUNTER — Other Ambulatory Visit: Payer: Self-pay | Admitting: General Surgery

## 2024-01-25 DIAGNOSIS — R222 Localized swelling, mass and lump, trunk: Secondary | ICD-10-CM | POA: Diagnosis not present

## 2024-01-25 DIAGNOSIS — C4361 Malignant melanoma of right upper limb, including shoulder: Secondary | ICD-10-CM | POA: Diagnosis not present

## 2024-01-25 DIAGNOSIS — D0361 Melanoma in situ of right upper limb, including shoulder: Secondary | ICD-10-CM

## 2024-01-26 DIAGNOSIS — R5383 Other fatigue: Secondary | ICD-10-CM | POA: Diagnosis not present

## 2024-01-26 DIAGNOSIS — C439 Malignant melanoma of skin, unspecified: Secondary | ICD-10-CM | POA: Diagnosis not present

## 2024-01-26 DIAGNOSIS — G35 Multiple sclerosis: Secondary | ICD-10-CM | POA: Diagnosis not present

## 2024-01-26 DIAGNOSIS — R413 Other amnesia: Secondary | ICD-10-CM | POA: Diagnosis not present

## 2024-02-01 ENCOUNTER — Other Ambulatory Visit (HOSPITAL_COMMUNITY): Payer: Self-pay | Admitting: *Deleted

## 2024-02-01 ENCOUNTER — Telehealth (HOSPITAL_COMMUNITY): Payer: Self-pay | Admitting: *Deleted

## 2024-02-01 MED ORDER — LAMOTRIGINE 200 MG PO TABS: 200.0000 mg | ORAL_TABLET | Freq: Two times a day (BID) | ORAL | 1 refills | Status: AC

## 2024-02-01 NOTE — Telephone Encounter (Signed)
 Spoke to Patient. She will call after work to schedule appointment within a month

## 2024-02-01 NOTE — Pre-Procedure Instructions (Signed)
 Surgical Instructions   Your procedure is scheduled on February 08, 2024. Report to [facility] Main Entrance "A" at 6:30 A.M., then check in with the Admitting office. Any questions or running late day of surgery: call [phone]  Questions prior to your surgery date: call [phone], Monday-Friday, 8am-4pm. If you experience any cold or flu symptoms such as cough, fever, chills, shortness of breath, etc. between now and your scheduled surgery, please notify us at the above number.     Remember:  Do not eat after midnight the night before your surgery   You may drink clear liquids until 5:30 AM the morning of your surgery.   Clear liquids allowed are: Water, Non-Citrus Juices (without pulp), Carbonated Beverages, Clear Tea (no milk, honey, etc.), Black Coffee Only (NO MILK, CREAM OR POWDERED CREAMER of any kind), and Gatorade.    Take these medicines the morning of surgery with A SIP OF WATER: clonazePAM (KLONOPIN)  DULoxetine (CYMBALTA)  gabapentin (NEURONTIN)  lamoTRIgine (LAMICTAL)  methocarbamol (ROBAXIN)    May take these medicines IF NEEDED: albuterol (VENTOLIN HFA) inhaler - please bring inhaler with you morning of surgery   One week prior to surgery, STOP taking any Aspirin (unless otherwise instructed by your surgeon) Aleve, Naproxen, Ibuprofen, Motrin, Advil, Goody's, BC's, all herbal medications, fish oil, and non-prescription vitamins.                     Do NOT Smoke (Tobacco/Vaping) for 24 hours prior to your procedure.  If you use a CPAP at night, you may bring your mask/headgear for your overnight stay.   You will be asked to remove any contacts, glasses, piercing's, hearing aid's, dentures/partials prior to surgery. Please bring cases for these items if needed.    Patients discharged the day of surgery will not be allowed to drive home, and someone needs to stay with them for 24 hours.  SURGICAL WAITING ROOM VISITATION Patients may have no more than 2  support people in the waiting area - these visitors may rotate.   Pre-op nurse will coordinate an appropriate time for 1 ADULT support person, who may not rotate, to accompany patient in pre-op.  Children under the age of 50 must have an adult with them who is not the patient and must remain in the main waiting area with an adult.  If the patient needs to stay at the [facility] during part of their recovery, the visitor guidelines for inpatient rooms apply.  Please refer to the [facility] website for the visitor guidelines for any additional information.   If you received a COVID test during your pre-op visit  it is requested that you wear a mask when out in public, stay away from anyone that may not be feeling well and notify your surgeon if you develop symptoms. If you have been in contact with anyone that has tested positive in the last 10 days please notify you surgeon.      Pre-operative CHG Bathing Instructions   You can play a key role in reducing the risk of infection after surgery. Your skin needs to be as free of germs as possible. You can reduce the number of germs on your skin by washing with CHG (chlorhexidine gluconate) soap before surgery. CHG is an antiseptic soap that kills germs and continues to kill germs even after washing.   DO NOT use if you have an allergy to chlorhexidine/CHG or antibacterial soaps. If your skin becomes reddened or irritated, stop using  the CHG and notify one of our RNs at [phone].              TAKE A SHOWER THE NIGHT BEFORE SURGERY AND THE DAY OF SURGERY    Please keep in mind the following:  DO NOT shave, including legs and underarms, 48 hours prior to surgery.   You may shave your face before/day of surgery.  Place clean sheets on your bed the night before surgery Use a clean washcloth (not used since being washed) for each shower. DO NOT sleep with pet's night before surgery.  CHG Shower Instructions:  Wash your face and private area with  normal soap. If you choose to wash your hair, wash first with your normal shampoo.  After you use shampoo/soap, rinse your hair and body thoroughly to remove shampoo/soap residue.  Turn the water OFF and apply half the bottle of CHG soap to a CLEAN washcloth.  Apply CHG soap ONLY FROM YOUR NECK DOWN TO YOUR TOES (washing for 3-5 minutes)  DO NOT use CHG soap on face, private areas, open wounds, or sores.  Pay special attention to the area where your surgery is being performed.  If you are having back surgery, having someone wash your back for you may be helpful. Wait 2 minutes after CHG soap is applied, then you may rinse off the CHG soap.  Pat dry with a clean towel  Put on clean pajamas    Additional instructions for the day of surgery: DO NOT APPLY any lotions, deodorants, cologne, or perfumes.   Do not wear jewelry or makeup Do not wear nail polish, gel polish, artificial nails, or any other type of covering on natural nails (fingers and toes) Do not bring valuables to the [facility]. [facility] is not responsible for valuables/personal belongings. Put on clean/comfortable clothes.  Please brush your teeth.  Ask your nurse before applying any prescription medications to the skin.

## 2024-02-01 NOTE — Progress Notes (Signed)
 PROVIDER AUTHORIZED REFILL  lamoTRIgine (LAMICTAL) 200 MG tablet   SENT CO SIGN

## 2024-02-01 NOTE — Telephone Encounter (Signed)
 PATIENT REQUEST 90 DAY REFILL NEWLY DIAGNOSED CANCER   lamoTRIgine (LAMICTAL) 200 MG tablet   [id number] - [place], [place] - [address] AT [address] [place]

## 2024-02-02 ENCOUNTER — Inpatient Hospital Stay (HOSPITAL_COMMUNITY)
Admission: RE | Admit: 2024-02-02 | Discharge: 2024-02-02 | Disposition: A | Discharge status: Home / Self Care | Source: Ambulatory Visit

## 2024-02-03 DIAGNOSIS — Z96651 Presence of right artificial knee joint: Secondary | ICD-10-CM | POA: Diagnosis not present

## 2024-02-04 ENCOUNTER — Encounter (HOSPITAL_COMMUNITY): Payer: Self-pay

## 2024-02-04 NOTE — Progress Notes (Signed)
 PCP - Tandy Gaw, PA-C Cardiologist - none  Chest x-ray - n/a EKG - n/a Stress Test - 03/09/17 ECHO - 12/10/20 Cardiac Cath - n/a  ICD Pacemaker/Loop - n/a  Sleep Study -  Yes-mild CPAP - does not use CPAP  Diabetes - n/a  Aspirin & Blood Thinner Instructions:  n/a  ERAS - clear liquids til 11:30 AM DOS  Anesthesia review: Yes  STOP now taking any Aspirin (unless otherwise instructed by your surgeon), Aleve, Naproxen, Ibuprofen, Motrin, Advil, Goody's, BC's, all herbal medications, fish oil, and all vitamins.   Coronavirus Screening Do you have any of the following symptoms:  Cough yes/no: No Fever (>100.85F)  yes/no: No Runny nose yes/no: No Sore throat yes/no: No Difficulty breathing/shortness of breath  yes/no: No  Have you traveled in the last 14 days and where? yes/no: No  Patient verbalized understanding of instructions that were given to them at the PAT appointment. Patient was also instructed that they will need to review over the PAT instructions again at home before surgery.

## 2024-02-04 NOTE — Pre-Procedure Instructions (Signed)
 Surgical Instructions   Your procedure is scheduled on February 08, 2024. Report to [facility] Main Entrance "A" at 11:50 A.M., then check in with the Admitting office. Any questions or running late day of surgery: call [phone]  Questions prior to your surgery date: call [phone], Monday-Friday, 8am-4pm. If you experience any cold or flu symptoms such as cough, fever, chills, shortness of breath, etc. between now and your scheduled surgery, please notify us at the above number.     Remember:  Do not eat after midnight the night before your surgery   You may drink clear liquids until 10:50 AM the morning of your surgery.   Clear liquids allowed are: Water, Non-Citrus Juices (without pulp), Carbonated Beverages, Clear Tea (no milk, honey, etc.), Black Coffee Only (NO MILK, CREAM OR POWDERED CREAMER of any kind), and Gatorade.    Take these medicines the morning of surgery with A SIP OF WATER: clonazePAM (KLONOPIN)  DULoxetine (CYMBALTA)  gabapentin (NEURONTIN)  lamoTRIgine (LAMICTAL)  methocarbamol (ROBAXIN)    May take these medicines IF NEEDED: albuterol (VENTOLIN HFA) inhaler - please bring inhaler with you morning of surgery   One week prior to surgery, STOP taking any Aspirin (unless otherwise instructed by your surgeon) Aleve, Naproxen, Ibuprofen, Motrin, Advil, Goody's, BC's, all herbal medications, fish oil, and non-prescription vitamins.                     Do NOT Smoke (Tobacco/Vaping) for 24 hours prior to your procedure.  If you use a CPAP at night, you may bring your mask/headgear for your overnight stay.   You will be asked to remove any contacts, glasses, piercing's, hearing aid's, dentures/partials prior to surgery. Please bring cases for these items if needed.    Patients discharged the day of surgery will not be allowed to drive home, and someone needs to stay with them for 24 hours.  SURGICAL WAITING ROOM VISITATION Patients may have no more than 2  support people in the waiting area - these visitors may rotate.   Pre-op nurse will coordinate an appropriate time for 1 ADULT support person, who may not rotate, to accompany patient in pre-op.  Children under the age of 85 must have an adult with them who is not the patient and must remain in the main waiting area with an adult.  If the patient needs to stay at the hospital during part of their recovery, the visitor guidelines for inpatient rooms apply.  Please refer to the [facility] website for the visitor guidelines for any additional information.   If you received a COVID test during your pre-op visit  it is requested that you wear a mask when out in public, stay away from anyone that may not be feeling well and notify your surgeon if you develop symptoms. If you have been in contact with anyone that has tested positive in the last 10 days please notify you surgeon.      Pre-operative CHG Bathing Instructions   You can play a key role in reducing the risk of infection after surgery. Your skin needs to be as free of germs as possible. You can reduce the number of germs on your skin by washing with CHG (chlorhexidine gluconate) soap before surgery. CHG is an antiseptic soap that kills germs and continues to kill germs even after washing.   DO NOT use if you have an allergy to chlorhexidine/CHG or antibacterial soaps. If your skin becomes reddened or irritated, stop using  the CHG and notify one of our RNs at [phone].              TAKE A SHOWER THE NIGHT BEFORE SURGERY AND THE DAY OF SURGERY    Please keep in mind the following:  DO NOT shave, including legs and underarms, 48 hours prior to surgery.   You may shave your face before/day of surgery.  Place clean sheets on your bed the night before surgery Use a clean washcloth (not used since being washed) for each shower. DO NOT sleep with pet's night before surgery.  CHG Shower Instructions:  Wash your face and private area with  normal soap. If you choose to wash your hair, wash first with your normal shampoo.  After you use shampoo/soap, rinse your hair and body thoroughly to remove shampoo/soap residue.  Turn the water OFF and apply half the bottle of CHG soap to a CLEAN washcloth.  Apply CHG soap ONLY FROM YOUR NECK DOWN TO YOUR TOES (washing for 3-5 minutes)  DO NOT use CHG soap on face, private areas, open wounds, or sores.  Pay special attention to the area where your surgery is being performed.  If you are having back surgery, having someone wash your back for you may be helpful. Wait 2 minutes after CHG soap is applied, then you may rinse off the CHG soap.  Pat dry with a clean towel  Put on clean pajamas    Additional instructions for the day of surgery: DO NOT APPLY any lotions, deodorants, cologne, or perfumes.   Do not wear jewelry or makeup Do not wear nail polish, gel polish, artificial nails, or any other type of covering on natural nails (fingers and toes) Do not bring valuables to the hospital. [facility] is not responsible for valuables/personal belongings. Put on clean/comfortable clothes.  Please brush your teeth.  Ask your nurse before applying any prescription medications to the skin.

## 2024-02-07 ENCOUNTER — Encounter (HOSPITAL_COMMUNITY): Payer: Self-pay

## 2024-02-07 ENCOUNTER — Encounter (HOSPITAL_COMMUNITY)
Admission: RE | Admit: 2024-02-07 | Discharge: 2024-02-07 | Disposition: A | Discharge status: Home / Self Care | Source: Ambulatory Visit | Attending: General Surgery | Admitting: General Surgery

## 2024-02-07 ENCOUNTER — Other Ambulatory Visit: Payer: Self-pay

## 2024-02-07 DIAGNOSIS — F419 Anxiety disorder, unspecified: Secondary | ICD-10-CM | POA: Diagnosis not present

## 2024-02-07 DIAGNOSIS — Z01812 Encounter for preprocedural laboratory examination: Secondary | ICD-10-CM | POA: Diagnosis not present

## 2024-02-07 DIAGNOSIS — M797 Fibromyalgia: Secondary | ICD-10-CM | POA: Insufficient documentation

## 2024-02-07 DIAGNOSIS — Z96651 Presence of right artificial knee joint: Secondary | ICD-10-CM | POA: Diagnosis not present

## 2024-02-07 DIAGNOSIS — Z86711 Personal history of pulmonary embolism: Secondary | ICD-10-CM | POA: Diagnosis not present

## 2024-02-07 DIAGNOSIS — G35 Multiple sclerosis: Secondary | ICD-10-CM | POA: Diagnosis not present

## 2024-02-07 DIAGNOSIS — C4361 Malignant melanoma of right upper limb, including shoulder: Secondary | ICD-10-CM | POA: Diagnosis not present

## 2024-02-07 DIAGNOSIS — K5190 Ulcerative colitis, unspecified, without complications: Secondary | ICD-10-CM | POA: Insufficient documentation

## 2024-02-07 DIAGNOSIS — G4733 Obstructive sleep apnea (adult) (pediatric): Secondary | ICD-10-CM | POA: Insufficient documentation

## 2024-02-07 DIAGNOSIS — Z79620 Long term (current) use of immunosuppressive biologic: Secondary | ICD-10-CM | POA: Diagnosis not present

## 2024-02-07 DIAGNOSIS — Z01818 Encounter for other preprocedural examination: Secondary | ICD-10-CM | POA: Diagnosis present

## 2024-02-07 DIAGNOSIS — K219 Gastro-esophageal reflux disease without esophagitis: Secondary | ICD-10-CM | POA: Insufficient documentation

## 2024-02-07 DIAGNOSIS — R222 Localized swelling, mass and lump, trunk: Secondary | ICD-10-CM | POA: Diagnosis not present

## 2024-02-07 DIAGNOSIS — F3181 Bipolar II disorder: Secondary | ICD-10-CM | POA: Insufficient documentation

## 2024-02-07 DIAGNOSIS — Z87891 Personal history of nicotine dependence: Secondary | ICD-10-CM | POA: Diagnosis not present

## 2024-02-07 HISTORY — DX: Other amnesia: R41.3

## 2024-02-07 HISTORY — DX: Unspecified osteoarthritis, unspecified site: M19.90

## 2024-02-07 HISTORY — DX: Gastro-esophageal reflux disease without esophagitis: K21.9

## 2024-02-07 HISTORY — DX: Depression, unspecified: F32.A

## 2024-02-07 LAB — COMPREHENSIVE METABOLIC PANEL WITH GFR
ALT: 18 U/L (ref 0–44)
AST: 23 U/L (ref 15–41)
Albumin: 3.8 g/dL (ref 3.5–5.0)
Alkaline Phosphatase: 81 U/L (ref 38–126)
Anion gap: 11 (ref 5–15)
BUN: 11 mg/dL (ref 6–20)
CO2: 24 mmol/L (ref 22–32)
Calcium: 9.4 mg/dL (ref 8.9–10.3)
Chloride: 105 mmol/L (ref 98–111)
Creatinine, Ser: 0.93 mg/dL (ref 0.44–1.00)
GFR, Estimated: >60 mL/min (ref >60)
Glucose, Bld: 109 mg/dL — ABNORMAL HIGH (ref 70–99)
Potassium: 4.2 mmol/L (ref 3.5–5.1)
Sodium: 140 mmol/L (ref 135–145)
Total Bilirubin: 0.6 mg/dL (ref 0.0–1.2)
Total Protein: 6.1 g/dL — ABNORMAL LOW (ref 6.5–8.1)

## 2024-02-07 LAB — CBC WITH DIFFERENTIAL/PLATELET
Abs Immature Granulocytes: 0.04 10*3/uL (ref 0.00–0.07)
Basophils Absolute: 0.1 10*3/uL (ref 0.0–0.1)
Basophils Relative: 1 %
Eosinophils Absolute: 0.7 10*3/uL — ABNORMAL HIGH (ref 0.0–0.5)
Eosinophils Relative: 10 %
HCT: 39.6 % (ref 36.0–46.0)
Hemoglobin: 12.8 g/dL (ref 12.0–15.0)
Immature Granulocytes: 1 %
Lymphocytes Relative: 48 %
Lymphs Abs: 3.5 10*3/uL (ref 0.7–4.0)
MCH: 29.6 pg (ref 26.0–34.0)
MCHC: 32.3 g/dL (ref 30.0–36.0)
MCV: 91.5 fL (ref 80.0–100.0)
Monocytes Absolute: 0.5 10*3/uL (ref 0.1–1.0)
Monocytes Relative: 7 %
Neutro Abs: 2.3 10*3/uL (ref 1.7–7.7)
Neutrophils Relative %: 33 %
Platelets: 179 10*3/uL (ref 150–400)
RBC: 4.33 MIL/uL (ref 3.87–5.11)
RDW: 14 % (ref 11.5–15.5)
WBC: 7.2 10*3/uL (ref 4.0–10.5)
nRBC: 0 % (ref 0.0–0.2)

## 2024-02-07 LAB — LACTATE DEHYDROGENASE: LDH: 154 U/L (ref 98–192)

## 2024-02-07 NOTE — Progress Notes (Signed)
 Patient updated on time of arrival for surgery on 02/08/2024. Patient to arrive at 0830.

## 2024-02-07 NOTE — Progress Notes (Signed)
 Anesthesia Chart Review:  Case: [id number] Date/Time: 02/08/24 1015   Procedures:      PROCEDURE, ADVANCEMENT FLAP, ABDOMEN - RIGHT UPPER ARM WIDE LOCAL EXCISION. ADVANCEME FLAP CLOSURE, SENTNEL NODE BIOPSY, EXCISION OF 2 ABDOMINAL WALL MASSES E/O MASS FLANK 3X3     BIOPSY, LYMPH NODE, SENTINEL     EXCISION, NEOPLASM, ABDOMINAL WALL - ADVANCEMT WALL MASS X2     EXCISION, MASS, TORSO   Anesthesia type: General   Diagnosis:      Melanoma of right upper arm (HCC) [C43.61]     Abdominal wall mass [R22.2]   Pre-op diagnosis:      MELONOMA RIGHT UPPER ARM     ABDOMINAL WALL MASS X2   [facility] OR ROOM 08 / [facility] OR   Surgeons: Almond Lint, MD       DISCUSSION: Patient is a 58 year old female scheduled for the above procedure. She has a RUE melanoma and two abdominal wall masses felt likely fat necrosis from previously MS injections.   History includes former smoker (quit 07/08/16), postoperative N/V, Bipolar 2 disorder, anxiety, Multiple sclerosis (on Tysarbri Q 5 weeks), OSA (does not use CPAP), fibromyalgia, PE (2000), GERD, ulcerative colitis, melanoma (RUE 01/2024), osteoarthritis (right TKA).   Last Tysarbri infusion noted is from 01/06/24.   Anesthesia team to evaluate on the day of surgery.     VS: BP 135/83   Pulse 90   Temp 36.9 C   Resp 16   Ht 5\' 5"  (1.651 m)   Wt 72.1 kg   LMP  (LMP Unknown)   SpO2 97%   BMI 26.46 kg/m    PROVIDERS: Jomarie Longs, PA-C is PCP  Burnett Corrente, MD is neurologist - Previous cardiology evaluation by Norman Herrlich, MD is 2022 for dyspnea and RBBB.   LABS: Labs reviewed: Acceptable for surgery. (all labs ordered are listed, but only abnormal results are displayed)  Labs Reviewed  CBC WITH DIFFERENTIAL/PLATELET - Abnormal; Notable for the following components:      Result Value   Eosinophils Absolute 0.7 (*)    All other components within normal limits  COMPREHENSIVE METABOLIC PANEL WITH GFR - Abnormal; Notable for the following  components:   Glucose, Bld 109 (*)    Total Protein 6.1 (*)    All other components within normal limits  LACTATE DEHYDROGENASE     IMAGES: CT Abd/pelvis 09/06/23: IMPRESSION: - Two small areas of fat necrosis in the subcutaneous tissues of the left mid and right lower quadrant abdominal wall. - Multiple small uterine fibroids.   EKG: EKG 12/10/20: ST at 102 bpm. Indeterminate axis. Right BBB.    CV: Echo 12/10/20: IMPRESSIONS   1. Left ventricular ejection fraction, by estimation, is 60 to 65%. The  left ventricle has normal function. The left ventricle has no regional  wall motion abnormalities. Left ventricular diastolic parameters were  normal.   2. Right ventricular systolic function is normal. The right ventricular  size is normal. There is normal pulmonary artery systolic pressure.   3. The mitral valve is normal in structure. No evidence of mitral valve  regurgitation. No evidence of mitral stenosis.   4. The aortic valve is normal in structure. Aortic valve regurgitation is  not visualized. No aortic stenosis is present.   5. The inferior vena cava is normal in size with greater than 50%  respiratory variability, suggesting right atrial pressure of 3 mmHg.    ETT 03/09/17: Blood pressure demonstrated a normal response to exercise.  There was no ST segment deviation noted during stress.   ETT with good exercise tolerance (9:00); no chest pain; normal BP response; no ST changes; negative adequate ETT; Duke treadmill score 9.  Past Medical History:  Diagnosis Date   Anxiety    Bipolar 2 disorder, major depressive episode (HCC)    Complication of anesthesia    PONV   COPD (chronic obstructive pulmonary disease) (HCC)    smoker   Depression    Fibromyalgia    GERD (gastroesophageal reflux disease)    no meds   Insomnia    Memory loss    r/t MS   Multiple sclerosis (HCC)    tx with adderall   osetoarthritis    PONV (postoperative nausea and vomiting)     Pulmonary embolism (HCC) 2000   Sleep apnea    mild OSA no CPAP   Ulcerative colitis (HCC)     Past Surgical History:  Procedure Laterality Date   FOOT SURGERY Left    bone spur   HEMORRHOID SURGERY     HUMERUS FRACTURE SURGERY Left    KNEE SURGERY Right 12/2021   MYOMECTOMY     NASAL SINUS SURGERY     SHOULDER ARTHROSCOPY WITH SUBACROMIAL DECOMPRESSION AND BICEP TENDON REPAIR Left 07/27/2016   Procedure: SHOULDER ARTHROSCOPY DEBRIDEMENT ROTATOR CUFF AND LABRUM, SUBACROMIAL DECOMPRESSION, BICEPS TENODESIS;  Surgeon: Jones Broom, MD;  Location: [facility];  Service: Orthopedics;  Laterality: Left;  SHOULDER ARTHROSCOPY DEBRIDEMENT ROTATOR CUFF AND LABRUM, SUBACROMIAL DECOMPRESSION, BICEPS TENOTOMY, POSSIBLE TENODESIS   THUMB ARTHROSCOPY     5 surgeries on left thumb, 4 surgeries on right   THYROID SURGERY     early 2000s -tx w/ 6 months  on anticoagulation- no issues since then as of 02/07/24   TONSILLECTOMY AND ADENOIDECTOMY     WRIST FRACTURE SURGERY Left 2023    MEDICATIONS:  albuterol (VENTOLIN HFA) 108 (90 Base) MCG/ACT inhaler   amphetamine-dextroamphetamine (ADDERALL) 5 MG tablet   Cholecalciferol 25 MCG (1000 UT) tablet   clonazePAM (KLONOPIN) 0.5 MG tablet   DULoxetine (CYMBALTA) 30 MG capsule   gabapentin (NEURONTIN) 300 MG capsule   lamoTRIgine (LAMICTAL) 200 MG tablet   methocarbamol (ROBAXIN) 500 MG tablet   Multiple Vitamin (MULTI-VITAMIN) tablet   natalizumab (TYSABRI) 300 MG/15ML injection   QUEtiapine (SEROQUEL) 200 MG tablet   QUEtiapine (SEROQUEL) 200 MG tablet   QUEtiapine (SEROQUEL) 200 MG tablet   vitamin B-12 (CYANOCOBALAMIN) 1000 MCG tablet   No current facility-administered medications for this encounter.    Shonna Chock, PA-C Surgical Short Stay/Anesthesiology [facility] Phone [phone] [facility] Phone [phone] 02/07/2024 4:34 PM

## 2024-02-07 NOTE — Pre-Procedure Instructions (Signed)
 Surgical Instructions   Your procedure is scheduled on February 08, 2024. Report to [facility] Main Entrance "A" at 12:30 P.M., then check in with the Admitting office. Any questions or running late day of surgery: call [phone]  Questions prior to your surgery date: call [phone], Monday-Friday, 8am-4pm. If you experience any cold or flu symptoms such as cough, fever, chills, shortness of breath, etc. between now and your scheduled surgery, please notify us at the above number.     Remember:  Do not eat after midnight the night before your surgery-tonight (Monday)   You may drink clear liquids until 10:50 AM the morning of your surgery-Tuesday.   Clear liquids allowed are: Water, Non-Citrus Juices (without pulp), Carbonated Beverages, Clear Tea (no milk, honey, etc.), Black Coffee Only (NO MILK, CREAM OR POWDERED CREAMER of any kind), and Gatorade.    Take these medicines the morning of surgery with A SIP OF WATER: clonazePAM (KLONOPIN)  DULoxetine (CYMBALTA)  gabapentin (NEURONTIN)  lamoTRIgine (LAMICTAL)  methocarbamol (ROBAXIN)    May take these medicines IF NEEDED: albuterol (VENTOLIN HFA) inhaler - please bring inhaler with you morning of surgery   STOP now taking any Aspirin (unless otherwise instructed by your surgeon) Aleve, Naproxen, Ibuprofen, Motrin, Advil, Goody's, BC's, all herbal medications, fish oil, and non-prescription vitamins.                     Do NOT Smoke (Tobacco/Vaping) for 24 hours prior to your procedure.  If you use a CPAP at night, you may bring your mask/headgear for your overnight stay.   You will be asked to remove any contacts, glasses, piercing's, hearing aid's, dentures/partials prior to surgery. Please bring cases for these items if needed.    Patients discharged the day of surgery will not be allowed to drive home, and someone needs to stay with them for 24 hours.  SURGICAL WAITING ROOM VISITATION Patients may have no more than 2  support people in the waiting area - these visitors may rotate.   Pre-op nurse will coordinate an appropriate time for 1 ADULT support person, who may not rotate, to accompany patient in pre-op.  Children under the age of 30 must have an adult with them who is not the patient and must remain in the main waiting area with an adult.  If the patient needs to stay at the hospital during part of their recovery, the visitor guidelines for inpatient rooms apply.  Please refer to the [facility] website for the visitor guidelines for any additional information.   If you received a COVID test during your pre-op visit  it is requested that you wear a mask when out in public, stay away from anyone that may not be feeling well and notify your surgeon if you develop symptoms. If you have been in contact with anyone that has tested positive in the last 10 days please notify you surgeon.      Pre-operative CHG Bathing Instructions   You can play a key role in reducing the risk of infection after surgery. Your skin needs to be as free of germs as possible. You can reduce the number of germs on your skin by washing with CHG (chlorhexidine gluconate) soap before surgery. CHG is an antiseptic soap that kills germs and continues to kill germs even after washing.   DO NOT use if you have an allergy to chlorhexidine/CHG or antibacterial soaps. If your skin becomes reddened or irritated, stop using the CHG and  notify one of our RNs at [phone].              TAKE A SHOWER THE NIGHT BEFORE SURGERY AND THE DAY OF SURGERY    Please keep in mind the following:  DO NOT shave, including legs and underarms, 48 hours prior to surgery.   You may shave your face before/day of surgery.  Place clean sheets on your bed the night before surgery Use a clean washcloth (not used since being washed) for each shower. DO NOT sleep with pet's night before surgery.  CHG Shower Instructions:  Wash your face and private area with  normal soap. If you choose to wash your hair, wash first with your normal shampoo.  After you use shampoo/soap, rinse your hair and body thoroughly to remove shampoo/soap residue.  Turn the water OFF and apply half the bottle of CHG soap to a CLEAN washcloth.  Apply CHG soap ONLY FROM YOUR NECK DOWN TO YOUR TOES (washing for 3-5 minutes)  DO NOT use CHG soap on face, private areas, open wounds, or sores.  Pay special attention to the area where your surgery is being performed.  If you are having back surgery, having someone wash your back for you may be helpful. Wait 2 minutes after CHG soap is applied, then you may rinse off the CHG soap.  Pat dry with a clean towel  Put on clean pajamas    Additional instructions for the day of surgery: DO NOT APPLY any lotions, deodorants, cologne, or perfumes.   Do not wear jewelry or makeup Do not wear nail polish, gel polish, artificial nails, or any other type of covering on natural nails (fingers and toes) Do not bring valuables to the hospital. [facility] is not responsible for valuables/personal belongings. Put on clean/comfortable clothes.  Please brush your teeth.  Ask your nurse before applying any prescription medications to the skin.

## 2024-02-07 NOTE — H&P (Signed)
 Subjective Paige Lozano is a 58 y.o. 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  She was referred by Dr. Modena Jansky.  Patient had fleshy lesion in the right upper proximal arm.  This was biopsied and was demonstrated to be 3.5 mm at least in Breslow thickness.  The deep and peripheral margins were involved.  There were no other poor prognostic factors such as no microsatellitosis, perineural invasion, or lymphovascular invasion, pathologic stage is T4a NX MX. History of Present Illness She expresses concern about the potential spread of the melanoma to the lymph nodes. The patient has also been scheduled for surgery to remove two abdominal wall masses, which were felt to possibly be lipomas.  She is concerned that this is scar tissue from previous MS injections. The patient mentions a low pain tolerance and a history of nerve damage. She is also planning a road trip, which she is concerned might be affected by the upcoming surgeries.   Patient does have significant sun exposure history, but no sunburns.  She does have some Native American ancestry and has not had issues with blistering.  She grew up in [place] and has most recently lived in [facility] for several years.  She only started wearing sunscreen a few years ago at the insistence of her family members.  She denies any family history of melanoma.  Her sister did have a skin cancer on her nose, but it was not melanoma.  The patient is adopted and does not know any of her other family cancer history.   Review of Systems  All other systems reviewed and are negative.     Problem List     Patient Active Problem List  Diagnosis   Melanoma of right upper arm (CMS/HHS-HCC)   Abdominal wall mass        Medications Prior to Visit        Outpatient Medications Prior to Visit  Medication Sig Dispense Refill   albuterol MDI, PROVENTIL, VENTOLIN,  PROAIR, HFA 90 mcg/actuation inhaler Inhale 2 inhalations into the lungs every 6 (six) hours as needed       cholecalciferol (VITAMIN D3) 1000 unit tablet Take by mouth       clonazePAM (KLONOPIN) 0.5 MG tablet Take 0.5 mg by mouth 2 (two) times daily as needed       dextroamphetamine-amphetamine (ADDERALL) 5 mg tablet Take 5 mg by mouth every morning       DULoxetine (CYMBALTA) 30 MG DR capsule Take 30 mg by mouth once daily       gabapentin (NEURONTIN) 300 MG capsule Take 300 mg by mouth       lamoTRIgine (LAMICTAL) 200 MG tablet Take 200 mg by mouth 2 (two) times daily       methocarbamoL (ROBAXIN) 500 MG tablet Take 500 mg by mouth 3 (three) times daily as needed       multivitamin tablet Take 1 tablet by mouth once daily       natalizumab (TYSABRI) 300 mg/15 mL injection Inject into the vein       QUEtiapine (SEROQUEL) 200 MG tablet Take 1 tablet by mouth at bedtime       sulfaSALAzine (AZULFIDINE) 500 mg tablet Take 1,000 mg by mouth 2 (two) times daily        No facility-administered medications prior to visit.  Objective    Vitals:    01/25/24 1533  Pulse: 101  Temp: 36.7 C (98 F)  SpO2: 98%  Weight: 71.6 kg (157 lb 12.8 oz)  Height: 172.7 cm (5\' 8" )  PainSc:   2    Body mass index is 23.99 kg/m.   Home Vitals:      Head:   Normocephalic and atraumatic.  Mouth/Throat: Oropharynx is clear and moist. No oropharyngeal exudate.  Eyes:   Conjunctivae are normal. Pupils are equal, round, and reactive to light. No scleral icterus.  Neck:   Normal range of motion. Neck supple. No tracheal deviation present. No thyromegaly present.  Cardiovascular: Normal rate, regular rhythm, normal heart sounds and intact distal pulses.  Exam reveals no gallop and no friction rub.   No murmur heard. Respiratory: Effort normal and breath sounds normal. No respiratory distress. No wheezes, rales or rhonchi.  No chest wall tenderness.  GI:       Soft. Bowel sounds are normal.  Abdomen is soft, non tender, non distended.  No masses or hepatosplenomegaly is present. There is no rebound and no guarding.  Musculoskeletal: . Extremities are non tender and without deformity.  Lymphadenopathy:   No cervical or axillary adenopathy.  Neurological: Alert and oriented to person, place, and time. Coordination normal.  Skin:    Skin is warm and dry. No rash noted. No diaphoresis. No erythema. No pallor.  Scar and a portion of tattoo on right upper arm.  This is a very personal tattoo to the patient as it involves her late daughter's art.  Fortunately the area is proximal to her daughter's signature.  The scab is approximately 7 x 9 mm.  It is in the arrow portion of the tattoo. Psychiatric: Normal mood and affect.Behavior is normal. Judgment and thought content normal.    Results PATHOLOGY Melanoma biopsy: Breslow depth 3.5 mm         Assessment/Plan:    Assessment & Plan Melanoma 3.5 mm melanoma requires urgent surgical intervention due to depth and metastasis risk. Explained surgical plan, potential scar, and risks including wound dehiscence, infection, seroma, and numbness. Discussed tattoo preservation. Informed about possible immunotherapy and PET scan if lymph nodes are positive. - Schedule wide local excision with advancement flap closure and sentinel lymph node biopsy. - Order PET scan postoperatively if lymph nodes are positive. - Coordinate with oncology for potential immunotherapy if lymph nodes are positive. - Preserve as much of her tattoo as possible during surgery.   Abdominal Wall Masses Two masses likely fat necrosis from MS injections. Plan to excise during melanoma surgery to minimize surgeries. - Excise two abdominal wall masses during the melanoma surgery.   Multiple Sclerosis (MS) She receives Tysabri infusions every five weeks. Concerned about surgery impact on MS. Coordination with neurologist needed for anesthesia and MS management. - Coordinate  with her neurologist regarding MS management and any special considerations for anesthesia.   Follow-up She plans a trip at the end of April. Informed that immunotherapy, if needed, starts 4-8 weeks post-surgery, allowing travel. Surgery to be scheduled promptly. - Schedule surgery as soon as possible.     Diagnoses and all orders for this visit:   Melanoma of right upper arm (CMS/HHS-HCC)   Abdominal wall mass

## 2024-02-07 NOTE — Anesthesia Preprocedure Evaluation (Signed)
 Anesthesia Evaluation  Patient identified by MRN, date of birth, ID band Patient awake    Reviewed: Allergy & Precautions, NPO status , Patient's Chart, lab work & pertinent test results  History of Anesthesia Complications (+) PONV and history of anesthetic complications  Airway Mallampati: II  TM Distance: >3 FB Neck ROM: Full    Dental no notable dental hx.    Pulmonary sleep apnea , COPD,  COPD inhaler, former smoker, PE   Pulmonary exam normal        Cardiovascular + dysrhythmias  Rhythm:Regular Rate:Normal  Echo 12/10/20: IMPRESSIONS   1. Left ventricular ejection fraction, by estimation, is 60 to 65%. The  left ventricle has normal function. The left ventricle has no regional  wall motion abnormalities. Left ventricular diastolic parameters were  normal.   2. Right ventricular systolic function is normal. The right ventricular  size is normal. There is normal pulmonary artery systolic pressure.   3. The mitral valve is normal in structure. No evidence of mitral valve  regurgitation. No evidence of mitral stenosis.   4. The aortic valve is normal in structure. Aortic valve regurgitation is  not visualized. No aortic stenosis is present.   5. The inferior vena cava is normal in size with greater than 50%  respiratory variability, suggesting right atrial pressure of 3 mmHg.       Neuro/Psych   Anxiety Depression Bipolar Disorder    Neuromuscular disease (MS)    GI/Hepatic PUD,GERD  ,,  Endo/Other    Renal/GU   negative genitourinary   Musculoskeletal  (+) Arthritis , Osteoarthritis,  Fibromyalgia -Melanoma upper arm and abdominal wall mass   Abdominal Normal abdominal exam  (+)   Peds  Hematology   Anesthesia Other Findings   Reproductive/Obstetrics                             Anesthesia Physical Anesthesia Plan  ASA: 3  Anesthesia Plan: General   Post-op Pain Management:  Tylenol PO (pre-op)*   Induction: Intravenous  PONV Risk Score and Plan: 4 or greater and Ondansetron, Dexamethasone, Midazolam and Treatment may vary due to age or medical condition  Airway Management Planned: Mask and LMA  Additional Equipment: None  Intra-op Plan:   Post-operative Plan: Extubation in OR  Informed Consent: I have reviewed the patients History and Physical, chart, labs and discussed the procedure including the risks, benefits and alternatives for the proposed anesthesia with the patient or authorized representative who has indicated his/her understanding and acceptance.     Dental advisory given  Plan Discussed with: CRNA  Anesthesia Plan Comments: (PAT note written 02/07/2024 by Shonna Chock, PA-C.  )       Anesthesia Quick Evaluation

## 2024-02-07 NOTE — Pre-Procedure Instructions (Deleted)
 Surgical Instructions   Your procedure is scheduled on February 08, 2024. Report to [facility] Main Entrance "A" at 12:30 A.M., then check in with the Admitting office. Any questions or running late day of surgery: call [phone]  Questions prior to your surgery date: call [phone], Monday-Friday, 8am-4pm. If you experience any cold or flu symptoms such as cough, fever, chills, shortness of breath, etc. between now and your scheduled surgery, please notify us at the above number.     Remember:  Do not eat after midnight the night before your surgery-Monday   You may drink clear liquids until 10:50 AM the morning of your surgery-Tuesday.   Clear liquids allowed are: Water, Non-Citrus Juices (without pulp), Carbonated Beverages, Clear Tea (no milk, honey, etc.), Black Coffee Only (NO MILK, CREAM OR POWDERED CREAMER of any kind), and Gatorade.    Take these medicines the morning of surgery with A SIP OF WATER: clonazePAM (KLONOPIN)  DULoxetine (CYMBALTA)  gabapentin (NEURONTIN)  lamoTRIgine (LAMICTAL)  methocarbamol (ROBAXIN)    May take these medicines IF NEEDED: albuterol (VENTOLIN HFA) inhaler - please bring inhaler with you morning of surgery   One week prior to surgery, STOP taking any Aspirin (unless otherwise instructed by your surgeon) Aleve, Naproxen, Ibuprofen, Motrin, Advil, Goody's, BC's, all herbal medications, fish oil, and non-prescription vitamins.                     Do NOT Smoke (Tobacco/Vaping) for 24 hours prior to your procedure.  If you use a CPAP at night, you may bring your mask/headgear for your overnight stay.   You will be asked to remove any contacts, glasses, piercing's, hearing aid's, dentures/partials prior to surgery. Please bring cases for these items if needed.    Patients discharged the day of surgery will not be allowed to drive home, and someone needs to stay with them for 24 hours.  SURGICAL WAITING ROOM VISITATION Patients may have no  more than 2 support people in the waiting area - these visitors may rotate.   Pre-op nurse will coordinate an appropriate time for 1 ADULT support person, who may not rotate, to accompany patient in pre-op.  Children under the age of 59 must have an adult with them who is not the patient and must remain in the main waiting area with an adult.  If the patient needs to stay at the hospital during part of their recovery, the visitor guidelines for inpatient rooms apply.  Please refer to the [facility] website for the visitor guidelines for any additional information.   If you received a COVID test during your pre-op visit  it is requested that you wear a mask when out in public, stay away from anyone that may not be feeling well and notify your surgeon if you develop symptoms. If you have been in contact with anyone that has tested positive in the last 10 days please notify you surgeon.      Pre-operative CHG Bathing Instructions   You can play a key role in reducing the risk of infection after surgery. Your skin needs to be as free of germs as possible. You can reduce the number of germs on your skin by washing with CHG (chlorhexidine gluconate) soap before surgery. CHG is an antiseptic soap that kills germs and continues to kill germs even after washing.   DO NOT use if you have an allergy to chlorhexidine/CHG or antibacterial soaps. If your skin becomes reddened or irritated, stop using  the CHG and notify one of our RNs at [phone].              TAKE A SHOWER THE NIGHT BEFORE SURGERY AND THE DAY OF SURGERY    Please keep in mind the following:  DO NOT shave, including legs and underarms, 48 hours prior to surgery.   You may shave your face before/day of surgery.  Place clean sheets on your bed the night before surgery Use a clean washcloth (not used since being washed) for each shower. DO NOT sleep with pet's night before surgery.  CHG Shower Instructions:  Wash your face and  private area with normal soap. If you choose to wash your hair, wash first with your normal shampoo.  After you use shampoo/soap, rinse your hair and body thoroughly to remove shampoo/soap residue.  Turn the water OFF and apply half the bottle of CHG soap to a CLEAN washcloth.  Apply CHG soap ONLY FROM YOUR NECK DOWN TO YOUR TOES (washing for 3-5 minutes)  DO NOT use CHG soap on face, private areas, open wounds, or sores.  Pay special attention to the area where your surgery is being performed.  If you are having back surgery, having someone wash your back for you may be helpful. Wait 2 minutes after CHG soap is applied, then you may rinse off the CHG soap.  Pat dry with a clean towel  Put on clean pajamas    Additional instructions for the day of surgery: DO NOT APPLY any lotions, deodorants, cologne, or perfumes.   Do not wear jewelry or makeup Do not wear nail polish, gel polish, artificial nails, or any other type of covering on natural nails (fingers and toes) Do not bring valuables to the hospital. [facility] is not responsible for valuables/personal belongings. Put on clean/comfortable clothes.  Please brush your teeth.  Ask your nurse before applying any prescription medications to the skin.

## 2024-02-08 ENCOUNTER — Ambulatory Visit (HOSPITAL_BASED_OUTPATIENT_CLINIC_OR_DEPARTMENT_OTHER): Payer: Self-pay

## 2024-02-08 ENCOUNTER — Ambulatory Visit (HOSPITAL_COMMUNITY)
Admission: RE | Admit: 2024-02-08 | Discharge: 2024-02-08 | Disposition: A | Discharge status: Home / Self Care | Attending: General Surgery | Admitting: General Surgery

## 2024-02-08 ENCOUNTER — Other Ambulatory Visit: Payer: Self-pay

## 2024-02-08 ENCOUNTER — Encounter (HOSPITAL_COMMUNITY)
Admission: RE | Disposition: A | Discharge status: Home / Self Care | Payer: Self-pay | Source: Home / Self Care | Attending: General Surgery

## 2024-02-08 ENCOUNTER — Encounter (HOSPITAL_COMMUNITY): Payer: Self-pay | Admitting: General Surgery

## 2024-02-08 ENCOUNTER — Ambulatory Visit (HOSPITAL_COMMUNITY)
Admission: RE | Admit: 2024-02-08 | Discharge: 2024-02-08 | Disposition: A | Discharge status: Home / Self Care | Source: Ambulatory Visit | Attending: General Surgery | Admitting: General Surgery

## 2024-02-08 ENCOUNTER — Ambulatory Visit (HOSPITAL_COMMUNITY): Payer: Self-pay | Admitting: Vascular Surgery

## 2024-02-08 ENCOUNTER — Other Ambulatory Visit (HOSPITAL_COMMUNITY): Payer: Self-pay

## 2024-02-08 DIAGNOSIS — C4361 Malignant melanoma of right upper limb, including shoulder: Secondary | ICD-10-CM | POA: Diagnosis not present

## 2024-02-08 DIAGNOSIS — Z87891 Personal history of nicotine dependence: Secondary | ICD-10-CM | POA: Insufficient documentation

## 2024-02-08 DIAGNOSIS — G4733 Obstructive sleep apnea (adult) (pediatric): Secondary | ICD-10-CM | POA: Diagnosis not present

## 2024-02-08 DIAGNOSIS — D235 Other benign neoplasm of skin of trunk: Secondary | ICD-10-CM | POA: Diagnosis not present

## 2024-02-08 DIAGNOSIS — Z86711 Personal history of pulmonary embolism: Secondary | ICD-10-CM | POA: Insufficient documentation

## 2024-02-08 DIAGNOSIS — K654 Sclerosing mesenteritis: Secondary | ICD-10-CM | POA: Diagnosis not present

## 2024-02-08 DIAGNOSIS — J449 Chronic obstructive pulmonary disease, unspecified: Secondary | ICD-10-CM

## 2024-02-08 DIAGNOSIS — G35 Multiple sclerosis: Secondary | ICD-10-CM | POA: Diagnosis not present

## 2024-02-08 DIAGNOSIS — F418 Other specified anxiety disorders: Secondary | ICD-10-CM

## 2024-02-08 DIAGNOSIS — K219 Gastro-esophageal reflux disease without esophagitis: Secondary | ICD-10-CM | POA: Diagnosis not present

## 2024-02-08 DIAGNOSIS — M797 Fibromyalgia: Secondary | ICD-10-CM | POA: Insufficient documentation

## 2024-02-08 DIAGNOSIS — D2261 Melanocytic nevi of right upper limb, including shoulder: Secondary | ICD-10-CM | POA: Insufficient documentation

## 2024-02-08 DIAGNOSIS — G4730 Sleep apnea, unspecified: Secondary | ICD-10-CM | POA: Diagnosis not present

## 2024-02-08 DIAGNOSIS — R222 Localized swelling, mass and lump, trunk: Secondary | ICD-10-CM | POA: Diagnosis present

## 2024-02-08 HISTORY — PX: ABDOMINAL ADVANCEMENT FLAP: SHX6151

## 2024-02-08 HISTORY — PX: EXCISION OF ABDOMINAL WALL TUMOR: SHX6687

## 2024-02-08 HISTORY — PX: EXCISION MASS ABDOMINAL: SHX6701

## 2024-02-08 HISTORY — PX: SENTINEL NODE BIOPSY: SHX6608

## 2024-02-08 SURGERY — PROCEDURE, ADVANCEMENT FLAP, ABDOMEN
Anesthesia: General

## 2024-02-08 MED ORDER — SODIUM CHLORIDE 0.9 % IV SOLN: INTRAVENOUS | Status: DC | PRN

## 2024-02-08 MED ORDER — HYDROMORPHONE HCL 1 MG/ML IJ SOLN: 0.2500 mg | INTRAMUSCULAR | Status: DC | PRN

## 2024-02-08 MED ORDER — AMISULPRIDE (ANTIEMETIC) 5 MG/2ML IV SOLN: 10.0000 mg | Freq: Once | INTRAVENOUS | Status: DC | PRN

## 2024-02-08 MED ORDER — PROPOFOL 1000 MG/100ML IV EMUL
INTRAVENOUS | Status: AC
  Filled 2024-02-08: qty 100

## 2024-02-08 MED ORDER — LIDOCAINE 2% (20 MG/ML) 5 ML SYRINGE
INTRAMUSCULAR | Status: DC | PRN
  Administered 2024-02-08: 100 mg via INTRAVENOUS

## 2024-02-08 MED ORDER — FENTANYL CITRATE (PF) 250 MCG/5ML IJ SOLN
INTRAMUSCULAR | Status: AC
  Filled 2024-02-08: qty 5

## 2024-02-08 MED ORDER — HYDROCODONE-ACETAMINOPHEN 5-325 MG PO TABS
1.0000 | ORAL_TABLET | Freq: Four times a day (QID) | ORAL | 0 refills | Status: DC | PRN
  Filled 2024-02-08: qty 30, 8d supply, fill #0

## 2024-02-08 MED ORDER — ACETAMINOPHEN 500 MG PO TABS: 1000.0000 mg | ORAL_TABLET | Freq: Once | ORAL | Status: DC

## 2024-02-08 MED ORDER — DEXAMETHASONE SODIUM PHOSPHATE 10 MG/ML IJ SOLN
INTRAMUSCULAR | Status: DC | PRN
  Administered 2024-02-08: 10 mg via INTRAVENOUS

## 2024-02-08 MED ORDER — BUPIVACAINE-EPINEPHRINE (PF) 0.25% -1:200000 IJ SOLN
INTRAMUSCULAR | Status: AC
  Filled 2024-02-08: qty 30

## 2024-02-08 MED ORDER — PROPOFOL 10 MG/ML IV BOLUS
INTRAVENOUS | Status: AC
  Filled 2024-02-08: qty 20

## 2024-02-08 MED ORDER — MIDAZOLAM HCL 2 MG/2ML IJ SOLN
INTRAMUSCULAR | Status: DC | PRN
  Administered 2024-02-08: 2 mg via INTRAVENOUS

## 2024-02-08 MED ORDER — TECHNETIUM TC 99M TILMANOCEPT KIT
0.3900 | PACK | Freq: Once | INTRAVENOUS | Status: AC | PRN
  Administered 2024-02-08: 0.39 via INTRADERMAL

## 2024-02-08 MED ORDER — DEXMEDETOMIDINE HCL IN NACL 80 MCG/20ML IV SOLN
INTRAVENOUS | Status: DC | PRN
  Administered 2024-02-08 (×2): 8 ug via INTRAVENOUS

## 2024-02-08 MED ORDER — METHYLENE BLUE (ANTIDOTE) 1 % IV SOLN
INTRAVENOUS | Status: DC | PRN
  Administered 2024-02-08: 1 mL via SUBMUCOSAL

## 2024-02-08 MED ORDER — PROPOFOL 10 MG/ML IV BOLUS
INTRAVENOUS | Status: DC | PRN
  Administered 2024-02-08: 150 mg via INTRAVENOUS
  Administered 2024-02-08: 50 mg via INTRAVENOUS

## 2024-02-08 MED ORDER — LIDOCAINE HCL 1 % IJ SOLN
INTRAMUSCULAR | Status: DC | PRN
  Administered 2024-02-08: 32 mL via INTRADERMAL

## 2024-02-08 MED ORDER — MIDAZOLAM HCL 2 MG/2ML IJ SOLN
INTRAMUSCULAR | Status: AC
  Filled 2024-02-08: qty 2

## 2024-02-08 MED ORDER — FENTANYL CITRATE (PF) 250 MCG/5ML IJ SOLN
INTRAMUSCULAR | Status: DC | PRN
  Administered 2024-02-08 (×2): 50 ug via INTRAVENOUS
  Administered 2024-02-08: 100 ug via INTRAVENOUS

## 2024-02-08 MED ORDER — ACETAMINOPHEN 500 MG PO TABS
1000.0000 mg | ORAL_TABLET | ORAL | Status: DC
  Filled 2024-02-08: qty 2

## 2024-02-08 MED ORDER — CHLORHEXIDINE GLUCONATE CLOTH 2 % EX PADS: 6.0000 | MEDICATED_PAD | Freq: Once | CUTANEOUS | Status: DC

## 2024-02-08 MED ORDER — CHLORHEXIDINE GLUCONATE 0.12 % MT SOLN
15.0000 mL | Freq: Once | OROMUCOSAL | Status: AC
  Administered 2024-02-08: 15 mL via OROMUCOSAL
  Filled 2024-02-08: qty 15

## 2024-02-08 MED ORDER — CEFAZOLIN SODIUM-DEXTROSE 2-4 GM/100ML-% IV SOLN
2.0000 g | INTRAVENOUS | Status: AC
  Administered 2024-02-08: 2 g via INTRAVENOUS
  Filled 2024-02-08: qty 100

## 2024-02-08 MED ORDER — PHENYLEPHRINE HCL-NACL 20-0.9 MG/250ML-% IV SOLN
INTRAVENOUS | Status: DC | PRN
  Administered 2024-02-08 (×2): 160 ug via INTRAVENOUS
  Administered 2024-02-08: 80 ug via INTRAVENOUS

## 2024-02-08 MED ORDER — METHYLENE BLUE (ANTIDOTE) 1 % IV SOLN
INTRAVENOUS | Status: AC
  Filled 2024-02-08: qty 10

## 2024-02-08 MED ORDER — ORAL CARE MOUTH RINSE: 15.0000 mL | Freq: Once | OROMUCOSAL | Status: AC

## 2024-02-08 MED ORDER — LIDOCAINE HCL 1 % IJ SOLN
INTRAMUSCULAR | Status: AC
  Filled 2024-02-08: qty 20

## 2024-02-08 MED ORDER — LACTATED RINGERS IV SOLN: INTRAVENOUS | Status: DC

## 2024-02-08 MED ORDER — ONDANSETRON HCL 4 MG/2ML IJ SOLN
INTRAMUSCULAR | Status: DC | PRN
  Administered 2024-02-08: 4 mg via INTRAVENOUS

## 2024-02-08 SURGICAL SUPPLY — 58 items
BAG COUNTER SPONGE SURGICOUNT (BAG) ×1 IMPLANT
BENZOIN TINCTURE PRP APPL 2/3 (GAUZE/BANDAGES/DRESSINGS) IMPLANT
BLADE SURG 10 STRL SS (BLADE) ×1 IMPLANT
BNDG COHESIVE 4X5 TAN STRL LF (GAUZE/BANDAGES/DRESSINGS) IMPLANT
BNDG GAUZE DERMACEA FLUFF 4 (GAUZE/BANDAGES/DRESSINGS) ×1 IMPLANT
CANISTER SUCT 3000ML PPV (MISCELLANEOUS) ×1 IMPLANT
CHLORAPREP W/TINT 26 (MISCELLANEOUS) ×1 IMPLANT
CLIP TI MEDIUM 24 (CLIP) ×1 IMPLANT
CLIP TI WIDE RED SMALL 24 (CLIP) ×1 IMPLANT
CNTNR URN SCR LID CUP LEK RST (MISCELLANEOUS) ×3 IMPLANT
COVER MAYO STAND STRL (DRAPES) IMPLANT
COVER PROBE W GEL 5X96 (DRAPES) ×1 IMPLANT
COVER SURGICAL LIGHT HANDLE (MISCELLANEOUS) ×1 IMPLANT
DERMABOND ADVANCED .7 DNX12 (GAUZE/BANDAGES/DRESSINGS) IMPLANT
DRAPE HALF SHEET 40X57 (DRAPES) ×1 IMPLANT
DRAPE LAPAROSCOPIC ABDOMINAL (DRAPES) ×1 IMPLANT
DRSG TEGADERM 4X4.75 (GAUZE/BANDAGES/DRESSINGS) ×2 IMPLANT
ELECT REM PT RETURN 9FT ADLT (ELECTROSURGICAL) ×1 IMPLANT
ELECTRODE REM PT RTRN 9FT ADLT (ELECTROSURGICAL) ×1 IMPLANT
GAUZE SPONGE 2X2 8PLY STRL LF (GAUZE/BANDAGES/DRESSINGS) ×1 IMPLANT
GLOVE BIO SURGEON STRL SZ 6 (GLOVE) ×1 IMPLANT
GLOVE INDICATOR 6.5 STRL GRN (GLOVE) ×1 IMPLANT
GOWN STRL REUS W/ TWL LRG LVL3 (GOWN DISPOSABLE) ×2 IMPLANT
GOWN STRL REUS W/ TWL XL LVL3 (GOWN DISPOSABLE) ×2 IMPLANT
KIT BASIN OR (CUSTOM PROCEDURE TRAY) ×1 IMPLANT
KIT TURNOVER KIT B (KITS) ×1 IMPLANT
LIGHT WAVEGUIDE WIDE FLAT (MISCELLANEOUS) IMPLANT
MARKER SKIN DUAL TIP RULER LAB (MISCELLANEOUS) ×1 IMPLANT
NDL 18GX1X1/2 (RX/OR ONLY) (NEEDLE) ×1 IMPLANT
NDL 22X1.5 STRL (OR ONLY) (MISCELLANEOUS) ×1 IMPLANT
NDL FILTER BLUNT 18X1 1/2 (NEEDLE) IMPLANT
NDL HYPO 25GX1X1/2 BEV (NEEDLE) ×1 IMPLANT
NEEDLE 18GX1X1/2 (RX/OR ONLY) (NEEDLE) ×1 IMPLANT
NEEDLE 22X1.5 STRL (OR ONLY) (MISCELLANEOUS) ×1 IMPLANT
NEEDLE FILTER BLUNT 18X1 1/2 (NEEDLE) ×1 IMPLANT
NEEDLE HYPO 25GX1X1/2 BEV (NEEDLE) ×1 IMPLANT
NS IRRIG 1000ML POUR BTL (IV SOLUTION) ×1 IMPLANT
PACK GENERAL/GYN (CUSTOM PROCEDURE TRAY) ×1 IMPLANT
PACK UNIVERSAL I (CUSTOM PROCEDURE TRAY) IMPLANT
PAD ARMBOARD POSITIONER FOAM (MISCELLANEOUS) ×2 IMPLANT
PENCIL SMOKE EVACUATOR (MISCELLANEOUS) ×1 IMPLANT
PUNCH BIOPSY 4MM (MISCELLANEOUS) ×1 IMPLANT
PUNCH BIOPSY DISP 4 (MISCELLANEOUS) IMPLANT
SPECIMEN JAR MEDIUM (MISCELLANEOUS) ×1 IMPLANT
SPIKE FLUID TRANSFER (MISCELLANEOUS) ×1 IMPLANT
STAPLER SKIN PROX WIDE 3.9 (STAPLE) IMPLANT
STOCKINETTE IMPERVIOUS 9X36 MD (GAUZE/BANDAGES/DRESSINGS) ×1 IMPLANT
STRIP CLOSURE SKIN 1/2X4 (GAUZE/BANDAGES/DRESSINGS) ×1 IMPLANT
SUT ETHILON 2 0 PSLX (SUTURE) ×1 IMPLANT
SUT MNCRL AB 4-0 PS2 18 (SUTURE) ×1 IMPLANT
SUT SILK 2 0 PERMA HAND 18 BK (SUTURE) ×1 IMPLANT
SUT VIC AB 2-0 SH 27X BRD (SUTURE) IMPLANT
SUT VIC AB 2-0 SH 27XBRD (SUTURE) ×2 IMPLANT
SUT VIC AB 3-0 SH 27X BRD (SUTURE) ×2 IMPLANT
SUT VIC AB 3-0 SH 8-18 (SUTURE) IMPLANT
SYR CONTROL 10ML LL (SYRINGE) ×2 IMPLANT
TOWEL GREEN STERILE (TOWEL DISPOSABLE) ×1 IMPLANT
TOWEL GREEN STERILE FF (TOWEL DISPOSABLE) ×1 IMPLANT

## 2024-02-08 NOTE — Op Note (Signed)
 PRE-OPERATIVE DIAGNOSIS: [id number] right upper arm melanoma, abdominal wall masses x 3, right upper arm skin lesion  POST-OPERATIVE DIAGNOSIS:  Same  PROCEDURE:  Procedure(s): Wide local excision 1-2 cm margins, advancement flap closure for defect 12 cm x 4 cm, right axillary sentinel lymph node mapping and biopsy, excision of abdominal wall masses x 3, punch biopsy right upper arm skin lesion.    SURGEON:  Surgeon(s): Almond Lint, MD  ASSIST:  Rockwell Germany, RNFA  ANESTHESIA:   local and general  DRAINS: none   LOCAL MEDICATIONS USED:  MARCAINE    and XYLOCAINE   SPECIMEN:  Source of Specimen:  LLQ abdominal wall mass, RLQ abdominal wall mass, right lateral upper abdominal wall mass, right upper arm skin biopsy, wide local excision right upper arm melanoma, two right axillary sentinel lymph nodes  FINDINGS:  LLQ abd wall mass 2 x 1 cm, firm, subcutaneous RLQ abd wall mass firm 3 x 2 cm, subcutaneous Right upper lateral abd wall mass fatty, subcutaneous, 3 x 2 cm Firm fleshy skin nodule 3 mm No gross residual melanoma seen SLN #1 blue and hot, cps 450 SLN #2 blue and hot cps 920   DISPOSITION OF SPECIMEN:  PATHOLOGY  COUNTS:  YES  PLAN OF CARE: Discharge to home after PACU  PATIENT DISPOSITION:  PACU - hemodynamically stable.    PROCEDURE:   Pt was identified in the holding area, taken to the OR, and placed supine on the OR table.  General anesthesia was induced.  Time out was performed according to the surgical safety checklist.  When all was correct, we continued.    The patient was placed into the supine position with the right arm out.  The abdominal wall, right breast, and right upper arm were prepped and draped in sterile fashion.  The melanoma was identified and 1-2 cm margins were marked out.  One mL methylene blue was injected intradermally around the melanoma biopsy site.    The left lower quadrant lesion was addressed first.  Local anesthetic was infiltrated under  the skin and a 2 cm transverse incision was made over the top of this.  The cautery was used to dissect around the mass and the mass was elevated with a Babcock.  Once the specimen was completely dissected away it was passed off the table.  The wound was irrigated.  Hemostasis was achieved with cautery.  The skin was closed using interrupted 3-0 Vicryl deep dermal suture and running 4-0 Monocryl subcuticular suture.  This was dressed with Dermabond.  The right lower quadrant mass was similar.  This 1 was larger so required a 3 cm incision.  Both of these lower abdominal lesions were very firm and consistent with fat necrosis from previous injections.  The right upper lateral abdominal wall mass was then addressed.  A slightly obliquely oriented 4 cm incision was made in this location.  This mass was still subcutaneous, but was right on top of the fascia.  It was dissected away and irrigated and closed in similar fashion.  The right upper arm skin lesion was addressed next.  Local anesthetic was infiltrated underneath the lesion and a 4 mm punch biopsy was performed.  This was closed with a single 4-0 Monocryl subcuticular suture.  The melanoma was then addressed.    Local was administered under the melanoma and the adjacent tissue.  A #10 blade was used to incise the skin around the melanoma.  The cautery was used to take the dissection down  to the fascia.  The skin was marked in situ with orientation sutures.  The cautery was used to take the specimen off the fascia, and it was passed off the table.    Skin hooks were used to elevate the edges of the incision and the skin was freed up in all directions.  This was pulled together in a longitudinal orientation. The skin was pulled together to check the tension. . Deep interrupted 2-0 vicryl sutures were placed to relieve tension.  The skin was then reapproximated with 3-0 interrupted vicryl deep dermal sutures and 4-0 monocryl running subcuticular sutures.   Three 2-0 nylon horizontal mattress sutures were placed as well.    The right axilla was then addressed.  The point of maximum signal intensity was identified with the neoprobe.  A 4 cm incision was made with a #15 blade.  The subcutaneous tissues were divided with the cautery.  A Weitlaner retractor was used to assist with visualization.  The tonsil clamp was used to bluntly dissect the axillary fat pad.  Two deep right axillary sentinel lymph nodes were identified as described above.  The lymphovascular channels were clipped with hemoclips.  The nodes were passed off as specimens.  Hemostasis was achieved with the cautery.  The axilla was irrigated and closed with 3-0 Vicryl deep dermal interrupted sutures and 4-0 Monocryl running subcuticular suture.  The axilla was dressed with dermabond.    The melanoma site was cleaned, dried, and dressed with Benzoin, steristrips, gauze, and tegaderm.    Needle, sponge, and instrument counts were correct.  The patient was awakened from anesthesia and taken to the PACU in stable condition.

## 2024-02-08 NOTE — Anesthesia Procedure Notes (Signed)
 Procedure Name: LMA Insertion Date/Time: 02/08/2024 10:54 AM  Performed by: Randon Goldsmith, CRNAPre-anesthesia Checklist: Patient identified, Emergency Drugs available, Suction available and Patient being monitored Patient Re-evaluated:Patient Re-evaluated prior to induction Oxygen Delivery Method: Circle system utilized Preoxygenation: Pre-oxygenation with 100% oxygen Induction Type: IV induction Ventilation: Mask ventilation without difficulty LMA: LMA inserted LMA Size: 4.0 Number of attempts: 1 Airway Equipment and Method: Bite block Placement Confirmation: breath sounds checked- equal and bilateral Tube secured with: Tape Dental Injury: Teeth and Oropharynx as per pre-operative assessment

## 2024-02-08 NOTE — Discharge Instructions (Signed)
[facility] Office Phone Number [phone]   POST OP INSTRUCTIONS  Always review your discharge instruction sheet given to you by the facility where your surgery was performed.  IF YOU HAVE DISABILITY OR FAMILY LEAVE FORMS, YOU MUST BRING THEM TO THE OFFICE FOR PROCESSING.  DO NOT GIVE THEM TO YOUR DOCTOR.  Take 2 tylenol (acetominophen) three times a day for 3 days.  If you still have pain, add ibuprofen with food in between if able to take this (if you have kidney issues or stomach issues, do not take ibuprofen).  If both of those are not enough, add the narcotic pain pill.  If you find you are needing a lot of this overnight after surgery, call the next morning for a refill.   Take your usually prescribed medications unless otherwise directed If you need a refill on your pain medication, please contact your pharmacy.  They will contact our office to request authorization.  Prescriptions will not be filled after 5pm or on week-ends. You should eat very light the first 24 hours after surgery, such as soup, crackers, pudding, etc.  Resume your normal diet the day after surgery It is common to experience some constipation if taking pain medication after surgery.  Increasing fluid intake and taking a stool softener will usually help or prevent this problem from occurring.  A mild laxative (Milk of Magnesia or Miralax) should be taken according to package directions if there are no bowel movements after 48 hours. You may shower in 48 hours.  The surgical glue will flake off in 2-3 weeks.   ACTIVITIES:  No strenuous activity or heavy lifting for 2 weeks You may drive when you no longer are taking prescription pain medication, you can comfortably wear a seatbelt, and you can safely maneuver your car and apply brakes. RETURN TO WORK:  __________to be determined. _______________ Paige Lozano should see your doctor in the office for a follow-up appointment approximately three-four weeks after your  surgery.    WHEN TO CALL YOUR DOCTOR: Fever over 101.0 Nausea and/or vomiting. Extreme swelling or bruising. Continued bleeding from incision. Increased pain, redness, or drainage from the incision.  The clinic staff is available to answer your questions during regular business hours.  Please don't hesitate to call and ask to speak to one of the nurses for clinical concerns.  If you have a medical emergency, go to the nearest emergency room or call 911.  A surgeon from [facility] is always on call at the hospital.  For further questions, please visit [email or web address]

## 2024-02-08 NOTE — Interval H&P Note (Signed)
 History and Physical Interval Note:  02/08/2024 10:35 AM  [name]  has presented today for surgery, with the diagnosis of MELONOMA RIGHT UPPER ARM, skin lesion right arm,  ABDOMINAL WALL MASS X3.  The various methods of treatment have been discussed with the patient and family. After consideration of risks, benefits and other options for treatment, the patient has consented to  Procedure(s) with comments: PROCEDURE- RIGHT UPPER ARM WIDE LOCAL EXCISION of right arm melanoma with ADVANCEMENT FLAP CLOSURE, SENTINEL NODE BIOPSY, EXCISION OF 3 ABDOMINAL WALL MASSES, biopsy skin lesion right arm as a surgical intervention.  The patient's history has been reviewed, patient examined, no change in status, stable for surgery.  I have reviewed the patient's chart and labs.  Questions were answered to the patient's satisfaction.     Paige Lozano

## 2024-02-08 NOTE — Transfer of Care (Signed)
 Immediate Anesthesia Transfer of Care Note  Patient: Paige Lozano  Procedure(s) Performed: PROCEDURE, ADVANCEMENT FLAP, ABDOMEN BIOPSY, LYMPH NODE, SENTINEL EXCISION, NEOPLASM, ABDOMINAL WALL EXCISION, MASS, TORSO  Patient Location: PACU  Anesthesia Type:General  Level of Consciousness: drowsy  Airway & Oxygen Therapy: Patient Spontanous Breathing  Post-op Assessment: Report given to RN and Post -op Vital signs reviewed and stable  Post vital signs: Reviewed and stable  Last Vitals:  Vitals Value Taken Time  BP 141/71 02/08/24 1303  Temp    Pulse 66 02/08/24 1305  Resp 16 02/08/24 1305  SpO2 98 % 02/08/24 1305  Vitals shown include unfiled device data.  Last Pain:  Vitals:         Complications: No notable events documented.

## 2024-02-08 NOTE — Anesthesia Postprocedure Evaluation (Signed)
 Anesthesia Post Note  Patient: Paige Lozano  Procedure(s) Performed: PROCEDURE, ADVANCEMENT FLAP, ABDOMEN BIOPSY, LYMPH NODE, SENTINEL EXCISION, NEOPLASM, ABDOMINAL WALL EXCISION, MASS, TORSO     Patient location during evaluation: PACU Anesthesia Type: General Level of consciousness: awake and alert Pain management: pain level controlled Vital Signs Assessment: post-procedure vital signs reviewed and stable Respiratory status: spontaneous breathing, nonlabored ventilation and respiratory function stable Cardiovascular status: blood pressure returned to baseline Postop Assessment: no apparent nausea or vomiting Anesthetic complications: no   No notable events documented.  Last Vitals:  Vitals:   02/08/24 1430 02/08/24 1436  BP: (!) 145/74   Pulse: 62 64  Resp: 12 11  Temp:  36.4 C  SpO2: 97% 97%    Last Pain:  Vitals:   02/08/24 1330  PainSc: Asleep                 Shanda Howells

## 2024-02-09 ENCOUNTER — Encounter (HOSPITAL_COMMUNITY): Payer: Self-pay | Admitting: General Surgery

## 2024-02-15 ENCOUNTER — Telehealth: Payer: Self-pay | Admitting: General Surgery

## 2024-02-15 LAB — SURGICAL PATHOLOGY

## 2024-02-15 NOTE — Telephone Encounter (Signed)
 Discussed path with patient. No residual invasive disease, negative lymph nodes, negative margins, all other masses/biopsies benign.

## 2024-02-22 DIAGNOSIS — G35 Multiple sclerosis: Secondary | ICD-10-CM | POA: Diagnosis not present

## 2024-02-24 ENCOUNTER — Other Ambulatory Visit (HOSPITAL_COMMUNITY): Payer: Self-pay | Admitting: General Surgery

## 2024-02-24 DIAGNOSIS — C4361 Malignant melanoma of right upper limb, including shoulder: Secondary | ICD-10-CM

## 2024-03-13 ENCOUNTER — Encounter (HOSPITAL_COMMUNITY): Admission: RE | Admit: 2024-03-13 | Source: Ambulatory Visit

## 2024-03-14 ENCOUNTER — Ambulatory Visit (HOSPITAL_COMMUNITY)
Admission: RE | Admit: 2024-03-14 | Discharge: 2024-03-14 | Disposition: A | Discharge status: Home / Self Care | Source: Ambulatory Visit | Attending: General Surgery | Admitting: General Surgery

## 2024-03-14 DIAGNOSIS — C4361 Malignant melanoma of right upper limb, including shoulder: Secondary | ICD-10-CM | POA: Diagnosis not present

## 2024-03-14 LAB — GLUCOSE, CAPILLARY: Glucose-Capillary: 87 mg/dL (ref 70–99)

## 2024-03-14 MED ORDER — FLUDEOXYGLUCOSE F - 18 (FDG) INJECTION
7.7000 | Freq: Once | INTRAVENOUS | Status: AC
  Administered 2024-03-14: 7.7 via INTRAVENOUS

## 2024-03-15 ENCOUNTER — Other Ambulatory Visit: Payer: Self-pay | Admitting: Physician Assistant

## 2024-03-15 ENCOUNTER — Telehealth: Payer: Self-pay

## 2024-03-15 DIAGNOSIS — F411 Generalized anxiety disorder: Secondary | ICD-10-CM

## 2024-03-15 MED ORDER — CLONAZEPAM 0.5 MG PO TABS: 0.5000 mg | ORAL_TABLET | Freq: Two times a day (BID) | ORAL | 0 refills | Status: DC

## 2024-03-15 NOTE — Telephone Encounter (Signed)
 Copied from CRM [id number]. Topic: Clinical - Medication Refill >> Mar 15, 2024  9:57 AM Retta Caster wrote: Medication: clonazePAM  (KLONOPIN ) 0.5 MG tablet   Has the patient contacted their pharmacy? Yes (Agent: If no, request that the patient contact the pharmacy for the refill. If patient does not wish to contact the pharmacy document the reason why and proceed with request.) (Agent: If yes, when and what did the pharmacy advise?)  This is the patient's preferred pharmacy:  [id number] - [place], [place] - [address] AT [facility] & [address] [place] [address] Phone: [phone] Fax: [phone]   Is this the correct pharmacy for this prescription? Yes If no, delete pharmacy and type the correct one.   Has the prescription been filled recently? No  Is the patient out of the medication? Yes  Has the patient been seen for an appointment in the last year OR does the patient have an upcoming appointment? Yes  Can we respond through MyChart? Yes  Agent: Please be advised that Rx refills may take up to 3 business days. We ask that you follow-up with your pharmacy.

## 2024-03-15 NOTE — Telephone Encounter (Signed)
 Copied from CRM [id number]. Topic: Clinical - Prescription Issue >> Mar 15, 2024  2:38 PM Danelle Dunning F wrote: Reason for CRM:   Caller: Sherian Dimitri   Calling From: [id number] - [place], [place] - [address] AT [facility] [address] [place] [address] [place] [address] Phone: [phone]  Fax: [phone]    Reason for Call: The pharmacy is calling due to requesting a refill for the patient's clonazePAM  (KLONOPIN ) 0.5 MG tablet since 03/10/2024 and hearing nothing back. Patient reached out herself this morning to our office in an attempt to have her prescription refilled and was quoted the 3 business days for the refill turn around; Agent called over to [facility] in hopes of assisting the patient with getting her prescription; When agent quoted the 3 business day turn around to the pharmacist the pharmacist that she didn't want to contact the patient in fear of being yelled at.   Please contact patient and explain the delay on prescription refill.

## 2024-03-15 NOTE — Telephone Encounter (Signed)
 Clonazepam  prescription refill shows in patient chart as was filled today.

## 2024-03-17 DIAGNOSIS — G35 Multiple sclerosis: Secondary | ICD-10-CM | POA: Diagnosis not present

## 2024-03-17 DIAGNOSIS — M50321 Other cervical disc degeneration at C4-C5 level: Secondary | ICD-10-CM | POA: Diagnosis not present

## 2024-03-17 DIAGNOSIS — R479 Unspecified speech disturbances: Secondary | ICD-10-CM | POA: Diagnosis not present

## 2024-03-17 DIAGNOSIS — R413 Other amnesia: Secondary | ICD-10-CM | POA: Diagnosis not present

## 2024-03-17 DIAGNOSIS — M50323 Other cervical disc degeneration at C6-C7 level: Secondary | ICD-10-CM | POA: Diagnosis not present

## 2024-03-17 DIAGNOSIS — M50322 Other cervical disc degeneration at C5-C6 level: Secondary | ICD-10-CM | POA: Diagnosis not present

## 2024-03-17 DIAGNOSIS — M47814 Spondylosis without myelopathy or radiculopathy, thoracic region: Secondary | ICD-10-CM | POA: Diagnosis not present

## 2024-03-22 DIAGNOSIS — G35 Multiple sclerosis: Secondary | ICD-10-CM | POA: Diagnosis not present

## 2024-03-22 DIAGNOSIS — R200 Anesthesia of skin: Secondary | ICD-10-CM | POA: Diagnosis not present

## 2024-03-22 DIAGNOSIS — R5383 Other fatigue: Secondary | ICD-10-CM | POA: Diagnosis not present

## 2024-03-22 DIAGNOSIS — R413 Other amnesia: Secondary | ICD-10-CM | POA: Diagnosis not present

## 2024-03-30 DIAGNOSIS — G35 Multiple sclerosis: Secondary | ICD-10-CM | POA: Diagnosis not present

## 2024-04-07 DIAGNOSIS — C4361 Malignant melanoma of right upper limb, including shoulder: Secondary | ICD-10-CM | POA: Diagnosis not present

## 2024-04-07 DIAGNOSIS — I890 Lymphedema, not elsewhere classified: Secondary | ICD-10-CM | POA: Diagnosis not present

## 2024-04-14 ENCOUNTER — Ambulatory Visit (INDEPENDENT_AMBULATORY_CARE_PROVIDER_SITE_OTHER): Admitting: Physician Assistant

## 2024-04-14 VITALS — BP 125/64 | HR 90 | Ht 68.5 in | Wt 150.1 lb

## 2024-04-14 DIAGNOSIS — Z716 Tobacco abuse counseling: Secondary | ICD-10-CM

## 2024-04-14 DIAGNOSIS — C4361 Malignant melanoma of right upper limb, including shoulder: Secondary | ICD-10-CM | POA: Insufficient documentation

## 2024-04-14 DIAGNOSIS — F411 Generalized anxiety disorder: Secondary | ICD-10-CM

## 2024-04-14 DIAGNOSIS — J432 Centrilobular emphysema: Secondary | ICD-10-CM

## 2024-04-14 MED ORDER — CLONAZEPAM 0.5 MG PO TABS: 0.5000 mg | ORAL_TABLET | Freq: Two times a day (BID) | ORAL | 5 refills | Status: DC

## 2024-04-14 MED ORDER — BREZTRI AEROSPHERE 160-9-4.8 MCG/ACT IN AERO: 2.0000 | INHALATION_SPRAY | Freq: Two times a day (BID) | RESPIRATORY_TRACT | 5 refills | Status: DC

## 2024-04-14 NOTE — Patient Instructions (Addendum)
 Referral made to see if we can get chantix  covered Airsupra as needed for rescue Breztri 2 puffs twice a day

## 2024-04-14 NOTE — Progress Notes (Signed)
 Established Patient Office Visit  Subjective   Patient ID: Paige Lozano, female    DOB: [birth year]  Age: 58 y.o. MRN: [id number]  Chief Complaint  Patient presents with   Medical Management of Chronic Issues    medication check and health concerns Patient requested that she get a PRINTED SCRIPT FOR CLONAZEPAM  0.5 MG just for today.     HPI Pt is a 58 yo female who presents to the clinic for medication follow up.   She has been dx with melanoma and treated by dermatology. She did have PET scan with no metastatic disease seen but she did have emphysema. She admits to having intermittent SOB, chronic cough and chest tightness. She does not have rescue inhaler currently. She does want to stop vaping. She vapes to replace smoking but she is vaping all the time. She even wakes up in the middle of the night to vape. Chantix  has worked well but but she has not been able to afford it right now.   Her anxiety is there but controlled. She does need klonapin today.   .. Active Ambulatory Problems    Diagnosis Date Noted   Multiple sclerosis (HCC) 05/29/2016   Fibromyalgia 05/29/2016   Osteoarthritis of thumb 05/29/2016   Insomnia 05/29/2016   Ulcerative pancolitis without complication (HCC) 05/29/2016   Bipolar 2 disorder (HCC) 05/29/2016   Nicotine  dependence 05/29/2016   Primary osteoarthritis of right knee with recent ACL tear and femoral condylar impaction fracture 06/01/2016   Anxiety state 06/01/2016   Left shoulder pain 06/01/2016   GAD (generalized anxiety disorder) 07/20/2016   History of pulmonary embolus (PE) 07/21/2016   Right elbow pain 07/21/2016   Hot flashes 12/29/2016   Body aches 12/29/2016   Pruritus 12/29/2016   Palpitations 03/08/2017   Chest tightness 03/08/2017   Tachycardia 03/08/2017   Sinus tachycardia 03/12/2017   PAC (premature atrial contraction) 03/12/2017   Lumbar degenerative disc disease 06/02/2017   Plantar fasciitis, bilateral 10/29/2017    Onychomycosis 02/17/2018   Neurodermatitis 03/29/2018   Subcutaneous mass 04/07/2018   Family history of breast cancer 12/02/2018   Acute stress reaction 03/13/2019   Fracture of second and third metatarsals of the right foot 07/14/2019   Left wrist injury 07/31/2019   Thyroid  disorder screen 09/04/2019   Actinic keratoses 09/04/2019   Abrasion 11/22/2019   History of lipoma 11/22/2019   Air hunger 11/28/2020   SOB (shortness of breath) 11/28/2020   Right bundle branch block 11/28/2020   Ulcerative colitis (HCC)    Sleep apnea    PONV (postoperative nausea and vomiting)    Pulmonary embolism (HCC) 2000   COPD (chronic obstructive pulmonary disease) (HCC)    Complication of anesthesia    Bipolar 2 disorder, major depressive episode (HCC)    Anxiety    Cervical radiculopathy 12/13/2020   Inflamed sebaceous cyst 02/11/2021   Raynaud's phenomenon without gangrene 08/29/2021   Stress at home 08/29/2021   Pain of left middle finger 11/28/2021   Stenosing tenosynovitis of finger of right hand 11/28/2021   Lipoma of torso 03/02/2022   Injury of left wrist 03/24/2022   Left wrist pain 03/24/2022   Traumatic closed nondisplaced metaphyseal torus fracture of distal radius with routine healing 03/24/2022   Complex tear of triangular fibrocartilage of left wrist 03/24/2022   Abnormal uterine bleeding 05/29/2014   Adjustment disorder with depressed mood 06/05/2014   Carpal tunnel syndrome, right 08/22/2014   Closed nondisplaced fracture of base of first metacarpal bone of  left hand with routine healing 01/13/2017   Complex tear of medial meniscus of right knee as current injury 05/16/2020   Deviated nasal septum 01/14/2016   Diarrhea 09/27/2015   Diffuse myofascial pain syndrome 04/18/2014   Disorder of bone and cartilage 06/19/2018   Former smoker 06/19/2018   Hypertrophy of inferior nasal turbinate 01/14/2016   Impaired gait and mobility 02/19/2023   Iron deficiency 10/01/2015    Chronic pain of right knee 01/08/2023   Low back pain radiating to both legs 12/27/2014   Nonunion of osteotomy site 04/04/2014   Other reduced mobility 01/08/2023   Perimenopausal 06/19/2018   Silent sinus syndrome 02/11/2016   S/P right knee arthroscopy 11/21/2018   Third degree hemorrhoids 09/27/2015   Weakness of right leg 01/08/2023   Skin mass 08/30/2023   Osteopenia 10/27/2023   Arthralgia of multiple joints 11/01/2023   Polymyalgia (HCC) 11/01/2023   Mass of lower outer quadrant of left breast 11/01/2023   Peripheral neuropathy 11/01/2023   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    Objective:     BP 125/64   Pulse 90   Ht 5' 8.5" (1.74 m)   Wt 150 lb 1.6 oz (68.1 kg)   LMP  (LMP Unknown)   SpO2 95%   BMI 22.49 kg/m  BP Readings from Last 3 Encounters:  04/14/24 125/64  02/08/24 (!) 145/74  02/07/24 135/83   Wt Readings from Last 3 Encounters:  04/14/24 150 lb 1.6 oz (68.1 kg)  03/14/24 151 lb (68.5 kg)  02/08/24 156 lb (70.8 kg)      ..    04/14/2024   12:13 PM 09/21/2023   10:15 AM 08/30/2023   11:33 AM 01/01/2023    3:29 PM 09/18/2022   10:09 AM  Depression screen PHQ 2/9  Decreased Interest 1 0 3 0 3  Down, Depressed, Hopeless 1 0 3 0 3  PHQ - 2 Score 2 0 6 0 6  Altered sleeping 3  2  2   Tired, decreased energy 3  2  3   Change in appetite 3  1  3   Feeling bad or failure about yourself  0  0  1  Trouble concentrating 1  1  1   Moving slowly or fidgety/restless 1  1  0  Suicidal thoughts 0  0  0  PHQ-9 Score 13   13  16   Difficult doing work/chores Somewhat difficult  Not difficult at all  Very difficult   ...    04/14/2024   12:13 PM 08/30/2023   11:33 AM 11/25/2021    3:24 PM 03/08/2019   10:02 AM  GAD 7 : Generalized Anxiety Score  Nervous, Anxious, on Edge 1 3 2 3   Control/stop worrying 1 3 2 3   Worry too much - different things 1 3 2 3   Trouble relaxing 1 3 2 3   Restless 1 0 0 3  Easily annoyed or irritable 1 1 2 3   Afraid - awful might happen 1 1 0 0  Total GAD 7  Score 7 14 10 18   Anxiety Difficulty Somewhat difficult Extremely difficult Somewhat difficult Very difficult     Physical Exam Constitutional:      Appearance: Normal appearance.  HENT:     Head: Normocephalic.  Cardiovascular:     Rate and Rhythm: Normal rate and regular rhythm.  Pulmonary:     Effort: Pulmonary effort is normal.     Breath sounds: Normal breath sounds.  Musculoskeletal:     Right lower leg: No edema.     Left lower leg: No edema.  Neurological:     General: No focal deficit present.     Mental Status: She is alert and oriented to person, place, and time.  Psychiatric:        Mood and Affect: Mood normal.         Assessment & Plan:  Aaron AasAaron AasIlo was seen today for medical management of chronic issues.  Diagnoses and all orders for this visit:  Centrilobular emphysema (HCC) -     budesonide-glycopyrrolate -formoterol (BREZTRI  AEROSPHERE) 160-9-4.8 MCG/ACT AERO inhaler; Inhale 2 puffs into the lungs 2 (two) times daily. -     Albuterol -Budesonide (AIRSUPRA) 90-80 MCG/ACT AERO; Inhale 2 puffs into the lungs every 6 (six) hours as needed.  Anxiety state -     clonazePAM  (KLONOPIN ) 0.5 MG tablet; Take 1 tablet (0.5 mg total) by mouth in the morning and at bedtime.  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    Taksh Hjort, PA-C

## 2024-04-17 ENCOUNTER — Encounter: Payer: Self-pay | Admitting: Physician Assistant

## 2024-04-17 MED ORDER — AIRSUPRA 90-80 MCG/ACT IN AERO: 2.0000 | INHALATION_SPRAY | Freq: Four times a day (QID) | RESPIRATORY_TRACT | 1 refills | Status: DC | PRN

## 2024-04-20 ENCOUNTER — Telehealth: Payer: Self-pay

## 2024-04-20 NOTE — Telephone Encounter (Signed)
 Copied from CRM [id number]. Topic: Clinical - Prescription Issue >> Apr 20, 2024 11:35 AM Paige Lozano F wrote: Reason for CRM:   Concern: The patient called in to discuss getting assistance with her smoking avoidance medication( possibly Varenicline ); Patient only has a few left of chantix  medication and is unclear if they are too old to take; Patient was informed from her PCP that she will have someone reach out to assist with the matter of coverage; Patient is on disability and unable to cover the medication cost;  Patient is checking in to receive an update on the callback.    Contact information:   Phone: [phone]

## 2024-04-21 ENCOUNTER — Telehealth: Admitting: Physician Assistant

## 2024-04-21 ENCOUNTER — Ambulatory Visit: Payer: Self-pay

## 2024-04-21 ENCOUNTER — Other Ambulatory Visit: Payer: Self-pay

## 2024-04-21 DIAGNOSIS — L0390 Cellulitis, unspecified: Secondary | ICD-10-CM | POA: Diagnosis not present

## 2024-04-21 MED ORDER — CEPHALEXIN 500 MG PO CAPS: 500.0000 mg | ORAL_CAPSULE | Freq: Four times a day (QID) | ORAL | 0 refills | Status: DC

## 2024-04-21 NOTE — Telephone Encounter (Signed)
 Copied from CRM [id number]. Topic: Clinical - Red Word Triage >> Apr 21, 2024 10:37 AM Blair Bumpers wrote: Red Word that prompted transfer to Nurse Triage: Patient states she was recommended by the pharmacist to call her provider. She states she's having an allergic reaction to a shot that she took last week. Pharmacist told her to put cold compresses on it, which she has, & she also circled it. Patient states it's the size of her palm. Pharmacist told her that maybe Dr. Lindaann Requena can call her something in.

## 2024-04-21 NOTE — Progress Notes (Signed)
   04/21/2024  Patient ID: Paige Lozano, female   DOB: February 22, 1966, 58 y.o.   MRN: [id number]  Subjective/Objective Patient outreach to assist with affordability of Chantix  in response to referral placed by PCP, Sandy Crumb, PA  Smoking Cessation -Current medications:  Chantix  1mg  daily -Patient recently restarted Chantix  taper with tablets on hand from previous smoking cessation attempt and is currently at 1mg  daily does -Endorses having 6 tablets left and expresses concern with affordability of refill -Previous attempt of smoking cessation with Chantix  was successful until patient began vaping somewhat recently -Desire to quit to improve health and ability to exercise  -Recent PET scan diagnosed emphysema   Assessment/Plan  Smoking Cessation -Test claim reflects $49 copay for 1 month supply of varenecline 1mg  BID (maintenance dose) -GoodRx could make medication $45 at [facility], [facility] Cost is $48 -Unfortunately, no PAP or disease state grant is available for this medication -I will contact insurance to see about tier reduction, patient's deductible, and prescription payment plan options  Follow-up:  Monday 2pm to discuss options to assist with affordability  Linn Rich, PharmD, DPLA

## 2024-04-21 NOTE — Telephone Encounter (Signed)
 FYI Only or Action Required?: FYI only for provider  Patient was last seen in primary care on 04/14/2024 by Araceli Knight, PA-C. Called Nurse Triage reporting injection reaction. Symptoms began several days ago. Interventions attempted: OTC medications: zyrtec and Ice/heat application. Symptoms are: unchanged.  Triage Disposition: Call PCP Within 24 Hours  Patient/caregiver understands and will follow disposition?: Yes        Copied from CRM [id number]. Topic: Clinical - Red Word Triage >> Apr 21, 2024 10:37 AM Paige Lozano wrote: Red Word that prompted transfer to Nurse Triage: Patient states she was recommended by the pharmacist to call her provider. She states she's having an allergic reaction to a shot that she took last week. Pharmacist told her to put cold compresses on it, which she has, & she also circled it. Patient states it's the size of her palm. Pharmacist told her that maybe Dr. Lindaann Lozano can call her something in.    Reason for Disposition  [1] Redness or red streak around the injection site AND [2] begins > 48 hours after shot AND [3] no fever  (Exception: Red area < 1 inch or 2.5 cm wide.)  Answer Assessment - Initial Assessment Questions 1. SYMPTOMS: What is the main symptom? (e.g., redness, swelling, pain)      Redness, itching, warmth 2. ONSET: When was the vaccine (shot) given? How much later did the  warmth begin? (e.g., hours, days ago)      7 days 3. SEVERITY: How bad is it?      mod 4. FEVER: Is there a fever? If Yes, ask: What is it, how was it measured, and when did it start?      no 5. IMMUNIZATIONS GIVEN: What shots have you recently received?     Pneumonia  6. PAST REACTIONS: Have you reacted to immunizations before? If Yes, ask: What happened?     no 7. OTHER SYMPTOMS: Do you have any other symptoms?    no  Protocols used: Immunization Reactions-A-AH

## 2024-04-21 NOTE — Patient Instructions (Signed)
 Paige Lozano, thank you for joining Angelia Kelp, PA-C for today's virtual visit.  While this provider is not your primary care provider (PCP), if your PCP is located in our provider database this encounter information will be shared with them immediately following your visit.   A [facility] MyChart account gives you access to today's visit and all your visits, tests, and labs performed at [facility]  click here if you don't have a [facility] MyChart account or go to [email or web address]  Consent: (Patient) Paige Lozano provided verbal consent for this virtual visit at the beginning of the encounter.  Current Medications:  Current Outpatient Medications:    cephALEXin (KEFLEX) 500 MG capsule, Take 1 capsule (500 mg total) by mouth 4 (four) times daily., Disp: 20 capsule, Rfl: 0   acetaminophen  (TYLENOL ) 325 MG tablet, Take 500 mg by mouth every 6 (six) hours as needed., Disp: , Rfl:    albuterol  (VENTOLIN  HFA) 108 (90 Base) MCG/ACT inhaler, INHALE 2 PUFFS INTO THE LUNGS EVERY 6 HOURS AS NEEDED FOR WHEEZING OR SHORTNESS OF BREATH, Disp: 6.7 g, Rfl: 0   Albuterol -Budesonide (AIRSUPRA ) 90-80 MCG/ACT AERO, Inhale 2 puffs into the lungs every 6 (six) hours as needed., Disp: 32.1 g, Rfl: 1   amphetamine-dextroamphetamine (ADDERALL) 5 MG tablet, Take 5 mg by mouth every morning., Disp: , Rfl:    budesonide-glycopyrrolate -formoterol (BREZTRI  AEROSPHERE) 160-9-4.8 MCG/ACT AERO inhaler, Inhale 2 puffs into the lungs 2 (two) times daily., Disp: 10.7 g, Rfl: 5   Calcium Carb-Cholecalciferol (CALCIUM 500 +D PO), Take 1 tablet by mouth daily., Disp: , Rfl:    Cholecalciferol 25 MCG (1000 UT) tablet, Take 1,000 Units by mouth daily., Disp: , Rfl:    clonazePAM  (KLONOPIN ) 0.5 MG tablet, Take 1 tablet (0.5 mg total) by mouth in the morning and at bedtime., Disp: 60 tablet, Rfl: 5   DULoxetine  (CYMBALTA ) 30 MG capsule, Take 1 capsule (30 mg total) by mouth daily., Disp: 30 capsule,  Rfl: 1   lamoTRIgine  (LAMICTAL ) 200 MG tablet, Take 1 tablet (200 mg total) by mouth 2 (two) times daily., Disp: 180 tablet, Rfl: 1   methocarbamol  (ROBAXIN ) 500 MG tablet, TAKE 1 TABLET THREE TIMES DAILY, Disp: 270 tablet, Rfl: 3   Multiple Vitamin (MULTI-VITAMIN) tablet, Take 1 tablet by mouth daily., Disp: , Rfl:    natalizumab  (TYSABRI ) 300 MG/15ML injection, Inject into the vein., Disp: , Rfl:    QUEtiapine  (SEROQUEL ) 200 MG tablet, TAKE 1 TABLET AT BEDTIME, Disp: 30 tablet, Rfl: 1   varenicline  (CHANTIX ) 1 MG tablet, Take 1 mg by mouth daily., Disp: , Rfl:    vitamin B-12 (CYANOCOBALAMIN) 1000 MCG tablet, Take 1,000 mcg by mouth daily., Disp: , Rfl:    Medications ordered in this encounter:  Meds ordered this encounter  Medications   cephALEXin (KEFLEX) 500 MG capsule    Sig: Take 1 capsule (500 mg total) by mouth 4 (four) times daily.    Dispense:  20 capsule    Refill:  0    Supervising Provider:   Corine Dice [[id number]]     *If you need refills on other medications prior to your next appointment, please contact your pharmacy*  Follow-Up: Call back or seek an in-person evaluation if the symptoms worsen or if the condition fails to improve as anticipated.  [facility] [phone]  Other Instructions  Cellulitis, Adult  Cellulitis is a skin infection. The infected area is usually warm, red, swollen, and tender. It most commonly  occurs on the lower body, such as the legs, feet, and toes, but this condition can occur on any part of the body. The infection can travel to the muscles, blood, and underlying tissue and become life-threatening without treatment. It is important to get medical treatment right away for this condition. What are the causes? Cellulitis is caused by bacteria. The bacteria enter through a break in the skin, such as a cut, burn, insect or animal bite, open sore, or crack. What increases the risk? This condition is more likely to occur in  people who: Have a weak body's defense system (immune system). Are older than 58 years old. Have diabetes. Have a type of long-term (chronic) liver disease (cirrhosis) or kidney disease. Are obese. Have a skin condition such as: An itchy rash, such as eczema or psoriasis. A fungal rash on the feet or in skinfolds. Blistering rashes, such as shingles or chickenpox. Slow movement of blood in the veins (venous stasis). Fluid buildup below the skin (edema). Have open wounds on the skin, such as cuts, puncture wounds, burns, bites, scrapes, tattoos, piercings, or wounds from surgery. Have had radiation therapy. Use IV drugs. What are the signs or symptoms? Symptoms of this condition include: Skin that looks red, purple, or slightly darker than your usual skin color. Streaks or spots on the skin. Swollen area of the skin. Tenderness or pain when an area of the skin is touched. Warm skin. Fever or chills. Blisters. Tiredness (fatigue). How is this diagnosed? This condition is diagnosed based on a medical history and physical exam. You may also have tests, including: Blood tests. Imaging tests. Tests on a sample of fluid taken from the wound (wound culture). How is this treated? Treatment for this condition may include: Medicines. These may include antibiotics or medicines to treat allergies (antihistamines). Rest. Applying cold or warm wet cloths (compresses) to the skin. If the condition is severe, you may need to stay in the hospital and get antibiotics through an IV. The infection usually starts to get better within 1-2 days of treatment. Follow these instructions at home: Medicines Take over-the-counter and prescription medicines only as told by your health care provider. If you were prescribed antibiotics, take them as told by your provider. Do not stop using the antibiotic even if you start to feel better. General instructions Drink enough fluid to keep your pee (urine) pale  yellow. Do not touch or rub the infected area. Raise (elevate) the infected area above the level of your heart while you are sitting or lying down. Return to your normal activities as told by your provider. Ask your provider what activities are safe for you. Apply warm or cold compresses to the affected area as told by your provider. Keep all follow-up visits. Your provider will need to make sure that a more serious infection is not developing. Contact a health care provider if: You have a fever. Your symptoms do not improve within 1-2 days of starting treatment or you develop new symptoms. Your bone or joint underneath the infected area becomes painful after the skin has healed. Your infection returns in the same area or another area. Signs of this may include: You notice a swollen bump in the infected area. Your red area gets larger, turns dark in color, or becomes more painful. Drainage increases. Pus or a bad smell develops in your infected area. You have more pain. You feel ill and have muscle aches and weakness. You develop vomiting or diarrhea that will not  go away. Get help right away if: You notice red streaks coming from the infected area. You notice the skin turns purple or black and falls off. This symptom may be an emergency. Get help right away. Call 911. Do not wait to see if the symptom will go away. Do not drive yourself to the hospital. This information is not intended to replace advice given to you by your health care provider. Make sure you discuss any questions you have with your health care provider. Document Revised: 06/23/2022 Document Reviewed: 06/23/2022 Elsevier Patient Education  2024 Elsevier Inc.   If you have been instructed to have an in-person evaluation today at a local Urgent Care facility, please use the link below. It will take you to a list of all of our available [facility], including address, phone number and hours of operation.  Please do not delay care.  [facility]  If you or a family member do not have a primary care provider, use the link below to schedule a visit and establish care. When you choose a [facility] primary care physician or advanced practice provider, you gain a long-term partner in health. Find a Primary Care Provider  Learn more about [facility]'s in-office and virtual care options: [facility] - Get Care Now

## 2024-04-21 NOTE — Telephone Encounter (Signed)
 Patient call dropped prior to being connect to this nurse. Attempted  X2 to call patient back regarding her possible immunization reaction.  Call went straight to VM. Left a discrete message appropriately.

## 2024-04-21 NOTE — Telephone Encounter (Signed)
Awesome thank you!

## 2024-04-21 NOTE — Telephone Encounter (Signed)
 VBCI referral made 6/9 to pharmacy. I will attach pharmacy on call.
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
## 2024-04-24 ENCOUNTER — Other Ambulatory Visit: Payer: Self-pay

## 2024-04-24 NOTE — Progress Notes (Signed)
   04/24/2024  Patient ID: Paige Lozano, female   DOB: 03-02-1966, 58 y.o.   MRN: [id number]  Contacted patient's insurance to inquire about possibility of tier reduction for varenicline  1mg  BID, check on deductible status, and prescription payment plan options.    [employer] states patient has a $250 deductible that the patient has satisfied $158 so far.  Once this has been met copay for varenicline  will be $47 for a 28 day supply.  Plan does offer the option of a prescription payment plan, but patient will need to call customer service for details and to enroll if interested.  The plan also allows for a tier reduction request since this medication is a tier 3 drug.  Tier reduction request submitted over the phone, but status can be viewed through [email or web address] with PA ID [id number].  Decision will be made by 04/27/2024.  Plan was unable to inform me as to what the patient's copay would go down to if approved.  Linn Rich, PharmD, DPLA

## 2024-04-24 NOTE — Progress Notes (Unsigned)
   04/24/2024  Patient ID: Paige Lozano, female   DOB: November 25, 1965, 58 y.o.   MRN: [id number]  Outreach attempt for scheduled telephone follow-up visit unsuccessful, and I was not able to leave a voicemail.  Sending information regarding insurance coverage for varenicline  via MyChart message.  Linn Rich, PharmD, DPLA

## 2024-04-26 ENCOUNTER — Telehealth: Payer: Self-pay

## 2024-04-26 ENCOUNTER — Other Ambulatory Visit (HOSPITAL_COMMUNITY): Payer: Self-pay

## 2024-04-26 NOTE — Telephone Encounter (Signed)
 Pharmacy Patient Advocate Encounter   Received notification from Patient Pharmacy that prior authorization for Airsupra  90-58mcg is required/requested.   Insurance verification completed.   The patient is insured through [employer] .   Per test claim: PA required; PA submitted to above mentioned insurance via CoverMyMeds Key/confirmation #/EOC [id number] Status is pending

## 2024-04-26 NOTE — Telephone Encounter (Signed)
 Pharmacy Patient Advocate Encounter  Received notification from [employer] that tier exception for Varenicline  1mg  tabs has been DENIED.  Full denial letter will be uploaded to the media tab. See denial reason below.

## 2024-04-27 ENCOUNTER — Telehealth: Payer: Self-pay

## 2024-04-27 DIAGNOSIS — J432 Centrilobular emphysema: Secondary | ICD-10-CM | POA: Diagnosis not present

## 2024-04-27 DIAGNOSIS — K2100 Gastro-esophageal reflux disease with esophagitis, without bleeding: Secondary | ICD-10-CM | POA: Diagnosis not present

## 2024-04-27 DIAGNOSIS — K3189 Other diseases of stomach and duodenum: Secondary | ICD-10-CM | POA: Diagnosis not present

## 2024-04-27 DIAGNOSIS — K6389 Other specified diseases of intestine: Secondary | ICD-10-CM | POA: Diagnosis not present

## 2024-04-27 DIAGNOSIS — K5190 Ulcerative colitis, unspecified, without complications: Secondary | ICD-10-CM | POA: Diagnosis not present

## 2024-04-27 DIAGNOSIS — R634 Abnormal weight loss: Secondary | ICD-10-CM | POA: Diagnosis not present

## 2024-04-27 LAB — HM COLONOSCOPY

## 2024-04-27 NOTE — Telephone Encounter (Signed)
 PA for Varenicline  denied by the insurance. Patient informed of the determination from the insurance via a MyChart message.

## 2024-04-27 NOTE — Progress Notes (Signed)
   04/27/2024  Patient ID: Paige Lozano, female   DOB: 1966-04-07, 58 y.o.   MRN: [id number]  Tier reduction request denied by [facility], because they first require patient try clonidine .  Contacted [employer] to initiate appeal process based on the following potential DDI's: -Risk of CNS depression and hypotension when taking clonidine  with clonazepam , quetiapine , duloxetine   Expedited appeal request submitted, and determination will be known in 72 hours.  I will contact [employer] again tomorrow to see if decision for tier reduction has been made.  Contacted patient to provide update.  Linn Rich, PharmD, DPLA

## 2024-04-28 ENCOUNTER — Telehealth: Payer: Self-pay

## 2024-04-28 MED ORDER — VARENICLINE TARTRATE 1 MG PO TABS: 1.0000 mg | ORAL_TABLET | Freq: Two times a day (BID) | ORAL | 2 refills | Status: DC

## 2024-04-28 NOTE — Telephone Encounter (Signed)
 You are truly wonderful!

## 2024-04-28 NOTE — Progress Notes (Signed)
   04/28/2024  Patient ID: Paige Lozano, female   DOB: [birth year], 58 y.o.   MRN: [id number]  Tier reduction request appears to have been approved by [facility]; test claim for varenicline  1mg  BID is reflecting $0 copay now (previously coming back $49).  Prescription pending for provider to sign for 1 month supply with 2 refills.  Contacted patient to make her aware.  Linn Rich, PharmD, DPLA

## 2024-04-28 NOTE — Telephone Encounter (Signed)
 Pharmacy Patient Advocate Encounter  Received notification from [employer] that Prior Authorization for Airsupra  90-71mcg has been DENIED.  See denial reason below. No denial letter attached in CMM. Will attach denial letter to Media tab once received.   PA #/Case ID/Reference #: [id number]

## 2024-05-04 DIAGNOSIS — G35 Multiple sclerosis: Secondary | ICD-10-CM | POA: Diagnosis not present

## 2024-05-08 DIAGNOSIS — R200 Anesthesia of skin: Secondary | ICD-10-CM | POA: Diagnosis not present

## 2024-05-08 DIAGNOSIS — G35 Multiple sclerosis: Secondary | ICD-10-CM | POA: Diagnosis not present

## 2024-05-08 DIAGNOSIS — R5383 Other fatigue: Secondary | ICD-10-CM | POA: Diagnosis not present

## 2024-05-08 DIAGNOSIS — R413 Other amnesia: Secondary | ICD-10-CM | POA: Diagnosis not present

## 2024-05-17 DIAGNOSIS — Z9889 Other specified postprocedural states: Secondary | ICD-10-CM | POA: Diagnosis not present

## 2024-05-17 DIAGNOSIS — I890 Lymphedema, not elsewhere classified: Secondary | ICD-10-CM | POA: Diagnosis not present

## 2024-05-17 DIAGNOSIS — C4361 Malignant melanoma of right upper limb, including shoulder: Secondary | ICD-10-CM | POA: Diagnosis not present

## 2024-05-24 DIAGNOSIS — I890 Lymphedema, not elsewhere classified: Secondary | ICD-10-CM | POA: Diagnosis not present

## 2024-05-24 DIAGNOSIS — C4361 Malignant melanoma of right upper limb, including shoulder: Secondary | ICD-10-CM | POA: Diagnosis not present

## 2024-05-24 DIAGNOSIS — Z9889 Other specified postprocedural states: Secondary | ICD-10-CM | POA: Diagnosis not present

## 2024-05-25 DIAGNOSIS — S63637A Sprain of interphalangeal joint of left little finger, initial encounter: Secondary | ICD-10-CM | POA: Diagnosis not present

## 2024-05-25 DIAGNOSIS — M25521 Pain in right elbow: Secondary | ICD-10-CM | POA: Diagnosis not present

## 2024-05-25 DIAGNOSIS — M65341 Trigger finger, right ring finger: Secondary | ICD-10-CM | POA: Diagnosis not present

## 2024-06-06 DIAGNOSIS — G35 Multiple sclerosis: Secondary | ICD-10-CM | POA: Diagnosis not present

## 2024-06-07 DIAGNOSIS — R200 Anesthesia of skin: Secondary | ICD-10-CM | POA: Diagnosis not present

## 2024-06-07 DIAGNOSIS — R413 Other amnesia: Secondary | ICD-10-CM | POA: Diagnosis not present

## 2024-06-07 DIAGNOSIS — G35 Multiple sclerosis: Secondary | ICD-10-CM | POA: Diagnosis not present

## 2024-06-07 DIAGNOSIS — R5383 Other fatigue: Secondary | ICD-10-CM | POA: Diagnosis not present

## 2024-06-12 DIAGNOSIS — I890 Lymphedema, not elsewhere classified: Secondary | ICD-10-CM | POA: Diagnosis not present

## 2024-06-15 DIAGNOSIS — Z9889 Other specified postprocedural states: Secondary | ICD-10-CM | POA: Diagnosis not present

## 2024-06-15 DIAGNOSIS — I890 Lymphedema, not elsewhere classified: Secondary | ICD-10-CM | POA: Diagnosis not present

## 2024-06-15 DIAGNOSIS — C4361 Malignant melanoma of right upper limb, including shoulder: Secondary | ICD-10-CM | POA: Diagnosis not present

## 2024-06-19 DIAGNOSIS — I890 Lymphedema, not elsewhere classified: Secondary | ICD-10-CM | POA: Diagnosis not present

## 2024-07-06 DIAGNOSIS — Z4789 Encounter for other orthopedic aftercare: Secondary | ICD-10-CM | POA: Diagnosis not present

## 2024-07-06 DIAGNOSIS — M65341 Trigger finger, right ring finger: Secondary | ICD-10-CM | POA: Diagnosis not present

## 2024-07-11 ENCOUNTER — Encounter: Payer: Self-pay | Admitting: Sports Medicine

## 2024-07-11 DIAGNOSIS — G35 Multiple sclerosis: Secondary | ICD-10-CM | POA: Diagnosis not present

## 2024-07-14 ENCOUNTER — Ambulatory Visit: Admitting: Physician Assistant

## 2024-07-19 DIAGNOSIS — M6701 Short Achilles tendon (acquired), right ankle: Secondary | ICD-10-CM | POA: Diagnosis not present

## 2024-07-19 DIAGNOSIS — M19071 Primary osteoarthritis, right ankle and foot: Secondary | ICD-10-CM | POA: Diagnosis not present

## 2024-07-19 DIAGNOSIS — M2141 Flat foot [pes planus] (acquired), right foot: Secondary | ICD-10-CM | POA: Diagnosis not present

## 2024-07-19 DIAGNOSIS — M19072 Primary osteoarthritis, left ankle and foot: Secondary | ICD-10-CM | POA: Diagnosis not present

## 2024-07-19 DIAGNOSIS — M7742 Metatarsalgia, left foot: Secondary | ICD-10-CM | POA: Diagnosis not present

## 2024-07-19 DIAGNOSIS — M216X1 Other acquired deformities of right foot: Secondary | ICD-10-CM | POA: Diagnosis not present

## 2024-07-19 DIAGNOSIS — M2142 Flat foot [pes planus] (acquired), left foot: Secondary | ICD-10-CM | POA: Diagnosis not present

## 2024-07-19 DIAGNOSIS — M216X2 Other acquired deformities of left foot: Secondary | ICD-10-CM | POA: Diagnosis not present

## 2024-07-19 DIAGNOSIS — M2042 Other hammer toe(s) (acquired), left foot: Secondary | ICD-10-CM | POA: Diagnosis not present

## 2024-07-20 DIAGNOSIS — I890 Lymphedema, not elsewhere classified: Secondary | ICD-10-CM | POA: Diagnosis not present

## 2024-07-20 DIAGNOSIS — R5383 Other fatigue: Secondary | ICD-10-CM | POA: Diagnosis not present

## 2024-07-20 DIAGNOSIS — G35 Multiple sclerosis: Secondary | ICD-10-CM | POA: Diagnosis not present

## 2024-07-20 DIAGNOSIS — Z760 Encounter for issue of repeat prescription: Secondary | ICD-10-CM | POA: Diagnosis not present

## 2024-07-25 ENCOUNTER — Ambulatory Visit: Admitting: Physician Assistant

## 2024-08-05 ENCOUNTER — Other Ambulatory Visit: Payer: Self-pay | Admitting: Physician Assistant

## 2024-08-16 DIAGNOSIS — I890 Lymphedema, not elsewhere classified: Secondary | ICD-10-CM | POA: Diagnosis not present

## 2024-08-18 DIAGNOSIS — G35D Multiple sclerosis, unspecified: Secondary | ICD-10-CM | POA: Diagnosis not present

## 2024-09-06 ENCOUNTER — Ambulatory Visit: Payer: Self-pay

## 2024-09-06 NOTE — Telephone Encounter (Signed)
 FYI Only or Action Required?: FYI only for provider: appointment scheduled on 10/31.  Patient was last seen in primary care on 04/21/2024 by Vivienne Delon HERO, PA-C.  Called Nurse Triage reporting Hip Pain.  Symptoms began about a month ago.  Interventions attempted: OTC medications: Ibuprofen , Tylenol  3.  Symptoms are: unchanged.  Triage Disposition: See PCP When Office is Open (Within 3 Days)  Patient/caregiver understands and will follow disposition?: Yes         Copied from CRM [id number]. Topic: Clinical - Red Word Triage >> Sep 06, 2024 11:02 AM Larissa RAMAN wrote: [facility] that prompted transfer to Nurse Triage: rt hip and groin pain, difficulty walking Reason for Disposition  [1] MODERATE pain (e.g., interferes with normal activities, limping) AND [2] present > 3 days  Answer Assessment - Initial Assessment Questions 1. LOCATION and RADIATION: Where is the pain located? Does the pain spread (shoot) anywhere else?     right hip, groin, pelvis pain, patient does have MS. She states the pain is persistent, and she hears a click when walking.   2. QUALITY: What does the pain feel like?  (e.g., sharp, dull, aching, burning)     Pressure  3. SEVERITY: How bad is the pain? What does it keep you from doing?   (Scale 1-10; or mild, moderate, severe)     6/10  4. ONSET: When did the pain start? Does it come and go, or is it there all the time?     X 1 month   5. WORK OR EXERCISE: Has there been any recent work or exercise that involved this part of the body?      No   6. CAUSE: What do you think is causing the hip pain?      Has MS diagnosis   7. AGGRAVATING FACTORS: What makes the hip pain worse? (e.g., walking, climbing stairs, running)     Ambulation   8. OTHER SYMPTOMS: Do you have any other symptoms? (e.g., back pain, pain shooting down leg,  fever, rash) No   Patient has an upcomming surgery on 09/12/24 on her left foot. Patient is being  followed by neurology; which is managing her pain. Patient is requesting an appointment with a provider for evaluation of symptoms.  Protocols used: Hip Pain-A-AH

## 2024-09-06 NOTE — Telephone Encounter (Signed)
 Patient scheduled for 09/08/2024 with Jade Breeback,PA

## 2024-09-08 ENCOUNTER — Ambulatory Visit: Admitting: Physician Assistant

## 2024-09-11 ENCOUNTER — Ambulatory Visit: Admitting: Physician Assistant

## 2024-09-11 DIAGNOSIS — F411 Generalized anxiety disorder: Secondary | ICD-10-CM | POA: Diagnosis not present

## 2024-09-11 DIAGNOSIS — F319 Bipolar disorder, unspecified: Secondary | ICD-10-CM | POA: Diagnosis not present

## 2024-09-11 DIAGNOSIS — D84821 Immunodeficiency due to drugs: Secondary | ICD-10-CM | POA: Diagnosis not present

## 2024-09-11 DIAGNOSIS — Z91018 Allergy to other foods: Secondary | ICD-10-CM | POA: Diagnosis not present

## 2024-09-11 DIAGNOSIS — M2040 Other hammer toe(s) (acquired), unspecified foot: Secondary | ICD-10-CM | POA: Diagnosis not present

## 2024-09-11 DIAGNOSIS — G35D Multiple sclerosis, unspecified: Secondary | ICD-10-CM | POA: Diagnosis not present

## 2024-09-11 DIAGNOSIS — K219 Gastro-esophageal reflux disease without esophagitis: Secondary | ICD-10-CM | POA: Diagnosis not present

## 2024-09-11 DIAGNOSIS — Z86718 Personal history of other venous thrombosis and embolism: Secondary | ICD-10-CM | POA: Diagnosis not present

## 2024-09-11 DIAGNOSIS — Z85820 Personal history of malignant melanoma of skin: Secondary | ICD-10-CM | POA: Diagnosis not present

## 2024-09-11 DIAGNOSIS — R030 Elevated blood-pressure reading, without diagnosis of hypertension: Secondary | ICD-10-CM | POA: Diagnosis not present

## 2024-09-11 DIAGNOSIS — J439 Emphysema, unspecified: Secondary | ICD-10-CM | POA: Diagnosis not present

## 2024-09-11 DIAGNOSIS — K5190 Ulcerative colitis, unspecified, without complications: Secondary | ICD-10-CM | POA: Diagnosis not present

## 2024-09-11 DIAGNOSIS — Z86711 Personal history of pulmonary embolism: Secondary | ICD-10-CM | POA: Diagnosis not present

## 2024-09-11 DIAGNOSIS — G629 Polyneuropathy, unspecified: Secondary | ICD-10-CM | POA: Diagnosis not present

## 2024-09-11 DIAGNOSIS — F909 Attention-deficit hyperactivity disorder, unspecified type: Secondary | ICD-10-CM | POA: Diagnosis not present

## 2024-09-11 DIAGNOSIS — M1990 Unspecified osteoarthritis, unspecified site: Secondary | ICD-10-CM | POA: Diagnosis not present

## 2024-09-12 DIAGNOSIS — E785 Hyperlipidemia, unspecified: Secondary | ICD-10-CM | POA: Diagnosis not present

## 2024-09-12 DIAGNOSIS — J45909 Unspecified asthma, uncomplicated: Secondary | ICD-10-CM | POA: Diagnosis not present

## 2024-09-12 DIAGNOSIS — Z87891 Personal history of nicotine dependence: Secondary | ICD-10-CM | POA: Diagnosis not present

## 2024-09-12 DIAGNOSIS — Z7951 Long term (current) use of inhaled steroids: Secondary | ICD-10-CM | POA: Diagnosis not present

## 2024-09-12 DIAGNOSIS — G4733 Obstructive sleep apnea (adult) (pediatric): Secondary | ICD-10-CM | POA: Diagnosis not present

## 2024-09-12 DIAGNOSIS — F411 Generalized anxiety disorder: Secondary | ICD-10-CM | POA: Diagnosis not present

## 2024-09-12 DIAGNOSIS — F3181 Bipolar II disorder: Secondary | ICD-10-CM | POA: Diagnosis not present

## 2024-09-12 DIAGNOSIS — M2042 Other hammer toe(s) (acquired), left foot: Secondary | ICD-10-CM | POA: Diagnosis not present

## 2024-09-12 DIAGNOSIS — K219 Gastro-esophageal reflux disease without esophagitis: Secondary | ICD-10-CM | POA: Diagnosis not present

## 2024-09-14 NOTE — Progress Notes (Unsigned)
 Paige Lozano

## 2024-09-26 DIAGNOSIS — G35D Multiple sclerosis, unspecified: Secondary | ICD-10-CM | POA: Diagnosis not present

## 2024-09-27 DIAGNOSIS — Z4789 Encounter for other orthopedic aftercare: Secondary | ICD-10-CM | POA: Diagnosis not present

## 2024-09-28 ENCOUNTER — Encounter

## 2024-10-04 ENCOUNTER — Ambulatory Visit: Payer: Self-pay

## 2024-10-04 MED ORDER — DULOXETINE HCL 30 MG PO CPEP: 30.0000 mg | ORAL_CAPSULE | Freq: Every day | ORAL | 1 refills | Status: DC

## 2024-10-04 NOTE — Telephone Encounter (Signed)
 FYI Only or Action Required?: Action required by provider: medication refill request.  Patient was last seen in primary care on 04/21/2024 by Vivienne Delon HERO, PA-C.  Called Nurse Triage reporting Depression and Medication Refill.  Symptoms began several days ago.  Interventions attempted: Nothing.  Symptoms are: unchanged.  Triage Disposition: See Physician Within 24 Hours  Patient/caregiver understands and will follow disposition?: No, wishes to speak with PCP   Copied from CRM #[id number]. Topic: Clinical - Red Word Triage >> Oct 04, 2024  3:45 PM Wess RAMAN wrote: Red Word that prompted transfer to Nurse Triage: Major foot surgery on 09/12/24 and out of medication for 10 days. Crying daily, unable to stop. The name of the medication is DULoxetine  (CYMBALTA ) 60 MG capsule, one capsule, twice daily. Pain in left foot and can't put pressure on it.  Pharmacy: [id number] - [place], [place] - [address] AT [facility] & [address] [place] [address] Phone: [phone] Fax: [phone] Hours: Not open 24 hours Reason for Disposition  [1] Depression AND [2] getting worse (e.g., sleeping poorly, less able to do activities of daily living)  Answer Assessment - Initial Assessment Questions No available appts. Advised BHUC/UC now and ED if symptoms worsen.  Patient requests Breeback PA to send prescription for Cymbalta  10 mg, informed patient that [facility] PA is out of office and no available appts.  Patient declines BHUC/UC/ED due to recent foot surgery, non wt bearing.  Patient been off of Cymbalta  10mg  for 10 days.  1. CONCERN: What happened that made you call today?     Foot surgery, financial concerns Been off medication for 10 days; pt crying on the phones 2. DEPRESSION SYMPTOM SCREENING: How are you feeling overall? (e.g., decreased energy, increased sleeping or difficulty sleeping, difficulty  concentrating, feelings of sadness, guilt, hopelessness, or worthlessness)     I'm not feeling well in own head, a lot of medical issue,bipolar  3. RISK OF HARM - SUICIDAL IDEATION:  Do you ever have thoughts of hurting or killing yourself?  (e.g., yes, no, no but preoccupation with thoughts about death)     no 4. RISK OF HARM - HOMICIDAL IDEATION:  Do you ever have thoughts of hurting or killing someone else?  (e.g., yes, no, no but preoccupation with thoughts about death)     no 5. FUNCTIONAL IMPAIRMENT: How have things been going for you overall? Have you had more difficulty than usual doing your normal daily activities?  (e.g., better, same, worse; self-care, school, work, interactions)     Been frustrating, bday tomorrow cancelled plans 6. SUPPORT: Who is with you now? Who do you live with? Do you have family or friends who you can talk to?      yes 7. THERAPIST: Do you have a counselor or therapist? If Yes, ask: What is their name?     no  10. OTHER: Do you have any other physical symptoms right now? (e.g., fever)       no  Protocols used: Depression-A-AH

## 2024-10-04 NOTE — Telephone Encounter (Signed)
 Requesting rx rf of Duloxetine  30mg   Last written 12/06/2023 Last OV 04/14/2024 Upcoming appt = none

## 2024-10-04 NOTE — Telephone Encounter (Signed)
 Called [facility], spoke with Gordy, informed patient requesting Cymbalta  10 mg prescription, been off for 10 days.  Gordy reports [facility] PA not in office, med was prescribed by psychologist need to contact for refills and no available appts today.

## 2024-10-09 ENCOUNTER — Telehealth: Payer: Self-pay | Admitting: Physician Assistant

## 2024-10-09 ENCOUNTER — Other Ambulatory Visit: Payer: Self-pay

## 2024-10-09 MED ORDER — DULOXETINE HCL 30 MG PO CPEP: 30.0000 mg | ORAL_CAPSULE | Freq: Every day | ORAL | 1 refills | Status: DC

## 2024-10-09 NOTE — Telephone Encounter (Unsigned)
 Copied from CRM [id number]. Topic: Clinical - Medication Refill >> Oct 09, 2024 11:30 AM Amy B wrote: Medication:  DULoxetine  (CYMBALTA ) 30 MG capsule  Has the patient contacted their pharmacy? Yes (Agent: If no, request that the patient contact the pharmacy for the refill. If patient does not wish to contact the pharmacy document the reason why and proceed with request.) (Agent: If yes, when and what did the pharmacy advise?)  This is the patient's preferred pharmacy:  [id number] - [place], [place] - [address] AT [facility] [address] [place] [address] Phone: [phone] Fax: [phone]  [facility] [address] [place] [address] Phone: [phone] Fax: [phone]  Is this the correct pharmacy for this prescription? Yes If no, delete pharmacy and type the correct one.   Has the prescription been filled recently? No  Is the patient out of the medication? Yes  Has the patient been seen for an appointment in the last year OR does the patient have an upcoming appointment? Yes  Can we respond through MyChart? Yes  Agent: Please be advised that Rx refills may take up to 3 business days. We ask that you follow-up with your pharmacy.

## 2024-10-09 NOTE — Telephone Encounter (Signed)
 This task has been completed as requested. Left a vm message for the patient regarding their refill request. No further action needed.

## 2024-10-20 ENCOUNTER — Telehealth: Payer: Self-pay

## 2024-10-20 DIAGNOSIS — Z1231 Encounter for screening mammogram for malignant neoplasm of breast: Secondary | ICD-10-CM

## 2024-10-20 NOTE — Telephone Encounter (Signed)
 Order placed, printed out and signed by Benton Gave who is covering for [facility]. Faxed to the phone number provided by patient.   [facility] [address], [place], [place] [address] Phone: [phone] Fax: [phone]

## 2024-10-20 NOTE — Telephone Encounter (Signed)
 Copied from CRM #[id number]. Topic: Clinical - Request for Lab/Test Order >> Oct 19, 2024  2:01 PM Wess RAMAN wrote: Reason for CRM: Patient would like her imaging order for annual mammogram sent to:  [facility] [address], [place], [place] [address] Phone: [phone] Fax: [phone]  Patient callback #: [phone]

## 2024-10-30 ENCOUNTER — Other Ambulatory Visit: Payer: Self-pay | Admitting: Physician Assistant

## 2024-10-30 DIAGNOSIS — F411 Generalized anxiety disorder: Secondary | ICD-10-CM

## 2024-10-30 NOTE — Telephone Encounter (Unsigned)
 Copied from CRM #[id number]. Topic: Clinical - Medication Refill >> Oct 30, 2024  1:57 PM Zebedee SAUNDERS wrote: Medication: clonazePAM  (KLONOPIN ) 0.5 MG tablet  Has the patient contacted their pharmacy? Yes (Agent: If no, request that the patient contact the pharmacy for the refill. If patient does not wish to contact the pharmacy document the reason why and proceed with request.) (Agent: If yes, when and what did the pharmacy advise?)  This is the patient's preferred pharmacy:  [facility] #[id number] - [place], [place] - [address] AT [facility] & [address] [place] [address] Phone: [phone] Fax: [phone]  Is this the correct pharmacy for this prescription? Yes If no, delete pharmacy and type the correct one.   Has the prescription been filled recently? Yes  Is the patient out of the medication? Yes  Has the patient been seen for an appointment in the last year OR does the patient have an upcoming appointment? Yes  Can we respond through MyChart? Yes  Agent: Please be advised that Rx refills may take up to 3 business days. We ask that you follow-up with your pharmacy.

## 2024-10-31 ENCOUNTER — Other Ambulatory Visit: Payer: Self-pay

## 2024-10-31 DIAGNOSIS — F411 Generalized anxiety disorder: Secondary | ICD-10-CM

## 2024-10-31 MED ORDER — CLONAZEPAM 0.5 MG PO TABS: 0.5000 mg | ORAL_TABLET | Freq: Two times a day (BID) | ORAL | 0 refills | Status: DC

## 2024-11-07 ENCOUNTER — Other Ambulatory Visit: Payer: Self-pay

## 2024-11-14 ENCOUNTER — Encounter: Payer: Self-pay | Admitting: Physician Assistant

## 2024-11-14 ENCOUNTER — Ambulatory Visit: Admitting: Physician Assistant

## 2024-11-14 VITALS — BP 148/77 | HR 85 | Ht 68.0 in | Wt 164.0 lb

## 2024-11-14 DIAGNOSIS — F411 Generalized anxiety disorder: Secondary | ICD-10-CM

## 2024-11-14 DIAGNOSIS — M2550 Pain in unspecified joint: Secondary | ICD-10-CM | POA: Diagnosis not present

## 2024-11-14 DIAGNOSIS — M79604 Pain in right leg: Secondary | ICD-10-CM | POA: Diagnosis not present

## 2024-11-14 DIAGNOSIS — M79605 Pain in left leg: Secondary | ICD-10-CM

## 2024-11-14 DIAGNOSIS — F17200 Nicotine dependence, unspecified, uncomplicated: Secondary | ICD-10-CM

## 2024-11-14 DIAGNOSIS — F332 Major depressive disorder, recurrent severe without psychotic features: Secondary | ICD-10-CM

## 2024-11-14 DIAGNOSIS — M25551 Pain in right hip: Secondary | ICD-10-CM | POA: Diagnosis not present

## 2024-11-14 DIAGNOSIS — G35D Multiple sclerosis, unspecified: Secondary | ICD-10-CM

## 2024-11-14 DIAGNOSIS — S6992XA Unspecified injury of left wrist, hand and finger(s), initial encounter: Secondary | ICD-10-CM

## 2024-11-14 DIAGNOSIS — M545 Low back pain, unspecified: Secondary | ICD-10-CM

## 2024-11-14 DIAGNOSIS — J432 Centrilobular emphysema: Secondary | ICD-10-CM

## 2024-11-14 MED ORDER — BREZTRI AEROSPHERE 160-9-4.8 MCG/ACT IN AERO: 2.0000 | INHALATION_SPRAY | Freq: Two times a day (BID) | RESPIRATORY_TRACT | 5 refills | Status: DC

## 2024-11-14 MED ORDER — METHOCARBAMOL 500 MG PO TABS: 500.0000 mg | ORAL_TABLET | Freq: Three times a day (TID) | ORAL | 3 refills | Status: AC

## 2024-11-14 MED ORDER — ALBUTEROL SULFATE HFA 108 (90 BASE) MCG/ACT IN AERS: 2.0000 | INHALATION_SPRAY | Freq: Four times a day (QID) | RESPIRATORY_TRACT | 2 refills | Status: AC | PRN

## 2024-11-14 MED ORDER — METHYLPREDNISOLONE ACETATE 40 MG/ML IJ SUSP
40.0000 mg | Freq: Once | INTRAMUSCULAR | Status: AC
  Administered 2024-11-14: 40 mg via INTRAMUSCULAR

## 2024-11-14 MED ORDER — VARENICLINE TARTRATE 1 MG PO TABS: 1.0000 mg | ORAL_TABLET | Freq: Two times a day (BID) | ORAL | 2 refills | Status: AC

## 2024-11-14 NOTE — Patient Instructions (Signed)
 Hip Bursitis  Hip bursitis is swelling of one or more fluid-filled sacs (bursae) in your hip joint. If the bursa becomes irritated, it can fill with extra fluid and become swollen. This condition can cause pain, and your symptoms may come and go over time. What are the causes? Repeated use of your hip muscles. Injury to the hip. Weak butt muscles. Bone spurs. Infection. In some cases, the cause may not be known. What increases the risk? Having a past hip injury or hip surgery. Having a condition, such as arthritis, gout, diabetes, or thyroid disease. Having spine problems. Having one leg that is shorter than the other. Running a lot or doing long-distance running. Playing sports where there is a risk of injury or falling, such as football, martial arts, or skiing. What are the signs or symptoms? Symptoms may come and go, and they often include: Pain in the hip or groin area. Pain may get worse when you move your hip. Tenderness and swelling of the hip. In rare cases, the bursa may become infected. If this happens, you may: Get a fever. Have warmth and redness in the hip area. How is this treated? This condition is treated by: Resting your hip. Icing your hip. Wrapping the hip area with an elastic bandage (compression wrap). Keeping the hip raised. Other treatments may include: Using crutches, a cane, or a walker. Medicines. Draining fluid out of the bursa. Surgery to take out a bursa. This is rare. Long-term treatment may include: Doing exercises to help your strength and flexibility. Lifestyle changes like losing weight to lessen the strain on your hip. Follow these instructions at home: Managing pain, stiffness, and swelling     If told, put ice on the painful area. To do this: Put ice in a plastic bag. Place a towel between your skin and the bag. Leave the ice on for 20 minutes, 2-3 times a day. Take off the ice if your skin turns bright red. This is very important.  If you cannot feel pain, heat, or cold, you have a greater risk of damage to the area. Raise your hip by putting a pillow under your hips while you lie down. Stop if you feel pain. If told, put heat on the affected area. Do this as often as told by your doctor. Use the heat source that your doctor recommends, such as a moist heat pack or a heating pad. Place a towel between your skin and the heat source. Leave the heat on for 20-30 minutes. Take off the heat if your skin turns bright red. This is very important. If you cannot feel pain, heat, or cold, you have a greater risk of getting burned. Activity Do not use your hip to support your body weight until your doctor says that you can. Use crutches, a cane, or a walker as told by your doctor. If the affected leg is one that you use to drive, ask your doctor if it is safe to drive. Rest and protect your hip as much as you can until you feel better. Return to your normal activities when your doctor says that it is safe. Do exercises as told by your doctor. General instructions Take over-the-counter and prescription medicines only as told by your doctor. Gently rub and stretch your injured area as often as is comfortable. Wear elastic bandages only as told by your doctor. If one of your legs is shorter than the other, get fitted for a shoe insert or orthotic. Keep a healthy  weight. Follow instructions from your doctor. Keep all follow-up visits. How is this prevented? Exercise regularly or as told by your doctor. Wear the right shoes for the sport you play and for daily activities. Warm up and stretch before being active. Cool down and stretch after being active. Take breaks often from repeated activity. Avoid activities that bother your hip or cause pain. Avoid sitting down for a long time. Where to find more information American Academy of Orthopaedic Surgeons: orthoinfo.aaos.org Contact a doctor if: You have a fever. You have new  symptoms. You have trouble walking or doing everyday activities. You have pain that gets worse or does not get better with medicine. The skin around your hip is red. You get a feeling of warmth in your hip area. Get help right away if: You cannot move your hip. You have very bad pain. You cannot control the muscles in your feet. Summary Hip bursitis is swelling of one or more fluid-filled sacs (bursae) in your hip joint. Symptoms often come and go over time. This condition is often treated by resting and icing the hip. It also may help to keep the area raised and wrapped in an elastic bandage. Other treatments may be needed. This information is not intended to replace advice given to you by your health care provider. Make sure you discuss any questions you have with your health care provider. Document Revised: 10/21/2021 Document Reviewed: 10/21/2021 Elsevier Patient Education  2024 [employer].

## 2024-11-15 MED ORDER — CLONAZEPAM 0.5 MG PO TABS: 0.5000 mg | ORAL_TABLET | Freq: Two times a day (BID) | ORAL | 5 refills | Status: AC

## 2024-11-15 NOTE — Progress Notes (Signed)
 "  Established Patient Office Visit  Subjective   Patient ID: Paige Lozano, female    DOB: 1966/03/27  Age: 59 y.o. MRN: [id number]  Chief Complaint  Patient presents with   Medical Management of Chronic Issues    HPI .SABRADiscussed the use of AI scribe software for clinical note transcription with the patient, who gave verbal consent to proceed.  History of Present Illness Paige Lozano is a 59 year old female who presents with worsening right hip pain.  Right hip pain - Severe right hip pain worsening over the past few months - Pain localized as a 'strip' along the hip area - Pain exacerbated by climbing stairs, requiring use of handrail for support - Currently recovering at boyfriend's house, which has stairs that worsen pain - History of major hip surgery with tendon cut and bone removal  Low back pain - Chronic low back pain - Previously referred to Comp Rehab but did not attend appointment  Respiratory symptoms - Shortness of breath present - Quit smoking and vaping for four to five months - Currently using albuterol  - Unable to afford Air Supra and not using Breztri  due to financial difficulties  Depression and anxiety - On duloxetine  60 mg twice daily, previously effective - Significant decline in mental health after being off duloxetine  for two weeks in December - On Seroquel  200 mg for sleep - On Adderall 5 mg  Psychosocial stressors - History of cancer surgery - Significant personal losses including death of daughter - Recent year marked by job loss and eviction threats  Counseling needs - Desires counseling, preferring online sessions due to insurance and financial constraints - Previously found counseling beneficial after mother's death - Seeking to re-engage in therapy to manage current life stressors    ROS See HPI.    Objective:     BP (!) 148/77   Pulse 85   Ht 5' 8 (1.727 m)   Wt 164 lb (74.4 kg)   LMP  (LMP Unknown)   SpO2 99%   BMI  24.94 kg/m  BP Readings from Last 3 Encounters:  11/14/24 (!) 148/77  04/14/24 125/64  02/08/24 (!) 145/74   Wt Readings from Last 3 Encounters:  11/14/24 164 lb (74.4 kg)  04/14/24 150 lb 1.6 oz (68.1 kg)  03/14/24 151 lb (68.5 kg)    Bursa Injection  Pre-operative Diagnosis: right greater trochanter injection  Post-operative Diagnosis: right greater trochanter injection  Indications: Diagnosis and treatment of symptomatic bursal effusion  Procedure Details   After a discussion of the risks and benefits with the patient (including the possibility that any manipulation of the bursa could introduce infection, worsening the current situation significantly), verbal consent was obtained for the procedure. The joint was prepped with alcohol wipe and used ethyl chloride to help numb the aread.  An 18 gauge needle was introduced into the right greater trochanter and injected with 40mg  of depomedrol and 9cc of lidocaine . The injection site was cleansed with topical isopropyl alcohol and a dressing was applied.  Complications:     Physical Exam Constitutional:      Appearance: Normal appearance.  HENT:     Head: Normocephalic.  Cardiovascular:     Rate and Rhythm: Normal rate and regular rhythm.  Pulmonary:     Effort: Pulmonary effort is normal.     Breath sounds: Normal breath sounds.  Musculoskeletal:     Right lower leg: No edema.     Left lower leg: No edema.  Comments: Right sided greater trochanter tenderness to palpation.   Neurological:     General: No focal deficit present.     Mental Status: She is alert and oriented to person, place, and time.  Psychiatric:        Mood and Affect: Mood normal.        Assessment & Plan:  .Jeneal was seen today for medical management of chronic issues.  Diagnoses and all orders for this visit:  Severe episode of recurrent major depressive disorder, without psychotic features (HCC) -     Ambulatory referral to  Psychology  Anxiety state -     clonazePAM  (KLONOPIN ) 0.5 MG tablet; Take 1 tablet (0.5 mg total) by mouth in the morning and at bedtime. -     Ambulatory referral to Psychology  Right hip pain -     methylPREDNISolone  acetate (DEPO-MEDROL ) injection 40 mg -     Ambulatory referral to Orthopedic Surgery  Arthralgia of multiple joints -     Ambulatory referral to Orthopedic Surgery  Multiple sclerosis  Centrilobular emphysema (HCC) -     budesonide-glycopyrrolate -formoterol (BREZTRI  AEROSPHERE) 160-9-4.8 MCG/ACT AERO inhaler; Inhale 2 puffs into the lungs 2 (two) times daily. -     albuterol  (VENTOLIN  HFA) 108 (90 Base) MCG/ACT inhaler; Inhale 2 puffs into the lungs every 6 (six) hours as needed for wheezing or shortness of breath.  Injury of left wrist, initial encounter -     methocarbamol  (ROBAXIN ) 500 MG tablet; Take 1 tablet (500 mg total) by mouth 3 (three) times daily.  Tobacco dependence -     varenicline  (CHANTIX ) 1 MG tablet; Take 1 tablet (1 mg total) by mouth 2 (two) times daily. TAKE 1 TABLET(1 MG) BY MOUTH TWICE DAILY  Low back pain radiating to both legs -     Ambulatory referral to Orthopedic Surgery    Assessment & Plan Right hip pain due to trochanteric bursitis Excruciating right hip pain likely due to trochanteric bursitis, exacerbated by stair climbing. Recent hip surgery may contribute. Differential includes muscle strain or referred pain from low back. - Administered corticosteroid injection to the bursa area of the right hip. - Referred to Comp Rehab for further evaluation and management of hip pain.  Centrilobular emphysema Reports shortness of breath. Not smoking or vaping, but uses medical marijuana. Unable to afford Air Supra inhaler. - Prescribed a regular inhaler for shortness of breath. - Discussed the importance of smoking cessation and potential impact of medical marijuana on lung health.  Major depressive disorder, recurrent, severe Severe  depressive symptoms exacerbated by recent stressors, including cancer diagnosis and job loss. On duloxetine  60 mg twice daily, maximum dose. Interested in counseling. - Referred to [facility] for virtual counseling sessions. - Continue duloxetine  60 mg twice daily.  Generalized anxiety disorder Anxiety symptoms likely exacerbated by recent life events and stressors. On clonazepam  0.5 mg oral bid. - Continue clonazepam  0.5 mg oral bid.  Nicotine  dependence Using varenicline  for smoking cessation. Acknowledges difficulty quitting nicotine  and desires to continue varenicline . - Continue varenicline  for smoking cessation.  Low back pain Chronic low back pain possibly related to previous hip surgery and current hip pain. - Referred to Comp Rehab for evaluation and management of low back pain.     Anishka Bushard, PA-C  "

## 2024-11-17 ENCOUNTER — Other Ambulatory Visit: Payer: Self-pay | Admitting: Physician Assistant

## 2024-11-17 DIAGNOSIS — S6992XA Unspecified injury of left wrist, hand and finger(s), initial encounter: Secondary | ICD-10-CM

## 2024-11-23 ENCOUNTER — Ambulatory Visit: Payer: Self-pay

## 2024-11-23 NOTE — Telephone Encounter (Signed)
" ° °  FYI Only or Action Required?: Action required by provider: referral request and clinical question for provider.  Patient was last seen in primary care on 11/14/2024 by Antoniette Vermell CROME, PA-C.  Called Nurse Triage reporting Depression.  Symptoms began today.  Interventions attempted: Prescription medications: Alprazolam .  Symptoms are: gradually worsening.  Triage Disposition: See Physician Within 24 Hours  Patient/caregiver understands and will follow disposition?: No, wishes to speak with PCP   Copied from CRM #[id number]. Topic: Clinical - Red Word Triage >> Nov 23, 2024  2:32 PM Victoria B wrote: [facility] that prompted transfer to Nurse Triage: Patient has depression and severe pain in back Reason for Disposition  [1] Depression AND [2] getting worse (e.g., sleeping poorly, less able to do activities of daily living)  Answer Assessment - Initial Assessment Questions 1. CONCERN: What happened that made you call today?     New relationship, post surgery. Upset that social security did not give her a check. Water and internet went off.   2. DEPRESSION SYMPTOM SCREENING: How are you feeling overall? (e.g., decreased energy, increased sleeping or difficulty sleeping, difficulty concentrating, feelings of sadness, guilt, hopelessness, or worthlessness)     Sadness and guilt  3. RISK OF HARM - SUICIDAL IDEATION:  Do you ever have thoughts of hurting or killing yourself?  (e.g., yes, no, no but preoccupation with thoughts about death)     Denies SI  4. RISK OF HARM - HOMICIDAL IDEATION:  Do you ever have thoughts of hurting or killing someone else?  (e.g., yes, no, no but preoccupation with thoughts about death)     Denies HI  5. FUNCTIONAL IMPAIRMENT: How have things been going for you overall? Have you had more difficulty than usual doing your normal daily activities?  (e.g., better, same, worse; self-care, school, work, interactions)      Functional, still working  6.  SUPPORT: Who is with you now? Who do you live with? Do you have family or friends who you can talk to?       Boyfriend helps   7. THERAPIST: Do you have a counselor or therapist? If Yes, ask: What is their name?      Waiting a referral for counseling  8. STRESSORS: Has there been any new stress or recent changes in your life?      Financial hardship   9. OTHER: Do you have any other physical symptoms right now? (e.g., fever)        Pt c/o Back pain  Pt reports depression and back pain Pt is taking Alprazolam  988 resource given Pt requesting to have a referral to a therapist. Routing to clinic to assist with request Pt agrees with plan of care, will call back for any worsening symptoms  Protocols used: Depression-A-AH  "

## 2024-11-23 NOTE — Telephone Encounter (Signed)
Pended referral to [facility]

## 2024-12-05 NOTE — Telephone Encounter (Signed)
 Spoke with patient. She was requesting that the referral be sent to [facility] in: [facility] Address: [address], [place], [place] [address] Phone: [phone]  But she states was sent to the wrong facility - she states should be located on [address]- States this should first be a visit with the spine doctor and then would possibly go to PT  but the referral is not for PT.   Forwarding the message to both the referral team and Jade breeback as well.

## 2024-12-07 ENCOUNTER — Encounter: Payer: Self-pay | Admitting: Medical-Surgical

## 2024-12-07 ENCOUNTER — Ambulatory Visit

## 2024-12-07 ENCOUNTER — Ambulatory Visit (INDEPENDENT_AMBULATORY_CARE_PROVIDER_SITE_OTHER): Admitting: Medical-Surgical

## 2024-12-07 VITALS — BP 146/79 | HR 80 | Resp 20 | Ht 68.0 in | Wt 166.1 lb

## 2024-12-07 DIAGNOSIS — Z716 Tobacco abuse counseling: Secondary | ICD-10-CM

## 2024-12-07 DIAGNOSIS — M2548 Effusion, other site: Secondary | ICD-10-CM | POA: Diagnosis not present

## 2024-12-07 DIAGNOSIS — M79605 Pain in left leg: Secondary | ICD-10-CM

## 2024-12-07 DIAGNOSIS — F411 Generalized anxiety disorder: Secondary | ICD-10-CM | POA: Diagnosis not present

## 2024-12-07 DIAGNOSIS — M79644 Pain in right finger(s): Secondary | ICD-10-CM

## 2024-12-07 DIAGNOSIS — M79604 Pain in right leg: Secondary | ICD-10-CM | POA: Diagnosis not present

## 2024-12-07 DIAGNOSIS — F332 Major depressive disorder, recurrent severe without psychotic features: Secondary | ICD-10-CM

## 2024-12-07 DIAGNOSIS — M545 Low back pain, unspecified: Secondary | ICD-10-CM | POA: Diagnosis not present

## 2024-12-07 MED ORDER — PREDNISONE 50 MG PO TABS: 50.0000 mg | ORAL_TABLET | Freq: Every day | ORAL | 0 refills | Status: AC

## 2024-12-07 NOTE — Patient Instructions (Signed)
 We recognize that you are likely looking forward to scheduling your appointment.  Your referral has been sent to the appropriate office for scheduling. Usually, it takes 3-5 business days to process referrals once they are received.  Please allow this time for the office handling your referral to contact you directly to arrange your appointment.  If you have not been contacted within 5 business days, please call [phone] to make an appointment.            Information for Referral #: [id number]   Diagnoses:   M25.551 (ICD-10-CM) - Right hip pain   M25.50 (ICD-10-CM) - Arthralgia of multiple joints   M54.50,M79.604,M79.605 (ICD-10-CM) - Low back pain radiating to both legs   Procedures: [id number] (Custom) - AMB REFERRAL TO ORTHOPEDIC SURGERY Authorization #:     Referring Provider Information Vermell LITTIE Bologna [address] [place] [address]  [phone] Referring To Provider Information [facility] [address] [place] [address] [phone]  Referral Start Date: 11/27/2024 Referral End Date: 11/27/2025

## 2024-12-07 NOTE — Progress Notes (Signed)
 "       Established patient visit   History of Present Illness   Discussed the use of AI scribe software for clinical note transcription with the patient, who gave verbal consent to proceed.  History of Present Illness   Paige Lozano is a 59 year old female with multiple sclerosis who presents with a swollen right ring finger.  Right ring finger swelling and pain - Atraumatic swelling of the right ring finger for approximately four days - Associated with pain, difficulty gripping, and difficulty bending the finger - Interferes with writing and daily tasks - Partial temporary relief with ibuprofen  800 mg twice daily and Tylenol  - Right-handed  Multiple sclerosis-related symptoms - Multiple sclerosis has not typically involved the hands - Frequent minor injuries related to multiple sclerosis - Concern that multiple sclerosis may be contributing to current symptoms  Barriers to care and medication access - Uses Chantix  for smoking cessation through a patient assistance program that has expired - Unable to afford $90 monthly cost for Chantix  - Needs assistance with re-enrollment in the patient assistance program - Disabled and unable to drive frequently, limiting access to care  Behavioral health needs - Seeking counseling services - Prefers virtual visits - Desires options closer to [facility] due to disability and difficulty traveling  Back pain - Spoke with PCP and requested a referral to a spine specialist - Has heard from physical therapy but not the spine specialists office  Physical Exam   Physical Exam Vitals reviewed.  Constitutional:      General: She is not in acute distress.    Appearance: Normal appearance. She is not ill-appearing.  HENT:     Head: Normocephalic and atraumatic.  Cardiovascular:     Rate and Rhythm: Normal rate and regular rhythm.     Pulses: Normal pulses.     Heart sounds: Normal heart sounds. No murmur heard.    No friction rub. No  gallop.  Pulmonary:     Effort: Pulmonary effort is normal. No respiratory distress.     Breath sounds: Normal breath sounds. No wheezing.  Musculoskeletal:     Right hand: Tenderness (right index finger DIP joint) and bony tenderness (right index finger DIP joint) present. Decreased range of motion (right index finger DIP joint).  Skin:    General: Skin is warm and dry.  Neurological:     Mental Status: She is alert and oriented to person, place, and time.  Psychiatric:        Mood and Affect: Mood normal.        Behavior: Behavior normal.        Thought Content: Thought content normal.        Judgment: Judgment normal.    Assessment & Plan   Pain and swelling of right index finger Swelling and pain in the right ring finger for four days, affecting daily activities. Differential diagnosis includes inflammatory arthritis and gout. Ibuprofen  and Tylenol  provide limited relief. - Ordered x-ray of right ring finger. - Prescribed prednisone  50 mg daily for 5 days. - Provided finger brace to immobilize the finger.  Nicotine  dependence, cigarettes Difficulty affording Chantix  due to expired patient assistance program. Completed last dose of Chantix . - Will contact Channing Mealing to assist with re-enrollment in patient assistance program for Chantix .  Generalized anxiety disorder/Severe episode of recurrent major depressive disorder, without psychotic features (HCC)  Desires counseling services. Prefers virtual sessions due to disability and inability to travel. - Referred to behavioral health in [place] for virtual counseling services.  Back pain, referred for spine evaluation Referral to spine specialist at [facility] has been made. Awaiting contact from the spine specialist's office. Physical therapy is on hold until spine evaluation is completed. - Provided contact information for spine specialist's office to schedule appointment. - Advised to follow up  with spine specialist's office if not contacted within five business days.        Follow up   Return if symptoms worsen or fail to improve. __________________________________ Zada FREDRIK Palin, DNP, APRN, FNP-BC Primary Care and Sports Medicine [facility] [place] "

## 2024-12-08 NOTE — Progress Notes (Signed)
" ° °  12/08/2024  Patient ID: Paige Lozano, female   DOB: [birth year], 59 y.o.   MRN: [id number]  Clinic routed request to assist patient with affordability of varenicline  1mg  BID.  Tier reduction was approved through [facility] in June of 2025, which took her copay down to $0.  Patient informed provider this week that copay was going to be $90 to refill this month.  Contacted [employer] to initiate request for tier reduction (likely that this ended at [facility]).  Request submitted successfully, and a decision should be made within 72 hours.  Status can be checked at [email or web address] ref # [id number].  I will check on status Monday and follow-up with patient.  Channing DELENA Mealing, PharmD, DPLA  "

## 2024-12-11 ENCOUNTER — Ambulatory Visit: Payer: Self-pay | Admitting: Medical-Surgical

## 2024-12-11 ENCOUNTER — Telehealth: Payer: Self-pay

## 2024-12-11 DIAGNOSIS — M79644 Pain in right finger(s): Secondary | ICD-10-CM

## 2024-12-14 ENCOUNTER — Other Ambulatory Visit (HOSPITAL_COMMUNITY): Payer: Self-pay
